# Patient Record
Sex: Male | Born: 1950 | Race: Black or African American | Hispanic: No | Smoking: Former smoker
Health system: Southern US, Community
[De-identification: ages and names within clinical notes are randomized; demographics above are authoritative.]

## PROBLEM LIST (undated history)

## (undated) DIAGNOSIS — M79673 Pain in unspecified foot: Secondary | ICD-10-CM

## (undated) DIAGNOSIS — F1010 Alcohol abuse, uncomplicated: Secondary | ICD-10-CM

## (undated) DIAGNOSIS — I739 Peripheral vascular disease, unspecified: Secondary | ICD-10-CM

## (undated) DIAGNOSIS — M25519 Pain in unspecified shoulder: Secondary | ICD-10-CM

## (undated) DIAGNOSIS — R05 Cough: Secondary | ICD-10-CM

## (undated) DIAGNOSIS — Z227 Latent tuberculosis: Secondary | ICD-10-CM

## (undated) DIAGNOSIS — R053 Chronic cough: Secondary | ICD-10-CM

## (undated) DIAGNOSIS — M129 Arthropathy, unspecified: Secondary | ICD-10-CM

## (undated) DIAGNOSIS — M545 Low back pain, unspecified: Secondary | ICD-10-CM

## (undated) DIAGNOSIS — I10 Essential (primary) hypertension: Secondary | ICD-10-CM

## (undated) DIAGNOSIS — E785 Hyperlipidemia, unspecified: Secondary | ICD-10-CM

## (undated) HISTORY — DX: Low back pain: M54.5

## (undated) HISTORY — DX: Cough: R05

## (undated) HISTORY — DX: Chronic cough: R05.3

## (undated) HISTORY — DX: Latent tuberculosis: Z22.7

## (undated) HISTORY — DX: Hyperlipidemia, unspecified: E78.5

## (undated) HISTORY — DX: Pain in unspecified foot: M79.673

## (undated) HISTORY — PX: LAMINECTOMY: SHX219

## (undated) HISTORY — DX: Peripheral vascular disease, unspecified: I73.9

## (undated) HISTORY — DX: Low back pain, unspecified: M54.50

## (undated) HISTORY — DX: Arthropathy, unspecified: M12.9

## (undated) HISTORY — DX: Alcohol abuse, uncomplicated: F10.10

## (undated) HISTORY — PX: HAMMER TOE SURGERY: SHX385

## (undated) HISTORY — DX: Pain in unspecified shoulder: M25.519

## (undated) HISTORY — PX: APPENDECTOMY: SHX54

## (undated) HISTORY — DX: Essential (primary) hypertension: I10

---

## 1998-10-26 ENCOUNTER — Encounter: Payer: Self-pay | Admitting: Specialist

## 1998-10-26 ENCOUNTER — Encounter (INDEPENDENT_AMBULATORY_CARE_PROVIDER_SITE_OTHER): Payer: Self-pay | Admitting: Internal Medicine

## 1998-10-31 ENCOUNTER — Encounter (INDEPENDENT_AMBULATORY_CARE_PROVIDER_SITE_OTHER): Payer: Self-pay

## 1998-10-31 ENCOUNTER — Encounter: Payer: Self-pay | Admitting: Specialist

## 1998-10-31 ENCOUNTER — Observation Stay (HOSPITAL_COMMUNITY): Admission: RE | Admit: 1998-10-31 | Discharge: 1998-11-01 | Payer: Self-pay | Admitting: Specialist

## 1999-03-28 ENCOUNTER — Encounter: Payer: Self-pay | Admitting: Specialist

## 1999-03-28 ENCOUNTER — Observation Stay (HOSPITAL_COMMUNITY): Admission: RE | Admit: 1999-03-28 | Discharge: 1999-03-29 | Payer: Self-pay | Admitting: Specialist

## 1999-03-28 ENCOUNTER — Encounter (INDEPENDENT_AMBULATORY_CARE_PROVIDER_SITE_OTHER): Payer: Self-pay

## 1999-10-25 ENCOUNTER — Ambulatory Visit (HOSPITAL_COMMUNITY): Admission: RE | Admit: 1999-10-25 | Discharge: 1999-10-25 | Payer: Self-pay | Admitting: Specialist

## 2002-06-08 ENCOUNTER — Encounter: Admission: RE | Admit: 2002-06-08 | Discharge: 2002-06-08 | Payer: Self-pay | Admitting: Internal Medicine

## 2002-06-11 ENCOUNTER — Encounter: Admission: RE | Admit: 2002-06-11 | Discharge: 2002-06-11 | Payer: Self-pay | Admitting: Internal Medicine

## 2002-07-08 ENCOUNTER — Encounter: Admission: RE | Admit: 2002-07-08 | Discharge: 2002-07-08 | Payer: Self-pay | Admitting: Internal Medicine

## 2002-10-20 ENCOUNTER — Encounter: Admission: RE | Admit: 2002-10-20 | Discharge: 2002-10-20 | Payer: Self-pay | Admitting: Internal Medicine

## 2003-01-12 ENCOUNTER — Encounter: Admission: RE | Admit: 2003-01-12 | Discharge: 2003-01-12 | Payer: Self-pay | Admitting: Internal Medicine

## 2003-02-04 ENCOUNTER — Encounter: Admission: RE | Admit: 2003-02-04 | Discharge: 2003-02-04 | Payer: Self-pay | Admitting: Internal Medicine

## 2003-06-17 ENCOUNTER — Encounter: Admission: RE | Admit: 2003-06-17 | Discharge: 2003-06-17 | Payer: Self-pay | Admitting: Internal Medicine

## 2003-10-01 ENCOUNTER — Ambulatory Visit: Payer: Self-pay | Admitting: Internal Medicine

## 2003-10-05 ENCOUNTER — Ambulatory Visit: Payer: Self-pay | Admitting: Internal Medicine

## 2004-01-26 ENCOUNTER — Ambulatory Visit (HOSPITAL_COMMUNITY): Admission: RE | Admit: 2004-01-26 | Discharge: 2004-01-26 | Payer: Self-pay | Admitting: Internal Medicine

## 2004-01-26 ENCOUNTER — Ambulatory Visit: Payer: Self-pay | Admitting: Internal Medicine

## 2004-02-29 ENCOUNTER — Ambulatory Visit: Payer: Self-pay | Admitting: Internal Medicine

## 2004-03-29 ENCOUNTER — Emergency Department (HOSPITAL_COMMUNITY): Admission: EM | Admit: 2004-03-29 | Discharge: 2004-03-30 | Payer: Self-pay | Admitting: Emergency Medicine

## 2004-04-24 ENCOUNTER — Encounter: Admission: RE | Admit: 2004-04-24 | Discharge: 2004-04-24 | Payer: Self-pay | Admitting: Family Medicine

## 2004-05-22 ENCOUNTER — Ambulatory Visit (HOSPITAL_COMMUNITY): Admission: RE | Admit: 2004-05-22 | Discharge: 2004-05-23 | Payer: Self-pay | Admitting: Orthopaedic Surgery

## 2004-07-04 ENCOUNTER — Ambulatory Visit: Payer: Self-pay | Admitting: Internal Medicine

## 2004-09-22 ENCOUNTER — Ambulatory Visit: Payer: Self-pay | Admitting: Internal Medicine

## 2005-06-27 ENCOUNTER — Ambulatory Visit: Payer: Self-pay | Admitting: Internal Medicine

## 2005-07-16 ENCOUNTER — Emergency Department (HOSPITAL_COMMUNITY): Admission: EM | Admit: 2005-07-16 | Discharge: 2005-07-16 | Payer: Self-pay | Admitting: Family Medicine

## 2005-08-03 ENCOUNTER — Ambulatory Visit: Payer: Self-pay | Admitting: Internal Medicine

## 2005-10-22 ENCOUNTER — Emergency Department (HOSPITAL_COMMUNITY): Admission: EM | Admit: 2005-10-22 | Discharge: 2005-10-22 | Payer: Self-pay | Admitting: Emergency Medicine

## 2006-01-11 ENCOUNTER — Encounter (INDEPENDENT_AMBULATORY_CARE_PROVIDER_SITE_OTHER): Payer: Self-pay | Admitting: *Deleted

## 2006-01-11 ENCOUNTER — Ambulatory Visit (HOSPITAL_COMMUNITY): Admission: RE | Admit: 2006-01-11 | Discharge: 2006-01-11 | Payer: Self-pay | Admitting: Internal Medicine

## 2006-01-11 ENCOUNTER — Ambulatory Visit: Payer: Self-pay | Admitting: Internal Medicine

## 2006-01-11 LAB — CONVERTED CEMR LAB
Basophils Absolute: 0 K/uL
Basophils Relative: 0 %
Eosinophils Relative: 0 %
HCT: 37.3 % — ABNORMAL LOW
Hemoglobin: 12.7 g/dL — ABNORMAL LOW
Lymphocytes Relative: 35 %
Lymphs Abs: 3.2 K/uL — ABNORMAL HIGH
MCHC: 34 g/dL
MCV: 89.4 fL
Monocytes Absolute: 0.8 K/uL — ABNORMAL HIGH
Monocytes Relative: 8 %
Neutro Abs: 5.2 K/uL
Neutrophils Relative %: 56 %
Platelets: 228 K/uL
RBC: 4.17 M/uL — ABNORMAL LOW
RDW: 16.9 % — ABNORMAL HIGH
TSH: 1.015 u[IU]/mL
WBC: 9.3 10*3/microliter

## 2006-01-18 ENCOUNTER — Encounter (INDEPENDENT_AMBULATORY_CARE_PROVIDER_SITE_OTHER): Payer: Self-pay | Admitting: *Deleted

## 2006-01-18 ENCOUNTER — Ambulatory Visit: Payer: Self-pay | Admitting: Internal Medicine

## 2006-01-18 LAB — CONVERTED CEMR LAB
Ferritin: 249 ng/mL (ref 22–322)
Retic Count, Absolute: 38.4 (ref 19.0–186.0)
Retic Ct Pct: 0.9 % (ref 0.4–3.1)
TIBC: 291 ug/dL (ref 215–435)

## 2006-06-12 ENCOUNTER — Encounter (INDEPENDENT_AMBULATORY_CARE_PROVIDER_SITE_OTHER): Payer: Self-pay | Admitting: Internal Medicine

## 2006-06-12 ENCOUNTER — Ambulatory Visit: Payer: Self-pay | Admitting: Internal Medicine

## 2006-06-12 ENCOUNTER — Ambulatory Visit (HOSPITAL_COMMUNITY): Admission: RE | Admit: 2006-06-12 | Discharge: 2006-06-12 | Payer: Self-pay | Admitting: Internal Medicine

## 2006-06-12 DIAGNOSIS — E785 Hyperlipidemia, unspecified: Secondary | ICD-10-CM

## 2006-06-12 DIAGNOSIS — R9431 Abnormal electrocardiogram [ECG] [EKG]: Secondary | ICD-10-CM

## 2006-06-12 DIAGNOSIS — I10 Essential (primary) hypertension: Secondary | ICD-10-CM

## 2006-06-12 DIAGNOSIS — M545 Low back pain: Secondary | ICD-10-CM

## 2006-06-14 ENCOUNTER — Ambulatory Visit (HOSPITAL_COMMUNITY): Admission: RE | Admit: 2006-06-14 | Discharge: 2006-06-14 | Payer: Self-pay | Admitting: Hospitalist

## 2006-06-14 ENCOUNTER — Encounter (INDEPENDENT_AMBULATORY_CARE_PROVIDER_SITE_OTHER): Payer: Self-pay | Admitting: Hospitalist

## 2006-06-26 ENCOUNTER — Telehealth (INDEPENDENT_AMBULATORY_CARE_PROVIDER_SITE_OTHER): Payer: Self-pay | Admitting: Pharmacy Technician

## 2006-06-29 ENCOUNTER — Emergency Department (HOSPITAL_COMMUNITY): Admission: EM | Admit: 2006-06-29 | Discharge: 2006-06-29 | Payer: Self-pay | Admitting: Family Medicine

## 2006-07-02 ENCOUNTER — Telehealth (INDEPENDENT_AMBULATORY_CARE_PROVIDER_SITE_OTHER): Payer: Self-pay | Admitting: *Deleted

## 2006-07-11 ENCOUNTER — Encounter (INDEPENDENT_AMBULATORY_CARE_PROVIDER_SITE_OTHER): Payer: Self-pay | Admitting: Internal Medicine

## 2006-07-11 ENCOUNTER — Ambulatory Visit: Payer: Self-pay | Admitting: Internal Medicine

## 2006-07-11 LAB — CONVERTED CEMR LAB
Cholesterol: 157 mg/dL (ref 0–200)
HDL: 32 mg/dL — ABNORMAL LOW (ref >39)
LDL Cholesterol: 97 mg/dL (ref 0–99)
Total CHOL/HDL Ratio: 4.9
Triglycerides: 141 mg/dL (ref <150)
VLDL: 28 mg/dL (ref 0–40)

## 2006-07-19 ENCOUNTER — Ambulatory Visit: Payer: Self-pay | Admitting: Internal Medicine

## 2006-07-23 ENCOUNTER — Ambulatory Visit: Payer: Self-pay | Admitting: Gastroenterology

## 2006-08-06 ENCOUNTER — Encounter (INDEPENDENT_AMBULATORY_CARE_PROVIDER_SITE_OTHER): Payer: Self-pay | Admitting: *Deleted

## 2006-08-06 ENCOUNTER — Ambulatory Visit: Payer: Self-pay | Admitting: Gastroenterology

## 2006-08-06 ENCOUNTER — Encounter: Payer: Self-pay | Admitting: Gastroenterology

## 2006-08-12 ENCOUNTER — Ambulatory Visit: Payer: Self-pay | Admitting: Hospitalist

## 2006-08-12 ENCOUNTER — Encounter (INDEPENDENT_AMBULATORY_CARE_PROVIDER_SITE_OTHER): Payer: Self-pay | Admitting: *Deleted

## 2006-08-12 LAB — CONVERTED CEMR LAB
CO2: 22 meq/L (ref 19–32)
Creatinine, Ser: 0.86 mg/dL (ref 0.40–1.50)
Sodium: 139 meq/L (ref 135–145)
Total Bilirubin: 0.3 mg/dL (ref 0.3–1.2)

## 2006-08-20 ENCOUNTER — Telehealth (INDEPENDENT_AMBULATORY_CARE_PROVIDER_SITE_OTHER): Payer: Self-pay | Admitting: Pharmacy Technician

## 2006-09-12 ENCOUNTER — Telehealth: Payer: Self-pay | Admitting: *Deleted

## 2006-10-31 ENCOUNTER — Telehealth (INDEPENDENT_AMBULATORY_CARE_PROVIDER_SITE_OTHER): Payer: Self-pay | Admitting: Hospitalist

## 2006-11-14 ENCOUNTER — Telehealth: Payer: Self-pay | Admitting: *Deleted

## 2006-11-15 ENCOUNTER — Telehealth: Payer: Self-pay | Admitting: *Deleted

## 2006-11-18 ENCOUNTER — Telehealth: Payer: Self-pay | Admitting: *Deleted

## 2006-12-10 ENCOUNTER — Ambulatory Visit (HOSPITAL_COMMUNITY): Admission: RE | Admit: 2006-12-10 | Discharge: 2006-12-10 | Payer: Self-pay | Admitting: *Deleted

## 2006-12-10 ENCOUNTER — Ambulatory Visit: Payer: Self-pay | Admitting: *Deleted

## 2006-12-10 DIAGNOSIS — I739 Peripheral vascular disease, unspecified: Secondary | ICD-10-CM

## 2006-12-12 ENCOUNTER — Telehealth: Payer: Self-pay | Admitting: *Deleted

## 2006-12-13 ENCOUNTER — Emergency Department (HOSPITAL_COMMUNITY): Admission: EM | Admit: 2006-12-13 | Discharge: 2006-12-13 | Payer: Self-pay | Admitting: Emergency Medicine

## 2006-12-17 ENCOUNTER — Ambulatory Visit: Payer: Self-pay | Admitting: Vascular Surgery

## 2006-12-17 ENCOUNTER — Ambulatory Visit (HOSPITAL_COMMUNITY): Admission: RE | Admit: 2006-12-17 | Discharge: 2006-12-17 | Payer: Self-pay | Admitting: *Deleted

## 2007-01-01 ENCOUNTER — Telehealth: Payer: Self-pay | Admitting: Infectious Diseases

## 2007-02-26 ENCOUNTER — Telehealth: Payer: Self-pay | Admitting: Infectious Disease

## 2007-05-02 ENCOUNTER — Telehealth (INDEPENDENT_AMBULATORY_CARE_PROVIDER_SITE_OTHER): Payer: Self-pay | Admitting: *Deleted

## 2007-06-02 ENCOUNTER — Ambulatory Visit (HOSPITAL_COMMUNITY): Admission: RE | Admit: 2007-06-02 | Discharge: 2007-06-02 | Payer: Self-pay | Admitting: Internal Medicine

## 2007-06-02 ENCOUNTER — Ambulatory Visit: Payer: Self-pay | Admitting: Internal Medicine

## 2007-06-02 ENCOUNTER — Encounter: Payer: Self-pay | Admitting: Internal Medicine

## 2007-06-02 DIAGNOSIS — R079 Chest pain, unspecified: Secondary | ICD-10-CM | POA: Insufficient documentation

## 2007-06-02 LAB — CONVERTED CEMR LAB: Relative Index: 0.8 (ref 0.0–2.5)

## 2007-06-06 ENCOUNTER — Ambulatory Visit (HOSPITAL_COMMUNITY): Admission: RE | Admit: 2007-06-06 | Discharge: 2007-06-06 | Payer: Self-pay | Admitting: Internal Medicine

## 2007-06-10 ENCOUNTER — Ambulatory Visit (HOSPITAL_COMMUNITY): Admission: RE | Admit: 2007-06-10 | Discharge: 2007-06-10 | Payer: Self-pay | Admitting: Internal Medicine

## 2007-06-10 ENCOUNTER — Encounter: Payer: Self-pay | Admitting: Internal Medicine

## 2007-06-17 ENCOUNTER — Telehealth (INDEPENDENT_AMBULATORY_CARE_PROVIDER_SITE_OTHER): Payer: Self-pay | Admitting: *Deleted

## 2007-07-15 ENCOUNTER — Ambulatory Visit: Payer: Self-pay | Admitting: Cardiology

## 2007-07-16 ENCOUNTER — Telehealth (INDEPENDENT_AMBULATORY_CARE_PROVIDER_SITE_OTHER): Payer: Self-pay | Admitting: *Deleted

## 2007-07-23 ENCOUNTER — Encounter (INDEPENDENT_AMBULATORY_CARE_PROVIDER_SITE_OTHER): Payer: Self-pay | Admitting: Internal Medicine

## 2007-07-23 ENCOUNTER — Ambulatory Visit: Payer: Self-pay

## 2007-07-23 ENCOUNTER — Encounter: Payer: Self-pay | Admitting: Internal Medicine

## 2007-07-27 ENCOUNTER — Emergency Department (HOSPITAL_COMMUNITY): Admission: EM | Admit: 2007-07-27 | Discharge: 2007-07-27 | Payer: Self-pay | Admitting: Emergency Medicine

## 2007-08-08 ENCOUNTER — Ambulatory Visit: Payer: Self-pay | Admitting: Infectious Diseases

## 2007-08-08 ENCOUNTER — Encounter (INDEPENDENT_AMBULATORY_CARE_PROVIDER_SITE_OTHER): Payer: Self-pay | Admitting: Internal Medicine

## 2007-08-08 LAB — CONVERTED CEMR LAB
ALT: 44 units/L (ref 0–53)
Albumin: 4.7 g/dL (ref 3.5–5.2)
BUN: 13 mg/dL (ref 6–23)
CO2: 23 meq/L (ref 19–32)
Calcium: 10 mg/dL (ref 8.4–10.5)
Chloride: 105 meq/L (ref 96–112)
Cholesterol: 181 mg/dL (ref 0–200)
Glucose, Bld: 101 mg/dL — ABNORMAL HIGH (ref 70–99)
LDL Cholesterol: 114 mg/dL — ABNORMAL HIGH (ref 0–99)
Total Bilirubin: 0.2 mg/dL — ABNORMAL LOW (ref 0.3–1.2)
Total Protein: 7.6 g/dL (ref 6.0–8.3)

## 2007-08-22 ENCOUNTER — Encounter (INDEPENDENT_AMBULATORY_CARE_PROVIDER_SITE_OTHER): Payer: Self-pay | Admitting: Internal Medicine

## 2007-08-22 ENCOUNTER — Ambulatory Visit: Payer: Self-pay | Admitting: Internal Medicine

## 2007-08-27 ENCOUNTER — Ambulatory Visit: Payer: Self-pay | Admitting: Cardiovascular Disease

## 2007-09-10 ENCOUNTER — Telehealth (INDEPENDENT_AMBULATORY_CARE_PROVIDER_SITE_OTHER): Payer: Self-pay | Admitting: Internal Medicine

## 2007-10-01 ENCOUNTER — Ambulatory Visit: Payer: Self-pay | Admitting: Cardiology

## 2007-10-27 ENCOUNTER — Emergency Department (HOSPITAL_COMMUNITY): Admission: EM | Admit: 2007-10-27 | Discharge: 2007-10-27 | Payer: Self-pay | Admitting: Emergency Medicine

## 2007-11-05 ENCOUNTER — Emergency Department (HOSPITAL_COMMUNITY): Admission: EM | Admit: 2007-11-05 | Discharge: 2007-11-05 | Payer: Self-pay | Admitting: Emergency Medicine

## 2007-11-24 ENCOUNTER — Emergency Department (HOSPITAL_COMMUNITY): Admission: EM | Admit: 2007-11-24 | Discharge: 2007-11-24 | Payer: Self-pay | Admitting: Family Medicine

## 2008-01-05 ENCOUNTER — Telehealth (INDEPENDENT_AMBULATORY_CARE_PROVIDER_SITE_OTHER): Payer: Self-pay | Admitting: Internal Medicine

## 2008-01-06 ENCOUNTER — Ambulatory Visit: Payer: Self-pay | Admitting: Cardiovascular Disease

## 2008-01-15 ENCOUNTER — Emergency Department (HOSPITAL_COMMUNITY): Admission: EM | Admit: 2008-01-15 | Discharge: 2008-01-15 | Payer: Self-pay | Admitting: Emergency Medicine

## 2008-08-31 ENCOUNTER — Telehealth (INDEPENDENT_AMBULATORY_CARE_PROVIDER_SITE_OTHER): Payer: Self-pay | Admitting: Internal Medicine

## 2009-03-09 ENCOUNTER — Telehealth (INDEPENDENT_AMBULATORY_CARE_PROVIDER_SITE_OTHER): Payer: Self-pay | Admitting: Internal Medicine

## 2009-03-24 ENCOUNTER — Ambulatory Visit: Payer: Self-pay | Admitting: Internal Medicine

## 2009-03-26 LAB — CONVERTED CEMR LAB
ALT: 16 units/L (ref 0–53)
Albumin: 4.7 g/dL (ref 3.5–5.2)
Alkaline Phosphatase: 66 units/L (ref 39–117)
Cholesterol: 173 mg/dL (ref 0–200)
Glucose, Bld: 85 mg/dL (ref 70–99)
HDL: 33 mg/dL — ABNORMAL LOW (ref >39)
Triglycerides: 120 mg/dL (ref <150)

## 2009-04-18 ENCOUNTER — Ambulatory Visit: Payer: Self-pay | Admitting: Internal Medicine

## 2009-04-18 LAB — CONVERTED CEMR LAB

## 2009-05-02 DIAGNOSIS — F1011 Alcohol abuse, in remission: Secondary | ICD-10-CM

## 2009-05-03 ENCOUNTER — Ambulatory Visit: Payer: Self-pay | Admitting: Cardiovascular Disease

## 2009-05-09 ENCOUNTER — Encounter: Payer: Self-pay | Admitting: Cardiovascular Disease

## 2009-05-09 ENCOUNTER — Ambulatory Visit: Payer: Self-pay

## 2009-05-10 ENCOUNTER — Encounter: Payer: Self-pay | Admitting: Cardiovascular Disease

## 2009-05-10 ENCOUNTER — Telehealth (INDEPENDENT_AMBULATORY_CARE_PROVIDER_SITE_OTHER): Payer: Self-pay | Admitting: *Deleted

## 2009-05-16 ENCOUNTER — Telehealth: Payer: Self-pay | Admitting: Cardiovascular Disease

## 2009-05-18 ENCOUNTER — Ambulatory Visit: Payer: Self-pay | Admitting: Internal Medicine

## 2009-06-01 ENCOUNTER — Telehealth (INDEPENDENT_AMBULATORY_CARE_PROVIDER_SITE_OTHER): Payer: Self-pay | Admitting: Internal Medicine

## 2009-06-24 ENCOUNTER — Ambulatory Visit: Payer: Self-pay | Admitting: Infectious Disease

## 2009-06-28 LAB — CONVERTED CEMR LAB
AST: 15 units/L (ref 0–37)
Alkaline Phosphatase: 64 units/L (ref 39–117)
CO2: 19 meq/L (ref 19–32)
Chloride: 105 meq/L (ref 96–112)
Cholesterol: 144 mg/dL (ref 0–200)
Creatinine, Ser: 0.93 mg/dL (ref 0.40–1.50)
Glucose, Bld: 90 mg/dL (ref 70–99)
HDL: 39 mg/dL — ABNORMAL LOW (ref >39)
Total Bilirubin: 0.4 mg/dL (ref 0.3–1.2)
VLDL: 16 mg/dL (ref 0–40)

## 2009-07-05 ENCOUNTER — Ambulatory Visit: Payer: Self-pay | Admitting: Family Medicine

## 2009-07-05 DIAGNOSIS — M7500 Adhesive capsulitis of unspecified shoulder: Secondary | ICD-10-CM

## 2009-07-06 ENCOUNTER — Encounter: Admission: RE | Admit: 2009-07-06 | Discharge: 2009-07-27 | Payer: Self-pay | Admitting: Family Medicine

## 2009-07-08 ENCOUNTER — Telehealth (INDEPENDENT_AMBULATORY_CARE_PROVIDER_SITE_OTHER): Payer: Self-pay | Admitting: Internal Medicine

## 2009-07-15 ENCOUNTER — Telehealth (INDEPENDENT_AMBULATORY_CARE_PROVIDER_SITE_OTHER): Payer: Self-pay | Admitting: Internal Medicine

## 2009-07-19 ENCOUNTER — Ambulatory Visit: Payer: Self-pay | Admitting: Internal Medicine

## 2009-07-19 ENCOUNTER — Telehealth: Payer: Self-pay | Admitting: Cardiovascular Disease

## 2009-08-02 ENCOUNTER — Ambulatory Visit: Payer: Self-pay | Admitting: Family Medicine

## 2009-08-11 ENCOUNTER — Ambulatory Visit: Payer: Self-pay | Admitting: Internal Medicine

## 2009-08-11 LAB — CONVERTED CEMR LAB
Calcium: 9.6 mg/dL (ref 8.4–10.5)
Glucose, Bld: 92 mg/dL (ref 70–99)
HDL goal, serum: 40 mg/dL
LDL Goal: 100 mg/dL
Sodium: 140 meq/L (ref 135–145)

## 2009-09-15 ENCOUNTER — Encounter: Payer: Self-pay | Admitting: Internal Medicine

## 2009-09-15 ENCOUNTER — Ambulatory Visit: Payer: Self-pay | Admitting: Internal Medicine

## 2009-09-30 ENCOUNTER — Emergency Department (HOSPITAL_COMMUNITY): Admission: EM | Admit: 2009-09-30 | Discharge: 2009-09-30 | Payer: Self-pay | Admitting: Emergency Medicine

## 2009-10-27 ENCOUNTER — Ambulatory Visit: Payer: Self-pay | Admitting: Internal Medicine

## 2009-11-02 ENCOUNTER — Telehealth: Payer: Self-pay | Admitting: Cardiovascular Disease

## 2010-01-12 ENCOUNTER — Ambulatory Visit: Payer: Self-pay | Admitting: Internal Medicine

## 2010-02-01 ENCOUNTER — Telehealth (INDEPENDENT_AMBULATORY_CARE_PROVIDER_SITE_OTHER): Payer: Self-pay | Admitting: *Deleted

## 2010-02-26 LAB — CONVERTED CEMR LAB
ALT: 30 units/L (ref 0–53)
AST: 21 units/L (ref 0–37)
Albumin: 4.5 g/dL (ref 3.5–5.2)
Alkaline Phosphatase: 67 units/L (ref 39–117)
Amphetamine Screen, Ur: NEGATIVE
BUN: 14 mg/dL (ref 6–23)
Barbiturate Quant, Ur: NEGATIVE
Basophils Absolute: 0 10*3/uL (ref 0.0–0.1)
Basophils Relative: 0 % (ref 0–1)
Benzodiazepines.: NEGATIVE
Bilirubin Urine: NEGATIVE
CO2: 25 meq/L (ref 19–32)
Calcium: 9.5 mg/dL (ref 8.4–10.5)
Chloride: 103 meq/L (ref 96–112)
Cocaine Metabolites: NEGATIVE
Creatinine, Ser: 0.98 mg/dL (ref 0.40–1.50)
Creatinine, Urine: 276.1 mg/dL
Creatinine,U: 276 mg/dL
Eosinophils Absolute: 0.1 10*3/uL (ref 0.0–0.7)
Eosinophils Relative: 1 % (ref 0–5)
Glucose, Bld: 84 mg/dL (ref 70–99)
HCT: 42.3 % (ref 39.0–52.0)
Hemoglobin, Urine: NEGATIVE
Hemoglobin: 14.2 g/dL (ref 13.0–17.0)
Ketones, ur: NEGATIVE mg/dL
Leukocytes, UA: NEGATIVE
Lymphocytes Relative: 35 % (ref 12–46)
Lymphs Abs: 4.1 10*3/uL — ABNORMAL HIGH (ref 0.7–3.3)
MCHC: 33.6 g/dL (ref 30.0–36.0)
MCV: 89.6 fL (ref 78.0–100.0)
Marijuana Metabolite: POSITIVE — AB
Methadone: NEGATIVE
Microalb Creat Ratio: 3.2 mg/g (ref 0.0–30.0)
Microalb, Ur: 0.89 mg/dL (ref 0.00–1.89)
Monocytes Absolute: 0.8 10*3/uL — ABNORMAL HIGH (ref 0.2–0.7)
Monocytes Relative: 7 % (ref 3–11)
Neutro Abs: 6.6 10*3/uL (ref 1.7–7.7)
Neutrophils Relative %: 57 % (ref 43–77)
Nitrite: NEGATIVE
Opiates: NEGATIVE
Phencyclidine (PCP): NEGATIVE
Platelets: 203 10*3/uL (ref 150–400)
Potassium: 3.9 meq/L (ref 3.5–5.3)
Propoxyphene: NEGATIVE
Protein, ur: NEGATIVE mg/dL
RBC: 4.72 M/uL (ref 4.22–5.81)
RDW: 16.6 % — ABNORMAL HIGH (ref 11.5–14.0)
Sodium: 139 meq/L (ref 135–145)
Specific Gravity, Urine: 1.025 (ref 1.005–1.03)
Total Bilirubin: 0.3 mg/dL (ref 0.3–1.2)
Total Protein: 8 g/dL (ref 6.0–8.3)
Urine Glucose: NEGATIVE mg/dL
Urobilinogen, UA: 0.2 (ref 0.0–1.0)
WBC: 11.6 10*3/uL — ABNORMAL HIGH (ref 4.0–10.5)
pH: 6 (ref 5.0–8.0)

## 2010-03-02 NOTE — Assessment & Plan Note (Signed)
Summary: EST-1 MONTH F/U VISIT/CH   Vital Signs:  Patient profile:   60 year old male Height:      71 inches (180.34 cm) Weight:      172.5 pounds (78.41 kg) BMI:     24.15 Temp:     97.0 degrees F (36.11 degrees C) oral Pulse rate:   83 / minute BP sitting:   121 / 77  (right arm)  Vitals Entered By: Stanton Kidney Ditzler RN (Jun 24, 2009 10:06 AM) Is Patient Diabetic? No Pain Assessment Patient in pain? yes     Location: back and left shoulder Intensity: 6 Type: pain Onset of pain  1 + year Nutritional Status BMI of 19 -24 = normal Nutritional Status Detail appetite good  Have you ever been in a relationship where you felt threatened, hurt or afraid?denies   Does patient need assistance? Functional Status Self care Ambulation Normal Comments FU - discuss pain in back and left shoulder. Refill on meds and discuss Zocor dosage ? 80mg .   Primary Care Provider:  Lollie Sails MD   History of Present Illness: Stephen [name] is a 24 yo man with PMH as outlined in the EMR comes today for a f/u visit.   1. HTN: He is taking his norvasc taking regularly and is doing well.   2. HL: He is taking zocor 40 mg 2 pills once a day.   3. Leg claudication: It is stable with pletal and plavix.   4. Foot pain: This is being followed by his podiatrist.   5. Back and left shoulder pain: Pt endorses pain on the left shoulder for the past week and is constant. No trauma, fever, chills. It was sudden in onset. Moving the left arm makes the shoulder pain worse and tramadol helps some, but the pain recurs. No radiation of the pain. No numbness/tingling/weakness on the left arm. No neck pain.  He also has occasional back pain, but otherwise it is stable and it does not radiate down to his legs. No urine/bowel incontinence.   Depression History:      The patient denies a depressed mood most of the day and a diminished interest in his usual daily activities.         Preventive Screening-Counseling &  Management  Alcohol-Tobacco     Smoking Status: current     Smoking Cessation Counseling: yes     Year Started: occ pot     Year Quit: 5 YEARS   Caffeine-Diet-Exercise     Does Patient Exercise: yes     Type of exercise: WALKING     Times/week: AT TIMES  Current Medications (verified): 1)  Norvasc 10 Mg Tabs (Amlodipine Besylate) .... Take 1 Tablet By Mouth Once A Day For Blood Pressure 2)  Zocor 80 Mg Tabs (Simvastatin) .... Take 1 Pill By Mouth Daily. 3)  Ultram 50 Mg  Tabs (Tramadol Hcl) .... Take 1 Pill By Mouth As Needed For Pain Up To 6 Hourly. 4)  Pletal 100 Mg Tabs (Cilostazol) .... Take 1 Pill By Mouth Daily 5)  Plavix 75 Mg Tabs (Clopidogrel Bisulfate) .... Take One Tablet By Mouth Daily  Allergies: 1)  ! Asa  Review of Systems      See HPI  Physical Exam  Mouth:  pharynx pink and moist.   Lungs:  normal breath sounds, no crackles, and no wheezes.   Heart:  normal rate, regular rhythm, and no murmur.   Msk:  Left Shoulder: No redness, swelling, deformity. ROM  is WNL except decreased abduction which is limited to 90 degrees. Muscle strength, both proximal and distal and fine touch sensation WNL.  Neurologic:  alert & oriented X3.     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 He is taking his pletal and plavix.   Problem # 2:  HYPERLIPIDEMIA (ICD-272.4) Cont. following and check follwings.  His updated medication list for this problem includes:    Zocor 80 Mg Tabs (Simvastatin) .Marland Kitchen... Take 1 pill by mouth daily.  Orders: T-Comprehensive Metabolic Panel [id number]) T-Lipid Profile [id number])  Problem # 3:  HYPERTENSION (ICD-401.9) BP great. Cont same.  His updated medication list for this problem includes:    Norvasc 10 Mg Tabs (Amlodipine besylate) .Marland Kitchen... Take 1 tablet by mouth once a day for blood pressure  Orders: T-Comprehensive Metabolic Panel ([id number]) T-Lipid Profile  ([id number])  Problem # 4:  SHOULDER PAIN, BILATERAL (ICD-719.41) Apparently pt had MRI done in the past and a/t Dr. Augusto Gamble note his plan was to refer him to an orthopedist, but pt denies seeing one. I think he has rotator cuff tear and plan is to refer to sports medicine.  His updated medication list for this problem includes:    Ultram 50 Mg Tabs (Tramadol hcl) .Marland Kitchen... Take 1 pill by mouth as needed for pain up to 6 hourly.  Orders: Sports Medicine (Sports Med)  Problem # 5:  LUMBAGO (ICD-724.2) His pain is stable with ultram.  His updated medication list for this problem includes:    Ultram 50 Mg Tabs (Tramadol hcl) .Marland Kitchen... Take 1 pill by mouth as needed for pain up to 6 hourly.  Complete Medication List: 1)  Norvasc 10 Mg Tabs (Amlodipine besylate) .... Take 1 tablet by mouth once a day for blood pressure 2)  Zocor 80 Mg Tabs (Simvastatin) .... Take 1 pill by mouth daily. 3)  Ultram 50 Mg Tabs (Tramadol hcl) .... Take 1 pill by mouth as needed for pain up to 6 hourly. 4)  Pletal 100 Mg Tabs (Cilostazol) .... Take 1 pill by mouth daily 5)  Plavix 75 Mg Tabs (Clopidogrel bisulfate) .... Take one tablet by mouth daily  Patient Instructions: 1)  Please schedule a follow-up appointment in 1 month. 2)  Limit your Sodium (Salt) to less than 2 grams a day(slightly less than 1/2 a teaspoon) to prevent fluid retention, swelling, or worsening of symptoms. 3)  It is important that you exercise regularly at least 20 minutes 5 times a week. If you develop chest pain, have severe difficulty breathing, or feel very tired , stop exercising immediately and seek medical attention. 4)  You need to lose weight. Consider a lower calorie diet and regular exercise.  5)  Check your feet each night for sore areas, calluses or signs of infection. 6)  Check your Blood Pressure regularly. If it is above: you should make an appointment. Prescriptions: ZOCOR 80 MG TABS (SIMVASTATIN) take 1 pill by mouth daily.   #30 x 0   Entered and Authorized by:   Jason Coop MD   Signed by:   Jason Coop MD on 06/24/2009   Method used:   Electronically to        [facility] (retail)       [address]       [place], [place]  [address]       Ph: [phone]       Fax:  [phone]   RxID:   [id number] NORVASC 10 MG TABS (AMLODIPINE BESYLATE) Take 1 tablet by mouth once a day for blood pressure  #30 x 0   Entered and Authorized by:   Jason Coop MD   Signed by:   Jason Coop MD on 06/24/2009   Method used:   Electronically to        [facility] (retail)       [address]       [place], [place]  [address]       Ph: [phone]       Fax: [phone]   RxID:   [id number] ULTRAM 50 MG  TABS (TRAMADOL HCL) take 1 pill by mouth as needed for pain up to 6 hourly.  #120 x 0   Entered and Authorized by:   Jason Coop MD   Signed by:   Jason Coop MD on 06/24/2009   Method used:   Electronically to        [facility] (retail)       [address]       [place], [place]  [address]       Ph: [phone]       Fax: [phone]   RxID:   [id number]  Process Orders Check Orders Results:     [facility]: Check successful Tests Sent for requisitioning (Jun 24, 2009 11:50 AM):     06/24/2009: [facility] -- T-Comprehensive Metabolic Panel [[id number]] (signed)     06/24/2009: [facility] -- T-Lipid Profile [id number] (signed)    Prevention & Chronic Care Immunizations   Influenza vaccine: Not documented   Influenza vaccine deferral: Refused  (03/24/2009)    Tetanus booster: Not documented   Td booster deferral: Deferred  (04/18/2009)    Pneumococcal vaccine: Not documented   Pneumococcal vaccine deferral: Deferred  (04/18/2009)    H. zoster vaccine: Not documented   H. zoster vaccine deferral: Deferred   (04/18/2009)  Colorectal Screening   Hemoccult: Not documented   Hemoccult action/deferral: Deferred  (04/18/2009)    Colonoscopy: Not documented   Colonoscopy action/deferral: GI referral  (04/18/2009)  Other Screening   PSA: Not documented   PSA action/deferral: Discussion deferred  (04/18/2009)   Smoking status: current  (06/24/2009)   Smoking cessation counseling: yes  (06/24/2009)  Lipids   Total Cholesterol: 173  (03/24/2009)   Lipid panel action/deferral: Lipid Panel ordered   LDL: 116  (03/24/2009)   LDL Direct: Not documented   HDL: 33  (03/24/2009)   Triglycerides: 120  (03/24/2009)    SGOT (AST): 17  (03/24/2009)   SGPT (ALT): 16  (03/24/2009) CMP ordered    Alkaline phosphatase: 66  (03/24/2009)   Total bilirubin: 0.3  (03/24/2009)    Lipid flowsheet reviewed?: Yes   Progress toward LDL goal: Unchanged  Hypertension   Last Blood Pressure: 121 / 77  (06/24/2009)   Serum creatinine: 0.98  (03/24/2009)   Serum potassium 3.9  (03/24/2009) CMP ordered     Hypertension flowsheet reviewed?: Yes   Progress toward BP goal: At goal  Self-Management Support :   Personal Goals (by the next clinic visit) :      Personal blood pressure goal: 140/90  (03/24/2009)     Personal LDL goal: 100  (03/24/2009)    Patient will work on the following items until the next clinic visit to  reach self-care goals:     Medications and monitoring: take my medicines every day, bring all of my medications to every visit  (06/24/2009)     Eating: eat more vegetables, use fresh or frozen vegetables, eat fruit for snacks and desserts, limit or avoid alcohol  (06/24/2009)     Activity: take a 30 minute walk every day, park at the far end of the parking lot  (06/24/2009)    Hypertension self-management support: Written self-care plan, Education handout, Resources for patients handout  (06/24/2009)   Hypertension self-care plan printed.   Hypertension education handout printed    Lipid  self-management support: Written self-care plan, Education handout, Resources for patients handout  (06/24/2009)   Lipid self-care plan printed.   Lipid education handout printed      Resource handout printed.  Process Orders Check Orders Results:     [facility]: Check successful Tests Sent for requisitioning (Jun 24, 2009 11:50 AM):     06/24/2009: [facility] -- T-Comprehensive Metabolic Panel [[id number]] (signed)     06/24/2009: [facility] -- T-Lipid Profile [id number] (signed)     Patient Instructions: 1)  Please schedule a follow-up appointment in 1 month. 2)  Limit your Sodium (Salt) to less than 2 grams a day(slightly less than 1/2 a teaspoon) to prevent fluid retention, swelling, or worsening of symptoms. 3)  It is important that you exercise regularly at least 20 minutes 5 times a week. If you develop chest pain, have severe difficulty breathing, or feel very tired , stop exercising immediately and seek medical attention. 4)  You need to lose weight. Consider a lower calorie diet and regular exercise.  5)  Check your feet each night for sore areas, calluses or signs of infection. 6)  Check your Blood Pressure regularly. If it is above: you should make an appointment.

## 2010-03-02 NOTE — Progress Notes (Signed)
Summary: plavix  Phone Note Outgoing Call   Call placed by: Julieta Gutting, RN, BSN,  May 16, 2009 5:26 PM Call placed to: Patient Summary of Call: I spoke with the pt and made him aware that he was approved for the [facility] assistance program.  The pt's plavix arrived and has been placed at the front desk for pick-up.  I instructed the pt to take one tablet daily.  The pt was also made aware that he needs to call the office when he had only 30 tablets left so this medication can be ordered.  The pt agrees. Order #[id number], Customer  PO  number [id number]. Initial call taken by: Julieta Gutting, RN, BSN,  May 16, 2009 5:26 PM    New/Updated Medications: PLAVIX 75 MG TABS (CLOPIDOGREL BISULFATE) Take one tablet by mouth daily

## 2010-03-02 NOTE — Assessment & Plan Note (Signed)
Summary: RA/NEEDS 1 MONTH F/U VISIT/CH   Vital Signs:  Patient profile:   60 year old male Height:      71 inches (180.34 cm) Weight:      174.2 pounds (79.18 kg) BMI:     24.38 Temp:     97.4 degrees F (36.33 degrees C) oral Pulse rate:   73 / minute BP sitting:   179 / 87  (right arm)  Vitals Entered By: Stanton Kidney Ditzler RN (August 11, 2009 1:51 PM) CC: Back Pain, Hypertension Management, Lipid Management Is Patient Diabetic? No Pain Assessment Patient in pain? yes     Location: h/a Intensity: 5 Type: spasms Onset of pain  since 07/24/09 Nutritional Status BMI of 19 -24 = normal Nutritional Status Detail appetite good  Have you ever been in a relationship where you felt threatened, hurt or afraid?denies   Does patient need assistance? Functional Status Self care Ambulation Normal Comments FU and refills on meds - no BP med x 3 months.   Primary Care Provider:  Danelle Berry, MD  CC:  Back Pain, Hypertension Management, and Lipid Management.  History of Present Illness: Mr. Piercefield is a 60 yo man with PMH as outlined in the EMR comes today for a f/u visit.  He states he has no physical complaints.   The patient says he is out of several of his medications (for the past three months) because of insurance problems.    1. HTN: No medication for 3 months. The patient needs a refill of his norvasc 10mg .    2. HL: He is taking zocor 40 mg 1 pills once a day.   3. Leg claudication: It is stable with pletal and plavix.  The patient had a bad encounter with a staff member at Dr. Earmon Phoenix office and this has been weighing heavily on his mind.  He was very agitated because he felt like he was not listened to by the staff at Dr. Earmon Phoenix office.  He says that he went to the office to get more of his plavix and the encounter occured when he tried to clarify his medications.  The patient developed a headache after the encounter, and he said it has slowly subsided and feels much better after  talking about this today with me.  He is to follow-up with Dr. Excell Seltzer one year after his last appointment.  4. Foot pain: This is being followed by his podiatrist.   5. adhesive capsulitis of left shoulder:  Much improved with therapy; patient uses ultram for pain.   Depression History:      The patient denies a depressed mood most of the day and a diminished interest in his usual daily activities.         Hypertension History:      He complains of headache, but denies chest pain, palpitations, dyspnea with exertion, orthopnea, and peripheral edema.        Positive major cardiovascular risk factors include male age 17 years old or older, hyperlipidemia, and hypertension.  Negative major cardiovascular risk factors include non-tobacco-user status.        Positive history for target organ damage include peripheral vascular disease.    Lipid Management History:      Positive NCEP/ATP III risk factors include male age 30 years old or older, HDL cholesterol less than 40, hypertension, and peripheral vascular disease.  Negative NCEP/ATP III risk factors include non-tobacco-user status.  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) 2)  Shoulder Impingement Syndrome, Left  (ICD-726.2) 3)  Adhesive Capsulitis, Left  (ICD-726.0) 4)  Chest Pain, Intermittent  (ICD-786.50) 5)  Intermittent Claudication, Bilateral  (ICD-443.9) 6)  Hyperlipidemia  (ICD-272.4) 7)  Hypertension  (ICD-401.9) 8)  Abnormal Result, Function Study, Ekg  (ICD-794.31) 9)  Foot Pain, Bilateral  (ICD-729.5) 10)  Shoulder Pain, Bilateral  (ICD-719.41) 11)  Arthropathy Nos,  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 Take 1 Tablet By Mouth Once A Day For Blood Pressure 2)  Zocor 40 Mg Tabs (Simvastatin) .... Take 1 Pill By Mouth Daily. 3)  Ultram 50 Mg  Tabs (Tramadol Hcl) .... Take 1 Pill By Mouth As Needed For Pain Up To 6 Hourly. 4)  Pletal 100 Mg Tabs (Cilostazol) .... Take 1 Pill By Mouth Daily 5)  Plavix 75 Mg Tabs (Clopidogrel Bisulfate) .... Take One Tablet By Mouth Daily  Allergies: 1)  ! Asa  Social History: Smoking Status:  quit  Physical Exam  General:  NAD Ears:  TM intact and without erythema Mouth:  MMM, no uclers Neck:  supple, no thyroidmegaly Lungs:  CTAB, no increased WOB Heart:  RRR, no murmurs Abdomen:  +bs, soft, nt/nd Msk:  5/5 strength, full range of motion, including left shoulder Neurologic:  grossly intact sensation.  A+O x3.  gait normal.    Impression & Recommendations:  Problem # 1:  ADHESIVE CAPSULITIS, LEFT (ICD-726.0) Improved.  Patient is being followed by Dr. Dallas Schimke for this.  He has completed PT and continues his exercises at home.  He currently has full range of motion.  He uses Ultram for pain as needed.  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  The patient is followed by Dr. Excell Seltzer for his intermittent claudication.  He is currently prescribed plavix and pletal for this (he gets these medications from Dr. Excell Seltzer.)  Problem # 3:  HYPERLIPIDEMIA (ICD-272.4) The patient had his lipid panel last checked in May 2011 and it was at goal for him.  Continue statin at current dose.     His updated medication list for this problem includes:    Zocor 40 Mg Tabs (Simvastatin) .Marland Kitchen... Take 1 pill by mouth daily.  His updated medication list for this problem  includes:    Zocor 40 Mg Tabs (Simvastatin) .Marland Kitchen... Take 1 pill by mouth daily.  Problem # 4:  HYPERTENSION (ICD-401.9)  The patient has not taken his medications for 3 months because "the prescription never came".  His BP was 178/87 today.  A refill of his Norvasc with 5 refills was sent to the [facility] pharmacy of his choice today.  The patient's potassium at his last B-Met on 06/24/09 was 3.3.  This is low for Mr. Fedewa, as his previous K+ have been around 4.0.  We will recheck the B-Met today.  His  updated medication list for this problem includes:    Norvasc 10 Mg Tabs (Amlodipine besylate) .Marland Kitchen... Take 1 tablet by mouth once a day for blood pressure  His updated medication list for this problem includes:    Norvasc 10 Mg Tabs (Amlodipine besylate) .Marland Kitchen... Take 1 tablet by mouth once a day for blood pressure  Orders: T-Basic Metabolic Panel ([id number])  Problem # 5:  FOOT PAIN, BILATERAL (ICD-729.5) stable and unchanged.   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  I encouraged him to continue to abstain from drinking alcohol, and he expressed to me that he avoid any situation where he may be tempted.   Complete Medication List: 1)  Norvasc 10 Mg Tabs (Amlodipine besylate) .... Take 1 tablet by mouth once a day for blood pressure 2)  Zocor 40 Mg Tabs (Simvastatin) .... Take 1 pill by mouth daily. 3)  Ultram 50 Mg Tabs (Tramadol hcl) .... Take 1 pill by mouth as needed for pain up to 6 hourly. 4)  Pletal 100 Mg Tabs (Cilostazol) .... Take 1 pill by mouth daily 5)  Plavix 75 Mg Tabs (Clopidogrel bisulfate) .... Take one tablet by mouth daily  Hypertension Assessment/Plan:      The patient's hypertensive risk group is category C: Target organ damage and/or diabetes.  His calculated 10 year risk of coronary heart disease is 14 %.  Today's blood pressure is 179/87.  His blood pressure goal is < 140/90.  Lipid Assessment/Plan:      Based on NCEP/ATP III,  the patient's risk factor category is "history of coronary disease, peripheral vascular disease, cerebrovascular disease, or aortic aneurysm".  The patient's lipid goals are as follows: Total cholesterol goal is 200; LDL cholesterol goal is 100; HDL cholesterol goal is 40; Triglyceride goal is 150.     Patient Instructions: 1)  Before leaving the clinic today, please pass by the lab to have your blood drawn.  A refill of your blood pressure medication Norvasc has been sent to the [facility] pharmacy.  Please go by and pick it up today.   2)  I would like for you to follow-up with me in clinic in one month so that we can recheck your blood pressure and go over your blood work.  Check your Blood Pressure regularly. If it is above 175/100 you should make an appointment. 3)  It is important that you exercise regularly at least 20 minutes 5 times a week. If you develop chest pain, have severe difficulty breathing, or feel very tired , stop exercising immediately and seek medical attention. Prescriptions: NORVASC 10 MG TABS (AMLODIPINE BESYLATE) Take 1 tablet by mouth once a day for blood pressure  #30 x 5   Entered and Authorized by:   Danelle Berry, MD   Signed by:   Danelle Berry, MD on 08/11/2009   Method used:   Electronically to        [facility] [phone]* (retail)       [address]       [place], [place]  [address]       Ph: [phone]       Fax: [phone]   RxID:   [id number]  Process Orders Check Orders Results:     [facility]: Check successful Tests Sent for requisitioning (August 11, 2009 5:13 PM):     08/11/2009: [facility] -- T-Basic Metabolic Panel [id number] (signed)    Prevention & Chronic Care Immunizations  Influenza vaccine: Not documented   Influenza vaccine deferral: Refused  (03/24/2009)    Tetanus booster: Not documented   Td booster deferral: Deferred  (04/18/2009)    Pneumococcal vaccine: Not documented    Pneumococcal vaccine deferral: Deferred  (04/18/2009)    H. zoster vaccine: Not documented   H. zoster vaccine deferral: Deferred  (04/18/2009)  Colorectal Screening   Hemoccult: Not documented   Hemoccult action/deferral: Deferred  (04/18/2009)    Colonoscopy: Not documented   Colonoscopy action/deferral: GI referral  (04/18/2009)  Other Screening   PSA: Not documented   PSA action/deferral: Discussion deferred  (04/18/2009)   Smoking status: quit  (08/11/2009)  Lipids   Total Cholesterol: 144  (06/24/2009)   Lipid panel action/deferral: Lipid Panel ordered   LDL: 89  (06/24/2009)   LDL Direct: Not documented   HDL: 39  (06/24/2009)   Triglycerides: 80  (06/24/2009)    SGOT (AST): 15  (06/24/2009)   SGPT (ALT): 15  (06/24/2009)   Alkaline phosphatase: 64  (06/24/2009)   Total bilirubin: 0.4  (06/24/2009)  Hypertension   Last Blood Pressure: 179 / 87  (08/11/2009)   Serum creatinine: 0.93  (06/24/2009)   Serum potassium 3.3  (06/24/2009)  Self-Management Support :   Personal Goals (by the next clinic visit) :      Personal blood pressure goal: 140/90  (03/24/2009)     Personal LDL goal: 100  (03/24/2009)    Patient will work on the following items until the next clinic visit to reach self-care goals:     Medications and monitoring: take my medicines every day  (08/11/2009)     Eating: eat more vegetables, use fresh or frozen vegetables, eat fruit for snacks and desserts  (08/11/2009)     Activity: take a 30 minute walk every day, park at the far end of the parking lot  (08/11/2009)    Hypertension self-management support: Written self-care plan, Education handout, Resources for patients handout  (08/11/2009)   Hypertension self-care plan printed.   Hypertension education handout printed    Lipid self-management support: Written self-care plan, Education handout, Resources for patients handout  (08/11/2009)   Lipid self-care plan printed.   Lipid education handout  printed      Resource handout printed.

## 2010-03-02 NOTE — Progress Notes (Signed)
  Walk in Patient Form Recieved " Pt left [employer] Info forwarded to [employer]  May 10, 2009 10:51 AM

## 2010-03-02 NOTE — Assessment & Plan Note (Signed)
Summary: FU VISIT/DS   Vital Signs:  Patient profile:   60 year old male Height:      71 inches (180.34 cm) Weight:      176.0 pounds (80 kg) BMI:     24.64 Temp:     97.2 degrees F (36.22 degrees C) oral Pulse rate:   82 / minute BP sitting:   148 / 77  (right arm)  Vitals Entered By: Stanton Kidney Ditzler RN (October 27, 2009 2:51 PM) CC: check-up Is Patient Diabetic? No Pain Assessment Patient in pain? no      Nutritional Status BMI of 19 -24 = normal Nutritional Status Detail appetite good  Have you ever been in a relationship where you felt threatened, hurt or afraid?denies   Does patient need assistance? Functional Status Self care Ambulation Normal Comments FU and refills on meds.   Primary Care Provider:  Danelle Berry, MD  CC:  check-up.  History of Present Illness: Last time patient was here, he was above goal with regards to his blood pressure. Has tried to reduce his salt intake, and he has done this, avoiding most pork products and salty foods.  Repeat 134/76.  Patient states that he has had a better mood, doesn't feel down and out like he has in the past.  No SI/HI.    No chest pain, shortness of breath, abdominal pain, change in bms, blood in stool, dysuria, or headaches.  No fevers, weight loss.   Depression History:      The patient denies a depressed mood most of the day and a diminished interest in his usual daily activities.         Preventive Screening-Counseling & Management  Alcohol-Tobacco     Smoking Status: quit     Smoking Cessation Counseling: yes     Year Started: occ pot     Year Quit: 5 YEARS   Caffeine-Diet-Exercise     Does Patient Exercise: yes     Type of exercise: WALKING     Times/week: AT TIMES  Current Medications (verified): 1)  Norvasc 10 Mg Tabs (Amlodipine Besylate) .... Take 1 Tablet By Mouth Once A Day For Blood Pressure 2)  Zocor 40 Mg Tabs (Simvastatin) .... Take 1 Pill By Mouth Daily. 3)  Pletal 100 Mg Tabs  (Cilostazol) .... Take 1 Pill By Mouth Twice Daily 4)  Plavix 75 Mg Tabs (Clopidogrel Bisulfate) .... Take One Tablet By Mouth Daily  Allergies: 1)  ! Asa  Past History:  Past medical, surgical, family and social histories (including risk factors) reviewed, and no changes noted (except as noted below).  Past Medical History: Reviewed history from 05/02/2009 and no changes required. 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 Left Lumbar laminectomy-1989, 1991 Microdisectomy-? location  Appendectomy R hammer toe-2nd and 5th digit  Family History: Reviewed history from 05/02/2009 and no changes required.  He does not know his parents' history, and there is no   specific history of coronary or peripheral arterial disease.  Social History: Reviewed history from 05/02/2009 and no changes required.  The patient is separated from his wife.  He lives   locally in [place].  He has three grown children.  He quit smoking cigarettes 20 years ago and quit alcohol approximately 20 years ago as well.  He reports marijuana use, about once a week, he uses it to help with his back pain.  Former cocaine user - quit in the 1980s.      Review of Systems       see HPI  Physical Exam  General:  NAD Mouth:   pharynx pink and moist.   Neck:  supple, full ROM, no masses, and no thyromegaly.   Lungs:  normal respiratory effort, normal breath sounds, no crackles, and no wheezes.   Heart:  normal rate, regular rhythm, and no murmur.   Abdomen:  soft, non-tender, and normal bowel sounds.   Extremities:  no edema Neurologic:  alert & oriented X3 and gait normal.   Psych:  Oriented X3, memory intact for recent and remote, normally interactive, good eye contact, not anxious appearing, and not depressed appearing.     Impression & Recommendations:  Problem # 1:  HYPERTENSION (ICD-401.9) Assessment Improved Patient is at goal today with repeat measurement.  Encouraged him to continue to work on his salt intake as he has done with good results - he understands that if his BP does not remain in goal we will need to restart his HCTZ.  He understands this and though he would like to not have to take another medicine he believes that he could afford restarting one at this time.  Will have him follow-up with me in 3 months.  His updated medication list for this problem includes:    Norvasc 10 Mg Tabs (Amlodipine besylate) .Marland Kitchen... Take 1 tablet by mouth once a day for blood pressure  Problem # 2:  Preventive Health Care (ICD-V70.0) Assessment: Unchanged Patient refuses flu shot today.    Problem # 3:  HYPERLIPIDEMIA (ICD-272.4) Assessment: Unchanged FLP needed at next visit or soon thereafter. Continue statin.   His updated medication list for this problem includes:    Zocor 40 Mg Tabs (Simvastatin) .Marland Kitchen... Take 1 pill by mouth daily.  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  Stable.  Stanton Kidney called today to refill patient's Plavix through his cardiologist and explained how the patient can do so in the future.  The patient was very grateful for this today.    Complete Medication List: 1)  Norvasc 10 Mg Tabs  (Amlodipine besylate) .... Take 1 tablet by mouth once a day for blood pressure 2)  Zocor 40 Mg Tabs (Simvastatin) .... Take 1 pill by mouth daily. 3)  Pletal 100 Mg Tabs (Cilostazol) .... Take 1 pill by mouth twice daily 4)  Plavix 75 Mg Tabs (Clopidogrel bisulfate) .... Take one tablet by mouth daily  Patient Instructions: 1)  Take all of your medicines as directed.  The pletal should be taken twice a day on an empty stomach which means take it either 30 minutes before you eat or 2 hours after you eat.   2)  Try to reduced your salt intake (canned foods, bacon, sausage, fast food, and potato chips.) 3)  I would like for you to follow-up with me in 3 months.   Prevention & Chronic Care Immunizations   Influenza vaccine: Not documented   Influenza vaccine deferral:  Refused  (10/27/2009)    Tetanus booster: Not documented   Td booster deferral: Deferred  (04/18/2009)    Pneumococcal vaccine: Not documented   Pneumococcal vaccine deferral: Deferred  (04/18/2009)    H. zoster vaccine: Not documented   H. zoster vaccine deferral: Deferred  (04/18/2009)  Colorectal Screening   Hemoccult: Not documented   Hemoccult action/deferral: Deferred  (04/18/2009)    Colonoscopy: Not documented   Colonoscopy action/deferral: GI referral  (04/18/2009)  Other Screening   PSA: Not documented   PSA action/deferral: Discussion deferred  (04/18/2009)   Smoking status: quit  (10/27/2009)  Lipids   Total Cholesterol: 144  (06/24/2009)   Lipid panel action/deferral: Lipid Panel ordered   LDL: 89  (06/24/2009)   LDL Direct: Not documented   HDL: 39  (06/24/2009)   Triglycerides: 80  (06/24/2009)    SGOT (AST): 15  (06/24/2009)   SGPT (ALT): 15  (06/24/2009)   Alkaline phosphatase: 64  (06/24/2009)   Total bilirubin: 0.4  (06/24/2009)    Lipid flowsheet reviewed?: Yes   Progress toward LDL goal: Improved  Hypertension   Last Blood Pressure: 148 / 77  (10/27/2009)   Serum creatinine:  1.02  (08/11/2009)   Serum potassium 4.0  (08/11/2009)    Hypertension flowsheet reviewed?: Yes   Progress toward BP goal: Improved    Stage of readiness to change (hypertension management): Maintenance  Self-Management Support :   Personal Goals (by the next clinic visit) :      Personal blood pressure goal: 140/90  (03/24/2009)     Personal LDL goal: 100  (03/24/2009)    Patient will work on the following items until the next clinic visit to reach self-care goals:     Medications and monitoring: take my medicines every day, bring all of my medications to every visit  (10/27/2009)     Eating: eat more vegetables, use fresh or frozen vegetables, eat foods that are low in salt, eat fruit for snacks and desserts, limit or avoid alcohol  (10/27/2009)     Activity: take a 30 minute walk every day, take the stairs instead of the elevator, park at the far end of the parking lot  (10/27/2009)    Hypertension self-management support: Written self-care plan, Education handout, Resources for patients handout  (10/27/2009)   Hypertension self-care plan printed.   Hypertension education handout printed    Lipid self-management support: Written self-care plan, Education handout, Resources for patients handout  (10/27/2009)   Lipid self-care plan printed.   Lipid education handout printed      Resource handout printed.

## 2010-03-02 NOTE — Assessment & Plan Note (Signed)
Summary: ROUTINE CK/EST/VS   Vital Signs:  Patient profile:   60 year old male Height:      71 inches (180.34 cm) Weight:      178.6 pounds (80.86 kg) BMI:     24.90 Temp:     96.7 degrees F (35.94 degrees C) oral Pulse rate:   65 / minute BP sitting:   156 / 84  (right arm) Cuff size:   RIGHT  Vitals Entered By: Theotis Barrio NT II (March 24, 2009 8:57 AM) CC: LEFT SHOULDER PAIN  /  ROUTINE OFFICE VISIT  /  BILATERAL FOOT PAIN,  Is Patient Diabetic? No Pain Assessment Patient in pain? yes     Location: L/SHOULDER Intensity:      7 Type: aching Onset of pain  FOR ABOUT 1 YEAR Nutritional Status BMI of 19 -24 = normal  Have you ever been in a relationship where you felt threatened, hurt or afraid?No   Does patient need assistance? Functional Status Self care Ambulation Normal Comments LEFT SHOULDER PAIN  /  ROUTINE OFFICE VISIT   /  BILATERAL FOOT PAIN   Primary Care Provider:  Lollie Sails MD  CC:  LEFT SHOULDER PAIN  /  ROUTINE OFFICE VISIT  /  BILATERAL FOOT PAIN and .  History of Present Illness: Stephen [name] is a 60 yo man with PMH as outlined in the EMR comes today for a f/u visit after a long time.   1. HTN: Pt states he is taking his BP medications, but without any problems. He states he took extra salty food recently.   2. HL: He takes his zocor.   3. Intermittent Claudication: He has not been taking his pletal as suggested by Dr. Excell Seltzer. He has not taken it for a year, and he states Dr. Excell Seltzer asked him not to take his medicine. But, Dr. Earmon Phoenix note states he need to take pletal. He still has leg pain and it is still similar.   4. Tobacco abuse: He has not been smoking for many years.   5. CP: He denies any CP.   Depression History:      The patient denies a depressed mood most of the day and a diminished interest in his usual daily activities.         Current Medications (verified): 1)  Norvasc 10 Mg Tabs (Amlodipine Besylate) .... Take 1 Tablet  By Mouth Once A Day For Blood Pressure 2)  Zocor 40 Mg  Tabs (Simvastatin) .... Take 1 Pill By Mouth Daily. 3)  Ultram 50 Mg  Tabs (Tramadol Hcl) .... Take 1 Pill By Mouth As Needed For Pain Up To 6 Hourly. 4)  Pletal 100 Mg Tabs (Cilostazol) .... Take 1 Pill By Mouth Daily  Allergies: 1)  ! Asa  Review of Systems      See HPI  Physical Exam  General:  alert.   Mouth:  pharynx pink and moist.   Lungs:  normal breath sounds, no crackles, and no wheezes.   Heart:  normal rate, regular rhythm, no murmur, and no gallop.   Extremities:  trace left pedal edema and trace right pedal edema.     Impression & Recommendations:  Problem # 1:  CHEST PAIN, INTERMITTENT (ICD-786.50) Denies any CP.   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 Excell Seltzer few months ago. I asked him to call Dr. Earmon Phoenix office for a f/u visit. He states  his claudication symptoms are stable.   Problem # 3:  HYPERTENSION (ICD-401.9) Althogh his initial BP was156/84, when I rechecked it was 140/70. Plan is to continue his norvasc for now. Encouraged the pt to come more frequently for f/u visit.  His updated medication list for this problem includes:    Norvasc 10 Mg Tabs (Amlodipine besylate) .Marland Kitchen... Take 1 tablet by mouth once a day for blood pressure  Orders: T-Comprehensive Metabolic Panel [id number]) T-Lipid Profile ([id number]) T-TSH ([id number]) T-T4, Free ([id number])  BP today: 156/84 Prior BP: 124/74 (08/22/2007)  Labs Reviewed: K+: 4.0 (08/08/2007) Creat: : 0.78 (08/08/2007)   Chol: 181 (08/08/2007)   HDL: 44 (08/08/2007)   LDL: 114 (08/08/2007)   TG: 117 (08/08/2007)  Problem # 4:  HYPERLIPIDEMIA (ICD-272.4) Cont. zocor and check followings.  His updated medication list for this problem includes:    Zocor 40 Mg Tabs (Simvastatin) .Marland Kitchen... Take 1 pill by mouth daily.  Orders: T-Comprehensive Metabolic Panel  ([id number]) T-Lipid Profile [id number]) T-TSH [id number]) T-T4, Free [phone])  Labs Reviewed: SGOT: 39 (08/08/2007)   SGPT: 44 (08/08/2007)   HDL:44 (08/08/2007), 32 (07/11/2006)  LDL:114 (08/08/2007), 97 (51/88/4166)  Chol:181 (08/08/2007), 157 (07/11/2006)  Trig:117 (08/08/2007), 141 (07/11/2006)  Complete Medication List: 1)  Norvasc 10 Mg Tabs (Amlodipine besylate) .... Take 1 tablet by mouth once a day for blood pressure 2)  Zocor 40 Mg Tabs (Simvastatin) .... Take 1 pill by mouth daily. 3)  Ultram 50 Mg Tabs (Tramadol hcl) .... Take 1 pill by mouth as needed for pain up to 6 hourly. 4)  Pletal 100 Mg Tabs (Cilostazol) .... Take 1 pill by mouth daily  Patient Instructions: 1)  Please schedule a follow-up appointment in 2 weeks. 2)  Limit your Sodium (Salt) to less than 2 grams a day(slightly less than 1/2 a teaspoon) to prevent fluid retention, swelling, or worsening of symptoms. 3)  It is important that you exercise regularly at least 20 minutes 5 times a week. If you develop chest pain, have severe difficulty breathing, or feel very tired , stop exercising immediately and seek medical attention. 4)  Check your Blood Pressure regularly. If it is above: you should make an appointment. Prescriptions: ULTRAM 50 MG  TABS (TRAMADOL HCL) take 1 pill by mouth as needed for pain up to 6 hourly.  #120 x 0   Entered and Authorized by:   Jason Coop MD   Signed by:   Jason Coop MD on 03/24/2009   Method used:   Print then Give to Patient   RxID:   [id number] ZOCOR 40 MG  TABS (SIMVASTATIN) take 1 pill by mouth daily.  #30 x 4   Entered and Authorized by:   Jason Coop MD   Signed by:   Jason Coop MD on 03/24/2009   Method used:   Print then Give to Patient   RxID:   [id number] NORVASC 10 MG TABS (AMLODIPINE BESYLATE) Take 1 tablet by mouth once a day for blood pressure  #30 x 4   Entered and Authorized by:   Jason Coop MD    Signed by:   Jason Coop MD on 03/24/2009   Method used:   Print then Give to Patient   RxID:   [id number]  Process Orders Check Orders Results:     [facility]: Check successful Tests Sent for requisitioning (March 26, 2009 10:31 PM):     03/24/2009: [facility] -- T-Comprehensive Metabolic Panel [[id number]] (signed)     03/24/2009: [facility] -- T-Lipid Profile [phone] (signed)     03/24/2009: [facility] -- T-TSH [phone] (signed)     03/24/2009: [facility] -- T-T4, [place] [[id number]] (signed)    Prevention & Chronic Care Immunizations   Influenza vaccine: Not documented   Influenza vaccine deferral: Refused  (03/24/2009)    Tetanus booster: Not documented    Pneumococcal vaccine: Not documented    H. zoster vaccine: Not documented  Colorectal Screening   Hemoccult: Not documented    Colonoscopy: Not documented  Other Screening   PSA: Not documented   Smoking status: current  (08/12/2006)   Smoking cessation counseling: yes  (08/22/2007)  Lipids   Total Cholesterol: 181  (08/08/2007)   Lipid panel action/deferral: Lipid Panel ordered   LDL: 114  (08/08/2007)   LDL Direct: Not documented   HDL: 44  (08/08/2007)   Triglycerides: 117  (08/08/2007)    SGOT (AST): 39  (08/08/2007)   SGPT (ALT): 44  (08/08/2007) CMP ordered    Alkaline phosphatase: 76  (08/08/2007)   Total bilirubin: 0.2  (08/08/2007)    Lipid flowsheet reviewed?: Yes   Progress toward LDL goal: Unchanged  Hypertension   Last Blood Pressure: 156 / 84  (03/24/2009)   Serum creatinine: 0.78  (08/08/2007)   Serum potassium 4.0  (08/08/2007) CMP ordered     Hypertension flowsheet reviewed?: Yes   Progress toward BP goal: Deteriorated  Self-Management Support :   Personal Goals (by the next clinic visit) :      Personal blood pressure goal: 140/90  (03/24/2009)     Personal LDL  goal: 100  (03/24/2009)    Patient will work on the following items until the next clinic visit to reach self-care goals:     Medications and monitoring: take my medicines every day, bring all of my medications to every visit  (03/24/2009)     Eating: drink diet soda or water instead of juice or soda, use fresh or frozen vegetables, eat fruit for snacks and desserts, limit or avoid alcohol  (03/24/2009)     Activity: take a 30 minute walk every day  (03/24/2009)    Hypertension self-management support: Resources for patients handout, Written self-care plan  (03/24/2009)   Hypertension self-care plan printed.    Lipid self-management support: Resources for patients handout, Written self-care plan  (03/24/2009)   Lipid self-care plan printed.      Resource handout printed.

## 2010-03-02 NOTE — Assessment & Plan Note (Signed)
Summary: EST-3 MONTH CHECKUP/CH   Vital Signs:  Patient profile:   60 year old male Height:      71 inches (180.34 cm) Weight:      180.4 pounds (82 kg) BMI:     25.25 Temp:     97.7 degrees F (36.50 degrees C) oral Pulse rate:   92 / minute BP sitting:   123 / 76  (left arm)  Vitals Entered By: Stanton Kidney Ditzler RN (January 12, 2010 1:32 PM) CC: none Is Patient Diabetic? No Pain Assessment Patient in pain? no      Nutritional Status BMI of 25 - 29 = overweight Nutritional Status Detail appettite good  Have you ever been in a relationship where you felt threatened, hurt or afraid?denies   Does patient need assistance? Functional Status Self care Ambulation Normal Comments 3 month ck-up.   Visit Type:  check-up Primary Care Dhamar Gregory:  Danelle Berry, MD  CC:  none.  History of Present Illness: No complaints, has been working on his diet.  No new issues - leg claudication symptoms are unchanged.  States his mood is stable and he thinks he is doing ok at this time.   Depression History:      The patient denies a depressed mood most of the day and a diminished interest in his usual daily activities.         Preventive Screening-Counseling & Management  Alcohol-Tobacco     Smoking Status: quit     Smoking Cessation Counseling: yes     Year Started: occ pot     Year Quit: 5 YEARS   Caffeine-Diet-Exercise     Does Patient Exercise: yes     Type of exercise: WALKING     Times/week: AT TIMES  Current Medications (verified): 1)  Norvasc 10 Mg Tabs (Amlodipine Besylate) .... Take 1 Tablet By Mouth Once A Day For Blood Pressure 2)  Zocor 40 Mg Tabs (Simvastatin) .... Take 1 Pill By Mouth Daily. 3)  Pletal 100 Mg Tabs (Cilostazol) .... Take 1 Pill By Mouth Twice Daily 4)  Plavix 75 Mg Tabs (Clopidogrel Bisulfate) .... Take One Tablet By Mouth Daily  Allergies: 1)  ! Asa  Past History:  Past medical, surgical, family and social histories (including risk factors)  reviewed for relevance to current acute and chronic problems.  Past Medical History: Reviewed history from 05/02/2009 and no changes required. 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 Left Lumbar laminectomy-1989, 1991 Microdisectomy-? location  Appendectomy R hammer toe-2nd and 5th digit  Family History: Reviewed history from 10/27/2009 and no changes required.  He does not know his parents' history, and there is no   specific history of coronary or peripheral arterial disease.      Social History: Reviewed history from 10/27/2009 and no changes required.  The patient is separated from his wife.  He lives   locally in [place].  He has three grown children.  He quit smoking cigarettes 20 years ago and quit alcohol approximately 20 years ago as  well.  He reports marijuana use, about once a week, he uses it to help with his back pain.  Former cocaine user - quit in the 1980s.      Review of Systems  The patient denies anorexia, fever, weight loss, weight gain, chest pain, dyspnea on exertion, headaches, abdominal pain, melena, hematochezia, incontinence, muscle weakness, and depression.    Physical Exam  General:  NAD Mouth:  pharynx pink and moist.   Neck:  supple, full ROM, no masses, and no  thyromegaly.   Lungs:  normal respiratory effort, normal breath sounds, no crackles, and no wheezes.   Heart:  normal rate, regular rhythm, and no murmur.   Abdomen:  soft, non-tender, and normal bowel sounds.   Extremities:  no edema Neurologic:  alert & oriented X3 and gait normal.   Psych:  Oriented X3, memory intact for recent and remote, normally interactive, good eye contact, not anxious appearing, and not depressed appearing.     Impression & Recommendations:  Problem # 1:  HYPERTENSION (ICD-401.9) Assessment Improved  Excellent - patient has clearly worked on his diet.  Will have him back in 6 months and at that time will check BMet to monitor his renal function.  His updated medication list for this problem includes:    Norvasc 10 Mg Tabs (Amlodipine besylate) .Marland Kitchen... Take 1 tablet by mouth once a day for blood pressure  Future Orders: T-Basic Metabolic Panel [id number]) ... 06/30/2010 T-CBC No Diff ([id number]) ... 06/30/2010  Problem # 2:  HYPERLIPIDEMIA (ICD-272.4) Assessment: Unchanged  Last FLP at goal in May 2011 - will have patient return in 6 months for repeat FLP. Tolerating statin well.  His updated medication list for this problem includes:    Zocor 40 Mg Tabs (Simvastatin) .Marland Kitchen... Take 1 pill by mouth daily.  Future Orders: T-Lipid Profile [id number]) ... 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  This medicine is prescribed to him by his cardiologist Corinda Gubler)  Problem # 4:  Preventive Health Care (ICD-V70.0) Have requested results from last colonoscopy as patient believes it was done 4 years ago and said "I may be due soon because I think I was supposed to get another one in 5 years."  At next visit in 6 months will either refer for colonoscopy or will give patient hemoccult cards for testing.  Will  also check CBC.  Complete Medication List: 1)  Norvasc 10 Mg Tabs (Amlodipine besylate) .... Take 1 tablet by mouth once a day for blood pressure 2)  Zocor 40 Mg Tabs (Simvastatin) .... Take 1 pill by mouth daily. 3)  Pletal 100 Mg Tabs (Cilostazol) .... Take 1 pill by mouth twice daily 4)  Plavix 75 Mg Tabs (Clopidogrel bisulfate) .... Take one tablet by mouth daily  Patient Instructions: 1)  Please follow-up with me in 6 months. You will need a morning appointment because I want you to be fasting for blood work. 2)  Take your medicines as directed. 3)  Keep up the good work with your diet - your blood pressure was excellent today! 4)  Please call to schedule an appointment if you need to be seen sooner.   Orders Added: 1)  New Patient Level III [99203] 2)  T-Lipid Profile [[id number]] 3)  T-Basic Metabolic Panel [[id number]] 4)  T-CBC No Diff [[id number]]   Process Orders Check Orders Results:  [facility]: Check successful Order queued for requisitioning for [facility]: January 12, 2010 2:04 PM  Tests Sent for requisitioning (January 12, 2010 2:04 PM):     06/30/2010: [facility] -- T-Lipid Profile [phone] (signed)     06/30/2010: [facility] -- T-Basic Metabolic Panel [id number] (signed)     06/30/2010: [facility] -- T-CBC No Diff [[id number]] (signed)     Prevention & Chronic Care Immunizations   Influenza vaccine: Not documented   Influenza vaccine deferral: Refused  (10/27/2009)    Tetanus booster: Not documented   Td booster deferral: Deferred  (04/18/2009)    Pneumococcal vaccine: Not documented   Pneumococcal vaccine deferral: Deferred  (04/18/2009)    H. zoster vaccine: Not documented   H. zoster vaccine deferral: Deferred  (04/18/2009)  Colorectal Screening   Hemoccult: Not documented   Hemoccult action/deferral: Deferred  (04/18/2009)    Colonoscopy: Not documented    Colonoscopy action/deferral: GI referral  (04/18/2009)  Other Screening   PSA: Not documented   PSA action/deferral: Discussion deferred  (04/18/2009)  Reports requested:   Last colonoscopy report requested.  Smoking status: quit  (01/12/2010)  Lipids   Total Cholesterol: 144  (06/24/2009)   Lipid panel action/deferral: Lipid Panel ordered   LDL: 89  (06/24/2009)   LDL Direct: Not documented   HDL: 39  (06/24/2009)   Triglycerides: 80  (06/24/2009)    SGOT (AST): 15  (06/24/2009)   SGPT (ALT): 15  (06/24/2009)   Alkaline phosphatase: 64  (06/24/2009)   Total bilirubin: 0.4  (06/24/2009)    Lipid flowsheet reviewed?: Yes   Progress toward LDL goal: At goal  Hypertension   Last Blood Pressure: 123 / 76  (01/12/2010)   Serum creatinine: 1.02  (08/11/2009)   Serum potassium 4.0  (08/11/2009)    Hypertension flowsheet reviewed?: Yes   Progress toward BP goal: Improved  Self-Management Support :   Personal Goals (by the next clinic visit) :      Personal blood pressure goal: 140/90  (03/24/2009)     Personal LDL goal: 100  (03/24/2009)    Patient will work on the following items until the next clinic visit to reach self-care goals:     Medications and monitoring: take my medicines every day, bring all of my medications to every visit  (01/12/2010)     Eating: eat more vegetables, use fresh or frozen vegetables, eat foods that are low in salt, eat fruit for snacks and desserts, limit or avoid alcohol  (01/12/2010)     Activity: take a 30 minute walk every day  (01/12/2010)    Hypertension self-management support: Written self-care plan, Education handout, Resources for patients handout  (01/12/2010)   Hypertension self-care plan printed.   Hypertension education handout printed    Lipid self-management support: Written self-care plan, Education handout, Resources for patients handout  (01/12/2010)   Lipid self-care plan printed.   Lipid education handout printed       Resource handout printed.   Nursing Instructions: Request report of last colonoscopy   Process Orders Check Orders Results:     [facility]: Check successful Order queued for requisitioning for [facility]: January 12, 2010 2:04 PM  Tests Sent for requisitioning (January 12, 2010 2:04 PM):     06/30/2010: [facility] -- T-Lipid Profile [phone] (signed)     06/30/2010: [facility] -- T-Basic Metabolic Panel [phone] (signed)     06/30/2010: [facility] -- T-CBC No Diff [[id number]] (signed)

## 2010-03-02 NOTE — Assessment & Plan Note (Signed)
Summary: EST-1 MONTH F/U VISIT/CH   Vital Signs:  Patient profile:   60 year old male Height:      71 inches (180.34 cm) Weight:      178.0 pounds (80.91 kg) BMI:     24.92 Temp:     97.0 degrees F (36.11 degrees C) oral Pulse rate:   75 / minute BP sitting:   156 / 76  (right arm)  Vitals Entered By: Stanton Kidney Ditzler RN (September 15, 2009 8:40 AM) CC: Depression Is Patient Diabetic? No Pain Assessment Patient in pain? no      Nutritional Status BMI of 19 -24 = normal Nutritional Status Detail appetite good  Have you ever been in a relationship where you felt threatened, hurt or afraid?denies   Does patient need assistance? Functional Status Self care Ambulation Normal Comments FU - doing well.   Primary Care Dalia Jollie:  Danelle Berry, MD  CC:  Depression.  History of Present Illness: Pt presents today with 1 month follow-up  Leg claudication - Taking pletal once a day, not twice daily as directed as he did not realize this . He will follow-up with  cardiology in April 2012.  His leg pain is predominantly with walking and is unchanged/stable.    HTN - Patient has been taking his norvasc daily.  No headaches, CP, dizziness.    HLP - tolerating simvastain  shoulder pain - continues to be much improved.  Almost full ROM and no complaints of weakness.  He is followed by Dr. Dallas Schimke for this.  Mood - the last time the patient was seen here in the clinic, he was upset about an encounter he had at another doctor's office.  He says he has "cooled off" and is doing better  Depression History:      The patient denies a depressed mood most of the day and a diminished interest in his usual daily activities.         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 Take 1 Tablet By Mouth Once A Day For Blood Pressure 2)  Zocor 40 Mg Tabs (Simvastatin) .... Take 1 Pill By Mouth Daily. 3)  Pletal 100 Mg Tabs (Cilostazol) .... Take 1 Pill By Mouth Twice Daily 4)  Plavix 75 Mg Tabs (Clopidogrel Bisulfate) .... Take One Tablet By Mouth Daily  Allergies: 1)  ! 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 location  Appendectomy R hammer toe-2nd and 5th digit  Family History: Last updated: 05/02/2009   He does not know his parents' history, and there is no   specific history of coronary or peripheral arterial disease.      Social History: Last updated: 05/02/2009  The patient is separated from his wife.  He lives   locally in [place].  He has three grown children.  He quit smoking  cigarettes 20 years ago and quit alcohol approximately 20 years ago as  well.  He reports marijuana use.      Risk Factors: Exercise: yes (09/15/2009)  Risk Factors: Smoking Status: quit (09/15/2009)  Review of Systems       see HPI  Physical Exam  General:  alert, well-developed, and well-nourished.   Mouth:  pharynx pink and moist.   Neck:  supple, full ROM, and no masses.   Lungs:  normal respiratory effort, normal breath sounds, no crackles, and no wheezes.   Heart:  RRR, no murmurs Abdomen:  soft, non-tender, and normal bowel sounds.   Msk:  5/5 strength, full range of motion, including left shoulder Pulses:  absent DP/PT pulses Extremities:  no edema bilaterally Neurologic:  grossly intact sensation.  A+O x3.  gait normal. Psych:  Oriented X3, memory intact for recent and remote, normally interactive, good eye contact, not anxious appearing, and not depressed appearing.     Impression & Recommendations:  Problem # 1:  HYPERTENSION (ICD-401.9) Assessment Deteriorated Patient is above goal today, including on repeat BP measurement (168/62).  No headaches/CP/dizziness or other symptoms.  Pt taking his norvasc daily. Endorses a high salt diet.  Was previously on HCTZ 25 daily but this was stopped by cardiology.  We would like to restart HCTZ 12.5 daily today however the patient says he cannot afford even a generic prescription until he gets his monthly check on Sept 3rd.  Keeping this in mind, we will have him return to clinic after 9/3 for a recheck of BP and  consideration of starting HCTZ at a low dose.  In the meantime he was counseled about diet modification with the hope that this may bring his BP closer to goal.  His updated medication list for this problem includes:    Norvasc 10 Mg Tabs (Amlodipine besylate) .Marland Kitchen... 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  On plavix and pletal, prescribed by Dr. Excell Seltzer (cards).  Pt has been taking pletal once daily, which was his starting dose, but he should be on twice daily.  He was informed of this and stressed that pletal should be taken on an empty stomach (30 minutes before meals or 2 hours after).    Problem # 3:  HYPERLIPIDEMIA (ICD-272.4) Assessment: Unchanged Continue statin. His updated medication list for this problem includes:    Zocor 40 Mg Tabs (Simvastatin) .Marland Kitchen... Take 1 pill by mouth daily.  Problem # 4:  FOOT  PAIN, BILATERAL (ICD-729.5) Assessment: Unchanged Stable.    Problem # 5:  ADHESIVE CAPSULITIS, LEFT (ICD-726.0) Assessment: Improved Stable.  Complete Medication List: 1)  Norvasc 10 Mg Tabs (Amlodipine besylate) .... Take 1 tablet by mouth once a day for blood pressure 2)  Zocor 40 Mg Tabs (Simvastatin) .... Take 1 pill by mouth daily. 3)  Pletal 100 Mg Tabs (Cilostazol) .... Take 1 pill by mouth twice daily 4)  Plavix 75 Mg Tabs (Clopidogrel bisulfate) .... Take one tablet by mouth daily  Patient Instructions: 1)  Please return to the clinic after the 3rd of September.   2)  Take all of your medicines as directed.  The pletal should be taken twice a day on an empty stomach which means take it either 30 minutes before you eat or 2 hours after you eat.   3)  Try to reduced your salt intake (canned foods, bacon, sausage, fast food, and potato chips.)   Prevention & Chronic Care Immunizations   Influenza vaccine: Not documented   Influenza vaccine deferral: Refused   (03/24/2009)    Tetanus booster: Not documented   Td booster deferral: Deferred  (04/18/2009)    Pneumococcal vaccine: Not documented   Pneumococcal vaccine deferral: Deferred  (04/18/2009)    H. zoster vaccine: Not documented   H. zoster vaccine deferral: Deferred  (04/18/2009)  Colorectal Screening   Hemoccult: Not documented   Hemoccult action/deferral: Deferred  (04/18/2009)    Colonoscopy: Not documented   Colonoscopy action/deferral: GI referral  (04/18/2009)  Other Screening   PSA: Not documented   PSA action/deferral: Discussion deferred  (04/18/2009)  Reports requested:   Last colonoscopy report requested.  Smoking status: quit  (09/15/2009)  Lipids   Total Cholesterol: 144  (06/24/2009)   Lipid panel action/deferral: Lipid Panel ordered   LDL: 89  (06/24/2009)   LDL Direct: Not documented   HDL: 39  (06/24/2009)   Triglycerides: 80  (06/24/2009)    SGOT (AST): 15  (06/24/2009)   SGPT (ALT): 15  (06/24/2009)   Alkaline phosphatase: 64  (06/24/2009)   Total bilirubin: 0.4  (06/24/2009)  Hypertension   Last Blood Pressure: 156 / 76  (09/15/2009)   Serum creatinine: 1.02  (08/11/2009)   Serum potassium 4.0  (08/11/2009)  Self-Management Support :   Personal Goals (by the next clinic visit) :      Personal blood pressure goal: 140/90  (03/24/2009)     Personal LDL goal: 100  (03/24/2009)    Patient will work on the following items until the next clinic visit to reach self-care goals:     Medications and monitoring: take my medicines every day, bring all of my medications to every visit  (09/15/2009)     Eating: eat more vegetables, use fresh or frozen vegetables, eat fruit for snacks and desserts, limit or avoid alcohol  (09/15/2009)     Activity: take a 30 minute walk every day, park at the far end of the parking lot  (09/15/2009)    Hypertension self-management support: Written self-care plan, Education handout, Resources for patients handout   (09/15/2009)   Hypertension self-care plan printed.   Hypertension education handout printed    Lipid self-management support: Written self-care plan, Education handout, Resources for patients handout  (09/15/2009)   Lipid self-care plan printed.   Lipid education handout printed      Resource handout printed.   Nursing Instructions: Request report of last colonoscopy   Appended Document: EST-1 MONTH F/U VISIT/CH I discussed Mr Galentine  with Dr Claudette Laws and I agree with her note above.  Vascular had indicated that they wanted the Pletal two times a day and all notes supported this - I agree with Dr Claudette Laws instructioning pt to resume two times a day dosing. Elevated BP - diet modification and F/U to recheck BP.

## 2010-03-02 NOTE — Letter (Signed)
Summary: [employer] Patient [employer]/process [employer] Patient Assistance Application   Imported By: Roderic Ovens 05/26/2009 16:37:21  _____________________________________________________________________  External Attachment:    Type:   Image     Comment:   External Document

## 2010-03-02 NOTE — Assessment & Plan Note (Signed)
Summary: F/U/EST/VS   Vital Signs:  Patient profile:   60 year old male Height:      71 inches (180.34 cm) Weight:      175.03 pounds (79.56 kg) BMI:     24.50 Temp:     97.9 degrees F (36.61 degrees C) oral Pulse rate:   104 / minute BP sitting:   142 / 75  (right arm) Cuff size:   regular  Vitals Entered By: Angelina Ok RN (May 18, 2009 1:48 PM) Is Patient Diabetic? No Pain Assessment Patient in pain? yes     Location: left shoulder Intensity: 8 Type: aching, throbbing Onset of pain  Constant Nutritional Status BMI of 19 -24 = normal  Have you ever been in a relationship where you felt threatened, hurt or afraid?No   Does patient need assistance? Functional Status Self care Ambulation Normal Comments Pain pils do not always work for shoulder pain.  Pt did not get his medicine change to 80 mg.  [facility] pharmacy would not give him the 80mg .  Has been started on Plavix.  Has not taken 1st pill yet.  Needs refill on pain medicine.  Check up.   Primary Care Provider:  Lollie Sails MD   History of Present Illness: Stephen [name] is a 1 yo man with PMH as outlined in the EMR comes today for a f/u visit.   1. HTN: He is taking his norvasc and his HCTZ was stopped at Dr. Earmon Phoenix office as his BP there was normal.   2. HL: When he got to fill his zocor he was given 40 mg and not 80 mg and he did not remind them after he saw the 40 mg zocor.   3. Intermittent Claudication: He was started on plavix and he had a repeat ABIs' of his legs, which were stable. He was also started on cilastazol.   4. Foot pain: He went to podiatrist and had his toes trimmmed and was asked to f/u in 6 month.    Depression History:      The patient denies a depressed mood most of the day and a diminished interest in his usual daily activities.         Preventive Screening-Counseling & Management  Alcohol-Tobacco     Smoking Status: current     Smoking Cessation Counseling: yes     Year  Started: occ pot     Year Quit: 5 YEARS   Comments: No smoking.  Current Medications (verified): 1)  Norvasc 10 Mg Tabs (Amlodipine Besylate) .... Take 1 Tablet By Mouth Once A Day For Blood Pressure 2)  Zocor 80 Mg Tabs (Simvastatin) .... Take 1 Pill By Mouth Daily. 3)  Ultram 50 Mg  Tabs (Tramadol Hcl) .... Take 1 Pill By Mouth As Needed For Pain Up To 6 Hourly. 4)  Pletal 100 Mg Tabs (Cilostazol) .... Take 1 Pill By Mouth Daily 5)  Plavix 75 Mg Tabs (Clopidogrel Bisulfate) .... Take One Tablet By Mouth Daily  Allergies: 1)  ! Asa  Review of Systems      See HPI  Physical Exam  General:  alert.   Mouth:  pharynx pink and moist.   Lungs:  normal breath sounds, no crackles, and no wheezes.   Heart:  normal rate, regular rhythm, no murmur, and no gallop.   Extremities:  trace left pedal edema and trace right pedal edema.   Neurologic:  alert & oriented X3.     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 Excell Seltzer and was started back on cilastazol and also plavix, as he is intolerant to asa. He gets plavix for free from a program from Dr. Earmon Phoenix office.   Problem # 2:  HYPERLIPIDEMIA (ICD-272.4) He was given zocor 40 mg and I asked him to take 2 pills and start using new prescription next time. FLP in a month.  His updated medication list for this problem includes:    Zocor 80 Mg Tabs (Simvastatin) .Marland Kitchen... Take 1 pill by mouth daily.  Problem # 3:  HYPERTENSION (ICD-401.9) BP almost at goal and noted that his BP was better at Dr. Earmon Phoenix office and so his HCTZ was stopped. Will f/u BP closely.  His updated medication list for this problem includes:    Norvasc 10 Mg Tabs (Amlodipine besylate) .Marland Kitchen... Take 1 tablet by mouth once a day for blood pressure  BP today: 142/75 Prior BP: 121/75 (05/03/2009)  Labs Reviewed: K+: 3.9 (03/24/2009) Creat: : 0.98 (03/24/2009)   Chol: 173 (03/24/2009)   HDL: 33  (03/24/2009)   LDL: 116 (03/24/2009)   TG: 120 (03/24/2009)  Problem # 4:  FOOT PAIN, BILATERAL (ICD-729.5) F/b podiatry.   Complete Medication List: 1)  Norvasc 10 Mg Tabs (Amlodipine besylate) .... Take 1 tablet by mouth once a day for blood pressure 2)  Zocor 80 Mg Tabs (Simvastatin) .... Take 1 pill by mouth daily. 3)  Ultram 50 Mg Tabs (Tramadol hcl) .... Take 1 pill by mouth as needed for pain up to 6 hourly. 4)  Pletal 100 Mg Tabs (Cilostazol) .... Take 1 pill by mouth daily 5)  Plavix 75 Mg Tabs (Clopidogrel bisulfate) .... Take one tablet by mouth daily  Patient Instructions: 1)  Please schedule a follow-up appointment in 1 month. 2)  Limit your Sodium (Salt) to less than 2 grams a day(slightly less than 1/2 a teaspoon) to prevent fluid retention, swelling, or worsening of symptoms. 3)  Tobacco is very bad for your health and your loved ones! You Should stop smoking!. 4)  Stop Smoking Tips: Choose a Quit date. Cut down before the Quit date. decide what you will do as a substitute when you feel the urge to smoke(gum,toothpick,exercise). 5)  It is important that you exercise regularly at least 20 minutes 5 times a week. If you develop chest pain, have severe difficulty breathing, or feel very tired , stop exercising immediately and seek medical attention. 6)  You need to lose weight. Consider a lower calorie diet and regular exercise.   Prevention & Chronic Care Immunizations   Influenza vaccine: Not documented   Influenza vaccine deferral: Refused  (03/24/2009)    Tetanus booster: Not documented   Td booster deferral: Deferred  (04/18/2009)    Pneumococcal vaccine: Not documented   Pneumococcal vaccine deferral: Deferred  (04/18/2009)    H. zoster vaccine: Not documented   H. zoster vaccine deferral: Deferred  (04/18/2009)  Colorectal Screening   Hemoccult: Not documented   Hemoccult action/deferral: Deferred  (04/18/2009)    Colonoscopy: Not documented   Colonoscopy  action/deferral: GI referral  (04/18/2009)  Other Screening   PSA: Not documented   PSA action/deferral: Discussion deferred  (04/18/2009)   Smoking status: current  (05/18/2009)   Smoking cessation counseling: yes  (05/18/2009)  Lipids   Total Cholesterol: 173  (03/24/2009)   Lipid panel action/deferral: Lipid Panel ordered   LDL: 116  (03/24/2009)   LDL Direct: Not documented   HDL:  33  (03/24/2009)   Triglycerides: 120  (03/24/2009)    SGOT (AST): 17  (03/24/2009)   SGPT (ALT): 16  (03/24/2009)   Alkaline phosphatase: 66  (03/24/2009)   Total bilirubin: 0.3  (03/24/2009)    Lipid flowsheet reviewed?: Yes   Progress toward LDL goal: Unchanged  Hypertension   Last Blood Pressure: 142 / 75  (05/18/2009)   Serum creatinine: 0.98  (03/24/2009)   Serum potassium 3.9  (03/24/2009)    Hypertension flowsheet reviewed?: Yes   Progress toward BP goal: Unchanged  Self-Management Support :   Personal Goals (by the next clinic visit) :      Personal blood pressure goal: 140/90  (03/24/2009)     Personal LDL goal: 100  (03/24/2009)    Patient will work on the following items until the next clinic visit to reach self-care goals:     Medications and monitoring: take my medicines every day, bring all of my medications to every visit  (05/18/2009)     Eating: drink diet soda or water instead of juice or soda, eat more vegetables, use fresh or frozen vegetables, eat foods that are low in salt, eat baked foods instead of fried foods, eat fruit for snacks and desserts, limit or avoid alcohol  (05/18/2009)     Activity: take a 30 minute walk every day  (05/18/2009)    Hypertension self-management support: Written self-care plan, Education handout, Pre-printed educational material, Resources for patients handout  (05/18/2009)   Hypertension self-care plan printed.   Hypertension education handout printed    Lipid self-management support: Written self-care plan, Education handout,  Pre-printed educational material, Resources for patients handout  (05/18/2009)   Lipid self-care plan printed.   Lipid education handout printed      Resource handout printed.     Vital Signs:  Patient profile:   60 year old male Height:      71 inches (180.34 cm) Weight:      175.03 pounds (79.56 kg) BMI:     24.50 Temp:     97.9 degrees F (36.61 degrees C) oral Pulse rate:   104 / minute BP sitting:   142 / 75  (right arm) Cuff size:   regular  Vitals Entered By: Angelina Ok RN (May 18, 2009 1:48 PM)

## 2010-03-02 NOTE — Progress Notes (Signed)
Summary: REFILL/ HLA  Phone Note Refill Request Message from:  Fax from Pharmacy on March 09, 2009 10:24 AM  Refills Requested: Medication #1:  NORVASC 10 MG TABS Take 1 tablet by mouth once a day for blood pressure   Dosage confirmed as above?Dosage Confirmed   Last Refilled: 1/13  Medication #2:  ZOCOR 40 MG  TABS take 1 pill by mouth daily.   Last Refilled: 1/13 Initial call taken by: Marin Roberts RN,  March 09, 2009 10:25 AM  Follow-up for Phone Call        Pt needs lab work, I will order CMET.  Follow-up by: Jason Coop MD,  March 11, 2009 12:55 PM    Prescriptions: ZOCOR 40 MG  TABS (SIMVASTATIN) take 1 pill by mouth daily.  #30 x 4   Entered and Authorized by:   Jason Coop MD   Signed by:   Jason Coop MD on 03/11/2009   Method used:   Electronically to        [id number]* (retail)       [address]       [place], [place]  [address]       Ph: [phone]       Fax: [phone]   RxID:   [id number] NORVASC 10 MG TABS (AMLODIPINE BESYLATE) Take 1 tablet by mouth once a day for blood pressure  #30 x 4   Entered and Authorized by:   Jason Coop MD   Signed by:   Jason Coop MD on 03/11/2009   Method used:   Electronically to        [facility] [phone]* (retail)       [address]       [place], [place]  [address]       Ph: [phone]       Fax: [phone]   RxID:   [id number]   Process Orders Check Orders Results:     [facility]: Check successful Order queued for requisitioning for [facility]: March 11, 2009 12:56 PM  Tests Sent for requisitioning (March 11, 2009 12:56 PM):     03/09/2009: [facility] -- T-Comprehensive Metabolic Panel [id number] (signed)

## 2010-03-02 NOTE — Assessment & Plan Note (Signed)
Summary: F/U,MC   Vital Signs:  Patient profile:   60 year old male BP sitting:   177 / 91  Vitals Entered By: Lillia Pauls CMA (August 02, 2009 9:20 AM)  History of Present Illness: f/u L adhesive capsulitis, doing dramatically better, compliant with HEP, ROM is now essentially full, good str.  s/p subac inj  REVIEW OF SYSTEMS  GEN: No systemic complaints, no fevers, chills, sweats, or other acute illnesses MSK: Detailed in the HPI GI: tolerating PO intake without difficulty Neuro: No numbness, parasthesias, or tingling associated. Otherwise the pertinent positives of the ROS are noted above.    Shoulder: L Inspection: No muscle wasting or winging Ecchymosis/edema: neg  AC joint, scapula, clavicle: NT Cervical spine: NT, full ROM Spurling's: neg Abduction: full, 5/5 Flexion: full, 5/5 IR, full, lift-off: 5/5 ER at neutral: full, 5/5 AC crossover and compression: neg Neer: neg Hawkins: neg Drop Test: neg Empty Can: neg Supraspinatus insertion: NT Bicipital groove: NT Speed's: neg Yergason's: neg Sulcus sign: neg Scapular dyskinesis: none C5-T1 intact Sensation intact Grip 5/5   Allergies: 1)  ! 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 Take 1 tablet by mouth once a day for blood pressure 2)  Zocor 40 Mg Tabs (Simvastatin) .... Take 1 pill by mouth daily. 3)  Ultram 50 Mg Tabs (Tramadol hcl) .... Take 1 pill by mouth as needed for pain up to 6 hourly. 4)  Pletal 100 Mg Tabs (Cilostazol) .... Take 1 pill by mouth daily 5)  Plavix 75 Mg Tabs (Clopidogrel bisulfate) .... Take one tablet by mouth daily

## 2010-03-02 NOTE — Assessment & Plan Note (Signed)
Summary: EST-1 MONTH F/U VISIT/CH   Vital Signs:  Patient profile:   60 year old male Height:      71 inches (180.34 cm) Weight:      173.8 pounds (79 kg) BMI:     24.33 Temp:     97.0 degrees F (36.11 degrees C) oral Pulse rate:   79 / minute BP sitting:   155 / 95  (right arm)  Vitals Entered By: Stanton Kidney Ditzler RN (July 19, 2009 9:11 AM) Is Patient Diabetic? No Pain Assessment Patient in pain? no      Nutritional Status BMI of 19 -24 = normal Nutritional Status Detail appetite good  Have you ever been in a relationship where you felt threatened, hurt or afraid?denies   Does patient need assistance? Functional Status Self care Ambulation Normal Comments FU - doing well. PT for shoulder.   Primary Care Provider:  Lollie Sails MD   History of Present Illness: Mr. Minehart comes for a f/u visit.   1. HTN: For some reason he is not able to get a refill of norvasc from his pharmacy from 07/01/09.   2. HL: He is taking his zocor 80 mg.   3. Lumbago: Back is stable.   4. Shoulder pain: He had an injection on his left shoulder and now he is following with PT. He will also f/u with Dr. Dallas Schimke early next month.   5. Leg claudication: It is stable.   Depression History:      The patient denies a depressed mood most of the day and a diminished interest in his usual daily activities.         Preventive Screening-Counseling & Management  Alcohol-Tobacco     Smoking Status: current     Smoking Cessation Counseling: yes     Year Started: occ pot     Year Quit: 5 YEARS   Caffeine-Diet-Exercise     Does Patient Exercise: yes     Type of exercise: WALKING     Times/week: AT TIMES  Current Medications (verified): 1)  Norvasc 10 Mg Tabs (Amlodipine Besylate) .... Take 1 Tablet By Mouth Once A Day For Blood Pressure 2)  Zocor 40 Mg Tabs (Simvastatin) .... Take 1 Pill By Mouth Daily. 3)  Ultram 50 Mg  Tabs (Tramadol Hcl) .... Take 1 Pill By Mouth As Needed For Pain Up To 6  Hourly. 4)  Pletal 100 Mg Tabs (Cilostazol) .... Take 1 Pill By Mouth Daily 5)  Plavix 75 Mg Tabs (Clopidogrel Bisulfate) .... Take One Tablet By Mouth Daily  Allergies: 1)  ! Asa  Review of Systems      See HPI  Physical Exam  Lungs:  normal breath sounds, no crackles, and no wheezes.   Heart:  normal rate, regular rhythm, and no murmur.   Extremities:  trace left pedal edema and trace right pedal edema.   Neurologic:  alert & oriented X3.     Impression & Recommendations:  Problem # 1:  ADHESIVE CAPSULITIS, LEFT (ICD-726.0) Being f/b Dr. Dallas Schimke. Pt currently getting PT.   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 Cont pletal and plavix per Dr. Excell Seltzer.   Problem # 3:  HYPERLIPIDEMIA (ICD-272.4) Discussed about the recent FDA warning about zocor 80 mg and decided to change the dose to 40 mg. Will get FLP on next visit.  His updated medication list for this problem includes:    Zocor 40 Mg Tabs (Simvastatin) .Marland Kitchen... Take 1 pill by mouth daily.  Labs Reviewed: SGOT: 15 (06/24/2009)   SGPT: 15 (06/24/2009)   HDL:39 (06/24/2009), 33 (03/24/2009)  LDL:89 (06/24/2009), 116 (16/10/9602)  Chol:144 (06/24/2009), 173 (03/24/2009)  Trig:80 (06/24/2009), 120 (03/24/2009)  Problem # 4:  HYPERTENSION (ICD-401.9) BP elevated, but the pt is not taking his norvasc since 07/01/09. Plan is to start back again and f/u in a month.  His updated medication list for this problem includes:    Norvasc 10 Mg Tabs (Amlodipine besylate) .Marland Kitchen... Take 1 tablet by mouth once a day for blood pressure  BP today: 155/95 Prior BP: 133/80 (07/05/2009)  Labs Reviewed: K+: 3.3 (06/24/2009) Creat: : 0.93 (06/24/2009)   Chol: 144 (06/24/2009)   HDL: 39 (06/24/2009)   LDL: 89 (06/24/2009)   TG: 80 (06/24/2009)  Problem # 5:  LUMBAGO (ICD-724.2) Stable with ultram.  His updated medication list for this problem includes:    Ultram 50 Mg Tabs (Tramadol hcl) .Marland Kitchen... Take 1 pill by mouth as  needed for pain up to 6 hourly.  Complete Medication List: 1)  Norvasc 10 Mg Tabs (Amlodipine besylate) .... Take 1 tablet by mouth once a day for blood pressure 2)  Zocor 40 Mg Tabs (Simvastatin) .... Take 1 pill by mouth daily. 3)  Ultram 50 Mg Tabs (Tramadol hcl) .... Take 1 pill by mouth as needed for pain up to 6 hourly. 4)  Pletal 100 Mg Tabs (Cilostazol) .... Take 1 pill by mouth daily 5)  Plavix 75 Mg Tabs (Clopidogrel bisulfate) .... Take one tablet by mouth daily  Patient Instructions: 1)  Please schedule a follow-up appointment in 1 month. 2)  Limit your Sodium (Salt) to less than 2 grams a day(slightly less than 1/2 a teaspoon) to prevent fluid retention, swelling, or worsening of symptoms. 3)  It is important that you exercise regularly at least 20 minutes 5 times a week. If you develop chest pain, have severe difficulty breathing, or feel very tired , stop exercising immediately and seek medical attention. 4)  Check your Blood Pressure regularly. If it is above: you should make an appointment. Prescriptions: ULTRAM 50 MG  TABS (TRAMADOL HCL) take 1 pill by mouth as needed for pain up to 6 hourly.  #120 x 0   Entered and Authorized by:   Jason Coop MD   Signed by:   Jason Coop MD on 07/19/2009   Method used:   Electronically to        [facility] (retail)       [address]       [place], [place]  [address]       Ph: [phone]       Fax: [phone]   RxID:   [id number] NORVASC 10 MG TABS (AMLODIPINE BESYLATE) Take 1 tablet by mouth once a day for blood pressure  #30 x 1   Entered and Authorized by:   Jason Coop MD   Signed by:   Jason Coop MD on 07/19/2009   Method used:   Electronically to        [facility] (retail)       [address]       [place], [place]  [address]       Ph: [phone]       Fax: [phone]   RxID:   [id number] ZOCOR 40 MG TABS  (SIMVASTATIN) take 1 pill by mouth daily.  #30 x  1   Entered and Authorized by:   Jason Coop MD   Signed by:   Jason Coop MD on 07/19/2009   Method used:   Electronically to        [facility] (retail)       [address]       [place], [place]  [address]       Ph: [phone]       Fax: [phone]   RxID:   [id number]    Prevention & Chronic Care Immunizations   Influenza vaccine: Not documented   Influenza vaccine deferral: Refused  (03/24/2009)    Tetanus booster: Not documented   Td booster deferral: Deferred  (04/18/2009)    Pneumococcal vaccine: Not documented   Pneumococcal vaccine deferral: Deferred  (04/18/2009)    H. zoster vaccine: Not documented   H. zoster vaccine deferral: Deferred  (04/18/2009)  Colorectal Screening   Hemoccult: Not documented   Hemoccult action/deferral: Deferred  (04/18/2009)    Colonoscopy: Not documented   Colonoscopy action/deferral: GI referral  (04/18/2009)  Other Screening   PSA: Not documented   PSA action/deferral: Discussion deferred  (04/18/2009)   Smoking status: current  (07/19/2009)   Smoking cessation counseling: yes  (07/19/2009)  Lipids   Total Cholesterol: 144  (06/24/2009)   Lipid panel action/deferral: Lipid Panel ordered   LDL: 89  (06/24/2009)   LDL Direct: Not documented   HDL: 39  (06/24/2009)   Triglycerides: 80  (06/24/2009)    SGOT (AST): 15  (06/24/2009)   SGPT (ALT): 15  (06/24/2009)   Alkaline phosphatase: 64  (06/24/2009)   Total bilirubin: 0.4  (06/24/2009)  Hypertension   Last Blood Pressure: 155 / 95  (07/19/2009)   Serum creatinine: 0.93  (06/24/2009)   Serum potassium 3.3  (06/24/2009)  Self-Management Support :   Personal Goals (by the next clinic visit) :      Personal blood pressure goal: 140/90  (03/24/2009)     Personal LDL goal: 100  (03/24/2009)    Patient will work on the following items until the next clinic visit to  reach self-care goals:     Medications and monitoring: take my medicines every day, bring all of my medications to every visit  (07/19/2009)     Eating: eat more vegetables, use fresh or frozen vegetables, eat fruit for snacks and desserts, limit or avoid alcohol  (07/19/2009)     Activity: take a 30 minute walk every day, park at the far end of the parking lot  (07/19/2009)    Hypertension self-management support: Written self-care plan, Education handout, Resources for patients handout  (07/19/2009)   Hypertension self-care plan printed.   Hypertension education handout printed    Lipid self-management support: Written self-care plan, Education handout, Resources for patients handout  (07/19/2009)   Lipid self-care plan printed.   Lipid education handout printed      Resource handout printed.

## 2010-03-02 NOTE — Progress Notes (Signed)
   I called pt to discuss about the potential side effects of zocor 80 mg. He is coming to see me in a few days and will defer the discussion then.

## 2010-03-02 NOTE — Progress Notes (Signed)
Summary: med refill/gp  Phone Note Refill Request Message from:  Fax from Pharmacy on Jun 01, 2009 2:37 PM  Refills Requested: Medication #1:  ULTRAM 50 MG  TABS take 1 pill by mouth as needed for pain up to 6 hourly.   Last Refilled: 03/24/2009  Method Requested: Electronic Initial call taken by: Chinita Pester RN,  Jun 01, 2009 2:37 PM  Follow-up for Phone Call       Follow-up by: Jason Coop MD,  Jun 02, 2009 10:03 AM    Prescriptions: ULTRAM 50 MG  TABS (TRAMADOL HCL) take 1 pill by mouth as needed for pain up to 6 hourly.  #120 x 0   Entered and Authorized by:   Jason Coop MD   Signed by:   Jason Coop MD on 06/02/2009   Method used:   Electronically to        [facility] [phone]* (retail)       [address]       [place], [place]  [address]       Ph: [phone]       Fax: [phone]   RxID:   [id number]

## 2010-03-02 NOTE — Assessment & Plan Note (Signed)
Summary: F/U APPT/EST/VS   Vital Signs:  Patient profile:   60 year old male Height:      71 inches (180.34 cm) Weight:      179.04 pounds (81.38 kg) BMI:     25.06 Temp:     98.2 degrees F (36.78 degrees C) oral Pulse rate:   94 / minute BP sitting:   151 / 82  (right arm)  Vitals Entered By: Angelina Ok RN (April 18, 2009 2:06 PM) CC: Check up.  Needs refills on meds Is Patient Diabetic? No Pain Assessment Patient in pain? no      Nutritional Status BMI of 25 - 29 = overweight  Have you ever been in a relationship where you felt threatened, hurt or afraid?No   Does patient need assistance? Functional Status Self care Ambulation Normal   Primary Care Provider:  Lollie Sails MD  CC:  Check up.  Needs refills on meds.  History of Present Illness: [name] is a 60 yo man with pmh  as outlined in the EMR comes today for a f/u.  1. HTN; He is taking norvasc without any problem.   2. HL: HE is taking zocor without any problem.   3. Leg claudication: It has not changed so much. He has an appointment to see Dr. Excell Seltzer next month. He has not taking pletal, which has not changes since last visit.   4. B/L foot pain: Pt has foot pain bilaterrally for about a year. The pain gets worse when he puts weight on his feet. He has seen a podiatrist before for these problems.   Depression History:      The patient denies a depressed mood most of the day and a diminished interest in his usual daily activities.        Comments:  Has depression once in a while.   Preventive Screening-Counseling & Management  Alcohol-Tobacco     Smoking Status: current     Smoking Cessation Counseling: yes     Year Started: occ pot     Year Quit: 5 YEARS   Current Medications (verified): 1)  Norvasc 10 Mg Tabs (Amlodipine Besylate) .... Take 1 Tablet By Mouth Once A Day For Blood Pressure 2)  Zocor 80 Mg Tabs (Simvastatin) .... Take 1 Pill By Mouth Daily. 3)  Ultram 50 Mg  Tabs (Tramadol Hcl)  .... Take 1 Pill By Mouth As Needed For Pain Up To 6 Hourly. 4)  Pletal 100 Mg Tabs (Cilostazol) .... Take 1 Pill By Mouth Daily 5)  Hydrochlorothiazide 25 Mg Tabs (Hydrochlorothiazide) .... Take 1 Pill By Mouth Daily.  Allergies: 1)  ! Asa  Review of Systems      See HPI  Physical Exam  Mouth:  pharynx pink and moist.   Lungs:  normal breath sounds, no crackles, and no wheezes.   Heart:  normal rate, regular rhythm, no murmur, no gallop, and no rub.   Abdomen:  soft, non-tender, normal bowel sounds, no distention, no masses, and no guarding.   Msk:  Right foot: Has callus which is tender to touch.   Left foot: Callus, although smaller than the right one and tender.  Extremities:  trace left pedal edema and trace right pedal edema.   Neurologic:  alert & oriented X3, cranial nerves II-XII intact, strength normal in all extremities, and sensation intact to light touch.   Cervical Nodes:  no anterior cervical adenopathy and no posterior cervical adenopathy.     Impression & Recommendations:  Problem # 1:  FOOT PAIN, BILATERAL (ICD-729.5) He has calluses on both feet. Plan is to refer to podiatry.   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 He has an appt with Dr. Excell Seltzer at the end of this month. He is not taking his silastazole.   Problem # 3:  HYPERTENSION (ICD-401.9) BP elevated today and on last visit as well. Plan is to start HCTZ. Informed the need for frequent visits and regular labwork.   His updated medication list for this problem includes:    Norvasc 10 Mg Tabs (Amlodipine besylate) .Marland Kitchen... Take 1 tablet by mouth once a day for blood pressure    Hydrochlorothiazide 25 Mg Tabs (Hydrochlorothiazide) .Marland Kitchen... Take 1 pill by mouth daily.  BP today: 151/82 Prior BP: 156/84 (03/24/2009)  Labs Reviewed: K+: 3.9 (03/24/2009) Creat: : 0.98 (03/24/2009)   Chol: 173 (03/24/2009)   HDL: 33 (03/24/2009)    LDL: 116 (03/24/2009)   TG: 120 (03/24/2009)  Problem # 4:  HYPERLIPIDEMIA (ICD-272.4) Pt has PAD and ideally his LDL goal is <70. He is already on Zocor 40 mg and after explaining the pros and cons of Zocor 80 mg, he agrees to try higher dose. Will check FLP and CMET in 4-6 wks.   His updated medication list for this problem includes:    Zocor 80 Mg Tabs (Simvastatin) .Marland Kitchen... Take 1 pill by mouth daily.  Labs Reviewed: SGOT: 17 (03/24/2009)   SGPT: 16 (03/24/2009)   HDL:33 (03/24/2009), 44 (08/08/2007)  LDL:116 (03/24/2009), 114 (08/08/2007)  Chol:173 (03/24/2009), 181 (08/08/2007)  Trig:120 (03/24/2009), 117 (08/08/2007)  Problem # 5:  Preventive Health Care (ICD-V70.0) Pt states he already had Colonoscopy done many yrs ago, will get records.   Complete Medication List: 1)  Norvasc 10 Mg Tabs (Amlodipine besylate) .... Take 1 tablet by mouth once a day for blood pressure 2)  Zocor 80 Mg Tabs (Simvastatin) .... Take 1 pill by mouth daily. 3)  Ultram 50 Mg Tabs (Tramadol hcl) .... Take 1 pill by mouth as needed for pain up to 6 hourly. 4)  Pletal 100 Mg Tabs (Cilostazol) .... Take 1 pill by mouth daily 5)  Hydrochlorothiazide 25 Mg Tabs (Hydrochlorothiazide) .... Take 1 pill by mouth daily.  Patient Instructions: 1)  Please schedule a follow-up appointment in 1 month. 2)  Limit your Sodium (Salt) to less than 2 grams a day(slightly less than 1/2 a teaspoon) to prevent fluid retention, swelling, or worsening of symptoms. 3)  It is important that you exercise regularly at least 20 minutes 5 times a week. If you develop chest pain, have severe difficulty breathing, or feel very tired , stop exercising immediately and seek medical attention. 4)  Check your Blood Pressure regularly. If it is above: you should make an appointment. Prescriptions: ZOCOR 80 MG TABS (SIMVASTATIN) take 1 pill by mouth daily.  #30 x 2   Entered and Authorized by:   Jason Coop MD   Signed by:   Jason Coop MD on 04/18/2009   Method used:   Electronically to        [facility] (retail)       [address]       [place], [place]  [address]       Ph: [phone]       Fax: [phone]   RxID:   [id number] HYDROCHLOROTHIAZIDE 25 MG TABS (HYDROCHLOROTHIAZIDE) take 1  pill by mouth daily.  #30 x 2   Entered and Authorized by:   Jason Coop MD   Signed by:   Jason Coop MD on 04/18/2009   Method used:   Electronically to        [facility] (retail)       [address]       [place], [place]  [address]       Ph: [phone]       Fax: [phone]   RxID:   [id number]   Prevention & Chronic Care Immunizations   Influenza vaccine: Not documented   Influenza vaccine deferral: Refused  (03/24/2009)    Tetanus booster: Not documented   Td booster deferral: Deferred  (04/18/2009)    Pneumococcal vaccine: Not documented   Pneumococcal vaccine deferral: Deferred  (04/18/2009)    H. zoster vaccine: Not documented   H. zoster vaccine deferral: Deferred  (04/18/2009)  Colorectal Screening   Hemoccult: Not documented   Hemoccult action/deferral: Deferred  (04/18/2009)    Colonoscopy: Not documented   Colonoscopy action/deferral: GI referral  (04/18/2009)  Other Screening   PSA: Not documented   PSA action/deferral: Discussion deferred  (04/18/2009)  Reports requested:   Last colonoscopy report requested.  Smoking status: current  (04/18/2009)   Smoking cessation counseling: yes  (04/18/2009)  Lipids   Total Cholesterol: 173  (03/24/2009)   Lipid panel action/deferral: Lipid Panel ordered   LDL: 116  (03/24/2009)   LDL Direct: Not documented   HDL: 33  (03/24/2009)   Triglycerides: 120  (03/24/2009)    SGOT (AST): 17  (03/24/2009)   SGPT (ALT): 16  (03/24/2009)   Alkaline phosphatase: 66  (03/24/2009)   Total bilirubin: 0.3  (03/24/2009)    Lipid flowsheet reviewed?: Yes    Progress toward LDL goal: Unchanged  Hypertension   Last Blood Pressure: 151 / 82  (04/18/2009)   Serum creatinine: 0.98  (03/24/2009)   Serum potassium 3.9  (03/24/2009)    Hypertension flowsheet reviewed?: Yes   Progress toward BP goal: Unchanged  Self-Management Support :   Personal Goals (by the next clinic visit) :      Personal blood pressure goal: 140/90  (03/24/2009)     Personal LDL goal: 100  (03/24/2009)    Patient will work on the following items until the next clinic visit to reach self-care goals:     Medications and monitoring: take my medicines every day, bring all of my medications to every visit  (04/18/2009)     Eating: drink diet soda or water instead of juice or soda, eat more vegetables, eat foods that are low in salt, eat baked foods instead of fried foods, eat fruit for snacks and desserts, limit or avoid alcohol  (04/18/2009)     Activity: take a 30 minute walk every day  (04/18/2009)    Hypertension self-management support: Written self-care plan, Education handout  (04/18/2009)   Hypertension self-care plan printed.   Hypertension education handout printed    Lipid self-management support: Written self-care plan, Education handout  (04/18/2009)   Lipid self-care plan printed.   Lipid education handout printed   Nursing Instructions: GI referral for screening colonoscopy (see order) Request report of last colonoscopy      Vital Signs:  Patient profile:   60 year old male Height:      71 inches (180.34 cm) Weight:      179.04 pounds (81.38 kg) BMI:  25.06 Temp:     98.2 degrees F (36.78 degrees C) oral Pulse rate:   94 / minute BP sitting:   151 / 82  (right arm)  Vitals Entered By: Angelina Ok RN (April 18, 2009 2:06 PM)

## 2010-03-02 NOTE — Assessment & Plan Note (Signed)
Summary: F1Y   Visit Type:  1 year follow up Primary Provider:  Lollie Sails MD  CC:  Bilateral leg pain.  History of Present Illness: 60 year-old African-American male presents for follow-up of PAD. He has long total SFA occlusions and reduced ABI's. He reports bilateral leg pain with ambulation, right greater than left. Pain is in the groin area and also in the calves, resolves after a few minutes of rest. He denies leg pain with routine activities. No chest pain or dyspnea. No other complaints.  No ulceration or rest pain.  Current Medications (verified): 1)  Norvasc 10 Mg Tabs (Amlodipine Besylate) .... Take 1 Tablet By Mouth Once A Day For Blood Pressure 2)  Zocor 80 Mg Tabs (Simvastatin) .... Take 1 Pill By Mouth Daily. 3)  Ultram 50 Mg  Tabs (Tramadol Hcl) .... Take 1 Pill By Mouth As Needed For Pain Up To 6 Hourly. 4)  Pletal 100 Mg Tabs (Cilostazol) .... Take 1 Pill By Mouth Daily  Allergies: 1)  ! Jonne Ply  Past History:  Past medical history reviewed for relevance to current acute and chronic problems.  Past Medical History: Reviewed history from 05/02/2009 and no changes required. 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) ABNRM FINDINGS, TB SKIN TEST W/O ACTIVE TB (ICD-795.5) LUMBAGO (ICD-724.2) COUGH, CHRONIC (ICD-786.2) ALCOHOL ABUSE, 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)  Review of Systems       Negative except as per HPI   Vital Signs:  Patient profile:   60 year old male Height:      71 inches Weight:       178.25 pounds BMI:     24.95 Pulse rate:   78 / minute Pulse rhythm:   regular Resp:     18 per minute BP sitting:   121 / 75  (left arm) Cuff size:   large  Vitals Entered By: Vikki Ports (May 03, 2009 3:16 PM)  Serial Vital Signs/Assessments:  Time      Position  BP       Pulse  Resp  Temp     By           R Arm     125/78                         Vikki Ports   Physical Exam  General:  Pt is alert and oriented, thin, African-American male, in no acute distress. HEENT: normal Neck: normal carotid upstrokes without bruits, JVP normal Lungs: CTA CV: RRR without murmur or gallop Abd: soft, NT, positive BS, no bruit, no organomegaly Ext: no clubbing, cyanosis, or edema. 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 His anatomy is not favorable for endovacular treatment. Recommend continue medical therapy and focus on a walking program. He should be on antiplatelet Rx and he is intolerant to ASA. Will give him information for the [facility] assistance program so he can obtain plavix.  Will repeat ABI's.  Problem # 2:  HYPERTENSION (ICD-401.9)  The following medications were removed from the  medication list:    Hydrochlorothiazide 25 Mg Tabs (Hydrochlorothiazide) .Marland Kitchen... Take 1 pill by mouth daily. His updated medication list for this problem includes:    Norvasc 10 Mg Tabs (Amlodipine besylate) .Marland Kitchen... Take 1 tablet by mouth once a day for blood pressure  BP today: 121/75 Prior BP: 151/82 (04/18/2009)  Labs Reviewed: K+: 3.9 (03/24/2009) Creat: : 0.98 (03/24/2009)   Chol: 173 (03/24/2009)   HDL: 33 (03/24/2009)   LDL: 116 (03/24/2009)   TG: 120 (03/24/2009)  Other Orders: Arterial Duplex Lower Extremity (Arterial Duplex Low)  Patient Instructions: 1)  Your physician wants you to follow-up in:   1 YEAR. You will  receive a reminder letter in the mail two months in advance. If you don't receive a letter, please call our office to schedule the follow-up appointment. 2)  Your physician has requested that you have an ankle brachial index (ABI). During this test an ultrasound and blood pressure cuff are used to evaluate the arteries that supply the arms and legs with blood. Allow thirty minutes for this exam. There are no restrictions or special instructions. 3)  Plavix assistance form given to the patient for completion.

## 2010-03-02 NOTE — Assessment & Plan Note (Signed)
Summary: NP,L SHOULDER PAIN,MC   Vital Signs:  Patient profile:   60 year old male BP sitting:   133 / 80  Vitals Entered By: Lillia Pauls CMA (July 05, 2009 9:36 AM)  Primary Provider:  Lollie Sails MD   History of Present Illness: Pt has been having pain in the Left shoulder for about 3 years per patient. He says it is worse at night and when he is trying to lift things. It is better when he uses Tramadol (needs to use it about every 4 hours) and feels better after being in a sling. Pt is right handed. He can not drive with his left hand. He does have back pain and has been having Rt shoulder pain as well, but his main concern today is his left shoulder.    Attending note:  Agree with resident eval.  the patient denies any specific injury that he can recall, this has been ongoing for approximately 3 years. Does have a dull ache, it does wake him up at night. He is never had any traumatic  dislocation. Never had any known operative intervention in the affected shoulder. Never had any traumatic fracture in the affected side.  He is limited in the overhead plane and also is limited in flexion as well as internal and external rotation.  MRI, left shoulder: Independently reviewed. There is some T2 signal uptake in the patient's supraspinatus  and around the  insertion point. There is also some subacromial bursitis. Additionally, there is some  T2 signal and bony hypertrophy at the acromioclavicular  joint. I am not clear about the radiological interpretation about a potential SLAP lesion - I am not clear that correlates clinically.  Hannah Beat MD  July 05, 2009 5:56 PM   Allergies: 1)  ! Asa  Past History:  Past medical, surgical, family and social histories (including risk factors) reviewed, and no changes noted (except as noted below).  Past Medical History: Reviewed history from 05/02/2009 and no changes required. 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 Left Lumbar laminectomy-1989, 1991 Microdisectomy-? location  Appendectomy R hammer toe-2nd and 5th digit  Family History: Reviewed history from 05/02/2009 and no changes required.   He does not know his parents' history, and there is no   specific history of coronary or peripheral arterial disease.      Social History: Reviewed history from 05/02/2009 and no changes required.  The patient is separated from his wife.  He lives   locally in [place].  He has three grown children.  He quit smoking  cigarettes 20 years ago and quit alcohol approximately 20 years ago as  well.  He reports marijuana use.      Review of Systems       no fever, chills, sweats. Tolerating p.o. He does not have any radiculopathy in the affected shoulder Otherwise, the pertinent positives and negatives are listed above and in the HPI, otherwise a full review of systems has been reviewed and is negative unless  noted positive.   Physical Exam  General:  Well-developed,well-nourished,in no acute distress; alert,appropriate and cooperative throughout examination Head:  Normocephalic and atraumatic without obvious abnormalities. No apparent alopecia or balding. Ears:  no external deformities.   Nose:  no external deformity.   Lungs:  normal  respiratory effort.     Shoulder/Elbow Exam  General:    Well-developed, well-nourished, normal body habitus; no deformities, normal grooming.    Skin:    Intact, no scars, lesions, rashes, cafe au lait spots or bruising.    Inspection:    Inspection is normal.    Palpation:    tenderness L-suprascapular:   Shoulder Exam:    Left:    Inspection/Palpation:  strength is equal bilaterally, pt has 5 degree loss of rotation on the left shoulder, pain with Neers test, pain with Hawkin's test, passive abduction is about 120 degrees. Pt complains of pain with every test done on left shoulder. Pt has tenderness around glenohurmeral joint but not it one specific spot.     speeds and Yergason's tests are negative. No pain at the bicipital groove. Minimal pain at the supraspinatus insertion. Patient does have 4+/5 internal and external range of motion. Positive a.c. crossover compression test   Impression & Recommendations:  Problem # 1:  ADHESIVE CAPSULITIS, LEFT (ICD-726.0)  Secondary adhesive capsulitis. Plan to do injection today, will refer to PT for exercises to increase range of motion in left shoulder.  Formal PT. ROM, RTC, scap stabilization. f/u 4-6 wks  SubAC Injection Verbal consent was obtained from the patient. Risks, benefits, and alternatives were explained. Patient prepped with Betadine and Ethyl Chloride used for anesthesia. The subacromial space was injected using the posterior approach. The patient tolerated the procedure well and had decreased pain post injection. No complications. Injection: 4 cc of Lidocaine 1% and 1cc of Kenalog 40 mg. 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 Take 1 tablet by mouth once a day for blood pressure 2)  Zocor 80 Mg Tabs (Simvastatin) .... Take 1 pill by mouth daily. 3)  Ultram 50 Mg Tabs (Tramadol hcl) .... Take 1 pill by mouth as needed for pain up to 6 hourly. 4)  Pletal 100 Mg Tabs (Cilostazol) .... Take 1 pill by mouth daily 5)  Plavix 75 Mg Tabs (Clopidogrel bisulfate) .... Take one tablet by mouth daily

## 2010-03-02 NOTE — Miscellaneous (Signed)
Summary: CONSENT FOR AUDIO-VISUAL   CONSENT FOR AUDIO-VISUAL   Imported By: Margie Billet 09/28/2009 15:24:40  _____________________________________________________________________  External Attachment:    Type:   Image     Comment:   External Document

## 2010-03-02 NOTE — Progress Notes (Signed)
Summary: needs to reorder plavix  Phone Note Call from Patient Message from:  Patient on February 01, 2010 2:15 PM  Refills Requested: Medication #1:  NORVASC 10 MG TABS Take 1 tablet by mouth once a day for blood pressure Caller: Patient [phone] Reason for Call: Talk to Nurse Summary of Call: pt needs to reorder plavix Initial call taken by: Glynda Jaeger,  February 01, 2010 2:16 PM  Follow-up for Phone Call        Plavix ordered to [facility] will arrive in about one week. Patient notified. Vikki Ports  February 01, 2010 3:43 PM      Appended Document: needs to reorder plavix Plavix arrived in the office and placed at the front desk for pick-up.  Order # [id number], Customer PO [phone].  Pt aware medication placed at the front desk.  I clarified if pt needs Rx for norvasc and he said no.

## 2010-03-02 NOTE — Progress Notes (Signed)
Summary: Pt need Plavix ordered  Phone Note Call from Patient Call back at Home Phone [phone]   Caller: Patient Summary of Call: Pt need his medication Plavix Initial call taken by: Judie Grieve,  July 19, 2009 3:17 PM  Follow-up for Phone Call        Plavix ordered from [facility] and will arrive in 7-10 business days. Julieta Gutting, RN, BSN  July 19, 2009 3:29 PM     Appended Document: Pt need Plavix ordered Pt aware that plavix has arrived and was placed at the front desk.  Order #[id number], Customer PO [phone], Customer [phone].

## 2010-03-02 NOTE — Progress Notes (Signed)
Summary: Refill/gh  Phone Note Refill Request Message from:  Fax from Pharmacy on July 08, 2009 2:07 PM  Refills Requested: Medication #1:  ULTRAM 50 MG  TABS take 1 pill by mouth as needed for pain up to 6 hourly.   Last Refilled: 06/02/2009  Method Requested: Electronic Initial call taken by: Angelina Ok RN,  July 08, 2009 2:07 PM  Follow-up for Phone Call       Follow-up by: Jason Coop MD,  July 08, 2009 5:58 PM    Prescriptions: ULTRAM 50 MG  TABS (TRAMADOL HCL) take 1 pill by mouth as needed for pain up to 6 hourly.  #120 x 0   Entered and Authorized by:   Jason Coop MD   Signed by:   Jason Coop MD on 07/08/2009   Method used:   Electronically to        [facility] (retail)       [address]       [place], [place]  [address]       Ph: [phone]       Fax: [phone]   RxID:   [id number]

## 2010-03-02 NOTE — Progress Notes (Signed)
Summary: Plavix  Phone Note Outgoing Call   Call placed by: Julieta Gutting, RN, BSN,  November 02, 2009 11:47 AM Call placed to: Patient Summary of Call: I spoke with the pt and made him aware that Plavix has arrived. Plavix placed at the front desk for pick-up.  Customer PO #[phone], Order #[id number].  Initial call taken by: Julieta Gutting, RN, BSN,  November 02, 2009 11:47 AM

## 2010-03-02 NOTE — Progress Notes (Signed)
Summary: refill meds  Phone Note Refill Request Message from:  Patient on February 01, 2010 2:08 PM  Refills Requested: Medication #1:  ZOCOR 40 MG TABS take 1 pill by mouth daily.  Medication #2:  NORVASC 10 MG TABS Take 1 tablet by mouth once a day for blood pressure  Medication #3:  PLETAL 100 MG TABS take 1 pill by mouth twice daily  Medication #4:  PLAVIX 75 MG TABS Take one tablet by mouth daily. [facility] on [address]    Method Requested: Fax to Local Pharmacy Initial call taken by: Lorne Skeens,  February 01, 2010 2:09 PM  Follow-up for Phone Call        Rx faxed to pharmacy. Vikki Ports  February 01, 2010 3:33 PM     Prescriptions: NORVASC 10 MG TABS (AMLODIPINE BESYLATE) Take 1 tablet by mouth once a day for blood pressure  #30 x 6   Entered by:   Vikki Ports   Authorized by:   Norva Karvonen, MD   Signed by:   Vikki Ports on 02/01/2010   Method used:   Faxed to ...       [facility]       [address]       [place], [place]  [address]       Ph: [phone]       Fax: [phone]   RxID:   [id number] PLETAL 100 MG TABS (CILOSTAZOL) take 1 pill by mouth twice daily  #60 x 6   Entered by:   Vikki Ports   Authorized by:   Norva Karvonen, MD   Signed by:   Vikki Ports on 02/01/2010   Method used:   Faxed to ...       [facility]       [address]       [place], [place]  [address]       Ph: [phone]       Fax: [phone]   RxID:   [id number] ZOCOR 40 MG TABS (SIMVASTATIN) take 1 pill by mouth daily.  #30 x 6   Entered by:   Vikki Ports   Authorized by:   Norva Karvonen, MD   Signed by:   Vikki Ports on 02/01/2010   Method used:   Faxed to ...       [facility]       [address]       [place], [place]  [address]       Ph: [phone]       Fax: [phone]   RxID:   [id number]

## 2010-03-03 NOTE — Letter (Signed)
Summary: [facility] PT referral form  [facility] PT referral form   Imported By: Marily Memos 07/05/2009 13:41:30  _____________________________________________________________________  External Attachment:    Type:   Image     Comment:   External Document

## 2010-04-13 LAB — URINE MICROSCOPIC-ADD ON

## 2010-04-13 LAB — URINALYSIS, ROUTINE W REFLEX MICROSCOPIC
Glucose, UA: NEGATIVE mg/dL
Nitrite: NEGATIVE
Protein, ur: NEGATIVE mg/dL

## 2010-05-02 ENCOUNTER — Telehealth: Payer: Self-pay | Admitting: Cardiovascular Disease

## 2010-05-02 NOTE — Telephone Encounter (Signed)
Pt wife states pt needs all of his heart meds to be refill. Pt wife does not know names of meds, please call it in to [facility] # [phone]

## 2010-05-03 ENCOUNTER — Other Ambulatory Visit: Payer: Self-pay | Admitting: *Deleted

## 2010-05-03 MED ORDER — AMLODIPINE BESYLATE 10 MG PO TABS: 10.0000 mg | ORAL_TABLET | Freq: Every day | ORAL | Status: DC

## 2010-05-03 MED ORDER — CILOSTAZOL 100 MG PO TABS: 100.0000 mg | ORAL_TABLET | Freq: Two times a day (BID) | ORAL | Status: DC

## 2010-05-03 MED ORDER — SIMVASTATIN 40 MG PO TABS: 40.0000 mg | ORAL_TABLET | Freq: Two times a day (BID) | ORAL | Status: DC

## 2010-05-03 MED ORDER — CLOPIDOGREL BISULFATE 75 MG PO TABS: 75.0000 mg | ORAL_TABLET | Freq: Every day | ORAL | Status: DC

## 2010-05-16 ENCOUNTER — Other Ambulatory Visit: Payer: Self-pay | Admitting: Internal Medicine

## 2010-05-16 DIAGNOSIS — I10 Essential (primary) hypertension: Secondary | ICD-10-CM

## 2010-05-26 ENCOUNTER — Encounter: Payer: Self-pay | Admitting: Cardiovascular Disease

## 2010-05-27 ENCOUNTER — Encounter: Payer: Self-pay | Admitting: Cardiovascular Disease

## 2010-05-29 ENCOUNTER — Encounter: Payer: Self-pay | Admitting: Cardiovascular Disease

## 2010-05-29 ENCOUNTER — Ambulatory Visit (INDEPENDENT_AMBULATORY_CARE_PROVIDER_SITE_OTHER): Payer: Self-pay | Admitting: Cardiovascular Disease

## 2010-05-29 DIAGNOSIS — I739 Peripheral vascular disease, unspecified: Secondary | ICD-10-CM

## 2010-05-29 DIAGNOSIS — E785 Hyperlipidemia, unspecified: Secondary | ICD-10-CM

## 2010-05-29 DIAGNOSIS — E7800 Pure hypercholesterolemia, unspecified: Secondary | ICD-10-CM

## 2010-05-29 DIAGNOSIS — I70219 Atherosclerosis of native arteries of extremities with intermittent claudication, unspecified extremity: Secondary | ICD-10-CM

## 2010-05-29 MED ORDER — SIMVASTATIN 20 MG PO TABS: 20.0000 mg | ORAL_TABLET | Freq: Every day | ORAL | Status: DC

## 2010-05-29 NOTE — Assessment & Plan Note (Signed)
He is also statin therapy. I don't see a contraindication and he does not report any adverse reaction. Since he is on amlodipine for blood pressure and has been treated with simvastatin in the past, I asked him to resume simvastatin 20 mg daily as this is the maximum dose when taking amlodipine. He will followup at the medicine clinic.

## 2010-05-29 NOTE — Assessment & Plan Note (Signed)
The patient is stable with no change in his claudication symptoms. He does have lifestyle limiting symptoms, but his anatomy is unfavorable for PTA and stenting and his symptoms and noninvasive testing does not support the need for peripheral bypass surgery. I would recommend continuing with medical therapy and risk factor modification. He will continue on Plavix because he is intolerant to aspirin. He'll also continue on Pletal. The patient was encouraged to continue with his walking program. I would like to see him back in followup in one year.

## 2010-05-29 NOTE — Patient Instructions (Signed)
Your physician wants you to follow-up in: 1 YEAR.  You will receive a reminder letter in the mail two months in advance. If you don't receive a letter, please call our office to schedule the follow-up appointment.  Your physician has recommended you make the following change in your medication: START Simvastatin 20mg  take one by mouth daily

## 2010-05-29 NOTE — Progress Notes (Signed)
HPI:  This is a 60 year old gentleman presented for follow up of peripheral arterial disease. The patient has long total superficial femoral artery occlusions bilaterally with reduced ankle-brachial indices. His last ABIs were in April of 2011 and they were 68% on the right and 65% on the left.  The patient is stable from a vascular standpoint. He reports bilateral calf pain after walking for 5 minutes. He stops and rests and is able to continue can walk for a total of 20 minutes. He denies rest pain. He denies hip, buttock, or thigh pain. He reports no change in his symptoms since last year. He denies chest pain or dyspnea. He has no other complaints at this time.  Outpatient Encounter Prescriptions as of 05/29/2010  Medication Sig Dispense Refill  . amLODipine (NORVASC) 10 MG tablet Take 1 tablet (10 mg total) by mouth daily.  30 tablet  2  . cilostazol (PLETAL) 100 MG tablet Take 1 tablet (100 mg total) by mouth 2 (two) times daily.  60 tablet  11  . clopidogrel (PLAVIX) 75 MG tablet Take 1 tablet (75 mg total) by mouth daily.  30 tablet  6  . simvastatin (ZOCOR) 20 MG tablet Take 1 tablet (20 mg total) by mouth at bedtime.  30 tablet  11  . DISCONTD: simvastatin (ZOCOR) 40 MG tablet Take 1 tablet (40 mg total) by mouth 2 (two) times daily.  60 tablet  6    Allergies  Allergen Reactions  . Aspirin     REACTION: GI UPSET    Past Medical History  Diagnosis Date  . Lumbago   . Latent tuberculosis   . Chest pain   . Intermittent claudication     bilateral  . Hyperlipidemia   . Hypertension   . Foot pain     bilateral  . Shoulder pain   . Arthropathy     shoulder  . Chronic cough   . Alcohol abuse     ROS: Negative except as per HPI  BP 134/70  Pulse 88  Resp 18  Ht 5\' 11"  (1.803 m)  Wt 176 lb (79.833 kg)  BMI 24.55 kg/m2  PHYSICAL EXAM: Pt is alert and oriented, NAD HEENT: normal Neck: JVP - normal, carotids 2+= without bruits Lungs: CTA bilaterally CV: RRR without  murmur or gallop Abd: soft, NT, Positive BS, no hepatomegaly Ext: no C/C/E, posterior tibial pulse one plus on the right and trace on the left. Feet are warm bilaterally. Skin: warm/dry no rash  ASSESSMENT AND PLAN:

## 2010-06-05 ENCOUNTER — Telehealth: Payer: Self-pay

## 2010-06-05 NOTE — Telephone Encounter (Signed)
Pt approved for Plavix assistance program.  Medication placed at the front desk for pick-up and pt aware.  Order #[id number], Customer PO #[phone].

## 2010-06-13 ENCOUNTER — Other Ambulatory Visit: Payer: Self-pay | Admitting: Cardiovascular Disease

## 2010-06-13 NOTE — Assessment & Plan Note (Signed)
[facility]                            CARDIOLOGY OFFICE NOTE   NAME:[name], [name]                           MRN:          [id number]  DATE:07/15/2007                            DOB:          [birth year]    Stephen [name] is referred for the evaluation of the shoulder and arm pain and  some burning in his chest.  He has been bothered by a chronic pain in  his shoulder and his arms.  However, he has had some chest burning and  at one time had some nausea, vomiting, and diaphoresis.  The patient  smokes.  He does not have coronary disease documented in the past.  He  is not currently complaining of exertional angina.  He does complain of  claudication.  The patient in the past had ABIs done and these were  abnormal.  He needs full arterial Dopplers.   There is no history of diabetes.  There is a history of hypertension.  As mentioned, the patient does smoke.  There is a family history of  coronary disease.   PAST MEDICAL HISTORY:   ALLERGIES:  ASPIRIN.  This will need to be assessed further in the  future.   MEDICATIONS:  1. Simvastatin 20.  2. Norvasc 10.  3. Tramadol 50 q.6.  4. Naproxen.   OTHER MEDICAL PROBLEMS:  See the list below.   SOCIAL HISTORY:  As mentioned, the patient does smoke.   FAMILY HISTORY:  Positive for coronary disease.   REVIEW OF SYSTEMS:  He has some fatigue.  He has some reflux and  arthritis.  Otherwise, review of systems is negative.   PHYSICAL EXAMINATION:  VITAL SIGNS:  Blood pressure is 132/76 with a  pulse of 64.  Weight is 179 pounds.  GENERAL:  The patient is oriented to person, time, and place.  Affect is  normal.  He is here with a family member.  HEENT:  No xanthelasma.  He has normal extraocular motion.  NECK:  There are no carotid bruits.  There is no jugular venous  distention.  LUNGS:  Clear.  Respiratory effort is not labored.  CARDIAC:  S1and S2.  There are no clicks or significant murmurs.  ABDOMEN:   Soft.  EXTREMITIES:  He has decreased distal pulses.  There is no peripheral  edema.  MUSCULOSKELETAL:  There are no major musculoskeletal deformities.  SKIN:  He does have a scar on the left side of his nose that is old.   LABORATORY DATA:  The patient has an EKG today that is unchanged showing  moderate LVH.  He had a 2-D echo in June 2009.  This shows ejection  fraction of 60-70% with no focal wall motion abnormalities and his  valvular function is normal.   PROBLEMS:  1. Normal left ventricular function by echo.  2. Hypertension, treated.  3. Pain in his legs with walking, which is almost certainly      claudication.  He needs a full arterial Doppler.  4. History of lumbar laminectomy, right and left arm pain.  I suspect  that this is related to disk disease.  5. Some chest discomfort and arm pain.  We need to rule out cardiac      ischemia, and an adenosine Myoview will be done.  6. Ongoing cigarette use.  I talked with him and encouraged him to try      to begin to stop.  At this point, he did not seem particularly      interested in this.   The patient has [facility] from [facility].  The reports show that he had studies  of his shoulder and his spine showing significant abnormalities.  This  could be the basis of his problems as far as his shoulders are  concerned, and he could have further workup of this also.  I will see  him for followup.     Stephen Abed, MD, [facility]  Electronically Signed    JDK/MedQ  DD: 07/15/2007  DT: 07/16/2007  Job #: [id number]   cc:   Stephen Stable, MD

## 2010-06-13 NOTE — Progress Notes (Signed)
[facility]                        PERIPHERAL VASCULAR OFFICE NOTE   NAME:Stephen [name]                           MRN:          [id number]  DATE:08/27/2007                            DOB:          1950-07-16    PRIMARY CARDIOLOGIST:  Luis Abed, MD, [facility]   REASON FOR CONSULTATION:  Intermittent claudication.   HISTORY OF PRESENT ILLNESS:  Stephen [name] was seen as an outpatient at the  [facility] Peripheral Vascular Office on August 27, 2007, for evaluation of  lower extremity peripheral arterial disease and claudication.  He is a  60 year old gentleman who describes several years of bilateral calf pain  with walking.  He describes his pain as a burning that predictably  occurs with walking or exercise.  His symptoms resolve after a few  minutes of rest.  He is a difficult historian, but his symptoms appear  to be significantly lifestyle limiting.  He is able to walk at a slow  pace.  However, even activities such as grocery shopping require him to  rest because of leg pain.  He has no rest symptoms.  He reports  stability of his symptoms over a several-year period.  He denies  ischemic ulcerations, previous stroke, amaurosis, or TIA symptoms.   He underwent a lower extremity duplex scan on July 23, 2007.  This  showed ABIs of 0.55 bilaterally.  There was total occlusion of the right  superficial femoral artery from the ostium all the way to the popliteal  artery.  The left SFA was also occluded 12 cm from its origin into the  popliteal artery.  Both legs are supplied by collateral blood vessels.   PAST MEDICAL HISTORY:  1. Peripheral arterial discrete disease as described above.  2. Essential hypertension.  3. History of lumbar laminectomy and chronic low back pain.  4. History of alcoholism with alcohol cessation times many years.  5. Dyslipidemia.   MEDICATIONS:  1. Simvastatin 20 mg daily.  2. Norvasc 10 mg daily.  3. Tramadol 50 mg every 6 h.  4. Naproxen 375 mg twice daily.   ALLERGIES:  Aspirin.   SOCIAL HISTORY:  The patient is separated from his wife.  He lives  locally in [place].  He has three grown children.  He quit smoking  cigarettes 20 years ago and quit alcohol approximately 20 years ago as  well.  He reports marijuana use.   FAMILY HISTORY:  He does not know his parents' history, and there is no  specific history of coronary or peripheral arterial disease.   REVIEW OF SYSTEMS:  Pertinent positives included generalized fatigue,  gastroesophageal reflux, and osteoarthritis.  All other systems were  reviewed and are negative except as detailed.   PHYSICAL EXAMINATION:  GENERAL:  The patient is alert and oriented.  He  has a peculiar affect.  He is in no distress.  VITAL SIGNS:  Weight 174 pounds, blood pressure 128/78 on the right  116/78 on the left, heart rate 80, respiratory rate 16.  HEENT:  Normal.  NECK:  Normal carotid upstrokes without bruits.  Jugulovenous pressure  is normal.  LUNGS:  Clear to auscultation bilaterally.  HEART:  The apex is discrete nondisplaced.  No right ventricular heave  or lift.  Regular rate and rhythm without murmurs or gallops.  ABDOMEN:  Soft, nontender.  No organomegaly.  No abdominal bruits.  BACK:  No CVA tenderness.  EXTREMITIES:  Femoral pulses 2+.  There are no bruits.  Popliteal,  dorsalis pedis, and posterior tibial pulses are not palpable.  There is  no clubbing, cyanosis, or edema in the extremities.  SKIN:  Warm and dry.  There are no rashes.  There are no areas of skin  breakdown or ulceration.  NEUROLOGIC:  Cranial nerves II through XII are intact.  Strength is  intact and equal.   ASSESSMENT:  A 60 year old gentleman with lower extremity peripheral  arterial disease and classic symptoms of intermittent claudication.  Mr.  [name] has long total occlusions of his superficial femoral arteries.  This presents an anatomic challenge for percutaneous intervention  as  long term results are generally not durable.  In the setting of stable  symptoms, I would recommend a therapeutic trial of Pletal along with a  walking program.  The patient was started on Pletal 100 mg daily for 2  weeks then 100 mg twice daily.  I would like to see him back in 4 months  for follow up to see if he has had a good clinical response and to try  to get a better assessment of his limitation.  If he develops severe  limitation, we will need to consider angiography, but at this time, I  would favor medical therapy for reasons outlined above.  He clearly has  no signs of resting ischemia or threatened limb.  I had a long  discussion with him today regarding the diagnosis of PAD, and I would  anticipate stable symptoms in this gentleman.     Veverly Fells. Excell Seltzer, MD  Electronically Signed    MDC/MedQ  DD: 08/27/2007  DT: 08/28/2007  Job #: [id number]   cc:   Luis Abed, MD, [facility]

## 2010-06-13 NOTE — Assessment & Plan Note (Signed)
[facility]                            CARDIOLOGY OFFICE NOTE   NAME:Sipos, Jerolyn Shin                           MRN:          [id number]  DATE:10/01/2007                            DOB:          September 13, 1950    Mr. Faucett is doing well.  I had seen him in June 2009.  Dr. Excell Seltzer has  seen him for peripheral vascular evaluation in July 2009.   The patient has significant claudication.  Dr. Excell Seltzer has started him on  Pletal and will be following him up.  When I saw him, we were concerned  about the chest discomfort.  A Myoview scan was done on July 23, 2007,  and it revealed no significant abnormalities.  There was no ischemia and  the ejection fraction was normal.  He has not been having any  significant return of the chest pain.   PAST MEDICAL HISTORY:   ALLERGIES:  There is question of allergy to ASPIRIN.   MEDICATIONS:  Simvastatin, Norvasc, tramadol, naproxen, and Pletal.   OTHER MEDICAL PROBLEMS:  See the list below.   REVIEW OF SYSTEMS:  He is stable today.  His review of systems is  negative.   PHYSICAL EXAMINATION:  VITAL SIGNS:  Weight is 174 pounds.  Blood  pressure is 124/66 with a pulse of 95.  GENERAL:  The patient is oriented to person, time, and place.  Affect is  normal.  HEENT:  Reveals no xanthelasma.  He has normal extraocular motion.  NECK:  There are no carotid bruits.  There is no jugular venous  distention.  LUNGS:  Clear.  Respiratory effort is not labored.  CARDIAC:  Reveals S1 and S2.  There are no clicks or significant  murmurs.  ABDOMEN:  Soft.  EXTREMITIES:  No significant peripheral edema.   PROBLEMS:  1. History of normal left ventricular function.  2. Hypertension treated.  3. Leg pain with peripheral vascular disease as outlined by Dr. Excell Seltzer      and he is on Pletal at this time.  4. History of lumbar laminectomy.  5. History of right and left arm pain that may be related to disk      disease.  6. Normal Myoview  scan with no obvious ischemia.  7. Ongoing cigarette use.  8. History of alcohol and alcoholism in the past with alcohol      cessation many years ago.   Cardiac status is stable.  He does not need further cardiac workup.  He  needs to follow up with Dr. Excell Seltzer concerning his peripheral vascular  disease.     Luis Abed, MD, [facility]  Electronically Signed    JDK/MedQ  DD: 10/01/2007  DT: 10/02/2007  Job #: [id number]   cc:   Mariea Stable, MD

## 2010-06-13 NOTE — Progress Notes (Signed)
[facility]                        PERIPHERAL VASCULAR OFFICE NOTE   NAME:GILLJerolyn [name]                           MRN:          [id number]  DATE:01/06/2008                            DOB:          1950/10/12    REASON FOR VISIT:  Lower extremity peripheral arterial disease.   HISTORY OF PRESENT ILLNESS:  [name] is a 60 year old gentleman with  intermittent claudication.  He describes bilateral calf pain with  walking less than 100 yards.  He describes the pain is a burning in  the calf area that occurs with any degree of exercise.  His symptoms  resolved after a few minutes of rest.  Symptoms have been stable for  some time.  He is also complaining of aches and pains all over.  He has  no rest pain and has had no problems with ischemic ulcerations.  He was  initially seen here in July and started on Pletal.  He has not had any  improvement in his symptoms since that time.   MEDICATIONS:  1. Simvastatin 20 mg daily.  2. Norvasc 10 mg daily.  3. Tramadol 50 mg every 6 hours.  4. Naproxen 375 mg b.i.d.  5. Pletal 100 mg b.i.d.   ALLERGIES:  ASPIRIN.   PHYSICAL EXAMINATION:  GENERAL:  The patient is alert and oriented.  He  is in no acute distress.  VITAL SIGNS:  Weight is 180 pounds, blood pressure 138/80, heart rate  88, respiratory rate 12.  HEENT:  Normal.  NECK:  Normal carotid upstrokes, no bruits.  JVP normal.  LUNGS:  Clear bilaterally.  HEART:  Regular rate and rhythm.  No murmurs or gallops.  ABDOMEN:  Soft, nontender, no organomegaly.  EXTREMITIES:  Femoral pulses 2+.  Pedal pulses not palpable.  There is  no clubbing, cyanosis, or edema.  SKIN:  Warm and dry.  There are no rashes.   ASSESSMENT:  Lower extremity peripheral arterial disease with lifestyle-  limiting intermittent claudication.  ABIs from July 23, 2007, was 0.55  bilaterally.  There are long total occlusions of both superficial  femoral arteries.  The patient's anatomy  is not favorable for  endovascular treatment.  His symptoms have been stable for some time  period.  I do not think his symptoms  warrant bilateral surgical bypass.  I discussed this with the patient  and he is not interested in an  evaluation for Vascular Surgery.  I will continue his current medical  program without changes.  I will plan on seeing him back in 1 year for  followup.     Stephen [name]. Stephen Seltzer, MD  Electronically Signed    MDC/MedQ  DD: 01/06/2008  DT: 01/07/2008  Job #: [id number]

## 2010-06-16 ENCOUNTER — Encounter: Payer: Self-pay | Admitting: Internal Medicine

## 2010-06-16 NOTE — Op Note (Signed)
NAMEDIAZ, CRAGO                  ACCOUNT NO.:  [id number]   MEDICAL RECORD NO.:  [id number]          PATIENT TYPE:  OIB   LOCATION:  5011                         FACILITY:  [facility]   PHYSICIAN:  Mark C. Ophelia Charter, M.D.    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   OPERATION PERFORMED:  Re-exploration, microdiskectomy, right L5-S1, removal  of foraminal herniated nucleus pulposus.   SURGEON:  Mark C. Ophelia Charter, M.D.   ASSISTANT:  Johny Shears, RN, FA   ANESTHESIA:  General orotracheal anesthesia.   ESTIMATED BLOOD LOSS:  100 cc   INDICATIONS FOR PROCEDURE:  This 60 year old male has had at least three  lumbar laminectomies in the past for disk herniations including exploration  on the right at 4-5 and he has also been explored at 5-1.  The patient has  had right leg weakness, numbness and positive tension signs at 45 degrees  with large foraminal disk.  He has an incidental left L4-5 herniated nucleus  pulposus which is asymptomatic.   DESCRIPTION OF PROCEDURE:  After induction of general anesthesia,  preoperative Ancef prophylaxis, placement on the Andrews frame, standard  DuraPrep, usual laminectomy sheets, drapes, Betadine Vi-drapes, 18 gauge  needle, cross table lateral x-ray confirming that the needle was just  inferior to the 5-1 disk space.  An incision was made a few millimeters to  the right of midline incorporating an old scar.  Scar tissue was present and  had to be peeled off of the sacral lamina as well as the lamina of L5 on the  right.  Laminotomy was performed and an hour of dissection followed this  removing chunks of ligament in the gutter doing a laminotomy on the right at  S1 until the nerve root was  identified socked in a large amount of scar  tissue.  There was a very large fragmented disk which appeared old which was  out in the foramina which was actually compressing the L5 nerve root from  above.  There was ridging of bone at the end plates that came up 1 cm into  the canal on both sides and these had to be removed with a Kerrison rongeur  and straight pituitaries as well as curettes picked piece by piece.  The  nerve root actually ran in a tunnel of bone coming up off the end plate.  This had to be removed and with this was the large mass of disk herniation  with a combination of scar tissue intermixed with fresh pieces of disk and  some pieces of end palate. Only the micropituitary could originally enter  the original disk space.  Once spurs were removed, regular pituitary was  introduced and large chunks of disk were removed.  Once the foramina was  completely decompressed, the nerve root below was found finally after the  disk fragments  were removed and was free.  Nerve roots were free.  Hockey  stick could be passed anteriorly with the ridging of bone present but no  remaining areas of compression.  Irrigation with saline solution, closure with 0 Vicryl.  Dura was intact.  2-  0 Vicryl in the subcutaneous tissue after 0 in the fascia and skin staple  closure, Marcaine infiltration and postop dressing.  Needle and instrument  counts were correct.      MCY/MEDQ  D:  05/22/2004  T:  05/23/2004  Job:  [id number]

## 2010-07-31 ENCOUNTER — Ambulatory Visit (INDEPENDENT_AMBULATORY_CARE_PROVIDER_SITE_OTHER): Payer: Medicare Other | Admitting: Internal Medicine

## 2010-07-31 ENCOUNTER — Encounter: Payer: Self-pay | Admitting: Internal Medicine

## 2010-07-31 ENCOUNTER — Other Ambulatory Visit: Payer: Self-pay | Admitting: Cardiovascular Disease

## 2010-07-31 VITALS — BP 157/79 | HR 67 | Temp 97.4°F | Ht 71.0 in | Wt 179.2 lb

## 2010-07-31 DIAGNOSIS — I739 Peripheral vascular disease, unspecified: Secondary | ICD-10-CM

## 2010-07-31 DIAGNOSIS — M545 Low back pain, unspecified: Secondary | ICD-10-CM

## 2010-07-31 DIAGNOSIS — R251 Tremor, unspecified: Secondary | ICD-10-CM

## 2010-07-31 DIAGNOSIS — F1011 Alcohol abuse, in remission: Secondary | ICD-10-CM

## 2010-07-31 DIAGNOSIS — I10 Essential (primary) hypertension: Secondary | ICD-10-CM

## 2010-07-31 DIAGNOSIS — R7611 Nonspecific reaction to tuberculin skin test without active tuberculosis: Secondary | ICD-10-CM

## 2010-07-31 DIAGNOSIS — E785 Hyperlipidemia, unspecified: Secondary | ICD-10-CM

## 2010-07-31 DIAGNOSIS — R079 Chest pain, unspecified: Secondary | ICD-10-CM

## 2010-07-31 DIAGNOSIS — R259 Unspecified abnormal involuntary movements: Secondary | ICD-10-CM

## 2010-07-31 LAB — LIPID PANEL
Cholesterol: 160 mg/dL (ref 0–200)
VLDL: 24 mg/dL (ref 0–40)

## 2010-07-31 LAB — CK: Total CK: 166 U/L (ref 7–232)

## 2010-07-31 MED ORDER — HYDROCHLOROTHIAZIDE 25 MG PO TABS: 25.0000 mg | ORAL_TABLET | Freq: Every day | ORAL | Status: DC

## 2010-07-31 NOTE — Assessment & Plan Note (Signed)
Pt is on simvastatin 20 mg po daily at bedtime. He stated that he tolerated well. Pt does c/o chronic right flank soreness worsening with movement. Will check CK  Pt last lipid panel was done a year ago. Will recheck it today.

## 2010-07-31 NOTE — Progress Notes (Signed)
Subjective:    Patient ID: Stephen [name], male    DOB: 10-10-1950, 60 y.o.   MRN: [id number]  HPI HPI: 60 year male with Past medical history of HTN, hyperlipidemia, bilateral lower extremities claudication and remote history of latent TB presents to the clinic for BP check up.  Pt initial BP 157/79, manual recheck 150/80 bilaterally. Pt stated that he has been complaint with his Norvasc. Denied any problem with dizziness, vision changes, or headache.   Pt chronic claudication has been managed by cardiology service Dr. [name]. Pt was put on plavix and Pletal for medical management. Pt is not candidate for PTA,stenting or bypass surgery per Dr. Excell Seltzer. Pt stated that he had not taken his pletal. Pt is able to walk 5-10 minutes at a time before he has to stop due to claudication pain. Resting make it better. Activity make it worse. No ulcer note at his legs.  Pt also c/o right hand and leg tremor at times for a while. Pt is noted to have mild resting tremors and his right hand tremor gets obvious when he reaches objects. Nothing make it worse or better.    Pt also stated that he has had chronic intermittent right flank soreness at times 5/10, sharp pain with no radiation. worsening with body movement. No chest pain or chest pressure, no palpitation. Pt does not have pain right now and he is ok with no treatment for now.   Past Medical History  Diagnosis Date  . Lumbago   . Latent tuberculosis   . Chest pain   . Intermittent claudication     bilateral  . Hyperlipidemia   . Hypertension   . Foot pain     bilateral  . Shoulder pain   . Arthropathy     shoulder  . Chronic cough   . Alcohol abuse    Family History  Problem Relation Age of Onset  . Other      He does not know his parents history  . Other      there is no specific hx of coronary arterial disease   History   Social History  . Marital Status: Legally Separated    Spouse Name: N/A    Number of Children: N/A  .  Years of Education: N/A   Occupational History  . Not on file.   Social History Main Topics  . Smoking status: Former Smoker    Types: Cigarettes    Quit date: 01/29/1990  . Smokeless tobacco: Not on file  . Alcohol Use: No     quit alcohol approximately 20 years ago  . Drug Use: 1 per week    Special: Marijuana     marijuana use about once a week. Former cocaine use. quit in the 1980's  . Sexually Active: Not on file   Other Topics Concern  . Not on file   Social History Narrative  . No narrative on file   Current Outpatient Prescriptions on File Prior to Visit  Medication Sig Dispense Refill  . amLODipine (NORVASC) 10 MG tablet Take 1 tablet (10 mg total) by mouth daily.  30 tablet  2  . clopidogrel (PLAVIX) 75 MG tablet Take 1 tablet (75 mg total) by mouth daily.  30 tablet  6  . simvastatin (ZOCOR) 20 MG tablet Take 1 tablet (20 mg total) by mouth at bedtime.  30 tablet  11  . cilostazol (PLETAL) 100 MG tablet Take 1 tablet (100 mg total) by mouth 2 (two)  times daily.  60 tablet  11   Allergies  Allergen Reactions  . Aspirin     REACTION: GI UPSET   Review of Systems Review of Systems  No headache, fever, or sore throat. No shortness of breath or dyspnea on exertion. No chest pain, chest pressure or palpitation No nausea, vomiting, or abdominal pain. No melena, diarrhea or incontinence. No muscle weakness.                   Denies depression. No appetite or weight changes.    Objective:   Physical Exam General: alert, well-developed, and cooperative to examination.  Mouth: pharynx pink and moist, no erythema, and no exudates.  Lungs: normal respiratory effort, no accessory muscle use, normal breath sounds, no crackles, and no wheezes. Heart: normal rate, regular rhythm, no murmur, no gallop, and no rub.  Abdomen: soft, non-tender, normal bowel sounds, no distention, no guarding, no rebound tenderness, no hepatomegaly, and no splenomegaly.  Msk: no joint swelling, no  joint warmth, and no redness over joints.  Pulses: 1+ DP/PT pulses bilaterally Extremities: No cyanosis, clubbing, edema Neurologic: alert & oriented X3, cranial nerves II-XII intact, strength normal in all extremities, sensation intact to light touch, and gait normal.  Right hand and leg mild resting tremor noted, right hand tremor obvious when reaching objects. All reflexes within normal limits. Skin: turgor normal and no rashes.     Assessment & Plan:

## 2010-07-31 NOTE — Assessment & Plan Note (Signed)
No coughing,shortness of breath, or night sweating noted. Negative lung assessment

## 2010-07-31 NOTE — Assessment & Plan Note (Addendum)
Pt stated that he still had intermittent claudication 5-6 times daily.  Bilateral lower extremities pedal pulses 1+. No open wound. Pt was prescribed Cilostazol(pletal) 100 mg po BID by Dr. [name] since April. Pt has been noncompliant with this medication and stated that he did not take it. [facility] Excell Seltzer office for more refills, pt is instructed the importance of taking the pletal. Pt states that he will take it.

## 2010-07-31 NOTE — Assessment & Plan Note (Signed)
Pt BP manual 150/80 bilaterally. Stated that he has been compliant with Norvasc. Will add HCTZ 25 mg po daily now and see pt in 2 weeks for BP management. Instructed pt to continue the low salt diet.

## 2010-07-31 NOTE — Assessment & Plan Note (Deleted)
No chest pain reported by pt

## 2010-07-31 NOTE — Assessment & Plan Note (Signed)
Pt stated that he has had intermittent right arm and leg tremor for a while. Pt is poor historian and unable to tell how long he had it. Neuro assessment done and no abnormality noted. I do not think that this is an acute problem. Will check TSH and follow up with pt in 2 weeks. Will check Cmet before considering the head CT with contrast. Instructed pt to bring family members with him next visit for further history gathering.

## 2010-07-31 NOTE — Assessment & Plan Note (Addendum)
Remission since 1988 per pt

## 2010-07-31 NOTE — Patient Instructions (Signed)
1. Please fill out the cilostazol (Pletal) 100mg  oral twice a day. [facility] Dr[facility] Excell Seltzer office to get the refills 2. Continue the low salt diet 3. Please report any change on right extremities tremors to our clinic 4. Follow up with clinic in 2 weeks, please bring family member with you.  5. New medication: HCTZ 25 mg oral daily in the am 6. Please change your Norvasc 10 mg oral daily to am as well.

## 2010-07-31 NOTE — Progress Notes (Deleted)
  Subjective:    Patient ID: Stephen [name], male    DOB: May 11, 1950, 60 y.o.   MRN: [id number] HPI: 60 year male with Past medical history of HTN, hyperlipidemia, bilateral lower extremities claudication and remote history of latent TB presents to the clinic for BP check up.  Pt BP ranges from   Hypertension  Flank Pain      Review of Systems  Genitourinary: Positive for flank pain.       Objective:   Physical Exam        Assessment & Plan:

## 2010-07-31 NOTE — Assessment & Plan Note (Deleted)
Not active problem.  

## 2010-08-01 LAB — COMPLETE METABOLIC PANEL WITH GFR
AST: 19 U/L (ref 0–37)
Albumin: 4.6 g/dL (ref 3.5–5.2)
Alkaline Phosphatase: 63 U/L (ref 39–117)
BUN: 13 mg/dL (ref 6–23)
Calcium: 9.4 mg/dL (ref 8.4–10.5)
Chloride: 106 mEq/L (ref 96–112)
Potassium: 3.9 mEq/L (ref 3.5–5.3)
Sodium: 141 mEq/L (ref 135–145)
Total Protein: 7.3 g/dL (ref 6.0–8.3)

## 2010-08-17 ENCOUNTER — Ambulatory Visit (INDEPENDENT_AMBULATORY_CARE_PROVIDER_SITE_OTHER): Payer: Medicare Other | Admitting: Internal Medicine

## 2010-08-17 ENCOUNTER — Encounter: Payer: Self-pay | Admitting: Internal Medicine

## 2010-08-17 DIAGNOSIS — I739 Peripheral vascular disease, unspecified: Secondary | ICD-10-CM

## 2010-08-17 DIAGNOSIS — R630 Anorexia: Secondary | ICD-10-CM

## 2010-08-17 DIAGNOSIS — I10 Essential (primary) hypertension: Secondary | ICD-10-CM

## 2010-08-17 DIAGNOSIS — R259 Unspecified abnormal involuntary movements: Secondary | ICD-10-CM

## 2010-08-17 DIAGNOSIS — Z0000 Encounter for general adult medical examination without abnormal findings: Secondary | ICD-10-CM

## 2010-08-17 DIAGNOSIS — R251 Tremor, unspecified: Secondary | ICD-10-CM

## 2010-08-17 NOTE — Progress Notes (Signed)
I saw patient and discussed his care with resident Dr. Li.  I agree with the clinical findings and plans as outlined in her note.  

## 2010-08-17 NOTE — Assessment & Plan Note (Addendum)
Mild stomach ache and anorexia x 2 days. No other c/o. Assessment benign. I think that he might have had some virus infection presenting with stomach symptoms. - instructed pt to drink plenty of fluid, eat light meals and soups to keep his hydration. -will call him tomorrow and check on him. -instructed him to come back to the clinic in one week if his s/s do not get better. -instructed pt come back or go to ER if he has any worsening of symptoms.

## 2010-08-17 NOTE — Patient Instructions (Addendum)
1. Continue your medications as scheduled. 2. Will get your colonoscopy report from Dr. Rob Bunting office. 3. Please keep hydration by drink plenty of fluid of your choice. Please eat light meals including soups for next few days.    Please come back to the clinic if your symptoms do not get better in one week. 4. Please come in at any time or go to ER if your symptoms are worsening or you start to vomit in the next few days. 5. Otherwise, follow up with me in one month.

## 2010-08-17 NOTE — Assessment & Plan Note (Signed)
Pt states that he has not had any tremors for last two weeks. Assessment benign. Will continue the current regimen.

## 2010-08-17 NOTE — Assessment & Plan Note (Signed)
Pt has been taking HCTZ since 07/31/10, and his BP is much better during this visit. - will continue the current regimen.

## 2010-08-17 NOTE — Assessment & Plan Note (Signed)
Colonoscopy done in 2008 with pathology report on [facility].  - will request the GI colonoscopy report from Dr. Christella Hartigan office.

## 2010-08-17 NOTE — Progress Notes (Signed)
Subjective:    Patient ID: Stephen [name], male    DOB: 04/24/1950, 60 y.o.   MRN: [id number]  HPI  This is a 60 yo pleasant gentleman with PMH of HTN, hyperlipidemia and bilateral LE claudication presents to the clinic for follow up visit.  Pt states that he has been compliant with his medications including HCTZ and Pletal. He feels much better and is able to ambulate more without any leg pain.   Pt reports stomach ache and anorexia x 2 days, no heartburn, vomiting or diarrhea.  No fever. Pt states that he has been able to drink enough fluid. However, he does not feel like eating any solid food. No sick contact. Pt reports that he does not have air conditioning at his home and he would only use fan during the hot weather.   Past Medical History  Diagnosis Date  . Lumbago   . Latent tuberculosis   . Chest pain   . Intermittent claudication     bilateral  . Hyperlipidemia   . Hypertension   . Foot pain     bilateral  . Shoulder pain   . Arthropathy     shoulder  . Chronic cough   . Alcohol abuse    Past Surgical History  Procedure Date  . Laminectomy [id number]    left lumbar   . Appendectomy   . Hammer toe surgery     right 2nd. and 5th   Family History  Problem Relation Age of Onset  . Other      He does not know his parents history  . Other      there is no specific hx of coronary arterial disease   Current Outpatient Prescriptions on File Prior to Visit  Medication Sig Dispense Refill  . amLODipine (NORVASC) 10 MG tablet Take 1 tablet (10 mg total) by mouth daily.  30 tablet  2  . cilostazol (PLETAL) 100 MG tablet TAKE 1 TABLET BY MOUTH TWICE A DAY  60 tablet  1  . clopidogrel (PLAVIX) 75 MG tablet Take 1 tablet (75 mg total) by mouth daily.  30 tablet  6  . hydrochlorothiazide 25 MG tablet Take 1 tablet (25 mg total) by mouth daily.  30 tablet  4  . simvastatin (ZOCOR) 20 MG tablet Take 1 tablet (20 mg total) by mouth at bedtime.  30 tablet  11   Allergies    Allergen Reactions  . Aspirin     REACTION: GI UPSET     Review of Systems  No headache, fever, or sore throat. No shortness of breath or dyspnea on exertion. No chest pain, chest pressure or palpitation. No melena, diarrhea or incontinence. No muscle weakness.                    Denies depression. No appetite or weight changes.      Objective:   Physical Exam General: alert, well-developed, and cooperative to examination.  Mouth: pharynx pink and moist. Lungs: normal respiratory effort, no accessory muscle use, normal breath sounds, no crackles, and no wheezes. Heart: normal rate, regular rhythm, no murmur, no gallop, and no rub.  Abdomen: soft, non-tender, normal bowel sounds, no distention, no guarding, no rebound tenderness, no hepatomegaly, and no splenomegaly.  Msk: no joint swelling, no joint warmth, and no redness over joints.  Pulses: 1+ DP/PT pulses bilaterally Extremities: No cyanosis, clubbing, edema Neurologic: alert & oriented X3, cranial nerves II-XII intact, strength normal in all extremities, sensation intact to  light touch, and gait normal.  Skin: turgor normal and no rashes.  Psych: Oriented X3, memory intact for recent and remote, normally interactive, good eye contact, not anxious appearing, and not depressed appearing.         Assessment & Plan:

## 2010-08-17 NOTE — Assessment & Plan Note (Signed)
Pt stated that he filled his medications on 08/01/10 and started to take it as prescribed. Pt reports that he has less incidence of claudications, and he is able to ambulate more. - will continue the current regimen.

## 2010-08-22 ENCOUNTER — Telehealth: Payer: Self-pay | Admitting: Cardiovascular Disease

## 2010-08-22 NOTE — Telephone Encounter (Signed)
Per pt call, pt needs refill for [facility].

## 2010-08-22 NOTE — Telephone Encounter (Signed)
Spoke with wife and let her know Plavix had arrived in office ( pt in assistance program ).  Wife aware it will be at front desk to be picked up.  Order # [id number].

## 2010-09-05 ENCOUNTER — Other Ambulatory Visit: Payer: Self-pay | Admitting: Cardiovascular Disease

## 2010-09-29 ENCOUNTER — Emergency Department (HOSPITAL_COMMUNITY)
Admission: EM | Admit: 2010-09-29 | Discharge: 2010-09-30 | Disposition: A | Discharge status: Home / Self Care | Payer: Medicare Other | Attending: Emergency Medicine | Admitting: Emergency Medicine

## 2010-09-29 DIAGNOSIS — I10 Essential (primary) hypertension: Secondary | ICD-10-CM | POA: Insufficient documentation

## 2010-09-29 DIAGNOSIS — R110 Nausea: Secondary | ICD-10-CM | POA: Insufficient documentation

## 2010-09-29 DIAGNOSIS — E876 Hypokalemia: Secondary | ICD-10-CM | POA: Insufficient documentation

## 2010-09-29 DIAGNOSIS — E7800 Pure hypercholesterolemia, unspecified: Secondary | ICD-10-CM | POA: Insufficient documentation

## 2010-09-29 DIAGNOSIS — R1032 Left lower quadrant pain: Secondary | ICD-10-CM | POA: Insufficient documentation

## 2010-09-29 DIAGNOSIS — N390 Urinary tract infection, site not specified: Secondary | ICD-10-CM | POA: Insufficient documentation

## 2010-09-29 DIAGNOSIS — R109 Unspecified abdominal pain: Secondary | ICD-10-CM | POA: Insufficient documentation

## 2010-09-30 ENCOUNTER — Emergency Department (HOSPITAL_COMMUNITY): Payer: Medicare Other

## 2010-09-30 LAB — DIFFERENTIAL
Basophils Absolute: 0 10*3/uL (ref 0.0–0.1)
Basophils Relative: 0 % (ref 0–1)
Eosinophils Absolute: 0.1 10*3/uL (ref 0.0–0.7)
Neutrophils Relative %: 60 % (ref 43–77)

## 2010-09-30 LAB — CBC
Platelets: 238 10*3/uL (ref 150–400)
RBC: 4.3 MIL/uL (ref 4.22–5.81)
WBC: 10.4 10*3/uL (ref 4.0–10.5)

## 2010-09-30 LAB — COMPREHENSIVE METABOLIC PANEL
ALT: 12 U/L (ref 0–53)
AST: 16 U/L (ref 0–37)
CO2: 34 mEq/L — ABNORMAL HIGH (ref 19–32)
Calcium: 9.2 mg/dL (ref 8.4–10.5)
Chloride: 92 mEq/L — ABNORMAL LOW (ref 96–112)
GFR calc non Af Amer: >60 mL/min (ref >60)
Sodium: 136 mEq/L (ref 135–145)
Total Bilirubin: 0.2 mg/dL — ABNORMAL LOW (ref 0.3–1.2)

## 2010-09-30 LAB — URINE MICROSCOPIC-ADD ON

## 2010-09-30 LAB — URINALYSIS, ROUTINE W REFLEX MICROSCOPIC
Glucose, UA: NEGATIVE mg/dL
Ketones, ur: NEGATIVE mg/dL
pH: 7.5 (ref 5.0–8.0)

## 2010-09-30 MED ORDER — IOHEXOL 300 MG/ML  SOLN
80.0000 mL | Freq: Once | INTRAMUSCULAR | Status: AC | PRN
  Administered 2010-09-30: 80 mL via INTRAVENOUS

## 2010-10-01 LAB — URINE CULTURE: Culture  Setup Time: 201209011126

## 2010-10-03 ENCOUNTER — Other Ambulatory Visit: Payer: Self-pay | Admitting: Internal Medicine

## 2010-10-26 LAB — POCT I-STAT, CHEM 8
Calcium, Ion: 1.19
Chloride: 106
HCT: 43
Hemoglobin: 14.6

## 2010-10-26 LAB — URINALYSIS, ROUTINE W REFLEX MICROSCOPIC
Ketones, ur: NEGATIVE
Nitrite: NEGATIVE
Protein, ur: NEGATIVE
Urobilinogen, UA: 0.2
pH: 6.5

## 2010-10-26 LAB — RPR: RPR Ser Ql: NONREACTIVE

## 2010-10-30 LAB — POCT URINALYSIS DIP (DEVICE)
Glucose, UA: NEGATIVE
Ketones, ur: NEGATIVE
Operator id: 235561
Specific Gravity, Urine: 1.02
Urobilinogen, UA: 0.2

## 2010-10-30 LAB — URINALYSIS, ROUTINE W REFLEX MICROSCOPIC
Bilirubin Urine: NEGATIVE
Glucose, UA: NEGATIVE
Hgb urine dipstick: NEGATIVE
Ketones, ur: NEGATIVE
Protein, ur: NEGATIVE

## 2010-10-30 LAB — GC/CHLAMYDIA PROBE AMP, GENITAL
Chlamydia, DNA Probe: NEGATIVE
GC Probe Amp, Genital: NEGATIVE

## 2010-10-30 LAB — RPR: RPR Ser Ql: NONREACTIVE

## 2010-10-30 LAB — URINE CULTURE: Colony Count: 2000

## 2010-11-03 LAB — URINALYSIS, ROUTINE W REFLEX MICROSCOPIC
Glucose, UA: NEGATIVE mg/dL
Hgb urine dipstick: NEGATIVE
Ketones, ur: NEGATIVE mg/dL
Protein, ur: NEGATIVE mg/dL
Urobilinogen, UA: 0.2 mg/dL (ref 0.0–1.0)

## 2010-11-03 LAB — GC/CHLAMYDIA PROBE AMP, GENITAL
Chlamydia, DNA Probe: NEGATIVE
GC Probe Amp, Genital: NEGATIVE

## 2010-11-07 LAB — GC/CHLAMYDIA PROBE AMP, GENITAL
Chlamydia, DNA Probe: NEGATIVE
GC Probe Amp, Genital: NEGATIVE

## 2010-11-07 LAB — URINALYSIS, ROUTINE W REFLEX MICROSCOPIC
Bilirubin Urine: NEGATIVE
Glucose, UA: NEGATIVE
Ketones, ur: NEGATIVE
Protein, ur: NEGATIVE
pH: 5.5

## 2010-11-07 LAB — URINE MICROSCOPIC-ADD ON

## 2010-11-07 LAB — RPR: RPR Ser Ql: NONREACTIVE

## 2010-12-04 ENCOUNTER — Other Ambulatory Visit: Payer: Self-pay | Admitting: Cardiovascular Disease

## 2010-12-04 MED ORDER — CLOPIDOGREL BISULFATE 75 MG PO TABS: 75.0000 mg | ORAL_TABLET | Freq: Every day | ORAL | Status: DC

## 2010-12-11 ENCOUNTER — Other Ambulatory Visit: Payer: Self-pay | Admitting: Cardiovascular Disease

## 2011-02-02 ENCOUNTER — Other Ambulatory Visit: Payer: Self-pay | Admitting: *Deleted

## 2011-02-02 MED ORDER — AMLODIPINE BESYLATE 10 MG PO TABS: 10.0000 mg | ORAL_TABLET | Freq: Every day | ORAL | Status: DC

## 2011-05-09 ENCOUNTER — Encounter: Payer: Self-pay | Admitting: Internal Medicine

## 2011-05-09 ENCOUNTER — Ambulatory Visit (INDEPENDENT_AMBULATORY_CARE_PROVIDER_SITE_OTHER): Payer: Medicare Other | Admitting: Internal Medicine

## 2011-05-09 VITALS — BP 148/82 | HR 78 | Temp 97.8°F | Ht 71.0 in | Wt 184.2 lb

## 2011-05-09 DIAGNOSIS — M549 Dorsalgia, unspecified: Secondary | ICD-10-CM

## 2011-05-09 DIAGNOSIS — E7800 Pure hypercholesterolemia, unspecified: Secondary | ICD-10-CM

## 2011-05-09 DIAGNOSIS — G8929 Other chronic pain: Secondary | ICD-10-CM | POA: Insufficient documentation

## 2011-05-09 DIAGNOSIS — I739 Peripheral vascular disease, unspecified: Secondary | ICD-10-CM

## 2011-05-09 DIAGNOSIS — E785 Hyperlipidemia, unspecified: Secondary | ICD-10-CM

## 2011-05-09 DIAGNOSIS — I10 Essential (primary) hypertension: Secondary | ICD-10-CM

## 2011-05-09 MED ORDER — HYDROCODONE-ACETAMINOPHEN 5-325 MG PO TABS: 1.0000 | ORAL_TABLET | Freq: Four times a day (QID) | ORAL | Status: AC | PRN

## 2011-05-09 MED ORDER — SIMVASTATIN 20 MG PO TABS: 20.0000 mg | ORAL_TABLET | Freq: Every day | ORAL | Status: DC

## 2011-05-09 MED ORDER — HYDROCHLOROTHIAZIDE 25 MG PO TABS: 25.0000 mg | ORAL_TABLET | Freq: Every day | ORAL | Status: DC

## 2011-05-09 MED ORDER — AMLODIPINE BESYLATE 10 MG PO TABS: 10.0000 mg | ORAL_TABLET | Freq: Every day | ORAL | Status: DC

## 2011-05-09 NOTE — Progress Notes (Signed)
Patient ID: Stephen [name], male   DOB: [birth year], 61 y.o.   MRN: [id number]  Subjective:   Patient ID: Stephen [name] male   DOB: 1950-02-08 61 y.o.   MRN: [id number]  HPI: [name] is a 61 y.o. man with PMH of Claudication followed by Dr. Excell Seltzer, HTN, HLD, and chronic back pain who presents today for follow up visit. He states that he ran out of his pain medication for his chronic lower back pain and would like to have refills.   Patient reports that he has been compliant with his medications and followup with Dr. Excell Seltzer for his claudication.  No other acute issues reported.  No headache, fever, or sore throat. No shortness of breath or dyspnea on exertion. No chest pain, chest pressure or palpitation No nausea, vomiting, or abdominal pain. No melena, diarrhea or incontinence. No muscle weakness.                   Denies depression. No appetite or weight changes.    Past Medical History  Diagnosis Date  . Lumbago   . Latent tuberculosis   . Chest pain   . Intermittent claudication     bilateral  . Hyperlipidemia   . Hypertension   . Foot pain     bilateral  . Shoulder pain   . Arthropathy     shoulder  . Chronic cough   . Alcohol abuse    Current Outpatient Prescriptions  Medication Sig Dispense Refill  . amLODipine (NORVASC) 10 MG tablet Take 1 tablet (10 mg total) by mouth daily.  30 tablet  11  . cilostazol (PLETAL) 100 MG tablet TAKE 1 TABLET BY MOUTH TWICE A DAY  60 tablet  1  . clopidogrel (PLAVIX) 75 MG tablet Take 1 tablet (75 mg total) by mouth daily.  30 tablet  6  . hydrochlorothiazide (HYDRODIURIL) 25 MG tablet Take 1 tablet (25 mg total) by mouth daily.  30 tablet  4  . HYDROcodone-acetaminophen (NORCO) 5-325 MG per tablet Take 1 tablet by mouth every 6 (six) hours as needed for pain.  60 tablet  0  . simvastatin (ZOCOR) 20 MG tablet Take 1 tablet (20 mg total) by mouth at bedtime.  30 tablet  11  . DISCONTD: amLODipine (NORVASC) 10 MG tablet Take 1 tablet (10 mg  total) by mouth daily.  30 tablet  11  . DISCONTD: hydrochlorothiazide 25 MG tablet Take 1 tablet (25 mg total) by mouth daily.  30 tablet  4  . DISCONTD: simvastatin (ZOCOR) 20 MG tablet Take 1 tablet (20 mg total) by mouth at bedtime.  30 tablet  11   Family History  Problem Relation Age of Onset  . Other      He does not know his parents history  . Other      there is no specific hx of coronary arterial disease   History   Social History  . Marital Status: Legally Separated    Spouse Name: N/A    Number of Children: N/A  . Years of Education: N/A   Social History Main Topics  . Smoking status: Former Smoker    Types: Cigarettes    Quit date: 01/29/1990  . Smokeless tobacco: None  . Alcohol Use: No     quit alcohol approximately 20 years ago  . Drug Use: 1 per week    Special: Marijuana     marijuana use about once a week. Former cocaine use. quit in the 1980's  .  Sexually Active: None   Other Topics Concern  . None   Social History Narrative  . None   Review of Systems: See HPI Objective:  Physical Exam: Filed Vitals:   05/09/11 1117  BP: 148/82  Pulse: 78  Temp: 97.8 F (36.6 C)  TempSrc: Oral  Height: 5\' 11"  (1.803 m)  Weight: 184 lb 3.2 oz (83.553 kg)    Physical Exam General: alert, well-developed, and cooperative to examination.  Mouth: pharynx pink and moist. Lungs: normal respiratory effort, no accessory muscle use, normal breath sounds, no crackles, and no wheezes. Heart: normal rate, regular rhythm, no murmur, no gallop, and no rub.  Abdomen: soft, non-tender, normal bowel sounds, no distention, no guarding, no rebound tenderness, no hepatomegaly, and no splenomegaly.  Msk: no joint swelling, no joint warmth, and no redness over joints.  Pulses: 1+ DP/PT pulses bilaterally Extremities: No cyanosis, clubbing, edema Neurologic: alert & oriented X3, cranial nerves II-XII intact, strength normal in all extremities, sensation intact to light touch,  and gait normal.  Skin: turgor normal and no rashes.  Psych: Oriented X3, memory intact for recent and remote, normally interactive, good eye contact, not anxious appearing, and not depressed appearing.  Assessment & Plan:

## 2011-05-09 NOTE — Assessment & Plan Note (Signed)
Stable, he is on Norvasc and HCTZ  - continue the current regimen

## 2011-05-09 NOTE — Patient Instructions (Signed)
1. will see you in month 2. Please take your medications as prescribed 3. Will give your one month hydrocodone

## 2011-05-09 NOTE — Assessment & Plan Note (Addendum)
Stable, on Plavix and Platel. followed by Dr. Excell Seltzer.  - continue the current regimen

## 2011-05-09 NOTE — Assessment & Plan Note (Signed)
Stable, No paraspinal tenderness or sciatic nerve pain.   - Vicodin PRN

## 2011-05-09 NOTE — Assessment & Plan Note (Signed)
On zocor  - will check his lipid panel.

## 2011-05-10 LAB — BASIC METABOLIC PANEL WITH GFR
BUN: 17 mg/dL (ref 6–23)
Chloride: 104 mEq/L (ref 96–112)
GFR, Est Non African American: 80 mL/min
Glucose, Bld: 91 mg/dL (ref 70–99)
Potassium: 3.8 mEq/L (ref 3.5–5.3)
Sodium: 140 mEq/L (ref 135–145)

## 2011-05-10 LAB — LIPID PANEL
Cholesterol: 163 mg/dL (ref 0–200)
HDL: 35 mg/dL — ABNORMAL LOW (ref >39)
LDL Cholesterol: 107 mg/dL — ABNORMAL HIGH (ref 0–99)
Triglycerides: 105 mg/dL (ref <150)

## 2011-05-10 LAB — CBC
HCT: 38.8 % — ABNORMAL LOW (ref 39.0–52.0)
Hemoglobin: 12.7 g/dL — ABNORMAL LOW (ref 13.0–17.0)
RDW: 16.8 % — ABNORMAL HIGH (ref 11.5–15.5)
WBC: 8.6 10*3/uL (ref 4.0–10.5)

## 2011-05-29 ENCOUNTER — Ambulatory Visit (INDEPENDENT_AMBULATORY_CARE_PROVIDER_SITE_OTHER): Payer: Medicare Other | Admitting: Cardiovascular Disease

## 2011-05-29 ENCOUNTER — Encounter: Payer: Self-pay | Admitting: Cardiovascular Disease

## 2011-05-29 VITALS — BP 160/90 | HR 73 | Ht 71.0 in | Wt 178.0 lb

## 2011-05-29 DIAGNOSIS — I739 Peripheral vascular disease, unspecified: Secondary | ICD-10-CM

## 2011-05-29 DIAGNOSIS — I10 Essential (primary) hypertension: Secondary | ICD-10-CM

## 2011-05-29 MED ORDER — LISINOPRIL 10 MG PO TABS: 10.0000 mg | ORAL_TABLET | Freq: Every day | ORAL | Status: DC

## 2011-05-29 NOTE — Patient Instructions (Signed)
Your physician wants you to follow-up in: 1 year with Dr. Excell Seltzer.  You will receive a reminder letter in the mail two months in advance. If you don't receive a letter, please call our office to schedule the follow-up appointment.  Start Lisinopril 10mg  daily for blood pressure.  Return for lab work in 2 weeks.  BMET  Your physician has requested that you have an ankle brachial index (ABI) just prior to your next year appointment. During this test an ultrasound and blood pressure cuff are used to evaluate the arteries that supply the arms and legs with blood. Allow thirty minutes for this exam. There are no restrictions or special instructions.

## 2011-05-29 NOTE — Progress Notes (Signed)
   HPI:  61 year-old gentleman presenting for follow-up of PAD. He has bilateral SFA occlusions and has been managed medically. He is also followed for hyperlipidemia and hypertension.   Overall the patient is doing fairly well. He denies recent chest pain or pressure. He has stable symptoms of bilateral calf pain with ambulation. He denies dyspnea, edema, or PND. No palpitations. He is able to walk for about 5 minutes before stopping.   Outpatient Encounter Prescriptions as of 05/29/2011  Medication Sig Dispense Refill  . amLODipine (NORVASC) 10 MG tablet Take 1 tablet (10 mg total) by mouth daily.  30 tablet  11  . cilostazol (PLETAL) 100 MG tablet TAKE 1 TABLET BY MOUTH TWICE A DAY  60 tablet  1  . clopidogrel (PLAVIX) 75 MG tablet Take 1 tablet (75 mg total) by mouth daily.  30 tablet  6  . hydrochlorothiazide (HYDRODIURIL) 25 MG tablet Take 1 tablet (25 mg total) by mouth daily.  30 tablet  4  . simvastatin (ZOCOR) 20 MG tablet Take 1 tablet (20 mg total) by mouth at bedtime.  30 tablet  11    Allergies  Allergen Reactions  . Aspirin     REACTION: GI UPSET    Past Medical History  Diagnosis Date  . Lumbago   . Latent tuberculosis   . Chest pain   . Intermittent claudication     bilateral  . Hyperlipidemia   . Hypertension   . Foot pain     bilateral  . Shoulder pain   . Arthropathy     shoulder  . Chronic cough   . Alcohol abuse     ROS: Negative except as per HPI  BP 160/90  Pulse 73  Ht 5\' 11"  (1.803 m)  Wt 80.74 kg (178 lb)  BMI 24.83 kg/m2  PHYSICAL EXAM: Pt is alert and oriented, NAD HEENT: normal Neck: JVP - normal, carotids 2+= without bruits Lungs: CTA bilaterally CV: RRR without murmur or gallop Abd: soft, NT, Positive BS, no hepatomegaly Ext: no C/C/E, pedal pulses nonpalpable Skin: warm/dry no rash  EKG:  NSR 73 bpm, moderate voltage criteria for LVH  ASSESSMENT AND PLAN: 1. Peripheral arterial disease with intermittent claudication. The  patient has long total SFA occlusions. He has stable intermittent claudication I have recommended continued medical therapy. He is on combination of Plavix and Pletal. He no longer smokes. He should have followup ABIs when he returns in one year.  2. Hypertension. The patient's blood pressure control is suboptimal. He currently is on amlodipine and hydrochlorothiazide. I've recommended that he add lisinopril 10 mg daily. He will return in 2 weeks for a metabolic panel.  3. Hyperlipidemia. Recent lipid showed cholesterol 163, triglycerides 105, HDL 35, and LDL 107. He is on statin therapy with low-dose Zocor.  Stephen [name] 05/30/2011

## 2011-06-04 ENCOUNTER — Other Ambulatory Visit: Payer: Self-pay | Admitting: Cardiovascular Disease

## 2011-06-11 ENCOUNTER — Other Ambulatory Visit (INDEPENDENT_AMBULATORY_CARE_PROVIDER_SITE_OTHER): Payer: Medicare Other

## 2011-06-11 ENCOUNTER — Other Ambulatory Visit: Payer: Self-pay | Admitting: *Deleted

## 2011-06-11 DIAGNOSIS — E876 Hypokalemia: Secondary | ICD-10-CM

## 2011-06-11 DIAGNOSIS — I10 Essential (primary) hypertension: Secondary | ICD-10-CM

## 2011-06-11 LAB — BASIC METABOLIC PANEL
CO2: 25 mEq/L (ref 19–32)
Calcium: 9.1 mg/dL (ref 8.4–10.5)
Creatinine, Ser: 1.1 mg/dL (ref 0.4–1.5)
GFR: 84.32 mL/min (ref >60.00)
Sodium: 135 mEq/L (ref 135–145)

## 2011-06-12 ENCOUNTER — Other Ambulatory Visit (INDEPENDENT_AMBULATORY_CARE_PROVIDER_SITE_OTHER): Payer: Medicare Other

## 2011-06-12 DIAGNOSIS — E876 Hypokalemia: Secondary | ICD-10-CM

## 2011-06-12 LAB — BASIC METABOLIC PANEL
BUN: 21 mg/dL (ref 6–23)
Chloride: 101 mEq/L (ref 96–112)
GFR: 85.19 mL/min (ref >60.00)
Potassium: 3.6 mEq/L (ref 3.5–5.1)
Sodium: 136 mEq/L (ref 135–145)

## 2011-06-14 ENCOUNTER — Other Ambulatory Visit: Payer: Self-pay | Admitting: *Deleted

## 2011-06-14 DIAGNOSIS — E876 Hypokalemia: Secondary | ICD-10-CM

## 2011-06-14 MED ORDER — POTASSIUM CHLORIDE ER 10 MEQ PO TBCR: 10.0000 meq | EXTENDED_RELEASE_TABLET | Freq: Every day | ORAL | Status: DC

## 2011-06-21 ENCOUNTER — Encounter: Payer: Self-pay | Admitting: Internal Medicine

## 2011-06-21 ENCOUNTER — Ambulatory Visit (INDEPENDENT_AMBULATORY_CARE_PROVIDER_SITE_OTHER): Payer: Medicare Other | Admitting: Internal Medicine

## 2011-06-21 VITALS — BP 138/78 | HR 85 | Temp 97.7°F | Ht 71.0 in | Wt 179.8 lb

## 2011-06-21 DIAGNOSIS — I10 Essential (primary) hypertension: Secondary | ICD-10-CM

## 2011-06-21 DIAGNOSIS — D509 Iron deficiency anemia, unspecified: Secondary | ICD-10-CM | POA: Insufficient documentation

## 2011-06-21 DIAGNOSIS — I739 Peripheral vascular disease, unspecified: Secondary | ICD-10-CM

## 2011-06-21 DIAGNOSIS — D649 Anemia, unspecified: Secondary | ICD-10-CM

## 2011-06-21 NOTE — Assessment & Plan Note (Addendum)
Patient had mild anemia in 2007 with a negative anemia panel study. He was not anemic in 2008 and 2009. These is no records of his [facility] between 2009 and 2012.  He is noted to have mild anemia again. Of note, his colonoscopy was unremarkable except for 2 hyperplastic polyps in 2008.  - will recheck his anemia panel. - patient declined FOBT. - will mail him a kit for him to do it at home and send it back. - will likely consider colonoscopy if anemia panel shows iron deficiency

## 2011-06-21 NOTE — Progress Notes (Signed)
Patient ID: Stephen [name], male   DOB: [birth year], 61 y.o.   MRN: [id number] Patient ID: Termaine [name], male   DOB: [birth year], 61 y.o.   MRN: [id number]  Subjective:   Patient ID: Stephen [name] male   DOB: October 18, 1950 61 y.o.   MRN: [id number]  HPI: [name] is a 69 y.o. man with PMH of Claudication followed by Dr. Excell Seltzer, HTN, HLD, and chronic back pain who presents today for follow up visit.  Patient states that he is doing ok now. Patient reports that he has been compliant with his medications and followup with Dr. Excell Seltzer for his claudication. He last saw his cardiologist Dr. Excell Seltzer on 05/29/11 who added Lisinopril to his regimen for his BP control. He had BMP checked 2 weeks after the initiation of lisinopril and was found to have mild hypokalemia. He was given potassium supplement by Dr. Excell Seltzer. No other acute issues reported.  No headache, fever, or sore throat. No shortness of breath or dyspnea on exertion. No chest pain, chest pressure or palpitation No nausea, vomiting, or abdominal pain. No melena, diarrhea or incontinence. No muscle weakness.                   Denies depression. No appetite or weight changes.   Health maintenance 1. Colonoscopy --Pathology report in 2008>>>Hyperplasic polyps. No adenoma found.  2. Declined Tetanus and Zostavax vaccines.    Past Medical History  Diagnosis Date  . Lumbago   . Latent tuberculosis   . Chest pain   . Intermittent claudication     bilateral  . Hyperlipidemia   . Hypertension   . Foot pain     bilateral  . Shoulder pain   . Arthropathy     shoulder  . Chronic cough   . Alcohol abuse    Current Outpatient Prescriptions  Medication Sig Dispense Refill  . amLODipine (NORVASC) 10 MG tablet Take 1 tablet (10 mg total) by mouth daily.  30 tablet  11  . cilostazol (PLETAL) 100 MG tablet TAKE 1 TABLET BY MOUTH TWICE A DAY  60 tablet  1  . clopidogrel (PLAVIX) 75 MG tablet Take 1 tablet (75 mg total) by mouth daily.  30 tablet  6  .  hydrochlorothiazide (HYDRODIURIL) 25 MG tablet Take 1 tablet (25 mg total) by mouth daily.  30 tablet  4  . lisinopril (PRINIVIL,ZESTRIL) 10 MG tablet Take 1 tablet (10 mg total) by mouth daily.  30 tablet  11  . potassium chloride (K-DUR) 10 MEQ tablet Take 1 tablet (10 mEq total) by mouth daily.  30 tablet  6  . simvastatin (ZOCOR) 20 MG tablet Take 1 tablet (20 mg total) by mouth at bedtime.  30 tablet  11  . DISCONTD: simvastatin (ZOCOR) 20 MG tablet TAKE 1 TABLET AT BEDTIME  30 tablet  11   Family History  Problem Relation Age of Onset  . Other      He does not know his parents history  . Other      there is no specific hx of coronary arterial disease   History   Social History  . Marital Status: Legally Separated    Spouse Name: N/A    Number of Children: N/A  . Years of Education: N/A   Social History Main Topics  . Smoking status: Former Smoker    Types: Cigarettes    Quit date: 01/29/1990  . Smokeless tobacco: None  . Alcohol Use: No     quit alcohol  approximately 20 years ago  . Drug Use: 1 per week    Special: Marijuana     marijuana use about once a week. Former cocaine use. quit in the 1980's  . Sexually Active: None   Other Topics Concern  . None   Social History Narrative  . None   Review of Systems: See HPI Objective:  Physical Exam: Filed Vitals:   06/21/11 1325  BP: 138/78  Pulse: 85  Temp: 97.7 F (36.5 C)  TempSrc: Oral  Height: 5\' 11"  (1.803 m)  Weight: 179 lb 12.8 oz (81.557 kg)  SpO2: 98%    Physical Exam General: alert, well-developed, and cooperative to examination.  Mouth: pharynx pink and moist. Lungs: normal respiratory effort, no accessory muscle use, normal breath sounds, no crackles, and no wheezes. Heart: normal rate, regular rhythm, no murmur, no gallop, and no rub.  Abdomen: soft, non-tender, normal bowel sounds, no distention, no guarding, no rebound tenderness, no hepatomegaly, and no splenomegaly.  Msk: no joint  swelling, no joint warmth, and no redness over joints.  Pulses: 1+ DP/PT pulses bilaterally Extremities: No cyanosis, clubbing, edema Neurologic: alert & oriented X3, cranial nerves II-XII intact, strength normal in all extremities, sensation intact to light touch, and gait normal.  Skin: turgor normal and no rashes.  Psych: Oriented X3, memory intact for recent and remote, normally interactive, good eye contact, not anxious appearing, and not depressed appearing.  Assessment & Plan:

## 2011-06-21 NOTE — Patient Instructions (Signed)
1. Follow up with me in 3 months 2. Will check your BMP and anemia panel

## 2011-06-21 NOTE — Assessment & Plan Note (Signed)
See HPI. BP is well controlled.  Lisinopril was added on his antihypertensive regimen 3 weeks ago. Cr stable.  - continue current regimen. - BMP today

## 2011-06-21 NOTE — Assessment & Plan Note (Signed)
Stable  Continue current regimen  

## 2011-06-22 LAB — BASIC METABOLIC PANEL WITH GFR
CO2: 23 mEq/L (ref 19–32)
Calcium: 10.2 mg/dL (ref 8.4–10.5)
Chloride: 99 mEq/L (ref 96–112)
Glucose, Bld: 72 mg/dL (ref 70–99)
Potassium: 4 mEq/L (ref 3.5–5.3)
Sodium: 135 mEq/L (ref 135–145)

## 2011-06-22 LAB — ANEMIA PANEL
ABS Retic: 47.2 10*3/uL (ref 19.0–186.0)
Folate: 8.8 ng/mL
Iron: 40 ug/dL — ABNORMAL LOW (ref 42–165)
RBC.: 4.72 MIL/uL (ref 4.22–5.81)
Retic Ct Pct: 1 % (ref 0.4–2.3)
TIBC: 348 ug/dL (ref 215–435)
UIBC: 308 ug/dL (ref 125–400)

## 2011-07-24 ENCOUNTER — Other Ambulatory Visit (INDEPENDENT_AMBULATORY_CARE_PROVIDER_SITE_OTHER): Payer: Medicare Other

## 2011-07-24 DIAGNOSIS — E876 Hypokalemia: Secondary | ICD-10-CM

## 2011-07-24 LAB — BASIC METABOLIC PANEL
CO2: 26 mEq/L (ref 19–32)
Chloride: 102 mEq/L (ref 96–112)
Creatinine, Ser: 1.3 mg/dL (ref 0.4–1.5)
Sodium: 137 mEq/L (ref 135–145)

## 2011-07-26 ENCOUNTER — Telehealth: Payer: Self-pay | Admitting: Cardiovascular Disease

## 2011-07-26 NOTE — Telephone Encounter (Signed)
Per Dr Excell Seltzer this pt should take two extra tablets of potassium today and then increase to 20 mEq daily.  Recheck potassium in 2 weeks.  I called and spoke with the pt's wife she thinks that the pt has been out of this medication for a while. She is going to look at the pt's bottles and then call back.  If the pt has been out of this medication then he needs to take 20 mEq today and then go back on 10 mEq daily, recheck BMP in 2 weeks.

## 2011-07-26 NOTE — Telephone Encounter (Signed)
Pt was called yesterday re med changes and wanted wife to call today to get the info, he didn't understand

## 2011-07-30 NOTE — Telephone Encounter (Signed)
Left message to call back about potassium.

## 2011-07-31 NOTE — Telephone Encounter (Signed)
Left message to call back  

## 2011-08-01 NOTE — Telephone Encounter (Signed)
Left message to call back  

## 2011-08-04 ENCOUNTER — Other Ambulatory Visit: Payer: Self-pay | Admitting: Cardiovascular Disease

## 2011-08-16 NOTE — Telephone Encounter (Signed)
Letter mailed to the pt to contact our office. The pt will need to come into the office for a repeat BMP prior to making further adjustments in his medications.

## 2011-08-21 ENCOUNTER — Encounter: Payer: Self-pay | Admitting: Cardiovascular Disease

## 2011-08-21 ENCOUNTER — Telehealth: Payer: Self-pay | Admitting: Cardiovascular Disease

## 2011-08-21 DIAGNOSIS — E876 Hypokalemia: Secondary | ICD-10-CM

## 2011-08-21 NOTE — Telephone Encounter (Signed)
Pt's wife calling re letter re lab work, change of meds, and lab work

## 2011-08-21 NOTE — Telephone Encounter (Signed)
I spoke with the pt's wife and made her aware that he needs to come back into our office tomorrow and have a BMP rechecked to assess his potassium.  She is unsure of the pt's medication and does not know if he is taking potassium.  I made her aware that when the pt comes into the office tomorrow that he needs to bring his medications so that we can confirm what he is taking.  She agrees to bring medications.

## 2011-08-22 ENCOUNTER — Other Ambulatory Visit (INDEPENDENT_AMBULATORY_CARE_PROVIDER_SITE_OTHER): Payer: Medicare Other

## 2011-08-22 DIAGNOSIS — E876 Hypokalemia: Secondary | ICD-10-CM

## 2011-08-22 LAB — BASIC METABOLIC PANEL
BUN: 24 mg/dL — ABNORMAL HIGH (ref 6–23)
Chloride: 100 mEq/L (ref 96–112)
Glucose, Bld: 116 mg/dL — ABNORMAL HIGH (ref 70–99)
Potassium: 3.6 mEq/L (ref 3.5–5.1)

## 2011-09-18 ENCOUNTER — Encounter: Payer: Medicare Other | Admitting: Internal Medicine

## 2011-09-21 ENCOUNTER — Telehealth: Payer: Self-pay | Admitting: Internal Medicine

## 2011-09-21 ENCOUNTER — Encounter: Payer: Self-pay | Admitting: Internal Medicine

## 2011-09-21 ENCOUNTER — Other Ambulatory Visit: Payer: Self-pay | Admitting: Internal Medicine

## 2011-09-21 ENCOUNTER — Ambulatory Visit (INDEPENDENT_AMBULATORY_CARE_PROVIDER_SITE_OTHER): Payer: Medicare Other | Admitting: Internal Medicine

## 2011-09-21 VITALS — BP 84/56 | HR 84 | Temp 97.3°F | Resp 20 | Ht 70.0 in | Wt 174.3 lb

## 2011-09-21 DIAGNOSIS — I739 Peripheral vascular disease, unspecified: Secondary | ICD-10-CM

## 2011-09-21 DIAGNOSIS — R1011 Right upper quadrant pain: Secondary | ICD-10-CM | POA: Insufficient documentation

## 2011-09-21 DIAGNOSIS — D509 Iron deficiency anemia, unspecified: Secondary | ICD-10-CM

## 2011-09-21 DIAGNOSIS — I10 Essential (primary) hypertension: Secondary | ICD-10-CM

## 2011-09-21 DIAGNOSIS — D649 Anemia, unspecified: Secondary | ICD-10-CM

## 2011-09-21 LAB — COMPLETE METABOLIC PANEL WITH GFR
BUN: 20 mg/dL (ref 6–23)
CO2: 26 mEq/L (ref 19–32)
Creat: 1.79 mg/dL — ABNORMAL HIGH (ref 0.50–1.35)
GFR, Est African American: 46 mL/min — ABNORMAL LOW
GFR, Est Non African American: 39 mL/min — ABNORMAL LOW
Glucose, Bld: 93 mg/dL (ref 70–99)
Total Bilirubin: 0.3 mg/dL (ref 0.3–1.2)

## 2011-09-21 LAB — LIPASE: Lipase: 42 U/L (ref 11–59)

## 2011-09-21 LAB — CBC
Hemoglobin: 12.5 g/dL — ABNORMAL LOW (ref 13.0–17.0)
MCHC: 34.7 g/dL (ref 30.0–36.0)
RBC: 4.3 MIL/uL (ref 4.22–5.81)

## 2011-09-21 LAB — LACTIC ACID, PLASMA: LACTIC ACID: 1.2 mmol/L (ref 0.5–2.2)

## 2011-09-21 NOTE — Assessment & Plan Note (Signed)
Patient is poor historian.  He reports a 3 day course of intermittent right upper quadrant abdominal pain with no radiation.  Denies fever chills no bowel movement changes.  Mild RUQ tenderness noted on exam negative Murphy's sign.  -Given his history of alcohol abuse in the past, we'll check LFT and abdominal ultrasound to rule out liver or gallbladder problems -Given his history of smoking and PAD, we'll check lactic acid to rule out ischemic colitis - Will also check lipase. -Patient scheduled to have ultrasound early next week and followup with me in one week.

## 2011-09-21 NOTE — Assessment & Plan Note (Addendum)
Mild anemia with last  HH 12.7/87.8.  Patient denies hematuria or dark bloody stool.  Last colonoscopy pathology report in 2008 showing hyperplastic polyps. no adenoma found  - Will need to rule out any bleeding    Rectal digital exam performed at the clinic today with no stool smear noted.  Will send patient 3 FOBT cards with detailed instruction. Patient will need to have FOBT rechecked during next visit.  - We'll check his UA for any microscopic hematuria especially with his history of smoking. - Will repeat CBC today - Will add iron supplement to his regimen during next office visit when his acute problems of low blood pressure and abdominal pain evaluated. -Will obtain full report of his colonoscopy

## 2011-09-21 NOTE — Assessment & Plan Note (Addendum)
Long history of HTN on Norvasc and HCTZ for years with average SBP 130-140's. Lisinopril 10 mg daily was added to his regimen in April, 2013 by Dr. Copper and his BP was at 138/78 during the office visit in May 2013.   He denies dizziness, lightheadedness or vertigo.  But does c/o unspecific intermittent nausea and RUQ pain. He is noted to have low BP at 80's in the clinic today, which is very low to him.  Orthostatic VS negative for BP and positive for HR. He does not check his BP at home.   Orthostatic VS Lying BP 93/61, HR 78 Sitting BP 91/59, HR 86 Standing BP 91/61, HR 97  - will check CMP - will ask him to temporarily hold HCTZ, KCL,  and Norvasc, continue Lisinopril.Marland Kitchen Pending Lab work. Will call his wife and him if we need to adjust his med.    Addendum  Lab 09/21/11 1144  [facility] 133*  K 4.4  CL 94*  CO2 26  GLUCOSE 93  BUN 20  CREATININE 1.79*  CALCIUM 10.1  MG --  PHOS --   - Cr elevated to 1.79 - will need to stop his Lisinopril and HCTZ now. - will call patient to continue his Norvasc - will repeat his BMP in one week.

## 2011-09-21 NOTE — Assessment & Plan Note (Signed)
Stable.  -Continue taking Plavix and Pletal - Followup with Dr. Excell Seltzer

## 2011-09-21 NOTE — Progress Notes (Signed)
Subjective:   Patient ID: Stephen [name] male   DOB: November 30, 1950 61 y.o.   MRN: [id number]  HPI: [name] is a 58 y.o. man with PMH of Claudication followed by Dr. Excell Seltzer, HTN, HLD, and chronic back pain who presents today for follow up visit.    1. Hypertension History of HTN and Compliant with his medications. He is noted to have BP 84/56 upon arrival to the clinic, which drops to SBP 79 when standing up. Denies dizziness, lightheadedness, vertigo, weakness, numbness or tingling. He is on HCTZ 25, Norvasc 10, Lisinopril 10.   2. RUQ abd pain.     Patient reports right upper quadrant intermittent abdominal pain accompanied with nausea for 3 days. He is poor historian and unable to tell details of his pain. He is not sure whether his abdominal pain is dull or sharp.  Denies fever or chills. Denies radiation pain, heart burn, vomiting, diarrhea, constipation or diarrhea. Denies bloody stools. He is here for evaluation.   3.  Anemia     Patient had mild anemia in 2007 with a negative anemia panel study. He was not anemic in 2008 and 2009. These is no records of his [facility] between 2009 and 2012. He is noted to have mild anemia in 2013,   Of note, his colonoscopy was unremarkable except for 2 hyperplastic polyps in 2008   4. Claudication    Patient states that he is doing ok now. Patient reports that he has been compliant with his medications and followup with Dr. Excell Seltzer for his claudication. He last saw his cardiologist Dr. Excell Seltzer on 05/29/11 who added Lisinopril to his regimen for his BP control.   5.Health maintenance  1. Colonoscopy --Pathology report in 2008>>>Hyperplasic polyps. No adenoma found.  2. Declined Tetanus and Zostavax vaccines.    Past Medical History  Diagnosis Date  . Lumbago   . Latent tuberculosis   . Chest pain   . Intermittent claudication     bilateral  . Hyperlipidemia   . Hypertension   . Foot pain     bilateral  . Shoulder pain   . Arthropathy    shoulder  . Chronic cough   . Alcohol abuse    Current Outpatient Prescriptions  Medication Sig Dispense Refill  . amLODipine (NORVASC) 10 MG tablet Take 1 tablet (10 mg total) by mouth daily.  30 tablet  11  . cilostazol (PLETAL) 100 MG tablet TAKE 1 TABLET BY MOUTH TWICE A DAY  60 tablet  1  . clopidogrel (PLAVIX) 75 MG tablet TAKE 1 TABLET (75 MG TOTAL) BY MOUTH DAILY.  30 tablet  6  . hydrochlorothiazide (HYDRODIURIL) 25 MG tablet Take 1 tablet (25 mg total) by mouth daily.  30 tablet  4  . lisinopril (PRINIVIL,ZESTRIL) 10 MG tablet Take 1 tablet (10 mg total) by mouth daily.  30 tablet  11  . potassium chloride (K-DUR) 10 MEQ tablet Take 1 tablet (10 mEq total) by mouth daily.  30 tablet  6  . simvastatin (ZOCOR) 20 MG tablet Take 1 tablet (20 mg total) by mouth at bedtime.  30 tablet  11   Family History  Problem Relation Age of Onset  . Other      He does not know his parents history  . Other      there is no specific hx of coronary arterial disease   History   Social History  . Marital Status: Legally Separated    Spouse Name: N/A  Number of Children: N/A  . Years of Education: N/A   Social History Main Topics  . Smoking status: Former Smoker    Types: Cigarettes    Quit date: 01/29/1990  . Smokeless tobacco: None  . Alcohol Use: No     quit alcohol approximately 20 years ago  . Drug Use: 1 per week    Special: Marijuana     marijuana use about once a week. Former cocaine use. quit in the 1980's  . Sexually Active: None   Other Topics Concern  . None   Social History Narrative  . None   Review of Systems: See HPI Objective:  Physical Exam: Filed Vitals:   09/21/11 1102  BP: 84/56  Pulse: 84  Temp: 97.3 F (36.3 C)  TempSrc: Oral  Resp: 20  Height: 5\' 10"  (1.778 m)  Weight: 174 lb 4.8 oz (79.062 kg)  SpO2: 100%   General: alert, well-developed, and cooperative to examination.  Head: normocephalic and atraumatic.  Eyes: vision grossly intact,  pupils equal, pupils round, pupils reactive to light, no injection and anicteric.  Mouth: pharynx pink and moist, no erythema, and no exudates.  Neck: supple, full ROM, no thyromegaly, no JVD, and no carotid bruits.  Lungs: normal respiratory effort, no accessory muscle use, normal breath sounds, no crackles, and no wheezes. Heart: normal rate, regular rhythm, no murmur, no gallop, and no rub.  Abdomen: soft, RUQ mild tenderness. Negative Murphy sign.  normal bowel sounds, no distention, no guarding, no rebound tenderness, no hepatomegaly, and no splenomegaly.  Msk: no joint swelling, no joint warmth, and no redness over joints.  Pulses: 1+ DP/PT pulses bilaterally Extremities: No cyanosis, clubbing, edema Neurologic: alert & oriented X3, cranial nerves II-XII intact, strength normal in all extremities, sensation intact to light touch, and gait normal.  Skin: turgor normal and no rashes.  Psych: Oriented X3, memory intact for recent and remote, normally interactive, good eye contact, not anxious appearing, and not depressed appearing.   Assessment & Plan:

## 2011-09-21 NOTE — Telephone Encounter (Signed)
Patient is called and BMP results discussed. - patient is instructed to stop Lisinopril, HCTZ and potassium - continue his Norvasc, and call me back on Monday to report his BP.  - follow up with the clinic in one week for repeat BMP.

## 2011-09-21 NOTE — Patient Instructions (Addendum)
1. Follow up in one week 2. Please call back to the clinic on Monday to report your BPs reading over the weekend. 3. Will arrange for abdominal u/s 4. Please stop your Hydrochlorothiazide, potassium and Amlodipine. 5 continue to take your lisinopril. Pending lab work. Will call discuss any changed of your medications once I receive your lab results

## 2011-09-24 ENCOUNTER — Telehealth: Payer: Self-pay | Admitting: *Deleted

## 2011-09-24 ENCOUNTER — Other Ambulatory Visit: Payer: Self-pay | Admitting: Internal Medicine

## 2011-09-24 DIAGNOSIS — I10 Essential (primary) hypertension: Secondary | ICD-10-CM

## 2011-09-24 NOTE — Telephone Encounter (Signed)
Pt informed

## 2011-09-24 NOTE — Telephone Encounter (Signed)
Pt called to report pt's BP readings as requested by you. 8/24  -  125/70   P -  76 8/25  -  133/63   P  -  70 8/26  -  154/75   P  - 91  Pt # [phone]

## 2011-09-24 NOTE — Telephone Encounter (Signed)
Thank you Please ask him to continue to check his BP daily and make sure to follow up with me this Friday ( appt made) He should have BMP checked upon checked in on Friday.

## 2011-09-25 ENCOUNTER — Ambulatory Visit (HOSPITAL_COMMUNITY)
Admission: RE | Admit: 2011-09-25 | Discharge: 2011-09-25 | Disposition: A | Discharge status: Home / Self Care | Payer: Medicare Other | Source: Ambulatory Visit | Attending: Internal Medicine | Admitting: Internal Medicine

## 2011-09-25 DIAGNOSIS — R1011 Right upper quadrant pain: Secondary | ICD-10-CM

## 2011-09-25 DIAGNOSIS — R109 Unspecified abdominal pain: Secondary | ICD-10-CM | POA: Insufficient documentation

## 2011-09-28 ENCOUNTER — Other Ambulatory Visit: Payer: Self-pay | Admitting: Internal Medicine

## 2011-09-28 ENCOUNTER — Encounter: Payer: Self-pay | Admitting: Internal Medicine

## 2011-09-28 ENCOUNTER — Ambulatory Visit (INDEPENDENT_AMBULATORY_CARE_PROVIDER_SITE_OTHER): Payer: Medicare Other | Admitting: Internal Medicine

## 2011-09-28 VITALS — BP 138/75 | HR 63 | Temp 96.9°F | Ht 70.0 in | Wt 177.0 lb

## 2011-09-28 DIAGNOSIS — I10 Essential (primary) hypertension: Secondary | ICD-10-CM

## 2011-09-28 DIAGNOSIS — D509 Iron deficiency anemia, unspecified: Secondary | ICD-10-CM

## 2011-09-28 DIAGNOSIS — N289 Disorder of kidney and ureter, unspecified: Secondary | ICD-10-CM

## 2011-09-28 DIAGNOSIS — R1011 Right upper quadrant pain: Secondary | ICD-10-CM

## 2011-09-28 LAB — URINALYSIS, ROUTINE W REFLEX MICROSCOPIC
Bilirubin Urine: NEGATIVE
Glucose, UA: NEGATIVE mg/dL
Ketones, ur: NEGATIVE mg/dL
Protein, ur: NEGATIVE mg/dL

## 2011-09-28 LAB — BASIC METABOLIC PANEL WITH GFR
CO2: 26 mEq/L (ref 19–32)
Calcium: 10 mg/dL (ref 8.4–10.5)
Glucose, Bld: 93 mg/dL (ref 70–99)
Sodium: 139 mEq/L (ref 135–145)

## 2011-09-28 LAB — URINALYSIS, MICROSCOPIC ONLY

## 2011-09-28 NOTE — Progress Notes (Signed)
Subjective:   Patient ID: Stephen [name] male   DOB: July 18, 1950 61 y.o.   MRN: [id number]  HPI: [name] is a 61 y.o. man with PMH of Claudication followed by Dr. Excell Seltzer, HTN, HLD, and chronic back pain who presents today for follow up visit.   #. Acute kidney insufficiency Cr elevated from 1.00 to 1.79 from April to August 2013 after he was started on lisinopril.  He was also noted to be hypotensive with systolic blood pressure 80s during last office visit.  His creatinine back to 1.10 today after he is off lisinopril for a week.   #. Hypertension History of HTN and Compliant with his medications. He is noted to have BP 84/56 during last clinic visit one week ago. He has been off HCTZ 25, potassium and Lisinopril 10.  His blood pressure is 138/75 today  #. RUQ abd pain. Negative workup including abdominal ultrasound.  Patient states that his pain has resolved.   #.Health maintenance  1. Colonoscopy --Pathology report in 2008>>>Hyperplasic polyps. No adenoma found.  2. Declined Tetanus and Zostavax vaccines.    Past Medical History  Diagnosis Date  . Lumbago   . Latent tuberculosis   . Chest pain   . Intermittent claudication     bilateral  . Hyperlipidemia   . Hypertension   . Foot pain     bilateral  . Shoulder pain   . Arthropathy     shoulder  . Chronic cough   . Alcohol abuse    Current Outpatient Prescriptions  Medication Sig Dispense Refill  . amLODipine (NORVASC) 10 MG tablet Take 1 tablet (10 mg total) by mouth daily.  30 tablet  11  . cilostazol (PLETAL) 100 MG tablet TAKE 1 TABLET BY MOUTH TWICE A DAY  60 tablet  1  . clopidogrel (PLAVIX) 75 MG tablet TAKE 1 TABLET (75 MG TOTAL) BY MOUTH DAILY.  30 tablet  6  . simvastatin (ZOCOR) 20 MG tablet Take 1 tablet (20 mg total) by mouth at bedtime.  30 tablet  11  . hydrochlorothiazide (HYDRODIURIL) 25 MG tablet Take 1 tablet (25 mg total) by mouth daily.  30 tablet  4  . potassium chloride (K-DUR) 10 MEQ tablet  Take 1 tablet (10 mEq total) by mouth daily.  30 tablet  6   Family History  Problem Relation Age of Onset  . Other      He does not know his parents history  . Other      there is no specific hx of coronary arterial disease   History   Social History  . Marital Status: Legally Separated    Spouse Name: N/A    Number of Children: N/A  . Years of Education: N/A   Social History Main Topics  . Smoking status: Former Smoker    Types: Cigarettes    Quit date: 01/29/1990  . Smokeless tobacco: None  . Alcohol Use: No     quit alcohol approximately 20 years ago  . Drug Use: 1 per week    Special: Marijuana     marijuana use about once a week. Former cocaine use. quit in the 1980's  . Sexually Active: None   Other Topics Concern  . None   Social History Narrative  . None   Review of Systems: See HPI Objective:  Physical Exam: Filed Vitals:   09/28/11 1131  BP: 138/75  Pulse: 63  Temp: 96.9 F (36.1 C)  TempSrc: Oral  Height: 5\' 10"  (1.778  m)  Weight: 177 lb (80.287 kg)  SpO2: 100%   General: alert, well-developed, and cooperative to examination.  Head: normocephalic and atraumatic.  Eyes: vision grossly intact, pupils equal, pupils round, pupils reactive to light, no injection and anicteric.  Mouth: pharynx pink and moist, no erythema, and no exudates.  Neck: supple, full ROM, no thyromegaly, no JVD, and no carotid bruits.  Lungs: normal respiratory effort, no accessory muscle use, normal breath sounds, no crackles, and no wheezes. Heart: normal rate, regular rhythm, no murmur, no gallop, and no rub.  Abdomen: soft, nontender, normal bowel sounds, no distention, no guarding, no rebound tenderness, no hepatomegaly, and no splenomegaly.  Msk: no joint swelling, no joint warmth, and no redness over joints.  Pulses: 1+ DP/PT pulses bilaterally Extremities: No cyanosis, clubbing, edema Neurologic: alert & oriented X3, cranial nerves II-XII intact, strength normal in  all extremities, sensation intact to light touch, and gait normal.  Skin: turgor normal and no rashes.  Psych: Oriented X3, memory intact for recent and remote, normally interactive, good eye contact, not anxious appearing, and not depressed appearing.   Assessment & Plan:

## 2011-09-28 NOTE — Assessment & Plan Note (Signed)
Cr elevated from 1.00 to 1.79 from April to August 2013 after he was started on lisinopril.  his 1.10 after he's been off lisinopril for a week.   - Will remove lisinopril from his medication list

## 2011-09-28 NOTE — Assessment & Plan Note (Signed)
Patient was hypotensive at SBP 80s with HCTZ, amlodipine, and lisinopril during last visit.  Blood pressure is better at 130's/70's since he has been off Lisinopril and HCTZ.   - will remove Lisinopril from med list - will ask him to take amlodipine, potassium and HCTZ -Check blood pressures daily

## 2011-09-28 NOTE — Patient Instructions (Addendum)
1. Follow up in 3 months 2. Stop Lisinopril

## 2011-09-28 NOTE — Assessment & Plan Note (Signed)
Patient is asymptomatic.  States that his abdomen pain has resolved spontaneously.  Negative workup including lipase and abdominal ultrasound.  -Observation

## 2011-09-30 ENCOUNTER — Other Ambulatory Visit: Payer: Self-pay | Admitting: Internal Medicine

## 2011-10-03 ENCOUNTER — Other Ambulatory Visit (INDEPENDENT_AMBULATORY_CARE_PROVIDER_SITE_OTHER): Payer: Medicare Other

## 2011-10-03 DIAGNOSIS — D509 Iron deficiency anemia, unspecified: Secondary | ICD-10-CM

## 2011-10-03 DIAGNOSIS — D649 Anemia, unspecified: Secondary | ICD-10-CM

## 2011-10-03 LAB — POC HEMOCCULT BLD/STL (HOME/3-CARD/SCREEN)
Card #3 Fecal Occult Blood, POC: NEGATIVE
Fecal Occult Blood, POC: NEGATIVE

## 2011-10-03 NOTE — Addendum Note (Signed)
Addended by: Bufford Spikes on: 10/03/2011 02:53 PM   Modules accepted: Orders

## 2011-10-10 NOTE — Assessment & Plan Note (Signed)
FOBT x 3 negative reported in Sep, 2013.

## 2012-01-28 ENCOUNTER — Ambulatory Visit (INDEPENDENT_AMBULATORY_CARE_PROVIDER_SITE_OTHER): Payer: Medicare Other | Admitting: Internal Medicine

## 2012-01-28 ENCOUNTER — Encounter: Payer: Self-pay | Admitting: Internal Medicine

## 2012-01-28 VITALS — BP 129/77 | HR 78 | Temp 96.6°F | Ht 69.5 in | Wt 177.3 lb

## 2012-01-28 DIAGNOSIS — G2581 Restless legs syndrome: Secondary | ICD-10-CM

## 2012-01-28 DIAGNOSIS — M545 Low back pain, unspecified: Secondary | ICD-10-CM

## 2012-01-28 DIAGNOSIS — K219 Gastro-esophageal reflux disease without esophagitis: Secondary | ICD-10-CM

## 2012-01-28 DIAGNOSIS — G8929 Other chronic pain: Secondary | ICD-10-CM

## 2012-01-28 DIAGNOSIS — M549 Dorsalgia, unspecified: Secondary | ICD-10-CM

## 2012-01-28 MED ORDER — HYDROCODONE-ACETAMINOPHEN 5-325 MG PO TABS: 1.0000 | ORAL_TABLET | Freq: Four times a day (QID) | ORAL | Status: DC | PRN

## 2012-01-28 MED ORDER — OMEPRAZOLE 20 MG PO CPDR: 20.0000 mg | DELAYED_RELEASE_CAPSULE | Freq: Every day | ORAL | Status: DC

## 2012-01-28 MED ORDER — CYCLOBENZAPRINE HCL 5 MG PO TABS: 5.0000 mg | ORAL_TABLET | Freq: Three times a day (TID) | ORAL | Status: DC | PRN

## 2012-01-28 NOTE — Patient Instructions (Addendum)
1. followup in 2 weeks for evaluation of your leg shaking once your back pain is better.  2. Will give you short term pain medication hydrocodone/acetominophen 5/325 1 tab every 6 hours as needed 3. Flexeril 5 mg po every 8 hours as needed Back Pain, Adult Low back pain is very common. About 1 in 5 people have back pain.The cause of low back pain is rarely dangerous. The pain often gets better over time.About half of people with a sudden onset of back pain feel better in just 2 weeks. About 8 in 10 people feel better by 6 weeks.  CAUSES Some common causes of back pain include:  Strain of the muscles or ligaments supporting the spine.  Wear and tear (degeneration) of the spinal discs.  Arthritis.  Direct injury to the back. DIAGNOSIS Most of the time, the direct cause of low back pain is not known.However, back pain can be treated effectively even when the exact cause of the pain is unknown.Answering your caregiver's questions about your overall health and symptoms is one of the most accurate ways to make sure the cause of your pain is not dangerous. If your caregiver needs more information, he or she may order lab work or imaging tests (X-rays or MRIs).However, even if imaging tests show changes in your back, this usually does not require surgery. HOME CARE INSTRUCTIONS For many people, back pain returns.Since low back pain is rarely dangerous, it is often a condition that people can learn to [facility] their own.   Remain active. It is stressful on the back to sit or stand in one place. Do not sit, drive, or stand in one place for more than 30 minutes at a time. Take short walks on level surfaces as soon as pain allows.Try to increase the length of time you walk each day.  Do not stay in bed.Resting more than 1 or 2 days can delay your recovery.  Do not avoid exercise or work.Your body is made to move.It is not dangerous to be active, even though your back may hurt.Your back will  likely heal faster if you return to being active before your pain is gone.  Pay attention to your body when you bend and lift. Many people have less discomfortwhen lifting if they bend their knees, keep the load close to their bodies,and avoid twisting. Often, the most comfortable positions are those that put less stress on your recovering back.  Find a comfortable position to sleep. Use a firm mattress and lie on your side with your knees slightly bent. If you lie on your back, put a pillow under your knees.  Only take over-the-counter or prescription medicines as directed by your caregiver. Over-the-counter medicines to reduce pain and inflammation are often the most helpful.Your caregiver may prescribe muscle relaxant drugs.These medicines help dull your pain so you can more quickly return to your normal activities and healthy exercise.  Put ice on the injured area.  Put ice in a plastic bag.  Place a towel between your skin and the bag.  Leave the ice on for 15 to 20 minutes, 3 to 4 times a day for the first 2 to 3 days. After that, ice and heat may be alternated to reduce pain and spasms.  Ask your caregiver about trying back exercises and gentle massage. This may be of some benefit.  Avoid feeling anxious or stressed.Stress increases muscle tension and can worsen back pain.It is important to recognize when you are anxious or stressed and  learn ways to manage it.Exercise is a great option. SEEK MEDICAL CARE IF:  You have pain that is not relieved with rest or medicine.  You have pain that does not improve in 1 week.  You have new symptoms.  You are generally not feeling well. SEEK IMMEDIATE MEDICAL CARE IF:   You have pain that radiates from your back into your legs.  You develop new bowel or bladder control problems.  You have unusual weakness or numbness in your arms or legs.  You develop nausea or vomiting.  You develop abdominal pain.  You feel  faint. Document Released: 01/15/2005 Document Revised: 07/17/2011 Document Reviewed: 06/05/2010 [facility] Patient Information 2013 [place], [place].

## 2012-01-28 NOTE — Assessment & Plan Note (Signed)
The clinical investigations is consistent with musculoskeletal pain.  No Red Flag signs.  - shorts term Hydrocodone and flexeril - follow up in 2 weeks if not well.

## 2012-01-28 NOTE — Assessment & Plan Note (Signed)
Reports black shaking and 4 several months without neurological deficit.  - Will defer further evaluation once his acute back pain is better - follow up in 2 weeks.

## 2012-01-28 NOTE — Progress Notes (Signed)
Subjective:   Patient ID: Stephen [name] male   DOB: 20-Jul-1950 61 y.o.   MRN: [id number]  HPI: [name] is a 61 y.o. man with PMH of Claudication followed by Dr. Excell Seltzer, HTN, HLD, and chronic back pain who presents today for follow up visit.   1. Back pain/Lumbago Patient reports acute back pain for one week. Left lower back. Sharp, 8/10, no radiation pain.  Denies sciatic pain.  Denies bowel or urine incontinence.  Denies weakness, numbness tingling.  Denies injury or trauma.  Walking alleviates the pain.  Standing/Lifting aggravates pain.  2. leg shaking Patient reports leg shaking left more than right while resting for several months.  Denies weakness, tingling or numbness.  Denies difficulty walking.  Denies trauma or injury.   #.Health maintenance  1. Colonoscopy --Pathology report in 2008>>>Hyperplasic polyps. No adenoma found.  2. Declined Tetanus and Zostavax vaccines.    Past Medical History  Diagnosis Date  . Lumbago   . Latent tuberculosis   . Chest pain   . Intermittent claudication     bilateral  . Hyperlipidemia   . Hypertension   . Foot pain     bilateral  . Shoulder pain   . Arthropathy     shoulder  . Chronic cough   . Alcohol abuse    Current Outpatient Prescriptions  Medication Sig Dispense Refill  . amLODipine (NORVASC) 10 MG tablet Take 1 tablet (10 mg total) by mouth daily.  30 tablet  11  . cilostazol (PLETAL) 100 MG tablet TAKE 1 TABLET BY MOUTH TWICE A DAY  60 tablet  1  . clopidogrel (PLAVIX) 75 MG tablet TAKE 1 TABLET (75 MG TOTAL) BY MOUTH DAILY.  30 tablet  6  . cyclobenzaprine (FLEXERIL) 5 MG tablet Take 1 tablet (5 mg total) by mouth every 8 (eight) hours as needed for muscle spasms.  30 tablet  1  . hydrochlorothiazide (HYDRODIURIL) 25 MG tablet TAKE 1 TABLET BY MOUTH EVERY DAY  30 tablet  4  . HYDROcodone-acetaminophen (NORCO/VICODIN) 5-325 MG per tablet Take 1 tablet by mouth every 6 (six) hours as needed for pain.  50 tablet  0  .  omeprazole (PRILOSEC) 20 MG capsule Take 1 capsule (20 mg total) by mouth daily.  30 capsule  11  . potassium chloride (K-DUR) 10 MEQ tablet Take 1 tablet (10 mEq total) by mouth daily.  30 tablet  6  . simvastatin (ZOCOR) 20 MG tablet Take 1 tablet (20 mg total) by mouth at bedtime.  30 tablet  11   Family History  Problem Relation Age of Onset  . Other      He does not know his parents history  . Other      there is no specific hx of coronary arterial disease   History   Social History  . Marital Status: Legally Separated    Spouse Name: N/A    Number of Children: N/A  . Years of Education: N/A   Social History Main Topics  . Smoking status: Former Smoker    Types: Cigarettes    Quit date: 01/29/1990  . Smokeless tobacco: None  . Alcohol Use: No     Comment: quit alcohol approximately 20 years ago  . Drug Use: 1 per week    Special: Marijuana     Comment: marijuana use about once a week. Former cocaine use. quit in the 1980's  . Sexually Active: None   Other Topics Concern  . None   Social  History Narrative  . None   Review of Systems: See HPI Objective:  Physical Exam: Filed Vitals:   01/28/12 0951  BP: 129/77  Pulse: 78  Temp: 96.6 F (35.9 C)  TempSrc: Oral  Height: 5' 9.5" (1.765 m)  Weight: 177 lb 4.8 oz (80.423 kg)  SpO2: 98%   General: alert, well-developed, and cooperative to examination.  Head: normocephalic and atraumatic.  Eyes: vision grossly intact, pupils equal, pupils round, pupils reactive to light, no injection and anicteric.  Mouth: pharynx pink and moist, no erythema, and no exudates.  Neck: supple, full ROM, no thyromegaly, no JVD, and no carotid bruits.  Lungs: normal respiratory effort, no accessory muscle use, normal breath sounds, no crackles, and no wheezes. Heart: normal rate, regular rhythm, no murmur, no gallop, and no rub.  Abdomen: soft, nontender, normal bowel sounds, no distention, no guarding, no rebound tenderness, no  hepatomegaly, and no splenomegaly.  Msk: Bilateral paraspinal tenderness noted.  No bony deformities.  No erythema, swelling or warmth to touch.  Straight leg raising test negative. Pulses: 1+ DP/PT pulses bilaterally Extremities: No cyanosis, clubbing, edema Neurologic: alert & oriented X3, cranial nerves II-XII intact, strength normal in all extremities, sensation intact to light touch, and gait normal.  Skin: turgor normal and no rashes.  Psych: Oriented X3, memory intact for recent and remote, normally interactive, good eye contact, not anxious appearing, and not depressed appearing.   Assessment & Plan:

## 2012-02-01 ENCOUNTER — Other Ambulatory Visit: Payer: Self-pay | Admitting: Internal Medicine

## 2012-02-01 MED ORDER — PANTOPRAZOLE SODIUM 20 MG PO TBEC: 20.0000 mg | DELAYED_RELEASE_TABLET | Freq: Every day | ORAL | Status: DC

## 2012-02-11 ENCOUNTER — Encounter: Payer: Self-pay | Admitting: Internal Medicine

## 2012-02-11 ENCOUNTER — Ambulatory Visit (INDEPENDENT_AMBULATORY_CARE_PROVIDER_SITE_OTHER): Payer: Medicare Other | Admitting: Internal Medicine

## 2012-02-11 VITALS — BP 124/76 | HR 78 | Temp 97.8°F | Ht 69.5 in | Wt 181.4 lb

## 2012-02-11 DIAGNOSIS — E785 Hyperlipidemia, unspecified: Secondary | ICD-10-CM

## 2012-02-11 DIAGNOSIS — I10 Essential (primary) hypertension: Secondary | ICD-10-CM

## 2012-02-11 DIAGNOSIS — M545 Low back pain: Secondary | ICD-10-CM

## 2012-02-11 NOTE — Assessment & Plan Note (Signed)
BP Readings from Last 3 Encounters:  02/11/12 124/76  01/28/12 129/77  09/28/11 138/75    Lab Results  Component Value Date   NA 139 09/28/2011   K 3.5 09/28/2011   CREATININE 1.10 09/28/2011    Assessment:  Blood pressure control: controlled  Progress toward BP goal:  at goal  Comments:   Plan:  Medications:  continue current medications  Educational resources provided: brochure  Self management tools provided: home blood pressure logbook  Other plans: Blood pressure is well controlled, we'll continue current regimen.

## 2012-02-11 NOTE — Patient Instructions (Signed)
1. You have done great job in taking all your medications. I appreciate it very much. Please continue doing that. 2. Please take all medications as prescribed and follow you with your doctor in 3 months. 3. If you have worsening of your symptoms or new symptoms arise, please call the clinic ([phone]), or go to the ER immediately if symptoms are severe.

## 2012-02-11 NOTE — Assessment & Plan Note (Addendum)
Back pain resolved. Patient reports that his back pain is very minimal and tolerable. No further work up and will follow up.

## 2012-02-11 NOTE — Progress Notes (Signed)
Patient ID: Stephen [name], male   DOB: Aug 23, 1950, 62 y.o.   MRN: [id number] Subjective:   Patient ID: Stephen [name] male   DOB: Aug 05, 1950 14 y.o.   MRN: [id number]  CC:  HTN follow up visit. HPI:  [name] is a 33 y.o. man with past medical history as outlined below, who presents for a followup visit.               Patient reports that he is taking all his medications regularly. Today he does not have any complaints, and he feels good. Patient was seen at 01/28/12 because of back pain and leg shaking, which also well controlled. He does not have back pain currently. Patient is taking simvastatin for his hyperlipidemia. He did not noticed any side effects, such as muscle pain.  denies fever, chills, fatigue, headaches,  cough, chest pain, SOB,  abdominal pain,diarrhea, constipation, dysuria, urgency, frequency, hematuria.    Past Medical History  Diagnosis Date  . Lumbago   . Latent tuberculosis   . Chest pain   . Intermittent claudication     bilateral  . Hyperlipidemia   . Hypertension   . Foot pain     bilateral  . Shoulder pain   . Arthropathy     shoulder  . Chronic cough   . Alcohol abuse    Current Outpatient Prescriptions  Medication Sig Dispense Refill  . amLODipine (NORVASC) 10 MG tablet Take 1 tablet (10 mg total) by mouth daily.  30 tablet  11  . cilostazol (PLETAL) 100 MG tablet TAKE 1 TABLET BY MOUTH TWICE A DAY  60 tablet  1  . clopidogrel (PLAVIX) 75 MG tablet TAKE 1 TABLET (75 MG TOTAL) BY MOUTH DAILY.  30 tablet  6  . cyclobenzaprine (FLEXERIL) 5 MG tablet Take 1 tablet (5 mg total) by mouth every 8 (eight) hours as needed for muscle spasms.  30 tablet  1  . hydrochlorothiazide (HYDRODIURIL) 25 MG tablet TAKE 1 TABLET BY MOUTH EVERY DAY  30 tablet  4  . HYDROcodone-acetaminophen (NORCO/VICODIN) 5-325 MG per tablet Take 1 tablet by mouth every 6 (six) hours as needed for pain.  50 tablet  0  . pantoprazole (PROTONIX) 20 MG tablet Take 1 tablet (20 mg total) by  mouth daily.  30 tablet  11  . potassium chloride (K-DUR) 10 MEQ tablet Take 1 tablet (10 mEq total) by mouth daily.  30 tablet  6  . simvastatin (ZOCOR) 20 MG tablet Take 1 tablet (20 mg total) by mouth at bedtime.  30 tablet  11   Family History  Problem Relation Age of Onset  . Other      He does not know his parents history  . Other      there is no specific hx of coronary arterial disease   History   Social History  . Marital Status: Legally Separated    Spouse Name: N/A    Number of Children: N/A  . Years of Education: N/A   Social History Main Topics  . Smoking status: Former Smoker    Types: Cigarettes    Quit date: 01/29/1990  . Smokeless tobacco: None  . Alcohol Use: No     Comment: quit alcohol approximately 20 years ago  . Drug Use: 1 per week    Special: Marijuana     Comment: marijuana use about once a week. Former cocaine use. quit in the 1980's  . Sexually Active: None   Other Topics Concern  .  None   Social History Narrative  . None   Review of Systems: General: no fevers, chills, no changes in body weight, no changes in appetite Skin: no rash HEENT: no blurry vision, hearing changes or sore throat Pulm: no dyspnea, coughing, wheezing CV: no chest pain, palpitations, shortness of breath Abd: no nausea/vomiting, abdominal pain, diarrhea/constipation GU: no dysuria, hematuria, polyuria Ext: no arthralgias, myalgias Neuro: no weakness, numbness, or tingling  Objective:  Physical Exam: Filed Vitals:   02/11/12 1004  BP: 124/76  Pulse: 78  Temp: 97.8 F (36.6 C)  TempSrc: Oral  Height: 5' 9.5" (1.765 m)  Weight: 181 lb 6.4 oz (82.283 kg)  SpO2: 99%   General: Not in acute distress HEENT: PERRL, EOMI, no scleral icterus, No bruit or JVD Cardiac: S1/S2, RRR, No murmurs, gallops or rubs Pulm: Good air movement bilaterally, Clear to auscultation bilaterally, No rales, wheezing, rhonchi or rubs. Abd: Soft,  nondistended, nontender, no rebound  pain, no organomegaly, BS present Ext: No rashes or edema, 2+DP/PT pulse bilaterally Musculoskeletal: No joint deformities, erythema, or stiffness, ROM full and nontender Skin: no rashes. No skin bruise. Neuro: Alert and oriented X3, cranial nerves II-XII grossly intact, muscle strength 5/5 in all extremeties,  sensation to light touch intact. Brachial reflexes 1+ bilaterally. Knee reflexes 2+ bilaterally. Babinski's sign negative. Psych: patient is not psychotic, no suicidal or hemocidal ideation.    Assessment & Plan:

## 2012-02-11 NOTE — Assessment & Plan Note (Signed)
Patient is currently taking simvastatin 20 mg daily. He did not noticed any side effects, such as muscle pain. His AST, ALT and ALP are normal on 09/21/11. His last LDL was 107. We'll continue current regimen and followup.

## 2012-03-10 ENCOUNTER — Other Ambulatory Visit: Payer: Self-pay

## 2012-03-10 MED ORDER — CLOPIDOGREL BISULFATE 75 MG PO TABS: ORAL_TABLET | ORAL | Status: DC

## 2012-03-11 ENCOUNTER — Other Ambulatory Visit: Payer: Self-pay | Admitting: Cardiovascular Disease

## 2012-03-11 ENCOUNTER — Other Ambulatory Visit: Payer: Self-pay | Admitting: Internal Medicine

## 2012-04-29 ENCOUNTER — Encounter: Payer: Self-pay | Admitting: Internal Medicine

## 2012-05-31 ENCOUNTER — Other Ambulatory Visit: Payer: Self-pay | Admitting: Cardiovascular Disease

## 2012-06-02 ENCOUNTER — Other Ambulatory Visit: Payer: Self-pay | Admitting: Internal Medicine

## 2012-06-02 ENCOUNTER — Other Ambulatory Visit: Payer: Self-pay | Admitting: Cardiovascular Disease

## 2012-06-10 ENCOUNTER — Encounter: Payer: Self-pay | Admitting: Cardiovascular Disease

## 2012-06-10 ENCOUNTER — Ambulatory Visit (INDEPENDENT_AMBULATORY_CARE_PROVIDER_SITE_OTHER): Payer: Medicare Other | Admitting: Cardiovascular Disease

## 2012-06-10 VITALS — BP 130/88 | HR 70 | Ht 71.0 in | Wt 177.0 lb

## 2012-06-10 DIAGNOSIS — M79609 Pain in unspecified limb: Secondary | ICD-10-CM

## 2012-06-10 DIAGNOSIS — I739 Peripheral vascular disease, unspecified: Secondary | ICD-10-CM

## 2012-06-10 DIAGNOSIS — M79605 Pain in left leg: Secondary | ICD-10-CM

## 2012-06-10 NOTE — Progress Notes (Signed)
HPI:  62 year old gentleman presenting for followup evaluation. The patient is followed for peripheral arterial disease. He has a lateral SFA occlusions managed medically. He also has hypertension and hyperlipidemia. He was last seen in the internal medicine clinic in January. He complains of a tremor in his arms and legs. He has mild leg pain with walking. He has not significantly limited by this. His pain resolves with rest. He's not appreciated any change. The pain is in his calves and both legs are affected equally. He denies chest pain, shortness of breath, edema, orthopnea, palpitations, or PND.  Outpatient Encounter Prescriptions as of 06/10/2012  Medication Sig Dispense Refill  . amLODipine (NORVASC) 10 MG tablet Take 1 tablet (10 mg total) by mouth daily.  30 tablet  11  . cilostazol (PLETAL) 100 MG tablet TAKE 1 TABLET BY MOUTH TWICE A DAY  60 tablet  1  . clopidogrel (PLAVIX) 75 MG tablet TAKE 1 TABLET (75 MG TOTAL) BY MOUTH DAILY.  30 tablet  6  . hydrochlorothiazide (HYDRODIURIL) 25 MG tablet TAKE 1 TABLET BY MOUTH EVERY DAY  30 tablet  11  . HYDROcodone-acetaminophen (NORCO/VICODIN) 5-325 MG per tablet Take 1 tablet by mouth every 6 (six) hours as needed for pain.  50 tablet  0  . pantoprazole (PROTONIX) 20 MG tablet Take 1 tablet (20 mg total) by mouth daily.  30 tablet  11  . potassium chloride (K-DUR) 10 MEQ tablet Take 1 tablet (10 mEq total) by mouth daily.  30 tablet  6  . simvastatin (ZOCOR) 20 MG tablet TAKE 1 TABLET AT BEDTIME  30 tablet  5  . [DISCONTINUED] clopidogrel (PLAVIX) 75 MG tablet TAKE 1 TABLET (75 MG TOTAL) BY MOUTH DAILY.  30 tablet  4  . [DISCONTINUED] cyclobenzaprine (FLEXERIL) 5 MG tablet Take 1 tablet (5 mg total) by mouth every 8 (eight) hours as needed for muscle spasms.  30 tablet  1  . [DISCONTINUED] hydrochlorothiazide (HYDRODIURIL) 25 MG tablet TAKE 1 TABLET BY MOUTH EVERY DAY  30 tablet  4  . [DISCONTINUED] simvastatin (ZOCOR) 20 MG tablet Take 1  tablet (20 mg total) by mouth at bedtime.  30 tablet  11   No facility-administered encounter medications on file as of 06/10/2012.    Allergies  Allergen Reactions  . Aspirin     REACTION: GI UPSET  . Lisinopril Other (See Comments)    Acute kidney insufficiency in 2013 (Cr elevated from 1.00 to 1.79    Past Medical History  Diagnosis Date  . Lumbago   . Latent tuberculosis   . Chest pain   . Intermittent claudication     bilateral  . Hyperlipidemia   . Hypertension   . Foot pain     bilateral  . Shoulder pain   . Arthropathy     shoulder  . Chronic cough   . Alcohol abuse     ROS: Negative except as per HPI  BP 130/88  Pulse 70  Ht 5\' 11"  (1.803 m)  Wt 80.287 kg (177 lb)  BMI 24.7 kg/m2  SpO2 99%  PHYSICAL EXAM: Pt is alert and oriented, NAD HEENT: normal Neck: JVP - normal, carotids 2+= without bruits Lungs: CTA bilaterally CV: RRR without murmur or gallop Abd: soft, NT, Positive BS, no hepatomegaly Ext: no C/C/E, DP and PT pulses are nonpalpable Skin: warm/dry no rash  EKG:   Normal sinus rhythm 74 beats per minute, minimal voltage criteria for LVH maybe normal..  ASSESSMENT AND PLAN: 1.  Lower extremity peripheral arterial disease with intermittent claudication. Will continue medical therapy with Plavix and Pletal. He's on a statin drug. Recommend check ABIs. Followup in 1 year.  2. Hypertension. Blood pressure is controlled on amlodipine. No changes made today.  3. Hyperlipidemia. The patient is on simvastatin and he is followed at the internal medicine clinic.  [name] 06/10/2012 10:29 AM

## 2012-06-10 NOTE — Patient Instructions (Addendum)
Your physician wants you to follow-up in: 1 YEAR with Dr Excell Seltzer.  You will receive a reminder letter in the mail two months in advance. If you don't receive a letter, please call our office to schedule the follow-up appointment.  Your physician has requested that you have an ankle brachial index (ABI). During this test an ultrasound and blood pressure cuff are used to evaluate the arteries that supply the arms and legs with blood. Allow thirty minutes for this exam. There are no restrictions or special instructions.  Your physician recommends that you continue on your current medications as directed. Please refer to the Current Medication list given to you today.

## 2012-06-16 ENCOUNTER — Encounter (INDEPENDENT_AMBULATORY_CARE_PROVIDER_SITE_OTHER): Payer: Medicare Other

## 2012-06-16 DIAGNOSIS — M79604 Pain in right leg: Secondary | ICD-10-CM

## 2012-06-16 DIAGNOSIS — I739 Peripheral vascular disease, unspecified: Secondary | ICD-10-CM

## 2012-07-30 ENCOUNTER — Ambulatory Visit (INDEPENDENT_AMBULATORY_CARE_PROVIDER_SITE_OTHER): Payer: Medicare Other | Admitting: Internal Medicine

## 2012-07-30 ENCOUNTER — Encounter: Payer: Self-pay | Admitting: Internal Medicine

## 2012-07-30 VITALS — BP 156/86 | HR 60 | Temp 97.1°F | Ht 70.25 in | Wt 176.3 lb

## 2012-07-30 DIAGNOSIS — E785 Hyperlipidemia, unspecified: Secondary | ICD-10-CM

## 2012-07-30 DIAGNOSIS — I10 Essential (primary) hypertension: Secondary | ICD-10-CM

## 2012-07-30 DIAGNOSIS — G8929 Other chronic pain: Secondary | ICD-10-CM

## 2012-07-30 DIAGNOSIS — M549 Dorsalgia, unspecified: Secondary | ICD-10-CM

## 2012-07-30 DIAGNOSIS — D509 Iron deficiency anemia, unspecified: Secondary | ICD-10-CM

## 2012-07-30 LAB — COMPLETE METABOLIC PANEL WITH GFR
BUN: 17 mg/dL (ref 6–23)
CO2: 27 mEq/L (ref 19–32)
Creat: 1.21 mg/dL (ref 0.50–1.35)
GFR, Est African American: 73 mL/min
GFR, Est Non African American: 63 mL/min
Glucose, Bld: 80 mg/dL (ref 70–99)
Total Bilirubin: 0.4 mg/dL (ref 0.3–1.2)

## 2012-07-30 LAB — CBC
HCT: 38.3 % — ABNORMAL LOW (ref 39.0–52.0)
MCH: 28 pg (ref 26.0–34.0)
MCHC: 33.9 g/dL (ref 30.0–36.0)
MCV: 82.4 fL (ref 78.0–100.0)
Platelets: 230 10*3/uL (ref 150–400)
RDW: 16.9 % — ABNORMAL HIGH (ref 11.5–15.5)

## 2012-07-30 LAB — LIPID PANEL
Cholesterol: 171 mg/dL (ref 0–200)
HDL: 33 mg/dL — ABNORMAL LOW (ref >39)
Triglycerides: 103 mg/dL (ref <150)

## 2012-07-30 MED ORDER — HYDROCODONE-ACETAMINOPHEN 5-325 MG PO TABS: 1.0000 | ORAL_TABLET | Freq: Three times a day (TID) | ORAL | Status: DC | PRN

## 2012-07-30 MED ORDER — CILOSTAZOL 100 MG PO TABS: ORAL_TABLET | ORAL | Status: DC

## 2012-07-30 MED ORDER — AMLODIPINE BESYLATE 10 MG PO TABS: 10.0000 mg | ORAL_TABLET | Freq: Every day | ORAL | Status: DC

## 2012-07-30 NOTE — Progress Notes (Signed)
Subjective:   Patient ID: Stephen [name] male   DOB: [birth year] 62 y.o.   MRN: [id number] Chief complaints: chronic back pain HPI: Mr.[name] Dosanjh is a 62 y.o. man with PMH of Claudication followed by Dr. Excell Seltzer, HTN, HLD, and chronic back pain who presents today for follow up visit.   1. Back pain/Lumbago Patient reports acute back pain for one week. Left lower back. Sharp, 8/10, no radiation pain.  Denies sciatic pain.  Denies bowel or urine incontinence.  Denies weakness, numbness tingling.  Denies injury or trauma.  Walking alleviates the pain.  Standing/Lifting aggravates pain. Patient has been taking Hydrocodone with good relief. He is here for medication refill.    #.Health maintenance  1. Colonoscopy --Pathology report in 2008>>>Hyperplasic polyps. No adenoma found.  2. Declined Tetanus and Zostavax vaccines. 3. Routine CMP, FLP, CBC,    Past Medical History  Diagnosis Date  . Lumbago   . Latent tuberculosis   . Chest pain   . Intermittent claudication     bilateral  . Hyperlipidemia   . Hypertension   . Foot pain     bilateral  . Shoulder pain   . Arthropathy     shoulder  . Chronic cough   . Alcohol abuse    Current Outpatient Prescriptions  Medication Sig Dispense Refill  . amLODipine (NORVASC) 10 MG tablet Take 1 tablet (10 mg total) by mouth daily.  30 tablet  11  . cilostazol (PLETAL) 100 MG tablet TAKE 1 TABLET BY MOUTH TWICE A DAY  60 tablet  1  . clopidogrel (PLAVIX) 75 MG tablet TAKE 1 TABLET (75 MG TOTAL) BY MOUTH DAILY.  30 tablet  6  . hydrochlorothiazide (HYDRODIURIL) 25 MG tablet TAKE 1 TABLET BY MOUTH EVERY DAY  30 tablet  11  . HYDROcodone-acetaminophen (NORCO/VICODIN) 5-325 MG per tablet Take 1 tablet by mouth every 6 (six) hours as needed for pain.  50 tablet  0  . pantoprazole (PROTONIX) 20 MG tablet Take 1 tablet (20 mg total) by mouth daily.  30 tablet  11  . potassium chloride (K-DUR) 10 MEQ tablet Take 1 tablet (10 mEq total) by mouth daily.  30  tablet  6  . simvastatin (ZOCOR) 20 MG tablet TAKE 1 TABLET AT BEDTIME  30 tablet  5   No current facility-administered medications for this visit.   Family History  Problem Relation Age of Onset  . Other      He does not know his parents history  . Other      there is no specific hx of coronary arterial disease   History   Social History  . Marital Status: Legally Separated    Spouse Name: N/A    Number of Children: N/A  . Years of Education: N/A   Social History Main Topics  . Smoking status: Former Smoker    Types: Cigarettes    Quit date: 01/29/1990  . Smokeless tobacco: None  . Alcohol Use: No     Comment: quit alcohol approximately 20 years ago  . Drug Use: 1.00 per week    Special: Marijuana     Comment: marijuana use about once a week. Former cocaine use. quit in the 1980's  . Sexually Active: None   Other Topics Concern  . None   Social History Narrative  . None   Review of Systems: See HPI Objective:  Physical Exam: Filed Vitals:   07/30/12 1058  BP: 156/86  Pulse: 60  Temp: 97.1 F (36.2  C)  TempSrc: Oral  Height: 5' 10.25" (1.784 m)  Weight: 176 lb 4.8 oz (79.969 kg)  SpO2: 100%   General: alert, well-developed, and cooperative to examination.  Head: normocephalic and atraumatic.  Eyes: vision grossly intact, pupils equal, pupils round, pupils reactive to light, no injection and anicteric.  Mouth: pharynx pink and moist, no erythema, and no exudates.  Neck: supple, full ROM, no thyromegaly, no JVD, and no carotid bruits.  Lungs: normal respiratory effort, no accessory muscle use, normal breath sounds, no crackles, and no wheezes. Heart: normal rate, regular rhythm, no murmur, no gallop, and no rub.  Abdomen: soft, nontender, normal bowel sounds, no distention, no guarding, no rebound tenderness, no hepatomegaly, and no splenomegaly.  Msk: Bilateral paraspinal tenderness noted.  No bony deformities.  No erythema, swelling or warmth to touch.   Straight leg raising test negative. Pulses: 1+ DP/PT pulses bilaterally Extremities: No cyanosis, clubbing, edema Neurologic: alert & oriented X3, cranial nerves II-XII intact, strength normal in all extremities, sensation intact to light touch, and gait normal.  Skin: turgor normal and no rashes.  Psych: Oriented X3, memory intact for recent and remote, normally interactive, good eye contact, not anxious appearing, and not depressed appearing.   Assessment & Plan:

## 2012-07-30 NOTE — Patient Instructions (Addendum)
1. Please check your blood pressure at local pharmacy and call my nurse with readings in 2 weeks.  2. Will follow up in 6 months 3. Please take your medications as prescribed.

## 2012-08-03 NOTE — Assessment & Plan Note (Addendum)
Patient has a history of chronic back pain. Lumbar MRI done in 2006 showed above findings. No worsening of back pain reported. No neurological deficit noted. Pain is well controled by hydrocodone.   - continue current treatment.

## 2012-08-03 NOTE — Assessment & Plan Note (Addendum)
BP Readings from Last 3 Encounters:  07/30/12 156/86  06/10/12 130/88  02/11/12 124/76    Lab Results  Component Value Date   Blessed Cotham 136 07/30/2012   K 3.5 07/30/2012   CREATININE 1.21 07/30/2012    Assessment: Blood pressure control: mildly elevated Progress toward BP goal:  unchanged Comments:   Plan: Medications:  continue current medications Educational resources provided: brochure;handout;video Self management tools provided:   Other plans:   Patient's BP is 156/86 during the clinic visit today. He states that he did not take his medication this morning. Will ask patient to check his BP at home or local drug store.  Call the clinic in 2 weeks to report his BP readings. Patient agrees with the plan.

## 2012-08-04 NOTE — Progress Notes (Signed)
Case discussed with Dr. Li soon after the resident saw the patient. We reviewed the resident's history and exam and pertinent patient test results. I agree with the assessment, diagnosis, and plan of care documented in the resident's note. 

## 2012-09-08 ENCOUNTER — Inpatient Hospital Stay (HOSPITAL_COMMUNITY): Payer: Medicare Other

## 2012-09-08 ENCOUNTER — Emergency Department (HOSPITAL_COMMUNITY): Payer: Medicare Other

## 2012-09-08 ENCOUNTER — Encounter (HOSPITAL_COMMUNITY): Payer: Self-pay | Admitting: *Deleted

## 2012-09-08 ENCOUNTER — Inpatient Hospital Stay (HOSPITAL_COMMUNITY)
Admission: EM | Admit: 2012-09-08 | Discharge: 2012-09-10 | DRG: 065 | Disposition: A | Discharge status: Home Health | Payer: Medicare Other | Attending: Internal Medicine | Admitting: Internal Medicine

## 2012-09-08 DIAGNOSIS — Z8673 Personal history of transient ischemic attack (TIA), and cerebral infarction without residual deficits: Secondary | ICD-10-CM | POA: Diagnosis present

## 2012-09-08 DIAGNOSIS — I639 Cerebral infarction, unspecified: Secondary | ICD-10-CM

## 2012-09-08 DIAGNOSIS — E785 Hyperlipidemia, unspecified: Secondary | ICD-10-CM | POA: Diagnosis present

## 2012-09-08 DIAGNOSIS — Z87891 Personal history of nicotine dependence: Secondary | ICD-10-CM

## 2012-09-08 DIAGNOSIS — R259 Unspecified abnormal involuntary movements: Secondary | ICD-10-CM | POA: Diagnosis present

## 2012-09-08 DIAGNOSIS — R251 Tremor, unspecified: Secondary | ICD-10-CM

## 2012-09-08 DIAGNOSIS — I6340 Cerebral infarction due to embolism of unspecified cerebral artery: Principal | ICD-10-CM | POA: Diagnosis present

## 2012-09-08 DIAGNOSIS — F1210 Cannabis abuse, uncomplicated: Secondary | ICD-10-CM | POA: Diagnosis present

## 2012-09-08 DIAGNOSIS — D509 Iron deficiency anemia, unspecified: Secondary | ICD-10-CM

## 2012-09-08 DIAGNOSIS — R209 Unspecified disturbances of skin sensation: Secondary | ICD-10-CM | POA: Diagnosis present

## 2012-09-08 DIAGNOSIS — I739 Peripheral vascular disease, unspecified: Secondary | ICD-10-CM | POA: Diagnosis present

## 2012-09-08 DIAGNOSIS — R7611 Nonspecific reaction to tuberculin skin test without active tuberculosis: Secondary | ICD-10-CM | POA: Diagnosis present

## 2012-09-08 DIAGNOSIS — E876 Hypokalemia: Secondary | ICD-10-CM | POA: Diagnosis present

## 2012-09-08 DIAGNOSIS — D321 Benign neoplasm of spinal meninges: Secondary | ICD-10-CM | POA: Diagnosis present

## 2012-09-08 DIAGNOSIS — G8190 Hemiplegia, unspecified affecting unspecified side: Secondary | ICD-10-CM | POA: Diagnosis present

## 2012-09-08 DIAGNOSIS — K219 Gastro-esophageal reflux disease without esophagitis: Secondary | ICD-10-CM | POA: Diagnosis present

## 2012-09-08 DIAGNOSIS — I10 Essential (primary) hypertension: Secondary | ICD-10-CM

## 2012-09-08 DIAGNOSIS — G2581 Restless legs syndrome: Secondary | ICD-10-CM

## 2012-09-08 DIAGNOSIS — N289 Disorder of kidney and ureter, unspecified: Secondary | ICD-10-CM

## 2012-09-08 DIAGNOSIS — I6350 Cerebral infarction due to unspecified occlusion or stenosis of unspecified cerebral artery: Secondary | ICD-10-CM

## 2012-09-08 DIAGNOSIS — D320 Benign neoplasm of cerebral meninges: Secondary | ICD-10-CM | POA: Diagnosis present

## 2012-09-08 LAB — APTT: aPTT: 33 seconds (ref 24–37)

## 2012-09-08 LAB — CBC
Hemoglobin: 14.2 g/dL (ref 13.0–17.0)
MCH: 29.5 pg (ref 26.0–34.0)
MCHC: 35.8 g/dL (ref 30.0–36.0)
Platelets: 269 10*3/uL (ref 150–400)
RDW: 16 % — ABNORMAL HIGH (ref 11.5–15.5)

## 2012-09-08 LAB — COMPREHENSIVE METABOLIC PANEL
AST: 19 U/L (ref 0–37)
Albumin: 4.1 g/dL (ref 3.5–5.2)
Alkaline Phosphatase: 76 U/L (ref 39–117)
Chloride: 98 mEq/L (ref 96–112)
Potassium: 3.3 mEq/L — ABNORMAL LOW (ref 3.5–5.1)
Total Bilirubin: 0.2 mg/dL — ABNORMAL LOW (ref 0.3–1.2)
Total Protein: 8.2 g/dL (ref 6.0–8.3)

## 2012-09-08 LAB — POCT I-STAT, CHEM 8
BUN: 13 mg/dL (ref 6–23)
Calcium, Ion: 1.2 mmol/L (ref 1.13–1.30)
Chloride: 100 mEq/L (ref 96–112)
Glucose, Bld: 91 mg/dL (ref 70–99)
HCT: 44 % (ref 39.0–52.0)
Potassium: 3.4 mEq/L — ABNORMAL LOW (ref 3.5–5.1)

## 2012-09-08 LAB — TROPONIN I: Troponin I: <0.3 ng/mL (ref <0.30)

## 2012-09-08 LAB — DIFFERENTIAL
Basophils Absolute: 0 10*3/uL (ref 0.0–0.1)
Basophils Relative: 0 % (ref 0–1)
Eosinophils Absolute: 0.1 10*3/uL (ref 0.0–0.7)
Neutro Abs: 4.5 10*3/uL (ref 1.7–7.7)
Neutrophils Relative %: 46 % (ref 43–77)

## 2012-09-08 LAB — GLUCOSE, CAPILLARY: Glucose-Capillary: 89 mg/dL (ref 70–99)

## 2012-09-08 LAB — POCT I-STAT TROPONIN I: Troponin i, poc: 0 ng/mL (ref 0.00–0.08)

## 2012-09-08 MED ORDER — HEPARIN SODIUM (PORCINE) 5000 UNIT/ML IJ SOLN
5000.0000 [IU] | Freq: Three times a day (TID) | INTRAMUSCULAR | Status: DC
  Administered 2012-09-09 – 2012-09-10 (×5): 5000 [IU] via SUBCUTANEOUS
  Filled 2012-09-08 (×8): qty 1

## 2012-09-08 MED ORDER — PANTOPRAZOLE SODIUM 20 MG PO TBEC
20.0000 mg | DELAYED_RELEASE_TABLET | Freq: Every day | ORAL | Status: DC
  Administered 2012-09-09 – 2012-09-10 (×2): 20 mg via ORAL
  Filled 2012-09-08 (×2): qty 1

## 2012-09-08 MED ORDER — SENNOSIDES-DOCUSATE SODIUM 8.6-50 MG PO TABS: 1.0000 | ORAL_TABLET | Freq: Every evening | ORAL | Status: DC | PRN

## 2012-09-08 MED ORDER — AMLODIPINE BESYLATE 10 MG PO TABS
10.0000 mg | ORAL_TABLET | Freq: Every day | ORAL | Status: DC
Start: 2012-09-09 — End: 2012-09-10
  Administered 2012-09-09 – 2012-09-10 (×2): 10 mg via ORAL
  Filled 2012-09-08 (×2): qty 1

## 2012-09-08 MED ORDER — HYDROCHLOROTHIAZIDE 25 MG PO TABS
25.0000 mg | ORAL_TABLET | Freq: Every day | ORAL | Status: DC
  Administered 2012-09-09 – 2012-09-10 (×2): 25 mg via ORAL
  Filled 2012-09-08 (×2): qty 1

## 2012-09-08 MED ORDER — GADOBENATE DIMEGLUMINE 529 MG/ML IV SOLN
15.0000 mL | Freq: Once | INTRAVENOUS | Status: AC | PRN
  Administered 2012-09-08: 15 mL via INTRAVENOUS

## 2012-09-08 MED ORDER — SODIUM CHLORIDE 0.9 % IV SOLN
INTRAVENOUS | Status: DC
  Administered 2012-09-09: via INTRAVENOUS

## 2012-09-08 MED ORDER — POTASSIUM CHLORIDE CRYS ER 20 MEQ PO TBCR
40.0000 meq | EXTENDED_RELEASE_TABLET | Freq: Once | ORAL | Status: AC
  Administered 2012-09-08: 40 meq via ORAL
  Filled 2012-09-08: qty 2

## 2012-09-08 MED ORDER — CLOPIDOGREL BISULFATE 75 MG PO TABS
75.0000 mg | ORAL_TABLET | Freq: Every day | ORAL | Status: DC
  Administered 2012-09-09 – 2012-09-10 (×2): 75 mg via ORAL
  Filled 2012-09-08 (×3): qty 1

## 2012-09-08 MED ORDER — SIMVASTATIN 20 MG PO TABS
20.0000 mg | ORAL_TABLET | Freq: Every day | ORAL | Status: DC
  Administered 2012-09-09: 20 mg via ORAL
  Filled 2012-09-08: qty 1

## 2012-09-08 NOTE — ED Notes (Signed)
PT to ED c/o weakness to R side of body since yesterday increasing today along with numbness to R side of face and R hand.  No facial palsy and per wife speech is normal.  Weakness noted to R arm and leg.

## 2012-09-08 NOTE — ED Notes (Signed)
MD at bedside. 

## 2012-09-08 NOTE — Consult Note (Signed)
Admission H&P    Chief Complaint: New-onset right-sided weakness and numbness.  HPI: Stephen [name] is an 62 y.o. male history of hypertension and hyperlipidemia who experienced onset of weakness and numbness involving right upper and lower extremities as well as numbness involving his right face about 6 AM yesterday. He subsequently noticed difficulty with walking and experienced increased weakness involving his right lower extremity. He has experienced no changes in speech. There is no previous history of stroke nor TIA. Patient's been taking Plavix 75 mg per day. CT scan of his head showed no acute intracranial abnormality. Small area of hyperattenuation, likely meningioma, as noted in the right parietal region. NIH stroke score was 5.  LSN: 6 AM on 09/07/2012 tPA Given: No: Beyond time under for treatment consideration MRankin: 1  Past Medical History  Diagnosis Date  . Lumbago   . Latent tuberculosis   . Chest pain   . Intermittent claudication     bilateral  . Hyperlipidemia   . Hypertension   . Foot pain     bilateral  . Shoulder pain   . Arthropathy     shoulder  . Chronic cough   . Alcohol abuse     Past Surgical History  Procedure Laterality Date  . Laminectomy  [id number]    left lumbar   . Appendectomy    . Hammer toe surgery      right 2nd. and 5th    Family History  Problem Relation Age of Onset  . Other      He does not know his parents history  . Other      there is no specific hx of coronary arterial disease   Social History:  reports that he quit smoking about 22 years ago. His smoking use included Cigarettes. He smoked 0.00 packs per day. He does not have any smokeless tobacco history on file. He reports that he uses illicit drugs (Marijuana) about once per week. He reports that he does not drink alcohol.  Allergies:  Allergies  Allergen Reactions  . Aspirin     REACTION: GI UPSET  . Lisinopril Other (See Comments)    Acute kidney insufficiency in  2013 (Cr elevated from 1.00 to 1.79    Medications: Have reviewed Mr. patient's medications prior to admission.  ROS: History obtained from the patient  General ROS: negative for - chills, fatigue, fever, night sweats, weight gain or weight loss Psychological ROS: negative for - behavioral disorder, hallucinations, memory difficulties, mood swings or suicidal ideation Ophthalmic ROS: negative for - blurry vision, double vision, eye pain or loss of vision ENT ROS: negative for - epistaxis, nasal discharge, oral lesions, sore throat, tinnitus or vertigo Allergy and Immunology ROS: negative for - hives or itchy/watery eyes Hematological and Lymphatic ROS: negative for - bleeding problems, bruising or swollen lymph nodes Endocrine ROS: negative for - galactorrhea, hair pattern changes, polydipsia/polyuria or temperature intolerance Respiratory ROS: negative for - cough, hemoptysis, shortness of breath or wheezing Cardiovascular ROS: negative for - chest pain, dyspnea on exertion, edema or irregular heartbeat Gastrointestinal ROS: negative for - abdominal pain, diarrhea, hematemesis, nausea/vomiting or stool incontinence Genito-Urinary ROS: negative for - dysuria, hematuria, incontinence or urinary frequency/urgency Musculoskeletal ROS: negative for - joint swelling or muscular weakness Neurological ROS: as noted in HPI Dermatological ROS: negative for rash and skin lesion changes  Physical Examination: Blood pressure 141/81, pulse 67, temperature 99.2 F (37.3 C), temperature source Oral, resp. rate 12, SpO2 100.00%.  Neurologic Examination: Mental Status:  Alert, oriented, thought content appropriate.  Speech fluent without evidence of aphasia. Able to follow commands without difficulty. Cranial Nerves: II-Visual fields were normal. III/IV/VI-Pupils were equal and reacted. Extraocular movements were full and conjugate.    V/VII-right facial numbness to tactile stimulation; no facial  weakness. VIII-normal. X-normal speech and symmetrical palatal movement. Motor: Minimal proximal right upper extremity weakness and moderate distal weakness; moderately severe right lower extremity weakness proximally and mild weakness distally; normal strength of left extremities. Patient had a resting tremor of his left lower extremity distally, and to a lesser extent right upper extremity. Muscle tone was slightly increased throughout. Sensory: Reduced sensation to tactile stimulation over right extremities compared to left extremities. Deep Tendon Reflexes: 1+ and symmetric in upper extremities and 2+ and symmetric in lower extremities. Plantars: Mute bilaterally Cerebellar:  finger-to-nose testing moderately impaired involving right upper extremity and slightly impaired extremity. Carotid auscultation: Normal  Results for orders placed during the hospital encounter of 09/08/12 (from the past 48 hour(s))  PROTIME-INR     Status: None   Collection Time    09/08/12  2:08 PM      Result Value Range   Prothrombin Time 13.3  11.6 - 15.2 seconds   INR 1.03  0.00 - 1.49  APTT     Status: None   Collection Time    09/08/12  2:08 PM      Result Value Range   aPTT 33  24 - 37 seconds  CBC     Status: Abnormal   Collection Time    09/08/12  2:08 PM      Result Value Range   WBC 9.9  4.0 - 10.5 K/uL   RBC 4.81  4.22 - 5.81 MIL/uL   Hemoglobin 14.2  13.0 - 17.0 g/dL   HCT 11.9  14.7 - 82.9 %   MCV 82.5  78.0 - 100.0 fL   MCH 29.5  26.0 - 34.0 pg   MCHC 35.8  30.0 - 36.0 g/dL   RDW 56.2 (*) 13.0 - 86.5 %   Platelets 269  150 - 400 K/uL  DIFFERENTIAL     Status: Abnormal   Collection Time    09/08/12  2:08 PM      Result Value Range   Neutrophils Relative % 46  43 - 77 %   Neutro Abs 4.5  1.7 - 7.7 K/uL   Lymphocytes Relative 44  12 - 46 %   Lymphs Abs 4.2 (*) 0.7 - 4.0 K/uL   Monocytes Relative 9  3 - 12 %   Monocytes Absolute 0.9  0.1 - 1.0 K/uL   Eosinophils Relative 1  0 - 5 %    Eosinophils Absolute 0.1  0.0 - 0.7 K/uL   Basophils Relative 0  0 - 1 %   Basophils Absolute 0.0  0.0 - 0.1 K/uL  COMPREHENSIVE METABOLIC PANEL     Status: Abnormal   Collection Time    09/08/12  2:08 PM      Result Value Range   Sodium 136  135 - 145 mEq/L   Potassium 3.3 (*) 3.5 - 5.1 mEq/L   Chloride 98  96 - 112 mEq/L   CO2 28  19 - 32 mEq/L   Glucose, Bld 90  70 - 99 mg/dL   BUN 14  6 - 23 mg/dL   Creatinine, Ser 7.84  0.50 - 1.35 mg/dL   Calcium 69.6  8.4 - 29.5 mg/dL   Total Protein 8.2  6.0 -  8.3 g/dL   Albumin 4.1  3.5 - 5.2 g/dL   AST 19  0 - 37 U/L   ALT 14  0 - 53 U/L   Alkaline Phosphatase 76  39 - 117 U/L   Total Bilirubin 0.2 (*) 0.3 - 1.2 mg/dL   GFR calc non Af Amer 71 (*) >90 mL/min   GFR calc Af Amer 82 (*) >90 mL/min   Comment:            The eGFR has been calculated     using the CKD EPI equation.     This calculation has not been     validated in all clinical     situations.     eGFR's persistently     <90 mL/min signify     possible Chronic Kidney Disease.     Performed at [facility]  TROPONIN I     Status: None   Collection Time    09/08/12  2:08 PM      Result Value Range   Troponin I <0.30  <0.30 ng/mL   Comment:            Due to the release kinetics of cTnI,     a negative result within the first hours     of the onset of symptoms does not rule out     myocardial infarction with certainty.     If myocardial infarction is still suspected,     repeat the test at appropriate intervals.     Performed at [facility]  GLUCOSE, CAPILLARY     Status: None   Collection Time    09/08/12  2:14 PM      Result Value Range   Glucose-Capillary 89  70 - 99 mg/dL  POCT I-STAT TROPONIN I     Status: None   Collection Time    09/08/12  2:29 PM      Result Value Range   Troponin i, poc 0.00  0.00 - 0.08 ng/mL   Comment 3            Comment: Due to the release kinetics of cTnI,     a negative result within the  first hours     of the onset of symptoms does not rule out     myocardial infarction with certainty.     If myocardial infarction is still suspected,     repeat the test at appropriate intervals.  POCT I-STAT, CHEM 8     Status: Abnormal   Collection Time    09/08/12  2:31 PM      Result Value Range   Sodium 139  135 - 145 mEq/L   Potassium 3.4 (*) 3.5 - 5.1 mEq/L   Chloride 100  96 - 112 mEq/L   BUN 13  6 - 23 mg/dL   Creatinine, Ser 1.61  0.50 - 1.35 mg/dL   Glucose, Bld 91  70 - 99 mg/dL   Calcium, Ion 0.96  0.45 - 1.30 mmol/L   TCO2 26  0 - 100 mmol/L   Hemoglobin 15.0  13.0 - 17.0 g/dL   HCT 40.9  81.1 - 91.4 %   Ct Head (brain) Wo Contrast  09/08/2012   *RADIOLOGY REPORT*  Clinical Data: Right sided weakness evaluate for stroke  CT HEAD WITHOUT CONTRAST  Technique:  Contiguous axial images were obtained from the base of the skull through the vertex without contrast.  Comparison: None.  Findings: There is  diffuse age related atrophy.  There is moderately severe low attenuation in the deep periventricular white matter bilaterally most consistent with chronic small vessel ischemic change.  There is no vascular territory infarct.  There is mild asymmetry in the deep white matter low attenuation, slightly greater in the region of the posterior limb of the left internal capsule when compared to the contralateral side.  Above the level of the ventricles, there is a rounded focus of hyperattenuation adjacent to the inner table of the skull measuring 12 mm in the right anterior parietal lobe.  This demonstrates attenuation value of 82.  There is no hydrocephalus or evidence of intracranial mass otherwise.  No extra-axial fluid.  IMPRESSION:  1.  Chronic involutional change with slight asymmetry, slightly worse on the left.  No vascular territory infarct appreciated. Cannot exclude lacunar infarct on the left. 2.  12 mm rounded focus of hyperattenuation on the right.  A lightly calcified meningioma  is most likely.  Would recommend MRI to confirm this and to exclude intra-axial mass or hemorrhage.   Original Report Authenticated By: Esperanza Heir, M.D.    Assessment: 62 y.o. 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 Patient also has clinical findings indicative of Parkinson's disease mild severity with tremor primarily.  Stroke Risk Factors - hyperlipidemia and hypertension  Plan: 1. HgbA1c, fasting lipid panel 2. MRI, MRA  of the brain without contrast 3. PT consult, OT consult, Speech consult 4. Echocardiogram 5. Carotid dopplers 6. Prophylactic therapy-Antiplatelet med: Plavix  7. Risk factor modification 8. Defer treatment of Parkinson's disease for now unless gait impairment is in part related to Parkinson's disorder.   C.R. Roseanne Reno, MD [facility] [phone]   09/08/2012, 7:42 PM

## 2012-09-08 NOTE — ED Provider Notes (Signed)
CSN: [id number]     Arrival date & time 09/08/12  1353 History     First MD Initiated Contact with Patient 09/08/12 1448     Chief Complaint  Patient presents with  . Numbness  . Extremity Weakness    HPI   Patient awakened this morning feeling numbness in the right side of face. Also noticed his right arm and right leg were weak. He got up to walk around he felt like "I was gonna fall out".  This persisted all day. He slept last night. He awakened today with similar symptoms he is not improving. He presents here.  No headache. No vision changes. No difficult with speech per her and his wife's report. No palpitations no chest pain. Has history of claudication and hypertension. He is a nonsmoker for over 20 years. No previous CVA or TIA symptoms.  Patient follows at the internal medicine clinic with Dr. Dierdre Searles. Past Medical History  Diagnosis Date  . Lumbago   . Latent tuberculosis   . Chest pain   . Intermittent claudication     bilateral  . Hyperlipidemia   . Hypertension   . Foot pain     bilateral  . Shoulder pain   . Arthropathy     shoulder  . Chronic cough   . Alcohol abuse    Past Surgical History  Procedure Laterality Date  . Laminectomy  [id number]    left lumbar   . Appendectomy    . Hammer toe surgery      right 2nd. and 5th   Family History  Problem Relation Age of Onset  . Other      He does not know his parents history  . Other      there is no specific hx of coronary arterial disease   History  Substance Use Topics  . Smoking status: Former Smoker    Types: Cigarettes    Quit date: 01/29/1990  . Smokeless tobacco: Not on file  . Alcohol Use: No     Comment: quit alcohol approximately 20 years ago    Review of Systems  Constitutional: Negative for fever, chills, diaphoresis, appetite change and fatigue.  HENT: Negative for sore throat, mouth sores and trouble swallowing.   Eyes: Negative for visual disturbance.  Respiratory: Negative for cough,  chest tightness, shortness of breath and wheezing.   Cardiovascular: Negative for chest pain.  Gastrointestinal: Negative for nausea, vomiting, abdominal pain, diarrhea and abdominal distention.  Endocrine: Negative for polydipsia, polyphagia and polyuria.  Genitourinary: Negative for dysuria, frequency and hematuria.  Musculoskeletal: Negative for gait problem.  Skin: Negative for color change, pallor and rash.  Neurological: Positive for dizziness, weakness and numbness. Negative for syncope, light-headedness and headaches.  Hematological: Does not bruise/bleed easily.  Psychiatric/Behavioral: Negative for behavioral problems and confusion.    Allergies  Aspirin and Lisinopril  Home Medications   Current Outpatient Rx  Name  Route  Sig  Dispense  Refill  . amLODipine (NORVASC) 10 MG tablet   Oral   Take 1 tablet (10 mg total) by mouth daily.   30 tablet   11   . cilostazol (PLETAL) 100 MG tablet      TAKE 1 TABLET BY MOUTH TWICE A DAY   60 tablet   3   . clopidogrel (PLAVIX) 75 MG tablet      TAKE 1 TABLET (75 MG TOTAL) BY MOUTH DAILY.   30 tablet   6   . hydrochlorothiazide (HYDRODIURIL) 25 MG  tablet      TAKE 1 TABLET BY MOUTH EVERY DAY   30 tablet   11   . pantoprazole (PROTONIX) 20 MG tablet   Oral   Take 1 tablet (20 mg total) by mouth daily.   30 tablet   11   . potassium chloride (K-DUR,KLOR-CON) 10 MEQ tablet   Oral   Take 10 mEq by mouth daily.         . simvastatin (ZOCOR) 20 MG tablet      TAKE 1 TABLET AT BEDTIME   30 tablet   5    BP 139/85  Pulse 89  Temp(Src) 98.3 F (36.8 C) (Oral)  Resp 22  SpO2 98% Physical Exam  Constitutional: He is oriented to person, place, and time. He appears well-developed and well-nourished. No distress.  HENT:  Head: Normocephalic.  Eyes: Conjunctivae are normal. Pupils are equal, round, and reactive to light. No scleral icterus.  Neck: Normal range of motion. Neck supple. No thyromegaly present.   Cardiovascular: Normal rate and regular rhythm.  Exam reveals no gallop and no friction rub.   No murmur heard. Pulmonary/Chest: Effort normal and breath sounds normal. No respiratory distress. He has no wheezes. He has no rales.  Abdominal: Soft. Bowel sounds are normal. He exhibits no distension. There is no tenderness. There is no rebound.  Musculoskeletal: Normal range of motion.  Neurological: He is alert and oriented to person, place, and time.  Neurological exam cranial nerves are intact with the exception of decreased sensation to the right face. His vision is normal to confront his extra ocular movements are intact he has 4/5 strength right upper and right lower extremity. His left upper left lower extremity are  5 over 5.  Skin: Skin is warm and dry. No rash noted.  Psychiatric: He has a normal mood and affect. His behavior is normal.    ED Course   Procedures (including critical care time)  Labs Reviewed  CBC - Abnormal; Notable for the following:    RDW 16.0 (*)    All other components within normal limits  DIFFERENTIAL - Abnormal; Notable for the following:    Lymphs Abs 4.2 (*)    All other components within normal limits  POCT I-STAT, CHEM 8 - Abnormal; Notable for the following:    Potassium 3.4 (*)    All other components within normal limits  PROTIME-INR  APTT  GLUCOSE, CAPILLARY  COMPREHENSIVE METABOLIC PANEL  TROPONIN I  POCT I-STAT TROPONIN I   No results found. No diagnosis found.  MDM  Clinical decision-making. He shows signs of left hemispheric left middle cerebral stroke. He is over 36 hours from the onset of the symptoms. CT is ordered and pending EKG EKG is ordered and pending  CT scan shows no acute infarct. There is an area of calcification versus hemorrhage versus mass in the right temporoparietal brain disease physical. Looks most like just a meningioma resected pseudotumor localized hemorrhage. I discussed the case with the internal medicine  resident will be admitted to the care of residency and the attending Dr. Ilsa Iha MRI will be obtained. Patient was discussed with neurology and emergency room and will be seen by neurology formally as an inpatient.  TP was given as the patient is well out of the window and has an abnormal CT scan  Claudean Kinds, MD 09/08/12 [phone]

## 2012-09-08 NOTE — H&P (Signed)
Date: 09/08/2012               Patient Name:  Stephen [name] MRN: [id number]  DOB: [birth year] Age / Sex: 62 y.o., male   PCP: Dede Query, MD         Medical Service: Internal Medicine Teaching Service         Attending Physician: Dr. Judyann Munson, MD    First Contact: Dr. Lauris Chroman Pager: [phone]  Second Contact: Dr. Lorretta Harp Pager: [phone]       After Hours (After 5p/  First Contact Pager: [phone]  weekends / holidays): Second Contact Pager: [phone]   Chief Complaint: Right sided weakness and numbness  History of Present Illness:  Stephen [name] is a pleasant 62yo male PMH HTN, HLD, PVD, GERD who presents to the hospital with acute right sided weakness and numbness.  The patient first noticed the symptoms yesterday (09/07/12) at about 6am, after awaking from sleep. Specifically he felt a numb, "tingling" sensation in his right face, arm, and leg. He subsequently noted difficulty walking; he felt like he "could not plant his foot" correctly and this made him feel off balance. He had to grab hold of the walls to keep from falling. He also experienced some weakness of the right upper extremity. He was reluctant to come to the hospital today, but his wife insisted when his symptoms did not improve after >36 hours.   Denies speech changes, slurring of words, word-finding problems. Denies double vision or blurry vision. Denies facial dropping. Denies a sensation of the room spinning. No new hand clumsiness, but states he has had a tremor in his right hand x months that makes holding a fork harder. Denies headache, neck pain. Denies chest pain, palpitations, SOB. He has no prior history of stroke or TIA. He has been taking Plavix 75 mg per day for PVD. He has been abstinent from alcohol and tobacco x30 years. Endorses occasional recreational marijuana use, last use ~1 week ago.  In the ED, CT scan of his head showed no acute intracranial abnormality. A small area of hyperattenuation, likely  meningioma, was noted in the right parietal region. Dr. Roseanne Reno (neurology) saw him in the ED. NIH stroke score was 5 (minor-moderate).   Meds: No current facility-administered medications for this encounter.    Allergies: Allergies as of 09/08/2012 - Review Complete 09/08/2012  Allergen Reaction Noted  . Aspirin    . Lisinopril Other (See Comments) 09/28/2011   Past Medical History  Diagnosis Date  . Lumbago   . Latent tuberculosis   . Chest pain   . Intermittent claudication     bilateral  . Hyperlipidemia   . Hypertension   . Foot pain     bilateral  . Shoulder pain   . Arthropathy     shoulder  . Chronic cough   . Alcohol abuse    Past Surgical History  Procedure Laterality Date  . Laminectomy  [id number]    left lumbar   . Appendectomy    . Hammer toe surgery      right 2nd. and 5th   Family History  Problem Relation Age of Onset  . Other      He does not know his parents history  . Other      there is no specific hx of coronary arterial disease   History   Social History  . Marital Status: Legally Separated    Spouse Name: N/A    Number of Children: N/A  .  Years of Education: N/A   Occupational History  . Not on file.   Social History Main Topics  . Smoking status: Former Smoker    Types: Cigarettes    Quit date: 01/29/1990  . Smokeless tobacco: Not on file  . Alcohol Use: No     Comment: quit alcohol approximately 20 years ago  . Drug Use: 1.00 per week    Special: Marijuana     Comment: marijuana use about once a week. Former cocaine use. quit in the 1980's  . Sexually Active: Not on file   Other Topics Concern  . Not on file   Social History Narrative  . No narrative on file    Review of Systems: Pertinent items are noted in HPI.  Physical Exam: Blood pressure 121/76, pulse 80, temperature 99.2 F (37.3 C), temperature source Oral, resp. rate 17, SpO2 99.00%. Physical Exam  Constitutional: He is oriented to person, place, and  time and well-developed, well-nourished, and in no distress.  HENT:  Head: Normocephalic and atraumatic.  Eyes: Conjunctivae and EOM are normal. Pupils are equal, round, and reactive to light.  Neck: Normal range of motion. Neck supple.  Cardiovascular: Normal rate, regular rhythm and normal heart sounds.  Exam reveals no gallop and no friction rub.   No murmur heard. No carotid bruits.  Pulmonary/Chest: Effort normal and breath sounds normal.  Abdominal: Soft. There is no tenderness.  Neurological: He is alert and oriented to person, place, and time. He displays weakness (UE: 4+ throughout on right, 5+ throughout on left. LE: 3+ right hip flexors, 4+ right knee extensors/dorsiflexion/plantarflexion, 5+ throughout on left.) and tremor (Resting, pill-rolling tremor of R hand. Improves with action. Resting tremor of L foot.). He displays no atrophy, facial symmetry and normal speech. A cranial nerve deficit (Right facial numbness V1-3; no facial weakness. ) and sensory deficit (Decreased tactile and pinprick sensation on right UE and LE, compared to left.) is present. He exhibits abnormal muscle tone (Muscle tone somewhat increased throughout; did not appreciate cogwheel rigidity in UEs.). GCS score is 15. Right Babinski's sign: Mute. He displays no Babinski's sign on the left side.  Reflex Scores:      Brachioradialis reflexes are 1+ on the right side and 1+ on the left side.      Patellar reflexes are 2+ on the right side and 2+ on the left side. Fluent speech. Follows commands without difficulty. Gait not assessed.  Skin: He is not diaphoretic.     Lab results: Basic Metabolic Panel:  Recent Labs  16/10/96 1408 09/08/12 1431  NA 136 139  K 3.3* 3.4*  CL 98 100  CO2 28  --   GLUCOSE 90 91  BUN 14 13  CREATININE 1.08 1.10  CALCIUM 10.3  --    Liver Function Tests:  Recent Labs  09/08/12 1408  AST 19  ALT 14  ALKPHOS 76  BILITOT 0.2*  PROT 8.2  ALBUMIN 4.1    CBC:  Recent Labs  09/08/12 1408 09/08/12 1431  WBC 9.9  --   NEUTROABS 4.5  --   HGB 14.2 15.0  HCT 39.7 44.0  MCV 82.5  --   PLT 269  --    Cardiac Enzymes:  Recent Labs  09/08/12 1408  TROPONINI <0.30   CBG:  Recent Labs  09/08/12 1414  GLUCAP 89   Hemoglobin A1C: No results found for this basename: HGBA1C,  in the last 72 hours Fasting Lipid Panel: No results found for this  basename: CHOL, HDL, LDLCALC, TRIG, CHOLHDL, LDLDIRECT,  in the last 72 hours Coagulation:  Recent Labs  09/08/12 1408  LABPROT 13.3  INR 1.03    Imaging results:  Ct Head (brain) Wo Contrast  09/08/2012   *RADIOLOGY REPORT*  Clinical Data: Right sided weakness evaluate for stroke  CT HEAD WITHOUT CONTRAST  Technique:  Contiguous axial images were obtained from the base of the skull through the vertex without contrast.  Comparison: None.  Findings: There is diffuse age related atrophy.  There is moderately severe low attenuation in the deep periventricular white matter bilaterally most consistent with chronic small vessel ischemic change.  There is no vascular territory infarct.  There is mild asymmetry in the deep white matter low attenuation, slightly greater in the region of the posterior limb of the left internal capsule when compared to the contralateral side.  Above the level of the ventricles, there is a rounded focus of hyperattenuation adjacent to the inner table of the skull measuring 12 mm in the right anterior parietal lobe.  This demonstrates attenuation value of 82.  There is no hydrocephalus or evidence of intracranial mass otherwise.  No extra-axial fluid.  IMPRESSION:  1.  Chronic involutional change with slight asymmetry, slightly worse on the left.  No vascular territory infarct appreciated. Cannot exclude lacunar infarct on the left. 2.  12 mm rounded focus of hyperattenuation on the right.  A lightly calcified meningioma is most likely.  Would recommend MRI to confirm this and  to exclude intra-axial mass or hemorrhage.   Original Report Authenticated By: Esperanza Heir, M.D.    Other results: EKG: normal EKG, normal sinus rhythm, unchanged from previous tracings.  Assessment & Plan by Problem: Fawaz Borquez is a 62yo male PMH HTN, HLD, PVD, GERD who presents to the hospital with right sided weakness and numbness x 36 hours.  #Right sided weakness and numbness - Symptoms concerning for subcortical left MCA territory ischemic stroke. He is beyond the time frame for treatment with TPA. Risk factors include HTN, HLD, PVD. CT showed no acute intracranial abnormality, but a small area of hyperattentuation in the right parietal region that could represent a meningioma. However this lesion would not explain his right-sided symptoms, and may be incidental. At this time we cannot rule out acute CVA; obtaining MRI/MRA. Neurology has been consulted. - Admit to IMTS, tele - Neurology on board - MRI, MRA of brain to evaluate for ischemic stroke and better characterize right parietal lesion seen on CT - Echocardiogram  - Carotid dopplers - PT, OT, SLP - Continue home Plavix as prophylactic antiplatelet therapy - Checking A1C, fasting lipid panel - Neuro checks every 2 hours x 12 hours, then every 4 hours  #Resting tremor RUE, LLE - Suggestive for Parkinson's disease given present at rest, improves with action, pill-rolling nature. RUE tremor noted in [facility] problem list as early as 08/01/2010. - Per neuro will defer further assessment and treatment until resolution of acute medical problems  #HTN - Well controlled, currently 121/76. - Continue home norvasc, HCTZ  #HLD - Checking fasting lipid panel - Continue home simvastatin  #PVD - Continue Plavix, holding pletal  #GERD - Continue home protonix  #Hypokalemia - K=3.3, 3.4.  - Treated with K-dur po - Continue to replete as necessary   #DVT PPX - subq heparin, SCDs  Dispo: Disposition is deferred at this time,  awaiting improvement of current medical problems. Anticipated discharge in approximately 1-3 day(s).   The patient does have  a current PCP (Dede Query, MD) and does need an [facility] hospital follow-up appointment after discharge.  The patient does not have transportation limitations that hinder transportation to clinic appointments.  Signed: Vivi Barrack, MD 09/08/2012, 10:25 PM

## 2012-09-09 DIAGNOSIS — N289 Disorder of kidney and ureter, unspecified: Secondary | ICD-10-CM

## 2012-09-09 DIAGNOSIS — I517 Cardiomegaly: Secondary | ICD-10-CM

## 2012-09-09 DIAGNOSIS — K219 Gastro-esophageal reflux disease without esophagitis: Secondary | ICD-10-CM

## 2012-09-09 DIAGNOSIS — G2581 Restless legs syndrome: Secondary | ICD-10-CM

## 2012-09-09 DIAGNOSIS — D509 Iron deficiency anemia, unspecified: Secondary | ICD-10-CM

## 2012-09-09 DIAGNOSIS — I739 Peripheral vascular disease, unspecified: Secondary | ICD-10-CM

## 2012-09-09 LAB — BASIC METABOLIC PANEL
BUN: 12 mg/dL (ref 6–23)
Chloride: 100 mEq/L (ref 96–112)
Creatinine, Ser: 0.98 mg/dL (ref 0.50–1.35)
GFR calc Af Amer: >90 mL/min (ref >90)
Glucose, Bld: 87 mg/dL (ref 70–99)
Potassium: 3.8 mEq/L (ref 3.5–5.1)

## 2012-09-09 LAB — LIPID PANEL
Cholesterol: 170 mg/dL (ref 0–200)
Total CHOL/HDL Ratio: 4.9 RATIO
Triglycerides: 103 mg/dL (ref <150)
VLDL: 21 mg/dL (ref 0–40)

## 2012-09-09 MED ORDER — ATORVASTATIN CALCIUM 80 MG PO TABS
80.0000 mg | ORAL_TABLET | Freq: Every day | ORAL | Status: DC
  Filled 2012-09-09: qty 1

## 2012-09-09 NOTE — Progress Notes (Signed)
Subjective: Stephen [name] is a pleasant 62yo male PMH HTN, HLD, PVD, GERD who presented to the hospital on 09/08/12 with acute right sided weakness and numbness. 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  Patient seen this AM. No complaints. He denied any pain or difficulty sleeping overnight. He says he feels better today and thinks his weakness and numbness has improved slightly.   He had a full TIA/CVA workup today, including carotid dopplers, ECHO, results still pending.  Objective: Vital signs in last 24 hours: Filed Vitals:   09/09/12 0500 09/09/12 0700 09/09/12 0913 09/09/12 1440  BP: 130/71 125/74 141/66 140/74  Pulse: 70 71 63 67  Temp: 98.1 F (36.7 C) 97.8 F (36.6 C) 98.4 F (36.9 C) 98.3 F (36.8 C)  TempSrc: Oral Oral Oral Oral  Resp: 20 20 20 20   Height:      Weight:      SpO2: 100% 100% 100% 97%   Weight change:   Intake/Output Summary (Last 24 hours) at 09/09/12 1743 Last data filed at 09/09/12 0319  Gross per 24 hour  Intake      0 ml  Output    300 ml  Net   -300 ml   Physical Exam: General: Alert, cooperative, and in no apparent distress HEENT: Vision grossly intact, oropharynx clear and non-erythematous  Neck: Full range of motion without pain, supple, no lymphadenopathy or carotid bruits Lungs: Clear to ascultation bilaterally, normal work of respiration, no wheezes, rales, ronchi Heart: Regular rate and rhythm, no murmurs, gallops, or rubs Abdomen: Soft, non-tender, non-distended, normal bowel sounds Extremities: No cyanosis, clubbing, or edema Neurologic: Alert & oriented X3, cranial nerves II-XII intact, strength grossly intact (very mild right-sided weakness present on exam), sensation intact to light touch, w/ mild sensory losses on the right UE and LE. Resting tremor present in the right hand and left foot.  Lab Results: Basic Metabolic Panel:  Recent Labs Lab  09/08/12 1408 09/08/12 1431 09/09/12 0455  NA 136 139 137  K 3.3* 3.4* 3.8  CL 98 100 100  CO2 28  --  24  GLUCOSE 90 91 87  BUN 14 13 12   CREATININE 1.08 1.10 0.98  CALCIUM 10.3  --  9.7   Liver Function Tests:  Recent Labs Lab 09/08/12 1408  AST 19  ALT 14  ALKPHOS 76  BILITOT 0.2*  PROT 8.2  ALBUMIN 4.1   No results found for this basename: LIPASE, AMYLASE,  in the last 168 hours No results found for this basename: AMMONIA,  in the last 168 hours CBC:  Recent Labs Lab 09/08/12 1408 09/08/12 1431  WBC 9.9  --   NEUTROABS 4.5  --   HGB 14.2 15.0  HCT 39.7 44.0  MCV 82.5  --   PLT 269  --    Cardiac Enzymes:  Recent Labs Lab 09/08/12 1408  TROPONINI <0.30   BNP: No results found for this basename: PROBNP,  in the last 168 hours D-Dimer: No results found for this basename: DDIMER,  in the last 168 hours CBG:  Recent Labs Lab 09/08/12 1414  GLUCAP 89   Hemoglobin A1C:  Recent Labs Lab 09/09/12 0455  HGBA1C 6.2*   Fasting Lipid Panel:  Recent Labs Lab 09/09/12 0455  CHOL 170  HDL 35*  LDLCALC 114*  TRIG 103  CHOLHDL 4.9   Thyroid Function Tests: No results found for this basename: TSH, T4TOTAL, FREET4, T3FREE, THYROIDAB,  in the last 168 hours Coagulation:  Recent Labs Lab 09/08/12 1408  LABPROT 13.3  INR 1.03   Anemia Panel: No results found for this basename: VITAMINB12, FOLATE, FERRITIN, TIBC, IRON, RETICCTPCT,  in the last 168 hours Urine Drug Screen: Drugs of Abuse     Component Value Date/Time   LABOPIA NEG 06/12/2006 0000   COCAINSCRNUR NEG 06/12/2006 0000   LABBENZ NEG 06/12/2006 0000   AMPHETMU NEG 06/12/2006 0000    Alcohol Level: No results found for this basename: ETH,  in the last 168 hours  Micro Results: No results found for this or any previous visit (from the past 240 hour(s)). Studies/Results: Ct Head (brain) Wo Contrast  09/08/2012   *RADIOLOGY REPORT*  Clinical Data: Right sided weakness evaluate for  stroke  CT HEAD WITHOUT CONTRAST  Technique:  Contiguous axial images were obtained from the base of the skull through the vertex without contrast.  Comparison: None.  Findings: There is diffuse age related atrophy.  There is moderately severe low attenuation in the deep periventricular white matter bilaterally most consistent with chronic small vessel ischemic change.  There is no vascular territory infarct.  There is mild asymmetry in the deep white matter low attenuation, slightly greater in the region of the posterior limb of the left internal capsule when compared to the contralateral side.  Above the level of the ventricles, there is a rounded focus of hyperattenuation adjacent to the inner table of the skull measuring 12 mm in the right anterior parietal lobe.  This demonstrates attenuation value of 82.  There is no hydrocephalus or evidence of intracranial mass otherwise.  No extra-axial fluid.  IMPRESSION:  1.  Chronic involutional change with slight asymmetry, slightly worse on the left.  No vascular territory infarct appreciated. Cannot exclude lacunar infarct on the left. 2.  12 mm rounded focus of hyperattenuation on the right.  A lightly calcified meningioma is most likely.  Would recommend MRI to confirm this and to exclude intra-axial mass or hemorrhage.   Original Report Authenticated By: Esperanza Heir, M.D.   Mr [facility] Wo Contrast  09/08/2012   *RADIOLOGY REPORT*  Clinical Data:  Stroke.  Numbness in extremity, weakness.  MRI HEAD WITHOUT AND WITH CONTRAST MRA HEAD WITHOUT CONTRAST MRA NECK WITHOUT AND WITH CONTRAST  Technique:  Multiplanar, multiecho pulse sequences of the brain and surrounding structures were obtained without and with intravenous contrast.  Angiographic images of the Circle of Willis were obtained using MRA technique without intravenous contrast. Angiographic images of the neck were obtained using MRA technique without and with intravenous contrast.  Carotid stenosis  measurements (when applicable) are obtained utilizing NASCET criteria, using the distal internal carotid diameter as the denominator.  Contrast: 15mL MULTIHANCE GADOBENATE DIMEGLUMINE 529 MG/ML IV SOLN  Comparison:  Head CT 09/08/2012.  MRI HEAD  Findings:  Calvarium and upper cervical spine: Heterogeneous marrow in the upper cervical spine. There is a well-circumscribed 2.6 cm subcutaneous lesion in the right lower cheek, likely dermal inclusion cyst although imaged limitedly. This should readily apparent clinically.  Orbits: Unremarkable.  Sinuses: Clear. Mastoid and middle ears are clear.  Brain: Restricted diffusion within the posterior left putamen the left posterior corona radiata.  No hemorrhagic conversion.  Extensive remote ischemic injuries with lacunar infarctions in the inferior left cerebellum, posterior and paramedian right cerebellum, left caudate head, left thalamus, and right external capsule.  Confluent T2 and FLAIR hyperintense signal abnormality in the periventricular white matter consistent with chronic small vessel  ischemic demyelination.  Cerebral atrophy.  Enhancing 16 mm T2 hypointense mass along the high right posterior frontal convexity, most consistent with meningioma.  IMPRESSION:  1.  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 2.  Extensive remote small vessel ischemic injury to the infra and supratentorial brain.  3.  16 mm right frontal convexity meningioma.  MRA HEAD  Findings: Absent flow within the right V4 segment, related to occlusion of vertebral artery in the neck. No aneurysm. No significant basilar PCA stenosis.  The right PICA is not well seen, but the right AICA fills normally.  No acute abnormality involving the anterior circulation, such as large vessel occlusion or high-grade stenosis.  Mild luminal undulation of the proximal intracranial vessels, most likely atherosclerotic narrowings.  The left A2 has a more  moderate to advanced to stenosis proximally.  Hypoplastic or absent right A1.  IMPRESSION:  1.  Occluded proximal right V4 segment.  The right PICA is not visualized but there is a present right AICA. 2.  Intracranial atherosclerosis, with notable left A2 stenosis.  MRA NECK  Findings: The nondominant right vertebral artery has minimal flow proximally, both on time-of-flight and postcontrast weighted MRA. No significant carotid stenosis or luminal irregularity.  No evidence of dissection involving the carotids.  Critical Value/emergent results were called by telephone at the time of interpretation on 09/08/2012 at 2330 hours to Dr. Claudell Kyle, who verbally acknowledged these results.  IMPRESSION:  1.  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 2.  Widely patent carotid circulation.   Original Report Authenticated By: Tiburcio Pea   Mr Angiogram Neck W Wo Contrast  09/08/2012   *RADIOLOGY REPORT*  Clinical Data:  Stroke.  Numbness in extremity, weakness.  MRI HEAD WITHOUT AND WITH CONTRAST MRA HEAD WITHOUT CONTRAST MRA NECK WITHOUT AND WITH CONTRAST  Technique:  Multiplanar, multiecho pulse sequences of the brain and surrounding structures were obtained without and with intravenous contrast.  Angiographic images of the Circle of Willis were obtained using MRA technique without intravenous contrast. Angiographic images of the neck were obtained using MRA technique without and with intravenous contrast.  Carotid stenosis measurements (when applicable) are obtained utilizing NASCET criteria, using the distal internal carotid diameter as the denominator.  Contrast: 15mL MULTIHANCE GADOBENATE DIMEGLUMINE 529 MG/ML IV SOLN  Comparison:  Head CT 09/08/2012.  MRI HEAD  Findings:  Calvarium and upper cervical spine: Heterogeneous marrow in the upper cervical spine. There is a well-circumscribed 2.6 cm subcutaneous lesion in the right lower cheek, likely dermal inclusion cyst although imaged limitedly. This should  readily apparent clinically.  Orbits: Unremarkable.  Sinuses: Clear. Mastoid and middle ears are clear.  Brain: Restricted diffusion within the posterior left putamen the left posterior corona radiata.  No hemorrhagic conversion.  Extensive remote ischemic injuries with lacunar infarctions in the inferior left cerebellum, posterior and paramedian right cerebellum, left caudate head, left thalamus, and right external capsule.  Confluent T2 and FLAIR hyperintense signal abnormality in the periventricular white matter consistent with chronic small vessel ischemic demyelination.  Cerebral atrophy.  Enhancing 16 mm T2 hypointense mass along the high right posterior frontal convexity, most consistent with meningioma.  IMPRESSION:  1.  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 2.  Extensive remote small vessel ischemic injury to the infra and supratentorial brain.  3.  16 mm right frontal convexity meningioma.  MRA HEAD  Findings: Absent flow within the right  V4 segment, related to occlusion of vertebral artery in the neck. No aneurysm. No significant basilar PCA stenosis.  The right PICA is not well seen, but the right AICA fills normally.  No acute abnormality involving the anterior circulation, such as large vessel occlusion or high-grade stenosis.  Mild luminal undulation of the proximal intracranial vessels, most likely atherosclerotic narrowings.  The left A2 has a more moderate to advanced to stenosis proximally.  Hypoplastic or absent right A1.  IMPRESSION:  1.  Occluded proximal right V4 segment.  The right PICA is not visualized but there is a present right AICA. 2.  Intracranial atherosclerosis, with notable left A2 stenosis.  MRA NECK  Findings: The nondominant right vertebral artery has minimal flow proximally, both on time-of-flight and postcontrast weighted MRA. No significant carotid stenosis or luminal irregularity.  No evidence of dissection  involving the carotids.  Critical Value/emergent results were called by telephone at the time of interpretation on 09/08/2012 at 2330 hours to Dr. Claudell Kyle, who verbally acknowledged these results.  IMPRESSION:  1.  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 2.  Widely patent carotid circulation.   Original Report Authenticated By: Tiburcio Pea   Mr Brain W Wo Contrast  09/08/2012   *RADIOLOGY REPORT*  Clinical Data:  Stroke.  Numbness in extremity, weakness.  MRI HEAD WITHOUT AND WITH CONTRAST MRA HEAD WITHOUT CONTRAST MRA NECK WITHOUT AND WITH CONTRAST  Technique:  Multiplanar, multiecho pulse sequences of the brain and surrounding structures were obtained without and with intravenous contrast.  Angiographic images of the Circle of Willis were obtained using MRA technique without intravenous contrast. Angiographic images of the neck were obtained using MRA technique without and with intravenous contrast.  Carotid stenosis measurements (when applicable) are obtained utilizing NASCET criteria, using the distal internal carotid diameter as the denominator.  Contrast: 15mL MULTIHANCE GADOBENATE DIMEGLUMINE 529 MG/ML IV SOLN  Comparison:  Head CT 09/08/2012.  MRI HEAD  Findings:  Calvarium and upper cervical spine: Heterogeneous marrow in the upper cervical spine. There is a well-circumscribed 2.6 cm subcutaneous lesion in the right lower cheek, likely dermal inclusion cyst although imaged limitedly. This should readily apparent clinically.  Orbits: Unremarkable.  Sinuses: Clear. Mastoid and middle ears are clear.  Brain: Restricted diffusion within the posterior left putamen the left posterior corona radiata.  No hemorrhagic conversion.  Extensive remote ischemic injuries with lacunar infarctions in the inferior left cerebellum, posterior and paramedian right cerebellum, left caudate head, left thalamus, and right external capsule.  Confluent T2 and FLAIR hyperintense signal abnormality in the  periventricular white matter consistent with chronic small vessel ischemic demyelination.  Cerebral atrophy.  Enhancing 16 mm T2 hypointense mass along the high right posterior frontal convexity, most consistent with meningioma.  IMPRESSION:  1.  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 2.  Extensive remote small vessel ischemic injury to the infra and supratentorial brain.  3.  16 mm right frontal convexity meningioma.  MRA HEAD  Findings: Absent flow within the right V4 segment, related to occlusion of vertebral artery in the neck. No aneurysm. No significant basilar PCA stenosis.  The right PICA is not well seen, but the right AICA fills normally.  No acute abnormality involving the anterior circulation, such as large vessel occlusion or high-grade stenosis.  Mild luminal undulation of the proximal intracranial vessels, most likely atherosclerotic narrowings.  The left A2 has a more moderate to advanced to stenosis proximally.  Hypoplastic  or absent right A1.  IMPRESSION:  1.  Occluded proximal right V4 segment.  The right PICA is not visualized but there is a present right AICA. 2.  Intracranial atherosclerosis, with notable left A2 stenosis.  MRA NECK  Findings: The nondominant right vertebral artery has minimal flow proximally, both on time-of-flight and postcontrast weighted MRA. No significant carotid stenosis or luminal irregularity.  No evidence of dissection involving the carotids.  Critical Value/emergent results were called by telephone at the time of interpretation on 09/08/2012 at 2330 hours to Dr. Claudell Kyle, who verbally acknowledged these results.  IMPRESSION:  1.  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 2.  Widely patent carotid circulation.   Original Report Authenticated By: Tiburcio Pea   Medications: I have reviewed the patient's current medications. Scheduled Meds: . amLODipine  10 mg Oral Daily  . clopidogrel  75  mg Oral Q breakfast  . heparin  5,000 Units Subcutaneous Q8H  . hydrochlorothiazide  25 mg Oral Daily  . pantoprazole  20 mg Oral Daily  . simvastatin  20 mg Oral q1800   Continuous Infusions: . sodium chloride 125 mL/hr at 09/09/12 0003   PRN Meds:.senna-docusate Assessment/Plan: Stephen [name] is a 61yo male PMH HTN, HLD, PVD, GERD who presents to the hospital with right sided weakness and numbness x 36 hours.   #Right sided weakness and numbness - Symptoms concerning for subcortical left MCA territory ischemic stroke. He is beyond the time frame for treatment with TPA. Risk factors include HTN, HLD, PVD. CT showed no acute intracranial abnormality, but a small area of hyperattentuation in the right parietal region that could represent a meningioma. 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 Patient received carotid dopplers today, as well as an ECHO, results pending. - Neurology on board   - PT saw patient today, suggests home health PT w/ 24 hr supervision/assistance on discharge, as well as a rolling walker. Speech eval said the patient's speech, language, and communicative function are all wnl. No f/u necessary. - Continue home Plavix as prophylactic antiplatelet therapy  - HbA1c 6.2, fasting lipid panel shows HDL of 35 and LDL of 114.  - Neuro checks every 2 hours x 12 hours, then every 4 hours   #Resting tremor RUE, LLE - Suggestive for Parkinson's disease given present at rest, improves with action, pill-rolling nature. RUE tremor noted in [facility] problem list as early as 08/01/2010.  - Per neuro will defer further assessment and treatment until resolution of acute medical problems.  #HTN - Well controlled, currently 121/76.  - Continue home norvasc, HCTZ   #HLD - Checking fasting lipid panel; total cholesterol 170, HDL 35, and LDL 114 - Given his many risk factors and recent CVA, changing to atorvastatin 80 mg  #PVD  - Continue Plavix, holding pletal   #GERD - Continue home protonix   #Hypokalemia - K is now 3.8, wnl. - Continue to replete as necessary   #DVT PPX - subq heparin, SCDs  Dispo: Disposition is deferred at this time, awaiting improvement of current medical problems.  Anticipated discharge in approximately 3-4 day(s).   The patient does have a current PCP (Dede Query, MD) and does need an [facility] hospital follow-up appointment after discharge.  The patient does not have transportation limitations that hinder transportation to clinic appointments.  .Services Needed at time of discharge: Y = Yes, Blank = No PT:   OT:   RN:  Equipment:   Other:     LOS: 1 day   Lars Masson, MD 09/09/2012, 5:43 PM Pager: [phone]

## 2012-09-09 NOTE — Evaluation (Signed)
Physical Therapy Evaluation Patient Details Name: Stephen [name] MRN: [id number] DOB: Jun 26, 1950 Today's Date: 09/09/2012 Time: 4098-1191 PT Time Calculation (min): 33 min  PT Assessment / Plan / Recommendation History of Present Illness  Patient is a 62 y/o male admitted with left sided weakness, positive for acute infarct posterior left putamen and post left corona radiata.  Clinical Impression  Patient presents with right side weakness, decreased coordination, decreased balance and left LE tremor impairing independence with mobility.  He will benefit from skilled PT in the acute setting to maximize independence for return home with initial 24 hour assist and HHPT.    PT Assessment  Patient needs continued PT services    Follow Up Recommendations  Home health PT;Supervision/Assistance - 24 hour    Does the patient have the potential to tolerate intense rehabilitation    N/A  Barriers to Discharge Decreased caregiver support does state wife can stay initially to assist    Equipment Recommendations  Rolling walker with 5" wheels       Frequency Min 4X/week    Precautions / Restrictions Restrictions Weight Bearing Restrictions: No   Pertinent Vitals/Pain Denies pain      Mobility  Bed Mobility Bed Mobility: Supine to Sit Supine to Sit: 5: Supervision Sitting - Scoot to Edge of Bed: 5: Supervision Transfers Transfers: Sit to Stand;Stand to Sit Sit to Stand: 4: Min guard;From bed Stand to Sit: To chair/3-in-1;4: Min guard;With upper extremity assist Details for Transfer Assistance: cues for hand placement, backing up to chair to sit Ambulation/Gait Ambulation/Gait Assistance: 5: Supervision;4: Min guard Ambulation Distance (Feet): 200 Feet Assistive device: Rolling walker Ambulation/Gait Assistance Details: initially min assist in room without device, minguard initially with walker, cues for proximity to walker, and safety on turns, then with supervision last 100' except  with turns; initiall with facilitation for pelvic rotation on right, then pt able to perform independently Gait Pattern: Step-through pattern;Decreased stride length;Trunk rotated posteriorly on right;Decreased stance time - right;Decreased step length - left Stairs: Yes Stairs Assistance: 4: Min assist;4: Min guard Stairs Assistance Details (indicate cue type and reason): cues for sequence due to difficulty getting right foot up on step Stair Management Technique: Two rails;Step to pattern;Forwards Number of Stairs: 3 Modified Rankin (Stroke Patients Only) Pre-Morbid Rankin Score: No significant disability Modified Rankin: Moderately severe disability        PT Diagnosis: Abnormality of gait;Hemiplegia dominant side  PT Problem List: Decreased strength;Decreased mobility;Decreased balance;Decreased safety awareness;Decreased knowledge of use of DME;Impaired sensation;Decreased coordination PT Treatment Interventions: DME instruction;Balance training;Gait training;Neuromuscular re-education;Stair training;Functional mobility training;Patient/family education;Therapeutic activities;Therapeutic exercise     PT Goals(Current goals can be found in the care plan section) Acute Rehab PT Goals Patient Stated Goal: To return home independent PT Goal Formulation: With patient Time For Goal Achievement: 09/23/12 Potential to Achieve Goals: Good  Visit Information  Last PT Received On: 09/09/12 Assistance Needed: +1 History of Present Illness: Patient is a 62 y/o male admitted with left sided weakness, positive for acute infarct posterior left putamen and post left corona radiata.       Prior Functioning  Home Living Family/patient expects to be discharged to:: Private residence Living Arrangements: Alone Type of Home: House Home Access: Stairs to enter Secretary/administrator of Steps: 3 Entrance Stairs-Rails: Can reach both Home Layout: One level Home Equipment: Cane - single  point Additional Comments: reports wife could stay temporarily if needed Prior Function Level of Independence: Independent with assistive device(s) Comments: about 2 falls in past  6 months Communication Communication: No difficulties Dominant Hand: Right    Cognition  Cognition Arousal/Alertness: Awake/alert Behavior During Therapy: WFL for tasks assessed/performed Overall Cognitive Status: Within Functional Limits for tasks assessed    Extremity/Trunk Assessment Upper Extremity Assessment Upper Extremity Assessment: Defer to OT evaluation Lower Extremity Assessment Lower Extremity Assessment: RLE deficits/detail;LLE deficits/detail RLE Deficits / Details: AROM WFL. strength grossly 3+ to 4-/5, coordination decreased toe tapping RLE Sensation: decreased light touch RLE Coordination: decreased gross motor LLE Deficits / Details: tremor left LE, strength 4 to 4+/5   Balance Balance Balance Assessed: Yes Static Standing Balance Static Standing - Balance Support: No upper extremity supported Static Standing - Level of Assistance: 5: Stand by assistance Static Standing - Comment/# of Minutes: 1; stands 10 seconds eyes closed with supervision  End of Session PT - End of Session Equipment Utilized During Treatment: Gait belt Activity Tolerance: Patient tolerated treatment well Patient left: in chair;with call bell/phone within reach Nurse Communication: Mobility status  GP     [facility] 09/09/2012, 12:02 PM  Sheran Lawless, PT [phone] 09/09/2012

## 2012-09-09 NOTE — Progress Notes (Signed)
   CARE MANAGEMENT NOTE 09/09/2012  Patient:  Stephen [name], Stephen [name]   Account Number:  [id number]  Date Initiated:  09/09/2012  Documentation initiated by:  Jiles Crocker  Subjective/Objective Assessment:   ADMITTED WITH New-onset right-sided weakness and numbness.     Action/Plan:   PCP:  Dede Query, MD  LIVES ALONE; CM FOLLOWING FOR DCP   Anticipated DC Date:  09/16/2012   Anticipated DC Plan:  HOME W HOME HEALTH SERVICES      DC Planning Services  CM consult      Status of service:  In process, will continue to follow Medicare Important Message given?  NA - LOS <3 / Initial given by admissions (If response is "NO", the following Medicare IM given date fields will be blank)  Per UR Regulation:  Reviewed for med. necessity/level of care/duration of stay  Comments:  09/09/2012- B Vela Render RN,BSN,MHA

## 2012-09-09 NOTE — Progress Notes (Signed)
Stroke Team Progress Note  HISTORY Stephen [name] is an 63 y.o. male history of hypertension and hyperlipidemia who experienced onset of weakness and numbness involving right upper and lower extremities as well as numbness involving his right face about 6 AM yesterday 09/07/2012. He subsequently noticed difficulty with walking and experienced increased weakness involving his right lower extremity. He has experienced no changes in speech. There is no previous history of stroke nor TIA. Patient's been taking Plavix 75 mg per day. CT scan of his head showed no acute intracranial abnormality. Small area of hyperattenuation, likely meningioma, as noted in the right parietal region. NIH stroke score was 5. Patient was not a TPA candidate secondary to delay in arrival. He was admitted for further evaluation and treatment.  SUBJECTIVE No family is at the bedside.  Overall he feels his condition is gradually improving.   OBJECTIVE Most recent Vital Signs: Filed Vitals:   09/09/12 0100 09/09/12 0300 09/09/12 0500 09/09/12 0700  BP: 144/74 127/70 130/71 125/74  Pulse: 67  70 71  Temp: 97.6 F (36.4 C)  98.1 F (36.7 C) 97.8 F (36.6 C)  TempSrc: Oral  Oral Oral  Resp: 20 20 20 20   Height:      Weight:      SpO2: 100% 99% 100% 100%   CBG (last 3)   Recent Labs  09/08/12 1414  GLUCAP 89    IV Fluid Intake:   . sodium chloride 125 mL/hr at 09/09/12 0003    MEDICATIONS  . amLODipine  10 mg Oral Daily  . clopidogrel  75 mg Oral Q breakfast  . heparin  5,000 Units Subcutaneous Q8H  . hydrochlorothiazide  25 mg Oral Daily  . pantoprazole  20 mg Oral Daily  . simvastatin  20 mg Oral q1800   PRN:  senna-docusate  Diet:  Cardiac thin liquids Activity:  Bedrest, OOB with assistance DVT Prophylaxis:  Heparin 5000 units sq tid   CLINICALLY SIGNIFICANT STUDIES Basic Metabolic Panel:  Recent Labs Lab 09/08/12 1408 09/08/12 1431 09/09/12 0455  NA 136 139 137  K 3.3* 3.4* 3.8  CL 98 100 100   CO2 28  --  24  GLUCOSE 90 91 87  BUN 14 13 12   CREATININE 1.08 1.10 0.98  CALCIUM 10.3  --  9.7   Liver Function Tests:  Recent Labs Lab 09/08/12 1408  AST 19  ALT 14  ALKPHOS 76  BILITOT 0.2*  PROT 8.2  ALBUMIN 4.1   CBC:  Recent Labs Lab 09/08/12 1408 09/08/12 1431  WBC 9.9  --   NEUTROABS 4.5  --   HGB 14.2 15.0  HCT 39.7 44.0  MCV 82.5  --   PLT 269  --    Coagulation:  Recent Labs Lab 09/08/12 1408  LABPROT 13.3  INR 1.03   Cardiac Enzymes:  Recent Labs Lab 09/08/12 1408  TROPONINI <0.30   Urinalysis: No results found for this basename: COLORURINE, APPERANCEUR, LABSPEC, PHURINE, GLUCOSEU, HGBUR, BILIRUBINUR, KETONESUR, PROTEINUR, UROBILINOGEN, NITRITE, LEUKOCYTESUR,  in the last 168 hours Lipid Panel    Component Value Date/Time   CHOL 170 09/09/2012 0455   TRIG 103 09/09/2012 0455   HDL 35* 09/09/2012 0455   CHOLHDL 4.9 09/09/2012 0455   VLDL 21 09/09/2012 0455   LDLCALC 114* 09/09/2012 0455   HgbA1C  No results found for this basename: HGBA1C    Urine Drug Screen:     Component Value Date/Time   LABOPIA NEG 06/12/2006 0000   COCAINSCRNUR NEG  06/12/2006 0000   LABBENZ NEG 06/12/2006 0000   AMPHETMU NEG 06/12/2006 0000    Alcohol Level: No results found for this basename: ETH,  in the last 168 hours  CT of the brain  09/08/2012   1.  Chronic involutional change with slight asymmetry, slightly worse on the left.  No vascular territory infarct appreciated. Cannot exclude lacunar infarct on the left. 2.  12 mm rounded focus of hyperattenuation on the right.  A lightly calcified meningioma is most likely.  Would recommend MRI to confirm this and to exclude intra-axial mass or hemorrhage.    MRI of the brain  09/08/2012    1.  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 2.  Extensive remote small vessel ischemic injury to the infra and supratentorial brain.  3.  16 mm right frontal convexity  meningioma.    MRA of the brain  09/08/2012   1.  Occluded proximal right V4 segment.  The right PICA is not visualized but there is a present right AICA. 2.  Intracranial atherosclerosis, with notable left A2 stenosis.    MRA of the neck 09/08/2012    1.  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 2.  Widely patent carotid circulation.  2D Echocardiogram    Carotid Doppler    CXR    EKG  normal sinus rhythm.   Therapy Recommendations   Physical Exam   Pleasant middle aged male not in distress.Awake alert. Afebrile. Head is nontraumatic. Neck is supple without bruit. Hearing is normal. Cardiac exam no murmur or gallop. Lungs are clear to auscultation. Distal pulses are well felt. Neurological Exam : Awake alert oriented x3 with normal speech and language function. Extraocular movements are full range without nystagmus. Fundi were not visualized. Vision acuity and fields appear normal. No facial weakness. Tongue is midline. Motor system exam reveals no upper or lower extremity drift. Mild diminished fine finger movements on the right. Orbits left-to-right approximate the. No sensory loss. Coordination is intact. Gait was not tested. ASSESSMENT [name] is a 62 y.o. male presenting with right face numbness and RUE & RLE weakness and numbness. 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 Infarct felt to be thrombotic secondary to small vessel disease.  On clopidogrel 75 mg orally every day prior to admission. Now on clopidogrel 75 mg orally every day for secondary stroke prevention. Patient with resultant right hemisensory loss, mild right hemiparesis. Work up underway.  Hypertension Hyperlipidemia, LDL 110, on zocor 20 PTA, now on zocor 20, goal LDL < 100 Marijuana use 1x wk  Extensive intracranial small vessel ischemic  16 mm right frontal convexity meningioma.    Lumbago  latenent TB  Hx etoh use and smoking - last in [facility] day #  1  TREATMENT/PLAN  Continue clopidogrel 75 mg orally every day for secondary stroke prevention.  F/u carotid doppler, 2 D echo, HgbA1c  SHARON BIBY, MSN, RN, ANVP-BC, ANP-BC, GNP-BC [facility] Pager: [phone] 09/09/2012 8:42 AM  I have personally obtained a history, examined the patient, evaluated imaging results, and formulated the assessment and plan of care. I agree with the above. Delia Heady, MD

## 2012-09-09 NOTE — Evaluation (Signed)
Speech Language Pathology Evaluation Patient Details Name: Stephen [name] MRN: [id number] DOB: Sep 09, 1950 Today's Date: 09/09/2012 Time: 4098-1191 SLP Time Calculation (min): 9 min  Problem List:  Patient Active Problem List   Diagnosis Date Noted  . CVA (cerebrovascular accident) 09/08/2012  . GERD (gastroesophageal reflux disease) 01/28/2012  . Restless leg 01/28/2012  . Acute kidney insufficiency 09/28/2011  . Iron deficiency anemia 06/21/2011  . Chronic back pain 05/09/2011  . Anorexia symptom 08/17/2010  . Routine health maintenance 08/17/2010  . Tremor 07/31/2010  . ADHESIVE CAPSULITIS, LEFT 07/05/2009  . 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 CHEST PAIN, INTERMITTENT 06/02/2007  . INTERMITTENT CLAUDICATION, BILATERAL 12/10/2006  . ABNRM FINDINGS, TB SKIN TEST W/O ACTIVE TB 07/19/2006  . HYPERLIPIDEMIA 06/12/2006  . HYPERTENSION 06/12/2006  . LUMBAGO 06/12/2006  . ABNORMAL RESULT, FUNCTION STUDY, EKG 06/12/2006   Past Medical History:  Past Medical History  Diagnosis Date  . Lumbago   . Latent tuberculosis   . Chest pain   . Intermittent claudication     bilateral  . Hyperlipidemia   . Hypertension   . Foot pain     bilateral  . Shoulder pain   . Arthropathy     shoulder  . Chronic cough   . Alcohol abuse    Past Surgical History:  Past Surgical History  Procedure Laterality Date  . Laminectomy  [id number]    left lumbar   . Appendectomy    . Hammer toe surgery      right 2nd. and 5th   HPI:  [name] is a 62 y.o. male presenting with right face numbness and RUE & RLE weakness and numbness. 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 Infarct felt to be thrombotic secondary to small vessel disease   Assessment / Plan / Recommendation Clinical Impression  Pt's speech/language/communcative function is WNL.  No f/u necessary.  Pt agrees with recs    SLP Assessment  Patient does not need any further Speech Lanaguage  Pathology Services          Pertinent Vitals/Pain No pain   SLP Goals     SLP Evaluation Prior Functioning  Cognitive/Linguistic Baseline: Within functional limits Type of Home: House Education: illiterate; no education beyond elementary school Vocation: Unemployed   Cognition  Overall Cognitive Status: Within Functional Limits for tasks assessed Orientation Level: Oriented X4    Comprehension  Auditory Comprehension Overall Auditory Comprehension: Appears within functional limits for tasks assessed Visual Recognition/Discrimination Discrimination: Within Function Limits Reading Comprehension Reading Status:  (illiterate)    Expression Expression Primary Mode of Expression: Verbal Verbal Expression Overall Verbal Expression: Appears within functional limits for tasks assessed Written Expression Dominant Hand: Right Written Expression: Not tested   Oral / Motor Oral Motor/Sensory Function Overall Oral Motor/Sensory Function: Appears within functional limits for tasks assessed Motor Speech Overall Motor Speech: Appears within functional limits for tasks assessed   GO   Stephen [name] L. Samson Frederic, [place] CCC/SLP Pager [phone]   Blenda Mounts Laurice 09/09/2012, 12:17 PM

## 2012-09-09 NOTE — Progress Notes (Signed)
VASCULAR LAB PRELIMINARY  PRELIMINARY  PRELIMINARY  PRELIMINARY  Carotid duplex completed.  Preliminary report:  Bilateral: 1-39% ICA stenosis. Right:  Vertebral artery absent.  Left: Vertebral artery flow is antegrade.    Vishaal Strollo, RVT 09/09/2012, 1:47 PM

## 2012-09-09 NOTE — Progress Notes (Signed)
*  PRELIMINARY RESULTS* Echocardiogram 2D Echocardiogram has been performed.  Stephen [name] 09/09/2012, 11:23 AM

## 2012-09-09 NOTE — H&P (Signed)
Date: 09/09/2012  Patient name: Stephen [name]  Medical record number: [id number]  Date of birth: 04-20-1950   I have seen and evaluated Melida Quitter and discussed their care with the Residency Team.  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 He also reports having resting tremor to right hand that has been in place for a few years, never worked up. Admitted to stroke evaluation  Physical Exam: Blood pressure 138/75, pulse 67, temperature 98.4 F (36.9 C), temperature source Oral, resp. rate 20, height 5\' 11"  (1.803 m), weight 162 lb 11.2 oz (73.8 kg), SpO2 99.00%. Physical Exam  Constitutional: He is oriented to person, place, and time. He appears well-developed and well-nourished. No distress.  HENT:  Mouth/Throat: Oropharynx is clear and moist. No oropharyngeal exudate.  Cardiovascular: Normal rate, regular rhythm and normal heart sounds. Exam reveals no gallop and no friction rub.  No murmur heard.  Pulmonary/Chest: Effort normal and breath sounds normal. No respiratory distress. He has no wheezes.  Abdominal: Soft. Bowel sounds are normal. He exhibits no distension. There is no tenderness.  Lymphadenopathy:  He has no cervical adenopathy.  Neurological: He is alert and oriented to person, place, and time. Right hand grip weakness Skin: Skin is warm and dry. No rash noted. No erythema.  Psychiatric: He has a normal mood and affect. His behavior is normal.    Lab results: Results for orders placed during the hospital encounter of 09/08/12 (from the past 24 hour(s))  LIPID PANEL     Status: Abnormal   Collection Time    09/09/12  4:55 AM      Result Value Range   Cholesterol 170  0 - 200 mg/dL   Triglycerides 562  <130 mg/dL   HDL 35 (*) >86 mg/dL   Total CHOL/HDL Ratio 4.9     VLDL 21  0 - 40 mg/dL   LDL Cholesterol 578 (*) 0 - 99 mg/dL  BASIC METABOLIC PANEL     Status: Abnormal   Collection Time    09/09/12  4:55 AM       Result Value Range   Sodium 137  135 - 145 mEq/L   Potassium 3.8  3.5 - 5.1 mEq/L   Chloride 100  96 - 112 mEq/L   CO2 24  19 - 32 mEq/L   Glucose, Bld 87  70 - 99 mg/dL   BUN 12  6 - 23 mg/dL   Creatinine, Ser 4.69  0.50 - 1.35 mg/dL   Calcium 9.7  8.4 - 62.9 mg/dL   GFR calc non Af Amer 85 (*) >90 mL/min   GFR calc Af Amer >90  >90 mL/min  HEMOGLOBIN A1C     Status: Abnormal   Collection Time    09/09/12  4:55 AM      Result Value Range   Hemoglobin A1C 6.2 (*) <5.7 %   Mean Plasma Glucose 131 (*) <117 mg/dL    Imaging results:  Ct Head (brain) Wo Contrast  09/08/2012   *RADIOLOGY REPORT*  Clinical Data: Right sided weakness evaluate for stroke  CT HEAD WITHOUT CONTRAST  Technique:  Contiguous axial images were obtained from the base of the skull through the vertex without contrast.  Comparison: None.  Findings: There is diffuse age related atrophy.  There is moderately severe low attenuation in the deep periventricular white matter bilaterally most consistent with chronic small vessel ischemic change.  There is no vascular territory infarct.  There is mild asymmetry in the deep white matter low attenuation, slightly greater in the region of the posterior limb of the left internal capsule when compared to the contralateral side.  Above the level of the ventricles, there is a rounded focus of hyperattenuation adjacent to the inner table of the skull measuring 12 mm in the right anterior parietal lobe.  This demonstrates attenuation value of 82.  There is no hydrocephalus or evidence of intracranial mass otherwise.  No extra-axial fluid.  IMPRESSION:  1.  Chronic involutional change with slight asymmetry, slightly worse on the left.  No vascular territory infarct appreciated. Cannot exclude lacunar infarct on the left. 2.  12 mm rounded focus of hyperattenuation on the right.  A lightly calcified meningioma is most likely.  Would recommend MRI to confirm this and to exclude intra-axial  mass or hemorrhage.   Original Report Authenticated By: Esperanza Heir, M.D.   Mr [facility] Wo Contrast  09/08/2012   *RADIOLOGY REPORT*  Clinical Data:  Stroke.  Numbness in extremity, weakness.  MRI HEAD WITHOUT AND WITH CONTRAST MRA HEAD WITHOUT CONTRAST MRA NECK WITHOUT AND WITH CONTRAST  Technique:  Multiplanar, multiecho pulse sequences of the brain and surrounding structures were obtained without and with intravenous contrast.  Angiographic images of the Circle of Willis were obtained using MRA technique without intravenous contrast. Angiographic images of the neck were obtained using MRA technique without and with intravenous contrast.  Carotid stenosis measurements (when applicable) are obtained utilizing NASCET criteria, using the distal internal carotid diameter as the denominator.  Contrast: 15mL MULTIHANCE GADOBENATE DIMEGLUMINE 529 MG/ML IV SOLN  Comparison:  Head CT 09/08/2012.  MRI HEAD  Findings:  Calvarium and upper cervical spine: Heterogeneous marrow in the upper cervical spine. There is a well-circumscribed 2.6 cm subcutaneous lesion in the right lower cheek, likely dermal inclusion cyst although imaged limitedly. This should readily apparent clinically.  Orbits: Unremarkable.  Sinuses: Clear. Mastoid and middle ears are clear.  Brain: Restricted diffusion within the posterior left putamen the left posterior corona radiata.  No hemorrhagic conversion.  Extensive remote ischemic injuries with lacunar infarctions in the inferior left cerebellum, posterior and paramedian right cerebellum, left caudate head, left thalamus, and right external capsule.  Confluent T2 and FLAIR hyperintense signal abnormality in the periventricular white matter consistent with chronic small vessel ischemic demyelination.  Cerebral atrophy.  Enhancing 16 mm T2 hypointense mass along the high right posterior frontal convexity, most consistent with meningioma.  IMPRESSION:  1.  Small acute perforator infarct involving  the posterior left putamen and posterior left corona radiata. 2.  Extensive remote small vessel ischemic injury to the infra and supratentorial brain.  3.  16 mm right frontal convexity meningioma.  MRA HEAD  Findings: Absent flow within the right V4 segment, related to occlusion of vertebral artery in the neck. No aneurysm. No significant basilar PCA stenosis.  The right PICA is not well seen, but the right AICA fills normally.  No acute abnormality involving the anterior circulation, such as large vessel occlusion or high-grade stenosis.  Mild luminal undulation of the proximal intracranial vessels, most likely atherosclerotic narrowings.  The left A2 has a more moderate to advanced to stenosis proximally.  Hypoplastic or absent right A1.  IMPRESSION:  1.  Occluded proximal right V4 segment.  The right PICA is not visualized but there is a present right AICA. 2.  Intracranial atherosclerosis, with notable left A2 stenosis.  MRA NECK  Findings: The nondominant right vertebral artery  has minimal flow proximally, both on time-of-flight and postcontrast weighted MRA. No significant carotid stenosis or luminal irregularity.  No evidence of dissection involving the carotids.  Critical Value/emergent results were called by telephone at the time of interpretation on 09/08/2012 at 2330 hours to Dr. Claudell Kyle, who verbally acknowledged these results.  IMPRESSION:  1.  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 2.  Widely patent carotid circulation.   Original Report Authenticated By: Tiburcio Pea   Mr Angiogram Neck W Wo Contrast  09/08/2012   *RADIOLOGY REPORT*  Clinical Data:  Stroke.  Numbness in extremity, weakness.  MRI HEAD WITHOUT AND WITH CONTRAST MRA HEAD WITHOUT CONTRAST MRA NECK WITHOUT AND WITH CONTRAST  Technique:  Multiplanar, multiecho pulse sequences of the brain and surrounding structures were obtained without and with intravenous contrast.  Angiographic images of the Circle of Willis were  obtained using MRA technique without intravenous contrast. Angiographic images of the neck were obtained using MRA technique without and with intravenous contrast.  Carotid stenosis measurements (when applicable) are obtained utilizing NASCET criteria, using the distal internal carotid diameter as the denominator.  Contrast: 15mL MULTIHANCE GADOBENATE DIMEGLUMINE 529 MG/ML IV SOLN  Comparison:  Head CT 09/08/2012.  MRI HEAD  Findings:  Calvarium and upper cervical spine: Heterogeneous marrow in the upper cervical spine. There is a well-circumscribed 2.6 cm subcutaneous lesion in the right lower cheek, likely dermal inclusion cyst although imaged limitedly. This should readily apparent clinically.  Orbits: Unremarkable.  Sinuses: Clear. Mastoid and middle ears are clear.  Brain: Restricted diffusion within the posterior left putamen the left posterior corona radiata.  No hemorrhagic conversion.  Extensive remote ischemic injuries with lacunar infarctions in the inferior left cerebellum, posterior and paramedian right cerebellum, left caudate head, left thalamus, and right external capsule.  Confluent T2 and FLAIR hyperintense signal abnormality in the periventricular white matter consistent with chronic small vessel ischemic demyelination.  Cerebral atrophy.  Enhancing 16 mm T2 hypointense mass along the high right posterior frontal convexity, most consistent with meningioma.  IMPRESSION:  1.  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 2.  Extensive remote small vessel ischemic injury to the infra and supratentorial brain.  3.  16 mm right frontal convexity meningioma.  MRA HEAD  Findings: Absent flow within the right V4 segment, related to occlusion of vertebral artery in the neck. No aneurysm. No significant basilar PCA stenosis.  The right PICA is not well seen, but the right AICA fills normally.  No acute abnormality involving the anterior circulation,  such as large vessel occlusion or high-grade stenosis.  Mild luminal undulation of the proximal intracranial vessels, most likely atherosclerotic narrowings.  The left A2 has a more moderate to advanced to stenosis proximally.  Hypoplastic or absent right A1.  IMPRESSION:  1.  Occluded proximal right V4 segment.  The right PICA is not visualized but there is a present right AICA. 2.  Intracranial atherosclerosis, with notable left A2 stenosis.  MRA NECK  Findings: The nondominant right vertebral artery has minimal flow proximally, both on time-of-flight and postcontrast weighted MRA. No significant carotid stenosis or luminal irregularity.  No evidence of dissection involving the carotids.  Critical Value/emergent results were called by telephone at the time of interpretation on 09/08/2012 at 2330 hours to Dr. Claudell Kyle, who verbally acknowledged these results.  IMPRESSION:  1.  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 2.  Widely patent carotid circulation.   Original Report  Authenticated By: Tiburcio Pea   Mr Brain W Wo Contrast  09/08/2012   *RADIOLOGY REPORT*  Clinical Data:  Stroke.  Numbness in extremity, weakness.  MRI HEAD WITHOUT AND WITH CONTRAST MRA HEAD WITHOUT CONTRAST MRA NECK WITHOUT AND WITH CONTRAST  Technique:  Multiplanar, multiecho pulse sequences of the brain and surrounding structures were obtained without and with intravenous contrast.  Angiographic images of the Circle of Willis were obtained using MRA technique without intravenous contrast. Angiographic images of the neck were obtained using MRA technique without and with intravenous contrast.  Carotid stenosis measurements (when applicable) are obtained utilizing NASCET criteria, using the distal internal carotid diameter as the denominator.  Contrast: 15mL MULTIHANCE GADOBENATE DIMEGLUMINE 529 MG/ML IV SOLN  Comparison:  Head CT 09/08/2012.  MRI HEAD  Findings:  Calvarium and upper cervical spine: Heterogeneous marrow in the  upper cervical spine. There is a well-circumscribed 2.6 cm subcutaneous lesion in the right lower cheek, likely dermal inclusion cyst although imaged limitedly. This should readily apparent clinically.  Orbits: Unremarkable.  Sinuses: Clear. Mastoid and middle ears are clear.  Brain: Restricted diffusion within the posterior left putamen the left posterior corona radiata.  No hemorrhagic conversion.  Extensive remote ischemic injuries with lacunar infarctions in the inferior left cerebellum, posterior and paramedian right cerebellum, left caudate head, left thalamus, and right external capsule.  Confluent T2 and FLAIR hyperintense signal abnormality in the periventricular white matter consistent with chronic small vessel ischemic demyelination.  Cerebral atrophy.  Enhancing 16 mm T2 hypointense mass along the high right posterior frontal convexity, most consistent with meningioma.  IMPRESSION:  1.  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 2.  Extensive remote small vessel ischemic injury to the infra and supratentorial brain.  3.  16 mm right frontal convexity meningioma.  MRA HEAD  Findings: Absent flow within the right V4 segment, related to occlusion of vertebral artery in the neck. No aneurysm. No significant basilar PCA stenosis.  The right PICA is not well seen, but the right AICA fills normally.  No acute abnormality involving the anterior circulation, such as large vessel occlusion or high-grade stenosis.  Mild luminal undulation of the proximal intracranial vessels, most likely atherosclerotic narrowings.  The left A2 has a more moderate to advanced to stenosis proximally.  Hypoplastic or absent right A1.  IMPRESSION:  1.  Occluded proximal right V4 segment.  The right PICA is not visualized but there is a present right AICA. 2.  Intracranial atherosclerosis, with notable left A2 stenosis.  MRA NECK  Findings: The nondominant right vertebral  artery has minimal flow proximally, both on time-of-flight and postcontrast weighted MRA. No significant carotid stenosis or luminal irregularity.  No evidence of dissection involving the carotids.  Critical Value/emergent results were called by telephone at the time of interpretation on 09/08/2012 at 2330 hours to Dr. Claudell Kyle, who verbally acknowledged these results.  IMPRESSION:  1.  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 2.  Widely patent carotid circulation.   Original Report Authenticated By: Tiburcio Pea    Assessment and Plan: I have seen and evaluated the patient as outlined above. I agree with the formulated Assessment and Plan as detailed in the residents' admission note, with the following changes:    Agree with assessment and plan as outlined by Dr. Claudell Kyle. Will undergo 2D echo and carotid U/S to evaluate for an reversible causes for stroke.  Judyann Munson, MD 8/12/20147:34 PM

## 2012-09-10 LAB — BASIC METABOLIC PANEL
BUN: 14 mg/dL (ref 6–23)
CO2: 25 mEq/L (ref 19–32)
Calcium: 10 mg/dL (ref 8.4–10.5)
Creatinine, Ser: 1.05 mg/dL (ref 0.50–1.35)

## 2012-09-10 MED ORDER — ATORVASTATIN CALCIUM 80 MG PO TABS: 80.0000 mg | ORAL_TABLET | Freq: Every day | ORAL | Status: DC

## 2012-09-10 NOTE — Care Management Note (Signed)
    Page 1 of 1   09/10/2012     3:35:28 PM   CARE MANAGEMENT NOTE 09/10/2012  Patient:  Stephen [name], Stephen [name]   Account Number:  [id number]  Date Initiated:  09/09/2012  Documentation initiated by:  Jiles Crocker  Subjective/Objective Assessment:   ADMITTED WITH New-onset right-sided weakness and numbness.     Action/Plan:   PCP:  Dede Query, MD  LIVES ALONE; CM FOLLOWING FOR DCP   Anticipated DC Date:  09/16/2012   Anticipated DC Plan:  HOME W HOME HEALTH SERVICES      DC Planning Services  CM consult      Choice offered to / List presented to:             Status of service:  In process, will continue to follow Medicare Important Message given?  NA - LOS <3 / Initial given by admissions (If response is "NO", the following Medicare IM given date fields will be blank) Date Medicare IM given:   Date Additional Medicare IM given:    Discharge Disposition:    Per UR Regulation:  Reviewed for med. necessity/level of care/duration of stay  If discussed at Long Length of Stay Meetings, dates discussed:    Comments:  09/10/12 1500 Elmer Bales RN, MSN, CM- Spoke with patient's wife regarding home health PT.  Pt's wife has requested [facility]. Marie with [facility] was notified and has accepted the referral. [facility] DME was notified of order for rolling walker for discharge today.   09/09/2012- B CHANDLER RN,BSN,MHA

## 2012-09-10 NOTE — Progress Notes (Signed)
Subjective: Stephen [name] is a pleasant 62yo male PMH HTN, HLD, PVD, GERD who presented to the hospital on 09/08/12 with acute right sided weakness and numbness. 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  Patient seen this AM. No complaints overnight. He denied any pain or difficulty sleeping. He says he feels that his strength is returning slowly. ON exam, hand strength is better than yesterday. Still with right-sided numbness. Tremor present still in right hand and left foot. He had a carotid doppler and ECHO yesterday. ECHO was completely normal, no wall motion abnormalities and Carotid Doppler showed b/l 1-29% stenosis and an absent R. Vertebral artery.  Objective: Vital signs in last 24 hours: Filed Vitals:   09/09/12 1850 09/09/12 2109 09/10/12 0200 09/10/12 0539  BP: 138/75 141/71 126/78 133/81  Pulse: 67 70 75 68  Temp: 98.4 F (36.9 C) 98.4 F (36.9 C) 98.4 F (36.9 C) 98.3 F (36.8 C)  TempSrc: Oral Oral Oral Oral  Resp: 20 20 20 20   Height:      Weight:      SpO2: 99% 100% 99% 100%   Weight change:   Intake/Output Summary (Last 24 hours) at 09/10/12 0804 Last data filed at 09/10/12 0201  Gross per 24 hour  Intake      0 ml  Output    550 ml  Net   -550 ml   Physical Exam: General: Alert, cooperative, and in no apparent distress HEENT: Vision grossly intact, oropharynx clear and non-erythematous  Neck: Full range of motion without pain, supple, no lymphadenopathy or carotid bruits Lungs: Clear to ascultation bilaterally, normal work of respiration, no wheezes, rales, ronchi Heart: Regular rate and rhythm, no murmurs, gallops, or rubs Abdomen: Soft, non-tender, non-distended, normal bowel sounds Extremities: No cyanosis, clubbing, or edema Neurologic: Alert & oriented X3, cranial nerves II-XII intact, strength grossly intact (very mild right-sided weakness present on exam), sensation intact to  light touch, w/ mild sensory losses on the right UE and LE. Resting tremor present in the right hand and left foot.  Lab Results: Basic Metabolic Panel:  Recent Labs Lab 09/08/12 1408 09/08/12 1431 09/09/12 0455  NA 136 139 137  K 3.3* 3.4* 3.8  CL 98 100 100  CO2 28  --  24  GLUCOSE 90 91 87  BUN 14 13 12   CREATININE 1.08 1.10 0.98  CALCIUM 10.3  --  9.7   Liver Function Tests:  Recent Labs Lab 09/08/12 1408  AST 19  ALT 14  ALKPHOS 76  BILITOT 0.2*  PROT 8.2  ALBUMIN 4.1   No results found for this basename: LIPASE, AMYLASE,  in the last 168 hours No results found for this basename: AMMONIA,  in the last 168 hours CBC:  Recent Labs Lab 09/08/12 1408 09/08/12 1431  WBC 9.9  --   NEUTROABS 4.5  --   HGB 14.2 15.0  HCT 39.7 44.0  MCV 82.5  --   PLT 269  --    Cardiac Enzymes:  Recent Labs Lab 09/08/12 1408  TROPONINI <0.30   BNP: No results found for this basename: PROBNP,  in the last 168 hours D-Dimer: No results found for this basename: DDIMER,  in the last 168 hours CBG:  Recent Labs Lab 09/08/12 1414  GLUCAP 89   Hemoglobin A1C:  Recent Labs Lab 09/09/12 0455  HGBA1C 6.2*   Fasting Lipid Panel:  Recent Labs Lab 09/09/12 0455  CHOL  170  HDL 35*  LDLCALC 114*  TRIG 103  CHOLHDL 4.9   Thyroid Function Tests: No results found for this basename: TSH, T4TOTAL, FREET4, T3FREE, THYROIDAB,  in the last 168 hours Coagulation:  Recent Labs Lab 09/08/12 1408  LABPROT 13.3  INR 1.03   Anemia Panel: No results found for this basename: VITAMINB12, FOLATE, FERRITIN, TIBC, IRON, RETICCTPCT,  in the last 168 hours Urine Drug Screen: Drugs of Abuse     Component Value Date/Time   LABOPIA NEG 06/12/2006 0000   COCAINSCRNUR NEG 06/12/2006 0000   LABBENZ NEG 06/12/2006 0000   AMPHETMU NEG 06/12/2006 0000    Alcohol Level: No results found for this basename: ETH,  in the last 168 hours  Micro Results: No results found for this or any  previous visit (from the past 240 hour(s)). Studies/Results: Ct Head (brain) Wo Contrast  09/08/2012   *RADIOLOGY REPORT*  Clinical Data: Right sided weakness evaluate for stroke  CT HEAD WITHOUT CONTRAST  Technique:  Contiguous axial images were obtained from the base of the skull through the vertex without contrast.  Comparison: None.  Findings: There is diffuse age related atrophy.  There is moderately severe low attenuation in the deep periventricular white matter bilaterally most consistent with chronic small vessel ischemic change.  There is no vascular territory infarct.  There is mild asymmetry in the deep white matter low attenuation, slightly greater in the region of the posterior limb of the left internal capsule when compared to the contralateral side.  Above the level of the ventricles, there is a rounded focus of hyperattenuation adjacent to the inner table of the skull measuring 12 mm in the right anterior parietal lobe.  This demonstrates attenuation value of 82.  There is no hydrocephalus or evidence of intracranial mass otherwise.  No extra-axial fluid.  IMPRESSION:  1.  Chronic involutional change with slight asymmetry, slightly worse on the left.  No vascular territory infarct appreciated. Cannot exclude lacunar infarct on the left. 2.  12 mm rounded focus of hyperattenuation on the right.  A lightly calcified meningioma is most likely.  Would recommend MRI to confirm this and to exclude intra-axial mass or hemorrhage.   Original Report Authenticated By: Esperanza Heir, M.D.   Mr [facility] Wo Contrast  09/08/2012   *RADIOLOGY REPORT*  Clinical Data:  Stroke.  Numbness in extremity, weakness.  MRI HEAD WITHOUT AND WITH CONTRAST MRA HEAD WITHOUT CONTRAST MRA NECK WITHOUT AND WITH CONTRAST  Technique:  Multiplanar, multiecho pulse sequences of the brain and surrounding structures were obtained without and with intravenous contrast.  Angiographic images of the Circle of Willis were obtained  using MRA technique without intravenous contrast. Angiographic images of the neck were obtained using MRA technique without and with intravenous contrast.  Carotid stenosis measurements (when applicable) are obtained utilizing NASCET criteria, using the distal internal carotid diameter as the denominator.  Contrast: 15mL MULTIHANCE GADOBENATE DIMEGLUMINE 529 MG/ML IV SOLN  Comparison:  Head CT 09/08/2012.  MRI HEAD  Findings:  Calvarium and upper cervical spine: Heterogeneous marrow in the upper cervical spine. There is a well-circumscribed 2.6 cm subcutaneous lesion in the right lower cheek, likely dermal inclusion cyst although imaged limitedly. This should readily apparent clinically.  Orbits: Unremarkable.  Sinuses: Clear. Mastoid and middle ears are clear.  Brain: Restricted diffusion within the posterior left putamen the left posterior corona radiata.  No hemorrhagic conversion.  Extensive remote ischemic injuries with lacunar infarctions in the inferior left cerebellum, posterior and  paramedian right cerebellum, left caudate head, left thalamus, and right external capsule.  Confluent T2 and FLAIR hyperintense signal abnormality in the periventricular white matter consistent with chronic small vessel ischemic demyelination.  Cerebral atrophy.  Enhancing 16 mm T2 hypointense mass along the high right posterior frontal convexity, most consistent with meningioma.  IMPRESSION:  1.  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 2.  Extensive remote small vessel ischemic injury to the infra and supratentorial brain.  3.  16 mm right frontal convexity meningioma.  MRA HEAD  Findings: Absent flow within the right V4 segment, related to occlusion of vertebral artery in the neck. No aneurysm. No significant basilar PCA stenosis.  The right PICA is not well seen, but the right AICA fills normally.  No acute abnormality involving the anterior circulation, such as  large vessel occlusion or high-grade stenosis.  Mild luminal undulation of the proximal intracranial vessels, most likely atherosclerotic narrowings.  The left A2 has a more moderate to advanced to stenosis proximally.  Hypoplastic or absent right A1.  IMPRESSION:  1.  Occluded proximal right V4 segment.  The right PICA is not visualized but there is a present right AICA. 2.  Intracranial atherosclerosis, with notable left A2 stenosis.  MRA NECK  Findings: The nondominant right vertebral artery has minimal flow proximally, both on time-of-flight and postcontrast weighted MRA. No significant carotid stenosis or luminal irregularity.  No evidence of dissection involving the carotids.  Critical Value/emergent results were called by telephone at the time of interpretation on 09/08/2012 at 2330 hours to Dr. Claudell Kyle, who verbally acknowledged these results.  IMPRESSION:  1.  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 2.  Widely patent carotid circulation.   Original Report Authenticated By: Tiburcio Pea   Mr Angiogram Neck W Wo Contrast  09/08/2012   *RADIOLOGY REPORT*  Clinical Data:  Stroke.  Numbness in extremity, weakness.  MRI HEAD WITHOUT AND WITH CONTRAST MRA HEAD WITHOUT CONTRAST MRA NECK WITHOUT AND WITH CONTRAST  Technique:  Multiplanar, multiecho pulse sequences of the brain and surrounding structures were obtained without and with intravenous contrast.  Angiographic images of the Circle of Willis were obtained using MRA technique without intravenous contrast. Angiographic images of the neck were obtained using MRA technique without and with intravenous contrast.  Carotid stenosis measurements (when applicable) are obtained utilizing NASCET criteria, using the distal internal carotid diameter as the denominator.  Contrast: 15mL MULTIHANCE GADOBENATE DIMEGLUMINE 529 MG/ML IV SOLN  Comparison:  Head CT 09/08/2012.  MRI HEAD  Findings:  Calvarium and upper cervical spine: Heterogeneous marrow in the  upper cervical spine. There is a well-circumscribed 2.6 cm subcutaneous lesion in the right lower cheek, likely dermal inclusion cyst although imaged limitedly. This should readily apparent clinically.  Orbits: Unremarkable.  Sinuses: Clear. Mastoid and middle ears are clear.  Brain: Restricted diffusion within the posterior left putamen the left posterior corona radiata.  No hemorrhagic conversion.  Extensive remote ischemic injuries with lacunar infarctions in the inferior left cerebellum, posterior and paramedian right cerebellum, left caudate head, left thalamus, and right external capsule.  Confluent T2 and FLAIR hyperintense signal abnormality in the periventricular white matter consistent with chronic small vessel ischemic demyelination.  Cerebral atrophy.  Enhancing 16 mm T2 hypointense mass along the high right posterior frontal convexity, most consistent with meningioma.  IMPRESSION:  1.  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 2.  Extensive  remote small vessel ischemic injury to the infra and supratentorial brain.  3.  16 mm right frontal convexity meningioma.  MRA HEAD  Findings: Absent flow within the right V4 segment, related to occlusion of vertebral artery in the neck. No aneurysm. No significant basilar PCA stenosis.  The right PICA is not well seen, but the right AICA fills normally.  No acute abnormality involving the anterior circulation, such as large vessel occlusion or high-grade stenosis.  Mild luminal undulation of the proximal intracranial vessels, most likely atherosclerotic narrowings.  The left A2 has a more moderate to advanced to stenosis proximally.  Hypoplastic or absent right A1.  IMPRESSION:  1.  Occluded proximal right V4 segment.  The right PICA is not visualized but there is a present right AICA. 2.  Intracranial atherosclerosis, with notable left A2 stenosis.  MRA NECK  Findings: The nondominant right vertebral  artery has minimal flow proximally, both on time-of-flight and postcontrast weighted MRA. No significant carotid stenosis or luminal irregularity.  No evidence of dissection involving the carotids.  Critical Value/emergent results were called by telephone at the time of interpretation on 09/08/2012 at 2330 hours to Dr. Claudell Kyle, who verbally acknowledged these results.  IMPRESSION:  1.  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 2.  Widely patent carotid circulation.   Original Report Authenticated By: Tiburcio Pea   Mr Brain W Wo Contrast  09/08/2012   *RADIOLOGY REPORT*  Clinical Data:  Stroke.  Numbness in extremity, weakness.  MRI HEAD WITHOUT AND WITH CONTRAST MRA HEAD WITHOUT CONTRAST MRA NECK WITHOUT AND WITH CONTRAST  Technique:  Multiplanar, multiecho pulse sequences of the brain and surrounding structures were obtained without and with intravenous contrast.  Angiographic images of the Circle of Willis were obtained using MRA technique without intravenous contrast. Angiographic images of the neck were obtained using MRA technique without and with intravenous contrast.  Carotid stenosis measurements (when applicable) are obtained utilizing NASCET criteria, using the distal internal carotid diameter as the denominator.  Contrast: 15mL MULTIHANCE GADOBENATE DIMEGLUMINE 529 MG/ML IV SOLN  Comparison:  Head CT 09/08/2012.  MRI HEAD  Findings:  Calvarium and upper cervical spine: Heterogeneous marrow in the upper cervical spine. There is a well-circumscribed 2.6 cm subcutaneous lesion in the right lower cheek, likely dermal inclusion cyst although imaged limitedly. This should readily apparent clinically.  Orbits: Unremarkable.  Sinuses: Clear. Mastoid and middle ears are clear.  Brain: Restricted diffusion within the posterior left putamen the left posterior corona radiata.  No hemorrhagic conversion.  Extensive remote ischemic injuries with lacunar infarctions in the inferior left cerebellum,  posterior and paramedian right cerebellum, left caudate head, left thalamus, and right external capsule.  Confluent T2 and FLAIR hyperintense signal abnormality in the periventricular white matter consistent with chronic small vessel ischemic demyelination.  Cerebral atrophy.  Enhancing 16 mm T2 hypointense mass along the high right posterior frontal convexity, most consistent with meningioma.  IMPRESSION:  1.  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 2.  Extensive remote small vessel ischemic injury to the infra and supratentorial brain.  3.  16 mm right frontal convexity meningioma.  MRA HEAD  Findings: Absent flow within the right V4 segment, related to occlusion of vertebral artery in the neck. No aneurysm. No significant basilar PCA stenosis.  The right PICA is not well seen, but the right AICA fills normally.  No acute abnormality involving the anterior circulation, such as large vessel occlusion or  high-grade stenosis.  Mild luminal undulation of the proximal intracranial vessels, most likely atherosclerotic narrowings.  The left A2 has a more moderate to advanced to stenosis proximally.  Hypoplastic or absent right A1.  IMPRESSION:  1.  Occluded proximal right V4 segment.  The right PICA is not visualized but there is a present right AICA. 2.  Intracranial atherosclerosis, with notable left A2 stenosis.  MRA NECK  Findings: The nondominant right vertebral artery has minimal flow proximally, both on time-of-flight and postcontrast weighted MRA. No significant carotid stenosis or luminal irregularity.  No evidence of dissection involving the carotids.  Critical Value/emergent results were called by telephone at the time of interpretation on 09/08/2012 at 2330 hours to Dr. Claudell Kyle, who verbally acknowledged these results.  IMPRESSION:  1.  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 2.  Widely patent carotid circulation.   Original Report  Authenticated By: Tiburcio Pea   Medications: I have reviewed the patient's current medications. Scheduled Meds: . amLODipine  10 mg Oral Daily  . atorvastatin  80 mg Oral q1800  . clopidogrel  75 mg Oral Q breakfast  . heparin  5,000 Units Subcutaneous Q8H  . hydrochlorothiazide  25 mg Oral Daily  . pantoprazole  20 mg Oral Daily   Continuous Infusions: . sodium chloride 125 mL/hr at 09/09/12 0003   PRN Meds:.senna-docusate Assessment/Plan: Advit Trethewey is a 62yo male PMH HTN, HLD, PVD, GERD who presents to the hospital with right sided weakness and numbness x 36 hours.   #Right sided weakness and numbness - Symptoms concerning for subcortical left MCA territory ischemic stroke. He is beyond the time frame for treatment with TPA. Risk factors include HTN, HLD, PVD. CT showed no acute intracranial abnormality, but a small area of hyperattentuation in the right parietal region that could represent a meningioma. 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 Patient received carotid dopplers and ECHO yesterday, both were normal. - Neurology on board. Signed off, said okay for d/c. - PT saw patient on 09/08/12, suggests home health PT w/ 24 hr supervision/assistance on discharge, as well as a rolling walker. Speech eval said the patient's speech, language, and communicative function are all wnl. No f/u necessary. - Continue home Plavix as prophylactic antiplatelet therapy  - HbA1c 6.2, fasting lipid panel shows HDL of 35 and LDL of 114. Started on atorvastatin 80 mg given risk factors. - Neuro checks every 2 hours x 12 hours, then every 4 hours   #Resting tremor RUE, LLE - Suggestive for Parkinson's disease given present at rest, improves with action, pill-rolling nature. RUE tremor noted in [facility] problem list as early as 08/01/2010.  - Per neuro will defer further assessment and treatment until resolution of acute medical  problems.  #HTN - Well controlled, currently 121/76.  - Continue home norvasc, HCTZ   #HLD - Checking fasting lipid panel; total cholesterol 170, HDL 35, and LDL 114 - Given his many risk factors and recent CVA, changing to atorvastatin 80 mg  #PVD - Continue Plavix, holding pletal   #GERD - Continue home protonix   #Hypokalemia - Last K 3.8, wnl. - Continue to replete as necessary   #DVT PPX - subq heparin, SCDs  Dispo: Disposition is deferred at this time, awaiting improvement of current medical problems.  Anticipated discharge today.  The patient does have a current PCP (Dede Query, MD) and does need an [facility] hospital follow-up appointment after discharge.  The patient does not have transportation limitations that hinder transportation to clinic appointments.  .Services Needed at time of discharge: Y = Yes, Blank = No PT: Y  OT: N  RN: N  Equipment: Y  Other: N    LOS: 2 days   Lars Masson, MD 09/10/2012, 8:04 AM Pager: [phone]

## 2012-09-10 NOTE — Progress Notes (Signed)
Stroke Team Progress Note  HISTORY Stephen [name] is an 62 y.o. male history of hypertension and hyperlipidemia who experienced onset of weakness and numbness involving right upper and lower extremities as well as numbness involving his right face about 6 AM yesterday 09/07/2012. He subsequently noticed difficulty with walking and experienced increased weakness involving his right lower extremity. He has experienced no changes in speech. There is no previous history of stroke nor TIA. Patient's been taking Plavix 75 mg per day. CT scan of his head showed no acute intracranial abnormality. Small area of hyperattenuation, likely meningioma, as noted in the right parietal region. NIH stroke score was 5. Patient was not a TPA candidate secondary to delay in arrival. He was admitted for further evaluation and treatment.  SUBJECTIVE Up in bed. No complaints.  OBJECTIVE Most recent Vital Signs: Filed Vitals:   09/09/12 1850 09/09/12 2109 09/10/12 0200 09/10/12 0539  BP: 138/75 141/71 126/78 133/81  Pulse: 67 70 75 68  Temp: 98.4 F (36.9 C) 98.4 F (36.9 C) 98.4 F (36.9 C) 98.3 F (36.8 C)  TempSrc: Oral Oral Oral Oral  Resp: 20 20 20 20   Height:      Weight:      SpO2: 99% 100% 99% 100%   CBG (last 3)   Recent Labs  09/08/12 1414  GLUCAP 89    IV Fluid Intake:   . sodium chloride 125 mL/hr at 09/09/12 0003    MEDICATIONS  . amLODipine  10 mg Oral Daily  . atorvastatin  80 mg Oral q1800  . clopidogrel  75 mg Oral Q breakfast  . heparin  5,000 Units Subcutaneous Q8H  . hydrochlorothiazide  25 mg Oral Daily  . pantoprazole  20 mg Oral Daily   PRN:  senna-docusate  Diet:  Cardiac thin liquids Activity:  OOB with assistance DVT Prophylaxis:  Heparin 5000 units sq tid   CLINICALLY SIGNIFICANT STUDIES Basic Metabolic Panel:   Recent Labs Lab 09/08/12 1408 09/08/12 1431 09/09/12 0455  NA 136 139 137  K 3.3* 3.4* 3.8  CL 98 100 100  CO2 28  --  24  GLUCOSE 90 91 87  BUN 14  13 12   CREATININE 1.08 1.10 0.98  CALCIUM 10.3  --  9.7   Liver Function Tests:   Recent Labs Lab 09/08/12 1408  AST 19  ALT 14  ALKPHOS 76  BILITOT 0.2*  PROT 8.2  ALBUMIN 4.1   CBC:   Recent Labs Lab 09/08/12 1408 09/08/12 1431  WBC 9.9  --   NEUTROABS 4.5  --   HGB 14.2 15.0  HCT 39.7 44.0  MCV 82.5  --   PLT 269  --    Coagulation:   Recent Labs Lab 09/08/12 1408  LABPROT 13.3  INR 1.03   Cardiac Enzymes:   Recent Labs Lab 09/08/12 1408  TROPONINI <0.30   Urinalysis: No results found for this basename: COLORURINE, APPERANCEUR, LABSPEC, PHURINE, GLUCOSEU, HGBUR, BILIRUBINUR, KETONESUR, PROTEINUR, UROBILINOGEN, NITRITE, LEUKOCYTESUR,  in the last 168 hours Lipid Panel    Component Value Date/Time   CHOL 170 09/09/2012 0455   TRIG 103 09/09/2012 0455   HDL 35* 09/09/2012 0455   CHOLHDL 4.9 09/09/2012 0455   VLDL 21 09/09/2012 0455   LDLCALC 114* 09/09/2012 0455   HgbA1C  Lab Results  Component Value Date   HGBA1C 6.2* 09/09/2012    Urine Drug Screen:     Component Value Date/Time   LABOPIA NEG 06/12/2006 0000   COCAINSCRNUR NEG  06/12/2006 0000   LABBENZ NEG 06/12/2006 0000   AMPHETMU NEG 06/12/2006 0000    Alcohol Level: No results found for this basename: ETH,  in the last 168 hours  CT of the brain  09/08/2012   1.  Chronic involutional change with slight asymmetry, slightly worse on the left.  No vascular territory infarct appreciated. Cannot exclude lacunar infarct on the left. 2.  12 mm rounded focus of hyperattenuation on the right.  A lightly calcified meningioma is most likely.  Would recommend MRI to confirm this and to exclude intra-axial mass or hemorrhage.    MRI of the brain  09/08/2012    1.  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 2.  Extensive remote small vessel ischemic injury to the infra and supratentorial brain.  3.  16 mm right frontal convexity meningioma.    MRA of the  brain  09/08/2012   1.  Occluded proximal right V4 segment.  The right PICA is not visualized but there is a present right AICA. 2.  Intracranial atherosclerosis, with notable left A2 stenosis.    MRA of the neck 09/08/2012    1.  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 2.  Widely patent carotid circulation.  2D Echocardiogram  LV function ok. no source of embolus.   Carotid Doppler  No evidence of hemodynamically significant internal carotid artery stenosis. No flow in the right vertebral, left vertebral artery flow is antegrade.   CXR    EKG  normal sinus rhythm.   Therapy Recommendations HH PT  Physical Exam   Pleasant middle aged male not in distress.Awake alert. Afebrile. Head is nontraumatic. Neck is supple without bruit. Hearing is normal. Cardiac exam no murmur or gallop. Lungs are clear to auscultation. Distal pulses are well felt. Neurological Exam : Awake alert oriented x3 with normal speech and language function. Extraocular movements are full range without nystagmus. Fundi were not visualized. Vision acuity and fields appear normal. No facial weakness. Tongue is midline. Motor system exam reveals no upper or lower extremity drift. Mild diminished fine finger movements on the right. Orbits left-to-right approximate the. No sensory loss. Coordination is intact. Gait was not tested.  ASSESSMENT [name] is a 62 y.o. male presenting with right face numbness and RUE & RLE weakness and numbness. 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 Infarct felt to be thrombotic secondary to small vessel disease.  On clopidogrel 75 mg orally every day prior to admission. Now on clopidogrel 75 mg orally every day for secondary stroke prevention. Patient with resultant right hemisensory loss, mild right hemiparesis. Work up completed.  Hypertension Hyperlipidemia, LDL 110, on zocor 20 PTA, now on zocor 20, goal LDL < 100 Marijuana use 1x wk  Extensive  intracranial small vessel ischemic  16 mm right frontal convexity meningioma.    Lumbago  latenent TB  Hx etoh use and smoking - last in 1988  HgbA1c 6.2  Lack of right VA flow insignificant to current stroke.  Hospital day # 2  TREATMENT/PLAN  Continue clopidogrel 75 mg orally every day for secondary stroke prevention.  OP PT preferred. If pt does not have transportation, ok for Home Health PT No further stroke workup indicated. Patient has a 10-15% risk of having another stroke over the next year, the highest risk is within 2 weeks of the most recent stroke/TIA (risk of having a stroke following a stroke or TIA is  the same). Ongoing risk factor control by Primary Care Physician Stroke Service will sign off. Please call should any needs arise. Follow up with Dr. Pearlean Brownie, Stroke Clinic, in 2 months.   Annie Main, MSN, RN, ANVP-BC, ANP-BC, [facility] Pager: [phone] 09/10/2012 10:19 AM  I have personally obtained a history, examined the patient, evaluated imaging results, and formulated the assessment and plan of care. I agree with the above.  Delia Heady, MD

## 2012-09-10 NOTE — Progress Notes (Signed)
Physical Therapy Treatment Patient Details Name: Stephen [name] MRN: [id number] DOB: [birth year] Today's Date: 09/10/2012 Time: 7829-5621 PT Time Calculation (min): 28 min  PT Assessment / Plan / Recommendation  History of Present Illness Patient is a 62 y/o male admitted with left sided weakness, positive for acute infarct posterior left putamen and post left corona radiata.   PT Comments   Patient with increased right knee recurvatum in terminal stance and on stairs.  Also still slightly unsteady on turns with walker.  States wife and daughter will be able to provide 24 hour assist at d/c.  Will be safe with 24 hour assist and HHPT.  Follow Up Recommendations  Home health PT;Supervision/Assistance - 24 hour           Equipment Recommendations  Rolling walker with 5" wheels;3in1 (PT) (tub bench)       Frequency Min 4X/week   Progress towards PT Goals Progress towards PT goals: Progressing toward goals  Plan Current plan remains appropriate    Precautions / Restrictions Precautions Precautions: Fall Restrictions Weight Bearing Restrictions: No   Pertinent Vitals/Pain Denies pain    Mobility  Bed Mobility Supine to Sit: 6: Modified independent (Device/Increase time) Sitting - Scoot to Edge of Bed: 6: Modified independent (Device/Increase time) Transfers Transfers: Sit to Stand;Stand to Sit Sit to Stand: From bed;4: Min guard Stand to Sit: 5: Supervision;To chair/3-in-1 Details for Transfer Assistance: cues for hand placement; initially trying to pull up on walker, needed cues to push from bed with increased success Ambulation/Gait Ambulation/Gait Assistance: 5: Supervision;4: Min guard Ambulation Distance (Feet): 250 Feet Assistive device: Rolling walker Ambulation/Gait Assistance Details: increased unsteadiness on turns with cues for safety, noted right recurvatum in terminal stance and on stairs.  Educated in hip extensor control during stance phase to eliminate  recurvatum. Gait Pattern: Step-through pattern;Decreased hip/knee flexion - right;Decreased stride length;Decreased dorsiflexion - right Stairs: Yes Stairs Assistance: 4: Min guard;4: Min assist Stairs Assistance Details (indicate cue type and reason): cues for sequence, assist on descending with hand held and rail Stair Management Technique: One rail Left;Step to pattern;Forwards;Other (comment) (hand held on left to descend) Number of Stairs: 4 Modified Rankin (Stroke Patients Only) Modified Rankin: Moderately severe disability    Exercises Other Exercises Other Exercises: sit<>stand x 5 from bed without UE assist, cues for equal weight shift Other Exercises: also educated pt in fall reduction techniques   PT Goals (current goals can now be found in the care plan section)    Visit Information  Last PT Received On: 09/10/12 History of Present Illness: Patient is a 62 y/o male admitted with left sided weakness, positive for acute infarct posterior left putamen and post left corona radiata.    Subjective Data   Knew you were coming today   Cognition  Cognition Arousal/Alertness: Awake/alert Behavior During Therapy: WFL for tasks assessed/performed Overall Cognitive Status: Within Functional Limits for tasks assessed    Balance   static stance with no UE support supervision  End of Session PT - End of Session Equipment Utilized During Treatment: Gait belt Activity Tolerance: Patient tolerated treatment well Patient left: in chair;with call bell/phone within reach   GP     [facility] 09/10/2012, 1:15 PM [place], [place] [phone] 09/10/2012

## 2012-09-10 NOTE — Progress Notes (Signed)
  Date: 09/10/2012  Patient name: Stephen [name]  Medical record number: [id number]  Date of birth: 06-08-1950   This patient has been seen and the plan of care was discussed with the house staff. Please see their note for complete details. I concur with their findings with the following additions/corrections:  Agree with plan as outlined by Dr. Yetta Barre. Patient is ready for discharge with appropriate follow up and home health services to manage his stroke. Reiterated with the patient that he is to seek care immediately if having similar stroke like symptoms.  Judyann Munson, MD 09/10/2012, 10:32 PM

## 2012-09-10 NOTE — Progress Notes (Signed)
Discharge instructions given. Pt verbalized understanding and all questions were answered.  

## 2012-09-12 ENCOUNTER — Other Ambulatory Visit: Payer: Self-pay | Admitting: Cardiovascular Disease

## 2012-09-12 ENCOUNTER — Telehealth: Payer: Self-pay | Admitting: Neurology

## 2012-09-13 NOTE — Discharge Summary (Signed)
Name: Stephen [name] MRN: [id number] DOB: May 13, 1950 62 y.o. PCP: Dede Query, MD  Date of Admission: 09/08/2012  2:43 PM Date of Discharge: 09/10/12 Attending Physician: Judyann Munson  Discharge Diagnosis: 1. CVA- Patient  Presented w/ acute symptoms of R sided weakness and numbness. Found to have embolic stroke on MRI. Strength improved gradually during hospital course. 2. Resting tremor- Present prior to his admission. Further assessment after resolution of acute symptoms after discharge. 3. HTN 4. HLD 5. PVD 6. GERD  Discharge Medications:   Medication List    STOP taking these medications       simvastatin 20 MG tablet  Commonly known as:  ZOCOR      TAKE these medications       amLODipine 10 MG tablet  Commonly known as:  NORVASC  Take 1 tablet (10 mg total) by mouth daily.     atorvastatin 80 MG tablet  Commonly known as:  LIPITOR  Take 1 tablet (80 mg total) by mouth daily at 6 PM.     cilostazol 100 MG tablet  Commonly known as:  PLETAL  TAKE 1 TABLET BY MOUTH TWICE A DAY     clopidogrel 75 MG tablet  Commonly known as:  PLAVIX  TAKE 1 TABLET (75 MG TOTAL) BY MOUTH DAILY.     hydrochlorothiazide 25 MG tablet  Commonly known as:  HYDRODIURIL  TAKE 1 TABLET BY MOUTH EVERY DAY     pantoprazole 20 MG tablet  Commonly known as:  PROTONIX  Take 1 tablet (20 mg total) by mouth daily.     potassium chloride 10 MEQ tablet  Commonly known as:  K-DUR,KLOR-CON  Take 10 mEq by mouth daily.        Disposition and follow-up:   [name] was discharged from [facility] in Good condition.  At the hospital follow up visit please address:  1.  Home PT visits, improving weakness, numbness. Extent of tremor. Discuss scheduling outpatient visit w/ neurosurgery regarding meningioma.  2.  Labs / imaging needed at time of follow-up: none  3.  Pending labs/ test needing follow-up: none  Follow-up Appointments:     Follow-up Information   Follow up  with Gates Rigg, MD. Schedule an appointment as soon as possible for a visit in 2 months. (stroke clinic)    Specialties:  Neurology, Radiology   Contact information:   [address] [place] [address] [phone]       Follow up with Boykin Peek, MD On 09/18/2012. (9:45 PM)    Specialty:  Internal Medicine   Contact information:   [address] [facility] [place] [address] [phone]       Discharge Instructions: Discharge Orders   Future Appointments Provider Department Dept Phone   09/18/2012 9:45 AM Boykin Peek, MD [facility] [phone]   Future Orders Complete By Expires   Call MD for:  difficulty breathing, headache or visual disturbances  As directed    Call MD for:  persistant dizziness or light-headedness  As directed    Call MD for:  persistant nausea and vomiting  As directed    Diet - low sodium heart healthy  As directed    Increase activity slowly  As directed       Consultations:  Neurology  Procedures Performed:  Ct Head (brain) Wo Contrast  09/08/2012   *RADIOLOGY REPORT*  Clinical Data: Right sided weakness evaluate for stroke  CT  HEAD WITHOUT CONTRAST  Technique:  Contiguous axial images were obtained from the base of the skull through the vertex without contrast.  Comparison: None.  Findings: There is diffuse age related atrophy.  There is moderately severe low attenuation in the deep periventricular white matter bilaterally most consistent with chronic small vessel ischemic change.  There is no vascular territory infarct.  There is mild asymmetry in the deep white matter low attenuation, slightly greater in the region of the posterior limb of the left internal capsule when compared to the contralateral side.  Above the level of the ventricles, there is a rounded focus of hyperattenuation adjacent to the inner table of the skull measuring 12 mm in the right anterior  parietal lobe.  This demonstrates attenuation value of 82.  There is no hydrocephalus or evidence of intracranial mass otherwise.  No extra-axial fluid.  IMPRESSION:  1.  Chronic involutional change with slight asymmetry, slightly worse on the left.  No vascular territory infarct appreciated. Cannot exclude lacunar infarct on the left. 2.  12 mm rounded focus of hyperattenuation on the right.  A lightly calcified meningioma is most likely.  Would recommend MRI to confirm this and to exclude intra-axial mass or hemorrhage.   Original Report Authenticated By: Esperanza Heir, M.D.   Mr Stephen [name] Wo Contrast  09/08/2012   *RADIOLOGY REPORT*  Clinical Data:  Stroke.  Numbness in extremity, weakness.  MRI HEAD WITHOUT AND WITH CONTRAST MRA HEAD WITHOUT CONTRAST MRA NECK WITHOUT AND WITH CONTRAST  Technique:  Multiplanar, multiecho pulse sequences of the brain and surrounding structures were obtained without and with intravenous contrast.  Angiographic images of the Circle of Willis were obtained using MRA technique without intravenous contrast. Angiographic images of the neck were obtained using MRA technique without and with intravenous contrast.  Carotid stenosis measurements (when applicable) are obtained utilizing NASCET criteria, using the distal internal carotid diameter as the denominator.  Contrast: 15mL MULTIHANCE GADOBENATE DIMEGLUMINE 529 MG/ML IV SOLN  Comparison:  Head CT 09/08/2012.  MRI HEAD  Findings:  Calvarium and upper cervical spine: Heterogeneous marrow in the upper cervical spine. There is a well-circumscribed 2.6 cm subcutaneous lesion in the right lower cheek, likely dermal inclusion cyst although imaged limitedly. This should readily apparent clinically.  Orbits: Unremarkable.  Sinuses: Clear. Mastoid and middle ears are clear.  Brain: Restricted diffusion within the posterior left putamen the left posterior corona radiata.  No hemorrhagic conversion.  Extensive remote ischemic injuries with  lacunar infarctions in the inferior left cerebellum, posterior and paramedian right cerebellum, left caudate head, left thalamus, and right external capsule.  Confluent T2 and FLAIR hyperintense signal abnormality in the periventricular white matter consistent with chronic small vessel ischemic demyelination.  Cerebral atrophy.  Enhancing 16 mm T2 hypointense mass along the high right posterior frontal convexity, most consistent with meningioma.  IMPRESSION:  1.  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 2.  Extensive remote small vessel ischemic injury to the infra and supratentorial brain.  3.  16 mm right frontal convexity meningioma.  MRA HEAD  Findings: Absent flow within the right V4 segment, related to occlusion of vertebral artery in the neck. No aneurysm. No significant basilar PCA stenosis.  The right PICA is not well seen, but the right AICA fills normally.  No acute abnormality involving the anterior circulation, such as large vessel occlusion or high-grade stenosis.  Mild luminal undulation of the proximal intracranial vessels, most likely atherosclerotic  narrowings.  The left A2 has a more moderate to advanced to stenosis proximally.  Hypoplastic or absent right A1.  IMPRESSION:  1.  Occluded proximal right V4 segment.  The right PICA is not visualized but there is a present right AICA. 2.  Intracranial atherosclerosis, with notable left A2 stenosis.  MRA NECK  Findings: The nondominant right vertebral artery has minimal flow proximally, both on time-of-flight and postcontrast weighted MRA. No significant carotid stenosis or luminal irregularity.  No evidence of dissection involving the carotids.  Critical Value/emergent results were called by telephone at the time of interpretation on 09/08/2012 at 2330 hours to Dr. Claudell Kyle, who verbally acknowledged these results.  IMPRESSION:  1.  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 2.   Widely patent carotid circulation.   Original Report Authenticated By: Tiburcio Pea   Mr Angiogram Neck W Wo Contrast  09/08/2012   *RADIOLOGY REPORT*  Clinical Data:  Stroke.  Numbness in extremity, weakness.  MRI HEAD WITHOUT AND WITH CONTRAST MRA HEAD WITHOUT CONTRAST MRA NECK WITHOUT AND WITH CONTRAST  Technique:  Multiplanar, multiecho pulse sequences of the brain and surrounding structures were obtained without and with intravenous contrast.  Angiographic images of the Circle of Willis were obtained using MRA technique without intravenous contrast. Angiographic images of the neck were obtained using MRA technique without and with intravenous contrast.  Carotid stenosis measurements (when applicable) are obtained utilizing NASCET criteria, using the distal internal carotid diameter as the denominator.  Contrast: 15mL MULTIHANCE GADOBENATE DIMEGLUMINE 529 MG/ML IV SOLN  Comparison:  Head CT 09/08/2012.  MRI HEAD  Findings:  Calvarium and upper cervical spine: Heterogeneous marrow in the upper cervical spine. There is a well-circumscribed 2.6 cm subcutaneous lesion in the right lower cheek, likely dermal inclusion cyst although imaged limitedly. This should readily apparent clinically.  Orbits: Unremarkable.  Sinuses: Clear. Mastoid and middle ears are clear.  Brain: Restricted diffusion within the posterior left putamen the left posterior corona radiata.  No hemorrhagic conversion.  Extensive remote ischemic injuries with lacunar infarctions in the inferior left cerebellum, posterior and paramedian right cerebellum, left caudate head, left thalamus, and right external capsule.  Confluent T2 and FLAIR hyperintense signal abnormality in the periventricular white matter consistent with chronic small vessel ischemic demyelination.  Cerebral atrophy.  Enhancing 16 mm T2 hypointense mass along the high right posterior frontal convexity, most consistent with meningioma.  IMPRESSION:  1.  Small acute perforator  infarct involving the posterior left putamen and posterior left corona radiata. 2.  Extensive remote small vessel ischemic injury to the infra and supratentorial brain.  3.  16 mm right frontal convexity meningioma.  MRA HEAD  Findings: Absent flow within the right V4 segment, related to occlusion of vertebral artery in the neck. No aneurysm. No significant basilar PCA stenosis.  The right PICA is not well seen, but the right AICA fills normally.  No acute abnormality involving the anterior circulation, such as large vessel occlusion or high-grade stenosis.  Mild luminal undulation of the proximal intracranial vessels, most likely atherosclerotic narrowings.  The left A2 has a more moderate to advanced to stenosis proximally.  Hypoplastic or absent right A1.  IMPRESSION:  1.  Occluded proximal right V4 segment.  The right PICA is not visualized but there is a present right AICA. 2.  Intracranial atherosclerosis, with notable left A2 stenosis.  MRA NECK  Findings: The nondominant right vertebral artery has minimal flow proximally, both on time-of-flight and postcontrast weighted MRA. No  significant carotid stenosis or luminal irregularity.  No evidence of dissection involving the carotids.  Critical Value/emergent results were called by telephone at the time of interpretation on 09/08/2012 at 2330 hours to Dr. Claudell Kyle, who verbally acknowledged these results.  IMPRESSION:  1.  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 2.  Widely patent carotid circulation.   Original Report Authenticated By: Tiburcio Pea   Mr Brain W Wo Contrast  09/08/2012   *RADIOLOGY REPORT*  Clinical Data:  Stroke.  Numbness in extremity, weakness.  MRI HEAD WITHOUT AND WITH CONTRAST MRA HEAD WITHOUT CONTRAST MRA NECK WITHOUT AND WITH CONTRAST  Technique:  Multiplanar, multiecho pulse sequences of the brain and surrounding structures were obtained without and with intravenous contrast.  Angiographic images of the Circle of Willis  were obtained using MRA technique without intravenous contrast. Angiographic images of the neck were obtained using MRA technique without and with intravenous contrast.  Carotid stenosis measurements (when applicable) are obtained utilizing NASCET criteria, using the distal internal carotid diameter as the denominator.  Contrast: 15mL MULTIHANCE GADOBENATE DIMEGLUMINE 529 MG/ML IV SOLN  Comparison:  Head CT 09/08/2012.  MRI HEAD  Findings:  Calvarium and upper cervical spine: Heterogeneous marrow in the upper cervical spine. There is a well-circumscribed 2.6 cm subcutaneous lesion in the right lower cheek, likely dermal inclusion cyst although imaged limitedly. This should readily apparent clinically.  Orbits: Unremarkable.  Sinuses: Clear. Mastoid and middle ears are clear.  Brain: Restricted diffusion within the posterior left putamen the left posterior corona radiata.  No hemorrhagic conversion.  Extensive remote ischemic injuries with lacunar infarctions in the inferior left cerebellum, posterior and paramedian right cerebellum, left caudate head, left thalamus, and right external capsule.  Confluent T2 and FLAIR hyperintense signal abnormality in the periventricular white matter consistent with chronic small vessel ischemic demyelination.  Cerebral atrophy.  Enhancing 16 mm T2 hypointense mass along the high right posterior frontal convexity, most consistent with meningioma.  IMPRESSION:  1.  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 2.  Extensive remote small vessel ischemic injury to the infra and supratentorial brain.  3.  16 mm right frontal convexity meningioma.  MRA HEAD  Findings: Absent flow within the right V4 segment, related to occlusion of vertebral artery in the neck. No aneurysm. No significant basilar PCA stenosis.  The right PICA is not well seen, but the right AICA fills normally.  No acute abnormality involving the anterior  circulation, such as large vessel occlusion or high-grade stenosis.  Mild luminal undulation of the proximal intracranial vessels, most likely atherosclerotic narrowings.  The left A2 has a more moderate to advanced to stenosis proximally.  Hypoplastic or absent right A1.  IMPRESSION:  1.  Occluded proximal right V4 segment.  The right PICA is not visualized but there is a present right AICA. 2.  Intracranial atherosclerosis, with notable left A2 stenosis.  MRA NECK  Findings: The nondominant right vertebral artery has minimal flow proximally, both on time-of-flight and postcontrast weighted MRA. No significant carotid stenosis or luminal irregularity.  No evidence of dissection involving the carotids.  Critical Value/emergent results were called by telephone at the time of interpretation on 09/08/2012 at 2330 hours to Dr. Claudell Kyle, who verbally acknowledged these results.  IMPRESSION:  1.  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 2.  Widely patent carotid circulation.   Original Report Authenticated By: Tiburcio Pea    2D Echo: 09/09/12 Study Conclusions: -  Left ventricle: The cavity size was normal. Systolic function was normal. Wall motion was normal; there were no regional wall motion abnormalities. Left ventricular diastolic function parameters were normal. - Left atrium: The atrium was mildly dilated.  Admission HPI:  Stephen [name] is a pleasant 62yo male PMH HTN, HLD, PVD, GERD who presents to the hospital with acute right sided weakness and numbness.  The patient first noticed the symptoms yesterday (09/07/12) at about 6am, after awaking from sleep. Specifically he felt a numb, "tingling" sensation in his right face, arm, and leg. He subsequently noted difficulty walking; he felt like he "could not plant his foot" correctly and this made him feel off balance. He had to grab hold of the walls to keep from falling. He also experienced some weakness of the right upper extremity. He was  reluctant to come to the hospital today, but his wife insisted when his symptoms did not improve after >36 hours.  Denies speech changes, slurring of words, word-finding problems. Denies double vision or blurry vision. Denies facial dropping. Denies a sensation of the room spinning. No new hand clumsiness, but states he has had a tremor in his right hand x months that makes holding a fork harder. Denies headache, neck pain. Denies chest pain, palpitations, SOB. He has no prior history of stroke or TIA. He has been taking Plavix 75 mg per day for PVD. He has been abstinent from alcohol and tobacco x30 years. Endorses occasional recreational marijuana use, last use ~1 week ago.  In the ED, CT scan of his head showed no acute intracranial abnormality. A small area of hyperattenuation, likely meningioma, was noted in the right parietal region. Dr. Roseanne Reno (neurology) saw him in the ED. NIH stroke score was 5 (minor-moderate).  Hospital Course by problem list: Principal Problem:   CVA (cerebrovascular accident) Active Problems:   HYPERLIPIDEMIA   HYPERTENSION   Tremor   GERD (gastroesophageal reflux disease)   1. CVA- Stephen [name] presented to the hospital w/ right sided weakness and numbness for ~36 hours prior to admission. These symptoms were concerning for a subcortical L MCA ischemic stroke. The patient upon arrival was well beyond the possibility of using tPA therapy. He has multiple risk factors for CVA including HTN, HLD, and PVD. A CT was performed in the ED and ruled out an acute intracranial abnormality, however there was a small area of hyperattenuation in the R parietal region, representing a possible meningioma. MRI was performed showing small acute perforator infarct involving the posterior left putamen and posterior left corona radiata w/ extensive remote small vessel ischemic injury to the infra and supratentorial brain. The patient was admitted to tele and neurology was consulted. PT, OT, and  SLP was consulted. He was continued on his home dose of clopidogrel, 75 mg po qd for secondary stroke prevention. A carotid doppler and 2D ECHO were also performed, both of which showed no significant abnormalities. The patient was also started on Lipitor 80 mg qhs (changed from home dose of zocor 20 mg) d/t high risk for future strokes. LDL was shown to be 114, total cholesterol 170, and HDL 35. The patient progressively regained some strength to his right side, but still continued w/ sensory disturbances. PT recommended home PT and a roller walker which was provided on discharge. SLP evaluation showed no abnormalities and the patient's diet was advanced. Stephen [name] continued to regain some strength to his right side and was discharged with proper neurology follow up as well as in  the [name]. Consider future evaluation of meningioma by neurosurgery.  2. Resting tremor- On chart review, it was shown that this is not a new issue, involving the RUE and LLE. Patient says it has not progressed recently. 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 Neurology differed further assessment of tremor until acute issues are resolved. Will follow up with neurology for possible Parkinson's Disease.  3. HTN- Blood pressure well controlled on admission. His home medications, norvasc and HCTZ were continued on admission.  4. HLD- Given the patient's history of HLD in the past along with recent CVA, home dose of zocor 20 mg was changed to 80 mg lipitor during his admission, and continued on discharge.   5. PVD- Stephen [name] was continued on his home dose of Plavix, given his acute CVA. Pletal was held during admission.  6. Hypokalemia- The patient presented w/ some mild hypokalemia, which was repleted and no longer continued to be an issue during his admission. Marland Kitchen Discharge Vitals:   BP 137/78  Pulse 90  Temp(Src) 98.4 F (36.9 C) (Oral)  Resp 18  Ht 5\' 11"  (1.803 m)  Wt 162 lb 11.2 oz (73.8 kg)  BMI 22.7  kg/m2  SpO2 100%  Discharge Labs:  No results found for this or any previous visit (from the past 24 hour(s)).  Signed: Lars Masson, MD 09/13/2012, 3:47 PM   Time Spent on Discharge: 35 minutes Services Ordered on Discharge: none Equipment Ordered on Discharge: none

## 2012-09-15 ENCOUNTER — Other Ambulatory Visit: Payer: Self-pay | Admitting: Cardiovascular Disease

## 2012-09-16 NOTE — Telephone Encounter (Signed)
LMVM for Stephen [name], with [facility]?   I gave verbal order for OT and SW for pt. She is to call me back about placing order for OT

## 2012-09-16 NOTE — Discharge Summary (Signed)
  Date: 09/16/2012  Patient name: Stephen [name]  Medical record number: [id number]  Date of birth: 17-Dec-1950   This patient has been seen and the plan of care was discussed with the house staff. Please see their note for complete details. I concur with their findings with the following additions/corrections:  Agree with discharge plan as outlined by Dr. Yetta Barre. We will refer to outpatient neurology for further management of stroke but also to assess tremors to see if consistent with parkison's disease  Judyann Munson, MD 09/16/2012, 9:45 PM

## 2012-09-16 NOTE — Telephone Encounter (Signed)
Tiffany with [facility] called back and she feels that pt needs OT for R arm, hand function, and also financial resource information for social work to assist.  Gave the verbal order to her for these services.  She will place order and Dr. Pearlean Brownie to sign when papers come across.

## 2012-09-18 ENCOUNTER — Encounter: Payer: Self-pay | Admitting: Internal Medicine

## 2012-09-18 ENCOUNTER — Ambulatory Visit: Payer: Medicare Other | Admitting: Internal Medicine

## 2012-09-18 ENCOUNTER — Ambulatory Visit (INDEPENDENT_AMBULATORY_CARE_PROVIDER_SITE_OTHER): Payer: Medicare Other | Admitting: Internal Medicine

## 2012-09-18 VITALS — BP 127/78 | HR 81 | Temp 98.2°F | Ht 71.0 in | Wt 168.2 lb

## 2012-09-18 DIAGNOSIS — I10 Essential (primary) hypertension: Secondary | ICD-10-CM

## 2012-09-18 DIAGNOSIS — E875 Hyperkalemia: Secondary | ICD-10-CM

## 2012-09-18 DIAGNOSIS — I6350 Cerebral infarction due to unspecified occlusion or stenosis of unspecified cerebral artery: Secondary | ICD-10-CM

## 2012-09-18 DIAGNOSIS — I639 Cerebral infarction, unspecified: Secondary | ICD-10-CM

## 2012-09-18 LAB — BASIC METABOLIC PANEL
BUN: 20 mg/dL (ref 6–23)
Calcium: 10.4 mg/dL (ref 8.4–10.5)
Creat: 1.24 mg/dL (ref 0.50–1.35)
Glucose, Bld: 82 mg/dL (ref 70–99)
Sodium: 131 mEq/L — ABNORMAL LOW (ref 135–145)

## 2012-09-18 NOTE — Patient Instructions (Addendum)
1. Will send referral to neurologist and neurosurgeon. 2. Please call the clinic and talk to [facility] about your medications once you receive them tomorrow. 3. Follow up in 3 months.   2 Gram Low Sodium Diet A 2 gram sodium diet restricts the amount of sodium in the diet to no more than 2 g or 2000 mg daily. Limiting the amount of sodium is often used to help lower blood pressure. It is important if you have heart, liver, or kidney problems. Many foods contain sodium for flavor and sometimes as a preservative. When the amount of sodium in a diet needs to be low, it is important to know what to look for when choosing foods and drinks. The following includes some information and guidelines to help make it easier for you to adapt to a low sodium diet. QUICK TIPS  Do not add salt to food.  Avoid convenience items and fast food.  Choose unsalted snack foods.  Buy lower sodium products, often labeled as "lower sodium" or "no salt added."  Check food labels to learn how much sodium is in 1 serving.  When eating at a restaurant, ask that your food be prepared with less salt or none, if possible. READING FOOD LABELS FOR SODIUM INFORMATION The nutrition facts label is a good place to find how much sodium is in foods. Look for products with no more than 500 to 600 mg of sodium per meal and no more than 150 mg per serving. Remember that 2 g = 2000 mg. The food label may also list foods as:  Sodium-free: Less than 5 mg in a serving.  Very low sodium: 35 mg or less in a serving.  Low-sodium: 140 mg or less in a serving.  Light in sodium: 50% less sodium in a serving. For example, if a food that usually has 300 mg of sodium is changed to become light in sodium, it will have 150 mg of sodium.  Reduced sodium: 25% less sodium in a serving. For example, if a food that usually has 400 mg of sodium is changed to reduced sodium, it will have 300 mg of sodium. CHOOSING FOODS Grains  Avoid: Salted crackers  and snack items. Some cereals, including instant hot cereals. Bread stuffing and biscuit mixes. Seasoned rice or pasta mixes.  Choose: Unsalted snack items. Low-sodium cereals, oats, puffed wheat and rice, shredded wheat. English muffins and bread. Pasta. Meats  Avoid: Salted, canned, smoked, spiced, pickled meats, including fish and poultry. Bacon, ham, sausage, cold cuts, hot dogs, anchovies.  Choose: Low-sodium canned tuna and salmon. Fresh or frozen meat, poultry, and fish. Dairy  Avoid: Processed cheese and spreads. Cottage cheese. Buttermilk and condensed milk. Regular cheese.  Choose: Milk. Low-sodium cottage cheese. Yogurt. Sour cream. Low-sodium cheese. Fruits and Vegetables  Avoid: Regular canned vegetables. Regular canned tomato sauce and paste. Frozen vegetables in sauces. Olives. Rosita Fire. Relishes. Sauerkraut.  Choose: Low-sodium canned vegetables. Low-sodium tomato sauce and paste. Frozen or fresh vegetables. Fresh and frozen fruit. Condiments  Avoid: Canned and packaged gravies. Worcestershire sauce. Tartar sauce. Barbecue sauce. Soy sauce. Steak sauce. Ketchup. Onion, garlic, and table salt. Meat flavorings and tenderizers.  Choose: Fresh and dried herbs and spices. Low-sodium varieties of mustard and ketchup. Lemon juice. Tabasco sauce. Horseradish. SAMPLE 2 GRAM SODIUM MEAL PLAN Breakfast / Sodium (mg)  1 cup low-fat milk / 143 mg  2 slices whole-wheat toast / 270 mg  1 tbs heart-healthy margarine / 153 mg  1 hard-boiled egg /  139 mg  1 small orange / 0 mg Lunch / Sodium (mg)  1 cup raw carrots / 76 mg   cup hummus / 298 mg  1 cup low-fat milk / 143 mg   cup red grapes / 2 mg  1 whole-wheat pita bread / 356 mg Dinner / Sodium (mg)  1 cup whole-wheat pasta / 2 mg  1 cup low-sodium tomato sauce / 73 mg  3 oz lean ground beef / 57 mg  1 small side salad (1 cup raw spinach leaves,  cup cucumber,  cup yellow bell pepper) with 1 tsp olive oil and  1 tsp red wine vinegar / 25 mg Snack / Sodium (mg)  1 container low-fat vanilla yogurt / 107 mg  3 graham cracker squares / 127 mg Nutrient Analysis  Calories: 2033  Protein: 77 g  Carbohydrate: 282 g  Fat: 72 g  Sodium: 1971 mg Document Released: 01/15/2005 Document Revised: 04/09/2011 Document Reviewed: 04/18/2009 [employer] Patient Information 2014 [place], [place].

## 2012-09-18 NOTE — Progress Notes (Signed)
Subjective:   Patient ID: Stephen [name] male   DOB: 1950-12-12 62 y.o.   MRN: [id number] Chief complaints: chronic back pain HPI: Stephen [name] is a 62 y.o. man with PMH of Claudication followed by Dr. Excell Seltzer, HTN, HLD, and chronic back pain who presents today for follow up visit.   1. HFU for CVA    Patient was just hospitalized for the evaluation of CVA between 09/08/12 and 09/10/12.  He was outside of TPA window. MRI was performed showing small acute perforator infarct involving the posterior left putamen and posterior left corona radiata w/ extensive remote small vessel ischemic injury to the infra and supratentorial brain. The neurology was consulted. He was continued on his home dose of clopidogrel, 75 mg po qd for secondary stroke prevention. His statin was changed to Lipitor 80 mg po daily. Stephen [name] continued to regain some strength to his right side and was stable upon discharge.  He will follow up with Dr. Pearlean Brownie in 2 months. There was an incidental finding of meningoma on MRI. Patient is asymptomatic. The hospital team recommended future evaluation of meningioma by neurosurgery.  Patient reports that he was unable to get the new medications including plavix, platel and Lipitor due to the cost. His [facility] Social worker is working with his insurance now, and he is scheduled to pick up his medications at his pharmacy on Friday 8/22/64meningoma   #.Health maintenance  1. Colonoscopy --Pathology report in 2008>>>Hyperplasic polyps. No adenoma found.  2. Declined Tetanus and Zostavax vaccines.    Past Medical History  Diagnosis Date  . Lumbago   . Latent tuberculosis   . Chest pain   . Intermittent claudication     bilateral  . Hyperlipidemia   . Hypertension   . Foot pain     bilateral  . Shoulder pain   . Arthropathy     shoulder  . Chronic cough   . Alcohol abuse    Current Outpatient Prescriptions  Medication Sig Dispense Refill  . amLODipine (NORVASC) 10 MG tablet Take 1  tablet (10 mg total) by mouth daily.  30 tablet  11  . hydrochlorothiazide (HYDRODIURIL) 25 MG tablet TAKE 1 TABLET BY MOUTH EVERY DAY  30 tablet  11  . pantoprazole (PROTONIX) 20 MG tablet Take 1 tablet (20 mg total) by mouth daily.  30 tablet  11  . atorvastatin (LIPITOR) 80 MG tablet Take 1 tablet (80 mg total) by mouth daily at 6 PM.  30 tablet  1  . cilostazol (PLETAL) 100 MG tablet TAKE 1 TABLET BY MOUTH TWICE A DAY  60 tablet  3  . clopidogrel (PLAVIX) 75 MG tablet TAKE 1 TABLET (75 MG TOTAL) BY MOUTH DAILY.  30 tablet  6  . potassium chloride (K-DUR) 10 MEQ tablet TAKE 1 TABLET (10 MEQ TOTAL) BY MOUTH DAILY.  30 tablet  6   No current facility-administered medications for this visit.   Family History  Problem Relation Age of Onset  . Other      He does not know his parents history  . Other      there is no specific hx of coronary arterial disease   History   Social History  . Marital Status: Legally Separated    Spouse Name: N/A    Number of Children: N/A  . Years of Education: N/A   Social History Main Topics  . Smoking status: Former Smoker    Types: Cigarettes    Quit date: 01/29/1990  .  Smokeless tobacco: None  . Alcohol Use: No     Comment: quit alcohol approximately 20 years ago  . Drug Use: 1.00 per week    Special: Marijuana     Comment: marijuana use about once a week. Former cocaine use. quit in the 1980's  . Sexual Activity: None   Other Topics Concern  . None   Social History Narrative  . None   Review of Systems: Review of Systems:  Constitutional:  Denies fever, chills, diaphoresis, appetite change and fatigue.   HEENT:  Denies congestion, sore throat, rhinorrhea, sneezing, mouth sores, trouble swallowing, neck pain   Respiratory:  Denies SOB, DOE, cough, and wheezing.   Cardiovascular:  Denies palpitations and leg swelling.   Gastrointestinal:  Denies nausea, vomiting, abdominal pain, diarrhea, constipation, blood in stool and abdominal  distention.   Genitourinary:  Denies dysuria, urgency, frequency, hematuria, flank pain and difficulty urinating.   Musculoskeletal:  Denies myalgias, back pain, joint swelling, arthralgias and gait problem.   Skin:  Denies pallor, rash and wound.   Neurological:  Denies dizziness, seizures, syncope,  light-headedness, numbness and headaches. Positive for right arm weakness.    .     Objective:  Physical Exam: Filed Vitals:   09/18/12 1619  BP: 127/78  Pulse: 81  Temp: 98.2 F (36.8 C)  TempSrc: Oral  Height: 5\' 11"  (1.803 m)  Weight: 168 lb 3.2 oz (76.295 kg)  SpO2: 98%    General: NAD Lungs: CTA B/L Heart: RRR, No M/G/R  Abdomen: soft, non-tender, normal bowel sounds, no distention, no guarding, no rebound tenderness. Msk: no joint swelling, no joint warmth, and no redness over joints.  Pulses: 1+ DP/PT pulses bilaterally Extremities: No cyanosis, clubbing, edema Neurologic: alert & oriented X3, cranial nerves II-XII intact, Right upper limb strength 4/5, right lower limb strength 3-4/5. sensation intact to light touch.     Assessment & Plan:

## 2012-09-19 DIAGNOSIS — E876 Hypokalemia: Secondary | ICD-10-CM | POA: Insufficient documentation

## 2012-09-19 MED ORDER — POTASSIUM CHLORIDE ER 10 MEQ PO TBCR: 40.0000 meq | EXTENDED_RELEASE_TABLET | Freq: Once | ORAL | Status: DC

## 2012-09-19 NOTE — Assessment & Plan Note (Addendum)
Patient is here for HFU for CVA. He states that he is doing ok. Working with [facility] PT as scheduled. He reports that he has been compliant with hs antihypertensive medications.  However, he did not fill Plavix, Platel and Lipitor due to the cost. HH SW arranged for him to pick up his medications on Friday 09/19/12.  - will ask patient to call my nurse Mamie once he receives the medication on Friday 09/19/12. - continue to follow up with the clinic - will ask my nurse to call Dr. Pearlean Brownie office to see if we could get him an appointment.  - will send for neurosurgery consult for meningoma.

## 2012-09-19 NOTE — Assessment & Plan Note (Addendum)
Patient is noted to have hypokalemia during recent hospitalization.  - will check his BMP   Addendum  Recent Labs Lab 09/18/12 1456  Stephen [name] 131*  K 3.1*  CL 88*  CO2 29  GLUCOSE 82  BUN 20  CREATININE 1.24  CALCIUM 10.4    - Will replace him with K dur 40 Meq po x 1 dose.  - I attempted to call patient a couple of times. However, he does not answer the phone and his voice box is full so I am unable to leave message.  - I will continue to try this weekend, and will send a inbasket message to the triage nurse to contact him as well.

## 2012-09-22 ENCOUNTER — Telehealth: Payer: Self-pay | Admitting: *Deleted

## 2012-09-22 NOTE — Progress Notes (Signed)
Case discussed with Dr. Li at the time of the visit.  We reviewed the resident's history and exam and pertinent patient test results.  I agree with the assessment, diagnosis, and plan of care documented in the resident's note. 

## 2012-09-22 NOTE — Progress Notes (Signed)
[facility] is charging $12.00 for potassiun med - called Rx in to [facility] pharmacy $2.32 for Rx. Talked to wife about MAP for meds. [facility] has 4 Rx that can not be filled 10/01/12. Leone Putman RN 8.25/14 10:30AM

## 2012-11-25 NOTE — Telephone Encounter (Signed)
Chart opened in error

## 2012-12-01 ENCOUNTER — Other Ambulatory Visit: Payer: Self-pay | Admitting: Internal Medicine

## 2012-12-19 ENCOUNTER — Ambulatory Visit (INDEPENDENT_AMBULATORY_CARE_PROVIDER_SITE_OTHER): Payer: Medicare Other | Admitting: Internal Medicine

## 2012-12-19 ENCOUNTER — Encounter: Payer: Self-pay | Admitting: Internal Medicine

## 2012-12-19 VITALS — BP 133/75 | HR 99 | Temp 97.6°F | Ht 71.0 in | Wt 156.3 lb

## 2012-12-19 DIAGNOSIS — Z0000 Encounter for general adult medical examination without abnormal findings: Secondary | ICD-10-CM

## 2012-12-19 DIAGNOSIS — R229 Localized swelling, mass and lump, unspecified: Secondary | ICD-10-CM

## 2012-12-19 DIAGNOSIS — E876 Hypokalemia: Secondary | ICD-10-CM

## 2012-12-19 DIAGNOSIS — I69998 Other sequelae following unspecified cerebrovascular disease: Secondary | ICD-10-CM

## 2012-12-19 DIAGNOSIS — I639 Cerebral infarction, unspecified: Secondary | ICD-10-CM

## 2012-12-19 DIAGNOSIS — I10 Essential (primary) hypertension: Secondary | ICD-10-CM

## 2012-12-19 LAB — BASIC METABOLIC PANEL WITH GFR
CO2: 28 mEq/L (ref 19–32)
Calcium: 9.9 mg/dL (ref 8.4–10.5)
Creat: 1.13 mg/dL (ref 0.50–1.35)
GFR, Est African American: 79 mL/min
GFR, Est Non African American: 68 mL/min
Sodium: 135 mEq/L (ref 135–145)

## 2012-12-19 NOTE — Assessment & Plan Note (Signed)
Patient has no history of new or progressive stroke-like symptoms. I believe his decreased sensation and strength as well as altered sensation of feeling "cold" on his RUE and RLE are 2/2 his prior stroke as these symptoms have been stable since his hospital discharge. He has normal pulses and his extremities are warm bilaterally, therefore I am not concerned for an acute process at this time.

## 2012-12-19 NOTE — Patient Instructions (Signed)
Thank you for your visit.  Today we checked you potassium level. I will call you with these results. Please check to see if you have potassium pills at home and call our office to let us know if you are taking these or not.  Your blood pressure looks good today.  We referred you to a dermatologist (skin doctor) for you skin nodule on your chin. They will call you with this appointment.  Please follow up with you PCP in 3 months or sooner if needed.

## 2012-12-19 NOTE — Assessment & Plan Note (Signed)
Good control of BP, 133/75 today.  Continue HCTZ and amlodipine.

## 2012-12-19 NOTE — Assessment & Plan Note (Signed)
Given the soft, fluctuant nature of this nodule, I believe it represents a sebaceous vs epidermoid cyst. Lipoma less likely as I would expect less fluctuance on exam. There are no signs or symptoms of infection surrounding the lesion or within the patient's mouth at this time. I am not as suspicious for malignancy given it has been present for 1 year and it is very soft, mobile and without palpable roots. Given the nodule has been rapidly enlarging and is cosmetically bothersome to patient, I think a dermatology referral is appropriate at this time.

## 2012-12-19 NOTE — Assessment & Plan Note (Signed)
Declined flu shot today.

## 2012-12-19 NOTE — Assessment & Plan Note (Signed)
Patient unclear if he has been taking Kdur daily as prescribed. I will recheck K today and notify patient of results.

## 2012-12-19 NOTE — Progress Notes (Signed)
Patient ID: Stephen [name], male   DOB: 07-09-1950, 62 y.o.   MRN: [id number] HPI The patient is a 62 y.o. male with a history of HTN, CVA (2014), GERD, claudication, HLD, chronic back pain who presents for a routine visit.  HTN: BP 133/75, which is goal for patient.   Hypokalemia: Patient was noted to be hypokalemic during his last office visit 08/2012. He is supposed to be taking Kdur daily at home, though he is unsure if he is in fact taking this. Patient has baseline weakness on RUE and RLE 2/2 previous stroke. He reports no worsening or changing muscle weakness, no muscle cramping.  CVA: Patient had CVA in August 2014 and has some residual weakness w/ mild numbness to RUE and RLE since this time. He also reports having a sensation of "coldness" to RUE and RLE that began after his stroke as well. He does not report any progression of his symptoms. He denies tingling anywhere, facial droop, slurred speech, visual changes, other weakness, dizziness.  Also denies CP, SOB, N/V/D.  R facial nodule: Patient reports having an enlarging "bump" on his R jaw for the past year. He says that he first noticed it 1 year ago and over the last 2 months it has doubled in size. The lesion is soft and does not cause any pain. No drainage. He had a similar lesion on his R arm in 2001, which he had removed by some kind of specialist. He denies tooth pain or drainage, prior dental surgery/procedure. No f/c.  ROS: General: no fevers, chills, changes in weight, changes in appetite Skin: no rash HEENT: no blurry vision, hearing changes, sore throat Pulm: no dyspnea, coughing, wheezing CV: no chest pain, palpitations, shortness of breath Abd: no abdominal pain, nausea/vomiting, diarrhea/constipation GU: no dysuria, hematuria, polyuria Ext: no arthralgias, myalgias Neuro: see HPI  Filed Vitals:   12/19/12 0935  BP: 133/75  Pulse: 99  Temp: 97.6 F (36.4 C)    PEX General: alert, cooperative, and in no  apparent distress HEENT: pupils equal round and reactive to light, vision grossly intact, oropharynx clear and non-erythematous; poor dentition, but no areas of erythema or drainage Neck: supple, no lymphadenopathy Lungs: clear to ascultation bilaterally, normal work of respiration, no wheezes, rales, ronchi Heart: regular rate and rhythm, no murmurs, gallops, or rubs Abdomen: soft, non-tender, non-distended, normal bowel sounds Extremities: warm extremities bilaterally, no BLE edema Skin: 3/3cm round flesh colored nodule over R jaw that is easily mobile and very fluctuant; no central punctum noted, but lesion is within patient's beard so it is difficult to visualize completely; no erythema, induration, or drainage Neurologic: alert & oriented X3, cranial nerves II-XII intact, strength 4+/5 RUE and RLE, strength 5/5 LUE and LLE, decreased sensation to RUE and RLE compared to L side  Current Outpatient Prescriptions on File Prior to Visit  Medication Sig Dispense Refill  . amLODipine (NORVASC) 10 MG tablet Take 1 tablet (10 mg total) by mouth daily.  30 tablet  11  . atorvastatin (LIPITOR) 80 MG tablet TAKE 1 TABLET BY MOUTH ONCE DAILY AT 6PM  30 tablet  12  . cilostazol (PLETAL) 100 MG tablet TAKE 1 TABLET BY MOUTH TWICE A DAY  60 tablet  3  . clopidogrel (PLAVIX) 75 MG tablet TAKE 1 TABLET (75 MG TOTAL) BY MOUTH DAILY.  30 tablet  6  . hydrochlorothiazide (HYDRODIURIL) 25 MG tablet TAKE 1 TABLET BY MOUTH EVERY DAY  30 tablet  11  . pantoprazole (PROTONIX)  20 MG tablet Take 1 tablet (20 mg total) by mouth daily.  30 tablet  11  . potassium chloride (K-DUR) 10 MEQ tablet TAKE 1 TABLET (10 MEQ TOTAL) BY MOUTH DAILY.  30 tablet  6  . potassium chloride (K-DUR) 10 MEQ tablet Take 4 tablets (40 mEq total) by mouth once.  4 tablet  0   No current facility-administered medications on file prior to visit.    Assessment/Plan

## 2012-12-22 ENCOUNTER — Telehealth: Payer: Self-pay | Admitting: Internal Medicine

## 2012-12-22 NOTE — Telephone Encounter (Signed)
I spoke with patient and informed him that his potassium is no longer in the low range. He is still unable to tell me if he in fact does take his potassium supplementation at home. I asked him to please bring his pill bottles to his next appointment. I also encouraged him to call the front desk to schedule a follow up appointment in 3 months (February) since he does not have one scheduled at this point. He says he will.

## 2012-12-30 NOTE — Progress Notes (Signed)
I saw and evaluated the patient.  I personally confirmed the key portions of the history and exam documented by Dr. Chikowsky and I reviewed pertinent patient test results.  The assessment, diagnosis, and plan were formulated together and I agree with the documentation in the resident's note. 

## 2012-12-31 ENCOUNTER — Other Ambulatory Visit: Payer: Self-pay | Admitting: *Deleted

## 2012-12-31 MED ORDER — CILOSTAZOL 100 MG PO TABS: ORAL_TABLET | ORAL | Status: DC

## 2013-02-13 ENCOUNTER — Other Ambulatory Visit: Payer: Self-pay | Admitting: Cardiovascular Disease

## 2013-03-12 ENCOUNTER — Ambulatory Visit (INDEPENDENT_AMBULATORY_CARE_PROVIDER_SITE_OTHER): Payer: Medicare Other | Admitting: Internal Medicine

## 2013-03-12 ENCOUNTER — Encounter: Payer: Self-pay | Admitting: Internal Medicine

## 2013-03-12 VITALS — BP 107/70 | HR 106 | Temp 97.2°F | Ht 71.0 in | Wt 155.4 lb

## 2013-03-12 DIAGNOSIS — I10 Essential (primary) hypertension: Secondary | ICD-10-CM

## 2013-03-12 DIAGNOSIS — E876 Hypokalemia: Secondary | ICD-10-CM

## 2013-03-12 DIAGNOSIS — R634 Abnormal weight loss: Secondary | ICD-10-CM

## 2013-03-12 DIAGNOSIS — I739 Peripheral vascular disease, unspecified: Secondary | ICD-10-CM

## 2013-03-12 DIAGNOSIS — R7303 Prediabetes: Secondary | ICD-10-CM

## 2013-03-12 DIAGNOSIS — Z23 Encounter for immunization: Secondary | ICD-10-CM

## 2013-03-12 DIAGNOSIS — R7309 Other abnormal glucose: Secondary | ICD-10-CM

## 2013-03-12 DIAGNOSIS — D492 Neoplasm of unspecified behavior of bone, soft tissue, and skin: Secondary | ICD-10-CM

## 2013-03-12 DIAGNOSIS — K219 Gastro-esophageal reflux disease without esophagitis: Secondary | ICD-10-CM

## 2013-03-12 LAB — BASIC METABOLIC PANEL WITH GFR
BUN: 21 mg/dL (ref 6–23)
CHLORIDE: 95 meq/L — AB (ref 96–112)
CO2: 28 meq/L (ref 19–32)
CREATININE: 1.05 mg/dL (ref 0.50–1.35)
Calcium: 10 mg/dL (ref 8.4–10.5)
GFR, Est African American: 86 mL/min
GFR, Est Non African American: 75 mL/min
GLUCOSE: 80 mg/dL (ref 70–99)
Potassium: 3.4 mEq/L — ABNORMAL LOW (ref 3.5–5.3)
Sodium: 136 mEq/L (ref 135–145)

## 2013-03-12 LAB — HEPATIC FUNCTION PANEL
ALT: 48 U/L (ref 0–53)
AST: 25 U/L (ref 0–37)
Albumin: 4.7 g/dL (ref 3.5–5.2)
Alkaline Phosphatase: 86 U/L (ref 39–117)
BILIRUBIN DIRECT: 0.1 mg/dL (ref 0.0–0.3)
BILIRUBIN TOTAL: 0.3 mg/dL (ref 0.2–1.2)
Indirect Bilirubin: 0.2 mg/dL (ref 0.2–1.2)
Total Protein: 8 g/dL (ref 6.0–8.3)

## 2013-03-12 LAB — CBC
HCT: 41.2 % (ref 39.0–52.0)
Hemoglobin: 14.2 g/dL (ref 13.0–17.0)
MCH: 29.2 pg (ref 26.0–34.0)
MCHC: 34.5 g/dL (ref 30.0–36.0)
MCV: 84.8 fL (ref 78.0–100.0)
PLATELETS: 303 10*3/uL (ref 150–400)
RBC: 4.86 MIL/uL (ref 4.22–5.81)
RDW: 16.6 % — AB (ref 11.5–15.5)
WBC: 10.8 10*3/uL — AB (ref 4.0–10.5)

## 2013-03-12 LAB — POCT GLYCOSYLATED HEMOGLOBIN (HGB A1C): Hemoglobin A1C: 5.7

## 2013-03-12 LAB — HIV ANTIBODY (ROUTINE TESTING W REFLEX): HIV: NONREACTIVE

## 2013-03-12 LAB — GLUCOSE, CAPILLARY: Glucose-Capillary: 81 mg/dL (ref 70–99)

## 2013-03-12 MED ORDER — CLOPIDOGREL BISULFATE 75 MG PO TABS: ORAL_TABLET | ORAL | Status: DC

## 2013-03-12 MED ORDER — POTASSIUM CHLORIDE ER 10 MEQ PO TBCR: EXTENDED_RELEASE_TABLET | ORAL | Status: DC

## 2013-03-12 MED ORDER — PANTOPRAZOLE SODIUM 20 MG PO TBEC: 20.0000 mg | DELAYED_RELEASE_TABLET | Freq: Every day | ORAL | Status: DC

## 2013-03-12 MED ORDER — HYDROCHLOROTHIAZIDE 25 MG PO TABS: ORAL_TABLET | ORAL | Status: DC

## 2013-03-12 MED ORDER — AMLODIPINE BESYLATE 10 MG PO TABS: 10.0000 mg | ORAL_TABLET | Freq: Every day | ORAL | Status: DC

## 2013-03-12 NOTE — Assessment & Plan Note (Signed)
Stable. Baseline claudication with ambulation. Reports medical compliance with pletal. States that he has not been taking Plavix, and could not tell me the reason.  Plan: - Instructions on medical compliance with Plavix and Pletal. - Both refills are given

## 2013-03-12 NOTE — Assessment & Plan Note (Addendum)
Assessment: Patient is noted to have a small neck mass/growth, and physical exam showed  left sided subcutaneous neck mass/growth noted, located immediately lateral to Larynx area, 1.5 x 1.5 cm, round, firm, semi mobile. Overlying skin hyperpigmentation noted. No Fluctuance or pulsatility. No erythema, swelling, warmth to touch and tenderness to palpation.  No lymphoadenopathy noted. No thyromegaly, thyroid nodule or bruit noted.    The etiology of neck growth is unclear.  Plan: - will send referral for general surgery, especially given his unintentional weight loss over last 6-7 months - patient agrees with the plan.

## 2013-03-12 NOTE — Progress Notes (Signed)
Patient: Stephen [name]   MRN: [id number]  DOB: 04/19/1950  PCP: Charlann Lange, MD   Subjective:    CC: Hypertension   HPI: Stephen [name] is a 63 y.o. man with PMH of Claudication followed by Dr. Burt Knack, HTN, HLD, CVA on Plavix and Lipitor, and chronic back pain who presents today for follow up visit.   1) intermittent claudication, stable on Pletal  2) HTN/HLD, stable, on HCTZ/Amlodipine/Lipitor  3) involuntary weight loss      Patient is noted to have 20 lbs weight loss since July 2014. He reports no changes on his appetite, food intake and activity level except for eating more "healthier and low salt food. She also report night sweats for 6-12 months. Endorses chronic mild fatigue. Denies fever, chills, headache, chronic cough, shortness breath, chest pain, nausea, vomiting, abdomen pain, constipation, diarrhea.  He admits occasional hemorrhoid bleeding and mild bright red blood noted after bowel movement.    Health maintenance - influenza vaccine--declined - zostavax vaccine--delcined - Tetanus shot--today   Review of Systems: Per HPI.   Current Outpatient Medications: Current Outpatient Prescriptions  Medication Sig Dispense Refill  . amLODipine (NORVASC) 10 MG tablet Take 1 tablet (10 mg total) by mouth daily.  30 tablet  11  . atorvastatin (LIPITOR) 80 MG tablet TAKE 1 TABLET BY MOUTH ONCE DAILY AT 6PM  30 tablet  12  . cilostazol (PLETAL) 100 MG tablet TAKE 1 TABLET BY MOUTH TWICE A DAY  60 tablet  11  . clopidogrel (PLAVIX) 75 MG tablet TAKE 1 TABLET (75 MG TOTAL) BY MOUTH DAILY.  30 tablet  12  . hydrochlorothiazide (HYDRODIURIL) 25 MG tablet TAKE 1 TABLET BY MOUTH EVERY DAY  30 tablet  11  . potassium chloride (K-DUR) 10 MEQ tablet TAKE 1 TABLET (10 MEQ TOTAL) BY MOUTH DAILY.  30 tablet  6  . pantoprazole (PROTONIX) 20 MG tablet Take 1 tablet (20 mg total) by mouth daily.  30 tablet  11   No current facility-administered medications for this visit.    Allergies: Allergies    Allergen Reactions  . Aspirin     REACTION: GI UPSET  . Lisinopril Other (See Comments)    Acute kidney insufficiency in 2013 (Cr elevated from 1.00 to 1.79    Past Medical History  Diagnosis Date  . Lumbago   . Latent tuberculosis   . Chest pain   . Intermittent claudication     bilateral  . Hyperlipidemia   . Hypertension   . Foot pain     bilateral  . Shoulder pain   . Arthropathy     shoulder  . Chronic cough   . Alcohol abuse     Objective:    Physical Exam: Filed Vitals:   03/12/13 1331  BP: 107/70  Pulse: 106  Temp: 97.2 F (36.2 C)  TempSrc: Oral  Height: 5\' 11"  (1.803 m)  Weight: 155 lb 6.4 oz (70.489 kg)  SpO2: 98%    General: Vital signs reviewed and noted. Well-developed, well-nourished, in no acute distress; alert, appropriate and cooperative throughout examination.  Neck Anterior neck: left sided subcutaneous neck mass/growth noted, located immediately lateral to Larynx area, 1.5 x 1.5 cm, round, firm, semi mobile. Overlying skin hyperpigmentation noted. No Fluctuance or pulsatility. No erythema, swelling, warmth to touch and tenderness to palpation.  No lymphoadenopathy noted. No thyromegaly, thyroid nodule or bruit noted.   Lungs:  Normal respiratory effort. Clear to auscultation BL without crackles or wheezes.  Heart: RRR. S1  and S2 normal without gallop, rubs, murmur.  Abdomen:  BS normoactive. Soft, Nondistended, non-tender.  No masses or organomegaly.  Extremities: No pretibial edema. 1+ PPP.    Assessment/ Plan:

## 2013-03-12 NOTE — Patient Instructions (Signed)
1. Will check your blood work today 2. Will send you for general surgery for skin nodule biopsy 3. Will follow up in one month.

## 2013-03-12 NOTE — Progress Notes (Signed)
INTERNAL MEDICINE TEACHING ATTENDING ADDENDUM - Dominic Pea, DO: I reviewed with the resident Dr. Nicoletta Dress, at the time of visit,  the medical history, physical examination, diagnosis and results of tests and treatment and I agree with the patient's care as documented.

## 2013-03-12 NOTE — Assessment & Plan Note (Signed)
Assessment: States he's doing fine.  A1c was 6.2 in August 2014 when he was hospitalized for stroke workup.  He was noted to have 20 pound unintentional weight loss since then.   Plan -Will recheck his A1c today

## 2013-03-12 NOTE — Assessment & Plan Note (Signed)
Assessment: Patient has a history of hypokalemia likely associated with his diuretics. He was not compliant with the potassium supplement in the past.  His K was 3.5 during last OV in Nov 2014, and he reports that he has been compliant with med since then. He is here for potassium supplement refills.   Plan - will repeat BMP - If K level is high, will stop K supplement.

## 2013-03-12 NOTE — Assessment & Plan Note (Addendum)
Assessment Patient reports unintentional weight loss of 20 pounds over last 6-7 months.  Associated symptoms include night sweats, and occasional hemorrhoid bleeding after bowel movements.   Colonoscopy in 2008 showed Sessile polyps and internal/external hemorrhoids and pathology showed as following, " COLON, SIGMOID, POLYPS X 2, BIOPSIES: TWO POLYPOID PORTIONS OF BENIGN COLONIC MUCOSA WITH FOCAL SUPERFICIAL HYPERPLASTIC FEATURES. NO ADENOMATOUS CHANGE OR MALIGNANCY IS IDENTIFIED."  It was recommended to repeat the colonoscopy in 10 years.   Repeated FOBT in 2013 were negative  The etiology of his weight loss is unclear.  Notably, he had similar weight loss in the past and gained it back without any intervention.   Plan: - Will check CBC, LFT, HIV, TSH, A1c - Will send for surgical referral for neck nodule evaluation especially in the setting of previous tobacco abuse - Will send FOBT cards x 3 for now. Will consider GI referral and repeat colonoscopy if all of his above tests are negative including above lab work and neck nodule biopsy.  - follow up in one month

## 2013-03-12 NOTE — Assessment & Plan Note (Signed)
BP Readings from Last 3 Encounters:  03/12/13 107/70  12/19/12 133/75  09/18/12 127/78    Lab Results  Component Value Date   Stephen [name] 135 12/19/2012   K 3.5 12/19/2012   CREATININE 1.13 12/19/2012    Assessment: Blood pressure control: controlled Progress toward BP goal:  at goal Well controlled HTN on current meds. Reports medical compliance.   Plan: Medications:  continue current medications Educational resources provided:   Self management tools provided:

## 2013-03-13 LAB — TSH: TSH: 1.219 u[IU]/mL (ref 0.350–4.500)

## 2013-03-26 ENCOUNTER — Ambulatory Visit (INDEPENDENT_AMBULATORY_CARE_PROVIDER_SITE_OTHER): Payer: Medicare Other | Admitting: Surgery

## 2013-04-09 ENCOUNTER — Encounter: Payer: Self-pay | Admitting: Internal Medicine

## 2013-04-09 ENCOUNTER — Ambulatory Visit (INDEPENDENT_AMBULATORY_CARE_PROVIDER_SITE_OTHER): Payer: Medicare Other | Admitting: Internal Medicine

## 2013-04-09 ENCOUNTER — Ambulatory Visit: Payer: Medicare Other | Admitting: Internal Medicine

## 2013-04-09 VITALS — BP 109/68 | HR 107 | Temp 97.4°F | Ht 71.0 in | Wt 159.0 lb

## 2013-04-09 DIAGNOSIS — R7309 Other abnormal glucose: Secondary | ICD-10-CM

## 2013-04-09 DIAGNOSIS — R7303 Prediabetes: Secondary | ICD-10-CM

## 2013-04-09 NOTE — Progress Notes (Signed)
Subjective:     Patient ID: Stephen [name], male   DOB: March 16, 1950, 63 y.o.   MRN: [id number]  HPI The patient is a 63 YO man with PMH of GERD, CVA, HTN, claudication. He is coming in today and is not clear why. He feels "fine" and is taking all of his medications. I discuss the lab results drawn at last visit with him. He denies losing any weight and on review he has gained 4 pounds since last visit. Advised him to keep visit with surgeon to evaluate lump on his neck and he states that "this visit cost me $25 and that visit they tell me will be $50 and I don't even know why I'm here". He is not having any new or additional complaints. No stroke symptoms or headaches at home.   Review of Systems  Constitutional: Negative for fever, chills, diaphoresis, activity change, appetite change, fatigue and unexpected weight change.  Respiratory: Negative for cough, chest tightness and shortness of breath.   Cardiovascular: Negative for chest pain, palpitations and leg swelling.  Gastrointestinal: Negative for nausea, vomiting, abdominal pain and diarrhea.  Musculoskeletal: Positive for arthralgias.  Neurological: Negative for dizziness, tremors, seizures, syncope, facial asymmetry, speech difficulty, weakness, light-headedness, numbness and headaches.       Objective:   Physical Exam  Constitutional: He is oriented to person, place, and time. He appears well-developed and well-nourished.  HENT:  Head: Normocephalic and atraumatic.  Eyes: EOM are normal. Pupils are equal, round, and reactive to light.  Cardiovascular: Normal rate and regular rhythm.   Pulmonary/Chest: Effort normal and breath sounds normal. No respiratory distress. He has no wheezes. He has no rales.  Abdominal: Soft. Bowel sounds are normal.  Neurological: He is alert and oriented to person, place, and time.       Assessment/Plan:    1. Follow up - Assume follow up of weight loss although unclear. Discussed normal labs with patient.  He is no longer losing weight. Advised visit with surgeon even if he needs to delay due to financial reasons. BP controlled and no changes to regimen. Return in 6 months.

## 2013-04-09 NOTE — Patient Instructions (Signed)
We are not changing your medicines today.  Come back in about 6 months with your regular doctor. Call us before then if you need refills or are sick at [phone].  Please try to bring all your medicines next time. This helps [place] take good care of you and stops mistakes from medicines that could hurt you.

## 2013-04-09 NOTE — Assessment & Plan Note (Signed)
Discussed with patient his increased risk of diabetes due to recent HgA1c of 5.7% and talked to him about diet and exercise.

## 2013-04-15 ENCOUNTER — Ambulatory Visit (INDEPENDENT_AMBULATORY_CARE_PROVIDER_SITE_OTHER): Payer: Medicare Other | Admitting: Surgery

## 2013-04-16 NOTE — Progress Notes (Signed)
Case discussed with Dr. Kollar at the time of the visit.  We reviewed the resident's history and exam and pertinent patient test results.  I agree with the assessment, diagnosis, and plan of care documented in the resident's note.     

## 2013-05-05 ENCOUNTER — Encounter (INDEPENDENT_AMBULATORY_CARE_PROVIDER_SITE_OTHER): Payer: Self-pay | Admitting: Surgery

## 2013-05-05 ENCOUNTER — Ambulatory Visit (INDEPENDENT_AMBULATORY_CARE_PROVIDER_SITE_OTHER): Payer: Medicare Other | Admitting: Surgery

## 2013-05-05 VITALS — BP 136/72 | HR 75 | Temp 97.8°F | Ht 69.0 in | Wt 158.0 lb

## 2013-05-05 DIAGNOSIS — L732 Hidradenitis suppurativa: Secondary | ICD-10-CM

## 2013-05-05 NOTE — Progress Notes (Signed)
General Surgery [facility]  Chief Complaint  Patient presents with  . New Evaluation    evaluate neck mass - referral from Dr. Charlann Lange    HISTORY: Patient is a 63 year old male referred by his primary care provider for evaluation of a left neck mass. Patient had recently been evaluated in their office for a 20 pound weight loss. On physical examination a 1.5 cm mass was noted in the left anterior neck. Concern was raised given the history of weight loss. Patient is referred to surgery for evaluation for possible excisional biopsy.  On discussion, the patient has a long-standing history of lesions on the neck bilaterally and on the face. This appears to be consistent with a history of hidradenitis. Patient states that he is using an antibacterial soap. He has not required surgical intervention for hidradenitis in the past.  Past Medical History  Diagnosis Date  . Lumbago   . Latent tuberculosis   . Chest pain   . Intermittent claudication     bilateral  . Hyperlipidemia   . Hypertension   . Foot pain     bilateral  . Shoulder pain   . Arthropathy     shoulder  . Chronic cough   . Alcohol abuse     Current Outpatient Prescriptions  Medication Sig Dispense Refill  . amLODipine (NORVASC) 10 MG tablet Take 1 tablet (10 mg total) by mouth daily.  30 tablet  11  . atorvastatin (LIPITOR) 80 MG tablet TAKE 1 TABLET BY MOUTH ONCE DAILY AT 6PM  30 tablet  12  . cilostazol (PLETAL) 100 MG tablet TAKE 1 TABLET BY MOUTH TWICE A DAY  60 tablet  11  . clopidogrel (PLAVIX) 75 MG tablet TAKE 1 TABLET (75 MG TOTAL) BY MOUTH DAILY.  30 tablet  12  . hydrochlorothiazide (HYDRODIURIL) 25 MG tablet TAKE 1 TABLET BY MOUTH EVERY DAY  30 tablet  11  . pantoprazole (PROTONIX) 20 MG tablet Take 1 tablet (20 mg total) by mouth daily.  30 tablet  11  . potassium chloride (K-DUR) 10 MEQ tablet TAKE 1 TABLET (10 MEQ TOTAL) BY MOUTH DAILY.  30 tablet  6   No current facility-administered  medications for this visit.    Allergies  Allergen Reactions  . Aspirin     REACTION: GI UPSET  . Lisinopril Other (See Comments)    Acute kidney insufficiency in 2013 (Cr elevated from 1.00 to 1.79    Family History  Problem Relation Age of Onset  . Other      there is no specific hx of coronary arterial disease/He does not know his parents history    History   Social History  . Marital Status: Legally Separated    Spouse Name: N/A    Number of Children: N/A  . Years of Education: N/A   Social History Main Topics  . Smoking status: Former Smoker    Types: Cigarettes    Quit date: 01/29/1990  . Smokeless tobacco: None  . Alcohol Use: No     Comment: quit alcohol approximately 20 years ago  . Drug Use: 1.00 per week    Special: Marijuana     Comment: marijuana use about once a week. Former cocaine use. quit in the 1980's  . Sexual Activity: None   Other Topics Concern  . None   Social History Narrative  . None    REVIEW OF SYSTEMS - PERTINENT POSITIVES ONLY: Denies involvement of the axillae. Occasional  involvement in bilateral groins. No involvement of the perineum.  EXAM: Filed Vitals:   05/05/13 1130  BP: 136/72  Pulse: 75  Temp: 97.8 F (36.6 C)    GENERAL: no acute distress HEENT: normocephalic; pupils equal and reactive; sclerae clear; dentition fair; mucous membranes moist NECK:  symmetric on extension; no palpable anterior or posterior cervical lymphadenopathy; no supraclavicular masses; no tenderness; extensive scarring and sinus tracts on the bilateral anterior neck consistent with a long-standing history of hidradenitis; palpable firm density left anterior neck measuring less than 1 cm in size consistent with epidermal inclusion cyst; no palpable thyroid nodules; no palpable lymphadenopathy; no cutaneous lesions worrisome for neoplasm CHEST: clear to auscultation bilaterally without rales, rhonchi, or wheezes CARDIAC: regular rate and rhythm  without significant murmur; peripheral pulses are full EXT:  non-tender without edema; no deformity   LABORATORY RESULTS: See [facility] (CHL-Epic) for most recent results  RADIOLOGY RESULTS: See [facility] (CHL-Epic) for most recent results  IMPRESSION: Hidradenitis suppurativa involving the neck  PLAN: On discussion, the patient notes that the nodule in the left neck has gotten significantly smaller since his visit with his primary care physician. Likewise another lesion has arisen on the right cheek but has also begun to resolve. On exam today there is no fluctuance, no drainage, and no need for acute surgical intervention.  Information regarding chronic management of hidradenitis is provided to the patient.  Patient will return for surgical care as needed.  Earnstine Regal, MD, [facility]  Primary Care Physician: Charlann Lange, MD

## 2013-05-05 NOTE — Patient Instructions (Signed)
Use Dial or Lever 2000 to bathe involved areas daily.  Use warm compresses to affected areas for inflammation.  Hidradenitis Suppurativa, Sweat Gland Abscess Hidradenitis suppurativa is a long lasting (chronic), uncommon disease of the sweat glands. With this, boil-like lumps and scarring develop in the groin, some times under the arms (axillae), and under the breasts. It may also uncommonly occur behind the ears, in the crease of the buttocks, and around the genitals.  CAUSES  The cause is from a blocking of the sweat glands. They then become infected. It may cause drainage and odor. It is not contagious. So it cannot be given to someone else. It most often shows up in puberty (about 11 to 65 years of age). But it may happen much later. It is similar to acne which is a disease of the sweat glands. This condition is slightly more common in African-Americans and women. SYMPTOMS   Hidradenitis usually starts as one or more red, tender, swellings in the groin or under the arms (axilla).  Over a period of hours to days the lesions get larger. They often open to the skin surface, draining clear to yellow-colored fluid.  The infected area heals with scarring. DIAGNOSIS  Your caregiver makes this diagnosis by looking at you. Sometimes cultures (growing germs on plates in the lab) may be taken. This is to see what germ (bacterium) is causing the infection.  TREATMENT   Topical germ killing medicine applied to the skin (antibiotics) are the treatment of choice. Antibiotics taken by mouth (systemic) are sometimes needed when the condition is getting worse or is severe.  Avoid tight-fitting clothing which traps moisture in.  Dirt does not cause hidradenitis and it is not caused by poor hygiene.  Involved areas should be cleaned daily using an antibacterial soap. Some patients find that the liquid form of Lever 2000, applied to the involved areas as a lotion after bathing, can help reduce the odor  related to this condition.  Sometimes surgery is needed to drain infected areas or remove scarred tissue. Removal of large amounts of tissue is used only in severe cases.  Birth control pills may be helpful.  Oral retinoids (vitamin A derivatives) for 6 to 12 months which are effective for acne may also help this condition.  Weight loss will improve but not cure hidradenitis. It is made worse by being overweight. But the condition is not caused by being overweight.  This condition is more common in people who have had acne.  It may become worse under stress. There is no medical cure for hidradenitis. It can be controlled, but not cured. The condition usually continues for years with periods of getting worse and getting better (remission). Document Released: 08/30/2003 Document Revised: 04/09/2011 Document Reviewed: 09/15/2007 [facility] Patient Information 2014 [place].

## 2013-07-01 ENCOUNTER — Telehealth: Payer: Self-pay | Admitting: *Deleted

## 2013-07-01 NOTE — Telephone Encounter (Signed)
[facility] called  to check if pt needs both Pletal and Plavix. Talked with Dr Lynnae January and saw notes from Dr Nicoletta Dress and  Dr Burt Knack to continue with both meds. Thomasena Edis at pharmacy aware. Hilda Blades Kenitra Leventhal RN 07/01/13 11AM

## 2013-07-16 ENCOUNTER — Telehealth: Payer: Self-pay | Admitting: *Deleted

## 2013-07-16 NOTE — Telephone Encounter (Signed)
Received faxed confirmation from [facility] that cilostazol 125m tabs 60/30 has been approved until 07-02-2015. Not aware what of what criteria was met as PA was not submitted by me.  Phone call complete.GDespina HiddenCassady6/18/20151:54 PM

## 2013-08-03 ENCOUNTER — Ambulatory Visit (INDEPENDENT_AMBULATORY_CARE_PROVIDER_SITE_OTHER): Payer: Commercial Managed Care - HMO | Admitting: Internal Medicine

## 2013-08-03 ENCOUNTER — Encounter: Payer: Self-pay | Admitting: Internal Medicine

## 2013-08-03 VITALS — BP 118/80 | HR 81 | Temp 97.0°F | Wt 155.8 lb

## 2013-08-03 DIAGNOSIS — E785 Hyperlipidemia, unspecified: Secondary | ICD-10-CM

## 2013-08-03 DIAGNOSIS — I639 Cerebral infarction, unspecified: Secondary | ICD-10-CM

## 2013-08-03 DIAGNOSIS — K219 Gastro-esophageal reflux disease without esophagitis: Secondary | ICD-10-CM

## 2013-08-03 DIAGNOSIS — F1011 Alcohol abuse, in remission: Secondary | ICD-10-CM

## 2013-08-03 DIAGNOSIS — R634 Abnormal weight loss: Secondary | ICD-10-CM

## 2013-08-03 DIAGNOSIS — R7303 Prediabetes: Secondary | ICD-10-CM

## 2013-08-03 DIAGNOSIS — R7309 Other abnormal glucose: Secondary | ICD-10-CM

## 2013-08-03 DIAGNOSIS — I10 Essential (primary) hypertension: Secondary | ICD-10-CM

## 2013-08-03 DIAGNOSIS — I6350 Cerebral infarction due to unspecified occlusion or stenosis of unspecified cerebral artery: Secondary | ICD-10-CM

## 2013-08-03 DIAGNOSIS — E876 Hypokalemia: Secondary | ICD-10-CM

## 2013-08-03 DIAGNOSIS — D509 Iron deficiency anemia, unspecified: Secondary | ICD-10-CM

## 2013-08-03 DIAGNOSIS — L732 Hidradenitis suppurativa: Secondary | ICD-10-CM

## 2013-08-03 DIAGNOSIS — I739 Peripheral vascular disease, unspecified: Secondary | ICD-10-CM

## 2013-08-03 DIAGNOSIS — D492 Neoplasm of unspecified behavior of bone, soft tissue, and skin: Secondary | ICD-10-CM

## 2013-08-03 LAB — BASIC METABOLIC PANEL WITH GFR
BUN: 15 mg/dL (ref 6–23)
CHLORIDE: 99 meq/L (ref 96–112)
CO2: 29 mEq/L (ref 19–32)
Calcium: 9.3 mg/dL (ref 8.4–10.5)
Creat: 0.95 mg/dL (ref 0.50–1.35)
GFR, EST NON AFRICAN AMERICAN: 84 mL/min
GFR, Est African American: >89 mL/min
Glucose, Bld: 82 mg/dL (ref 70–99)
POTASSIUM: 3.4 meq/L — AB (ref 3.5–5.3)
Sodium: 139 mEq/L (ref 135–145)

## 2013-08-03 LAB — LIPID PANEL
CHOL/HDL RATIO: 3.1 ratio
CHOLESTEROL: 125 mg/dL (ref 0–200)
HDL: 40 mg/dL (ref >39)
LDL Cholesterol: 66 mg/dL (ref 0–99)
Triglycerides: 93 mg/dL (ref <150)
VLDL: 19 mg/dL (ref 0–40)

## 2013-08-03 LAB — CBC
HCT: 37.7 % — ABNORMAL LOW (ref 39.0–52.0)
Hemoglobin: 13.1 g/dL (ref 13.0–17.0)
MCH: 28.9 pg (ref 26.0–34.0)
MCHC: 34.7 g/dL (ref 30.0–36.0)
MCV: 83.2 fL (ref 78.0–100.0)
PLATELETS: 274 10*3/uL (ref 150–400)
RBC: 4.53 MIL/uL (ref 4.22–5.81)
RDW: 16.4 % — AB (ref 11.5–15.5)
WBC: 9.1 10*3/uL (ref 4.0–10.5)

## 2013-08-03 LAB — POCT GLYCOSYLATED HEMOGLOBIN (HGB A1C): HEMOGLOBIN A1C: 5.9

## 2013-08-03 LAB — GLUCOSE, CAPILLARY: Glucose-Capillary: 86 mg/dL (ref 70–99)

## 2013-08-03 MED ORDER — CLINDAMYCIN PHOSPHATE 1 % EX SOLN
CUTANEOUS | Status: AC
Start: 2013-08-03 — End: 2014-08-03

## 2013-08-03 NOTE — Patient Instructions (Addendum)
Thank you for coming to clinic today [name].  General instructions: -The bumps on your neck are most consistent with hidradenitis, see below. -Continue to apply warm compresses to the bumps twice a day. -Apply clindamycin solution to the bumps and any new bumps that may develop twice a day. More information on this drug is below. -Please stop by the lab on the way out. -Schedule a routine follow up appointment in 6 months.  Clindamycin Phosphate Topical Lotion What is this medicine? CLINDAMYCIN (Gay sin) is a lincosamide antibiotic. It is used on the skin to stop the growth of certain bacteria that cause acne. This medicine may be used for other purposes; ask your health care provider or pharmacist if you have questions. COMMON BRAND NAME(S): Cleocin T, ClindaMax What should I tell my health care provider before I take this medicine? They need to know if you have any of these conditions: -diarrhea -inflammatory bowel disease -kidney or liver disease -stomach problems like colitis -an unusual or allergic reaction to clindamycin, lincomycin, other medicines, foods, dyes or preservatives -pregnant or trying to get pregnant -breast-feeding How should I use this medicine? This medicine is for external use only. Wash hands before and after use. Wash affected area and gently pat dry. Shake well before use. Apply a thin layer of this medicine to the affected area as often as prescribed by your doctor or health care professional. Do not use this medicine near the eyes, nose, or mouth. If you do get any in your eyes rinse out with plenty of cool tap water. Take all of your medicine as directed even if you think your are better. Do not skip doses or stop your medicine early. Talk to your pediatrician regarding the use of this medicine in children. Special care may be needed. While this medicine may be prescribed for children as young as 12 years for selected conditions, precautions do  apply. Overdosage: If you think you have taken too much of this medicine contact a poison control center or emergency room at once. NOTE: This medicine is only for you. Do not share this medicine with others. What if I miss a dose? If you miss a dose, use it as soon as you can. If it is almost time for your next dose, use only that dose. Do not use double or extra doses. What may interact with this medicine? -medicated cosmetics, including cover up preparations -other acne products including benzoyl peroxide, salicylic acid, or tretinoin -skin care products that have a high alcohol content (some shaving creams, lotions, or after-shave lotion) -some skin cleansers or medicated soaps This list may not describe all possible interactions. Give your health care provider a list of all the medicines, herbs, non-prescription drugs, or dietary supplements you use. Also tell them if you smoke, drink alcohol, or use illegal drugs. Some items may interact with your medicine. What should I watch for while using this medicine? Your acne should start to get better within about 6 weeks. Complete improvement may take longer. Tell your doctor or health care professional if you do not see any improvement. Your skin may get very dry and scale or peel. Let your doctor or health care professional know if this happens. Do not use any soothing cream or ointment without advice. What side effects may I notice from receiving this medicine? Side effects that you should report to your doctor or health care professional as soon as possible: -allergic reactions like skin rash, itching or hives,  swelling of the face, lips, or tongue -diarrhea that is watery or severe -pain on swallowing -stomach pain or cramps -unusual bleeding or bruising Side effects that usually do not require medical attention (report to your doctor or health care professional if they continue or are bothersome): -dry skin -nausea, vomiting This list  may not describe all possible side effects. Call your doctor for medical advice about side effects. You may report side effects to FDA at 1-800-FDA-1088. Where should I keep my medicine? Keep out of the reach of children. Store at controlled room temperature between 20 and 25 degrees C (68 and 77 degrees F). Do not freeze. This medicine is flammable. Avoid exposure to heat, fire, flame, and smoking. Throw away any unused medicine after the expiration date. NOTE: This sheet is a summary. It may not cover all possible information. If you have questions about this medicine, talk to your doctor, pharmacist, or health care provider.  2015, Elsevier/Gold Standard. (2012-08-21 16:16:30) Hidradenitis Suppurativa, Sweat Gland Abscess Hidradenitis suppurativa is a long lasting (chronic), uncommon disease of the sweat glands. With this, boil-like lumps and scarring develop in the groin, some times under the arms (axillae), and under the breasts. It may also uncommonly occur behind the ears, in the crease of the buttocks, and around the genitals.  CAUSES  The cause is from a blocking of the sweat glands. They then become infected. It may cause drainage and odor. It is not contagious. So it cannot be given to someone else. It most often shows up in puberty (about 66 to 65 years of age). But it may happen much later. It is similar to acne which is a disease of the sweat glands. This condition is slightly more common in African-Americans and women. SYMPTOMS   Hidradenitis usually starts as one or more red, tender, swellings in the groin or under the arms (axilla).  Over a period of hours to days the lesions get larger. They often open to the skin surface, draining clear to yellow-colored fluid.  The infected area heals with scarring. DIAGNOSIS  Your caregiver makes this diagnosis by looking at you. Sometimes cultures (growing germs on plates in the lab) may be taken. This is to see what germ (bacterium) is  causing the infection.  TREATMENT   Topical germ killing medicine applied to the skin (antibiotics) are the treatment of choice. Antibiotics taken by mouth (systemic) are sometimes needed when the condition is getting worse or is severe.  Avoid tight-fitting clothing which traps moisture in.  Dirt does not cause hidradenitis and it is not caused by poor hygiene.  Involved areas should be cleaned daily using an antibacterial soap. Some patients find that the liquid form of Lever 2000, applied to the involved areas as a lotion after bathing, can help reduce the odor related to this condition.  Sometimes surgery is needed to drain infected areas or remove scarred tissue. Removal of large amounts of tissue is used only in severe cases.  Birth control pills may be helpful.  Oral retinoids (vitamin A derivatives) for 6 to 12 months which are effective for acne may also help this condition.  Weight loss will improve but not cure hidradenitis. It is made worse by being overweight. But the condition is not caused by being overweight.  This condition is more common in people who have had acne.  It may become worse under stress. There is no medical cure for hidradenitis. It can be controlled, but not cured. The condition usually continues  for years with periods of getting worse and getting better (remission). Document Released: 08/30/2003 Document Revised: 04/09/2011 Document Reviewed: 09/15/2007 [facility] Patient Information 2015 [place], [place]. This information is not intended to replace advice given to you by your health care provider. Make sure you discuss any questions you have with your health care provider.

## 2013-08-03 NOTE — Assessment & Plan Note (Signed)
Stable claudication with ambulation after about 5-10 minutes.  Reports being compliant with Pletal and Plavix. -Continue Pletal and Plavix.

## 2013-08-03 NOTE — Assessment & Plan Note (Signed)
Weight stable since last visit.  CBC, HIV, A1C, TSH, LFTs negative at last visit.  Neck nodule most consistent with hidradenits. No fevers, chills, night sweats. -Continue to monitor weight.

## 2013-08-03 NOTE — Assessment & Plan Note (Signed)
History of hyperkalemia likely due to diuretic use.  Reports being compliant with potassium supplement. -BMP today. -Continue K-Dur 10 meq daily.

## 2013-08-03 NOTE — Assessment & Plan Note (Signed)
Denies current alcohol use.  He reports last drink in 1998. -Encouraged abstaining from alcohol.

## 2013-08-03 NOTE — Assessment & Plan Note (Signed)
Last LDL elevated at 114 one year ago.  Taking Lipitor 80 mg daily.  He ate breakfast this morning. -Recheck lipid panel today. -Continue current regimen.

## 2013-08-03 NOTE — Assessment & Plan Note (Deleted)
Most consistent with hidratenitis.

## 2013-08-03 NOTE — Progress Notes (Signed)
   Subjective:    Patient ID: Stephen [name], male    DOB: [birth year], 63 y.o.   MRN: [id number]  HPI Comments: Stephen [name] is a 63 year old man with a history of hypertension, CVA, intermittent claudication, and GERD presenting for routine follow up.  He continues to have some bumps on his neck that he mentioned at his last visit.  He reports that they have stayed the same size, and he has a new one on the back of his neck.  He was referred at his last appointment for a biopsy, but Dr. Harlow Asa of general surgery did not think one was needed attributing the growths to hidradenitis.  He says the growths are sometimes painful and occasionally drain white or yellow fluid that is malodorous.  He has tried putting a hot towel on the bumps, but it doesn't help.  His weight has been stable since his last visit. He denies fever, chills, or night sweats.     Review of Systems  Constitutional: Negative for fever, chills and fatigue.  HENT: Negative for congestion, hearing loss and rhinorrhea.   Eyes: Negative.  Negative for visual disturbance.  Gastrointestinal: Negative for nausea, vomiting, diarrhea and constipation.  Endocrine: Negative for cold intolerance and heat intolerance.  Genitourinary: Negative for dysuria, frequency, hematuria and difficulty urinating.  Skin: Negative.   Neurological: Negative for dizziness, syncope, light-headedness and headaches.       Objective:   Physical Exam  Constitutional: He is oriented to person, place, and time. He appears well-developed and well-nourished. No distress.  HENT:  Head: Normocephalic and atraumatic.  Mouth/Throat: No oropharyngeal exudate.  Neck: Normal range of motion. Neck supple. No tracheal deviation present. No thyromegaly present.  Firm 1 cm nodules on left anterior neck, 0.5 cm nodule on posterior right neck. Not draining, not painful.  Cardiovascular: Normal rate, regular rhythm, normal heart sounds and intact distal pulses.  Exam reveals  no gallop and no friction rub.   No murmur heard. Pulmonary/Chest: Effort normal and breath sounds normal. No respiratory distress. He has no wheezes. He has no rales. He exhibits no tenderness.  Abdominal: Soft. Bowel sounds are normal. He exhibits no distension and no mass. There is no tenderness. There is no rebound and no guarding.  Musculoskeletal: Normal range of motion. He exhibits no edema and no tenderness.  Neurological: He is alert and oriented to person, place, and time. No cranial nerve deficit. Coordination normal.  Decreased sensation in right arm compared to left.  Intention tremor in right hand.  Skin: Skin is warm and dry. No rash noted. He is not diaphoretic. No erythema.  Psychiatric: He has a normal mood and affect.          Assessment & Plan:  Please see problem-based assessment and plan.

## 2013-08-03 NOTE — Assessment & Plan Note (Signed)
Last A1C 5.7 five months ago. -Encouraged healthy eating and exercise. -Check A1C today.

## 2013-08-03 NOTE — Assessment & Plan Note (Signed)
Nodules noted at last visit and referred to surgery for biopsy.  Gen surg found nodules most consistent with hidradenitis suppurativa.  Stephen [name] reports his previous nodules have not gone away and occasionally drain a malodorous liquid.  He also reports a new nodule on the back of his neck.  He only has a few lesions and mild disease, so will initiate topical therapy. -Warm compresses BID to nodules. -Clindamycin 1% solution BID to nodules until resolved.

## 2013-08-03 NOTE — Assessment & Plan Note (Addendum)
He denies any new weakness or numbess.  He has some residual decreased sensation in his right arm and notes a tremor in his right arm since the CVA that makes it difficult to do fine motor tasks. He reports that he is able to do ADLs without difficulty.  He continues to take Plavix and cilostazol for claudication. -Continue Plavix and cilostazol. -Continue amlodipine and HCTZ to control hypertension. -Continue Lipitor.

## 2013-08-03 NOTE — Assessment & Plan Note (Signed)
He denies any chest pain or burning following eating. GERD well controlled with Protonix.  He doesn't remember the last time he had reflux symptoms. -Continue Protonix 20 mg daily.

## 2013-08-03 NOTE — Assessment & Plan Note (Signed)
BP Readings from Last 3 Encounters:  08/03/13 118/80  05/05/13 136/72  04/09/13 109/68    Lab Results  Component Value Date   NA 136 03/12/2013   K 3.4* 03/12/2013   CREATININE 1.05 03/12/2013    Assessment: Blood pressure control:   Progress toward BP goal:    Comments: Blood pressure good today on amlodipine and HCTZ.  He reports walking occasionally and using weights.  Plan: Medications:  continue current medications Educational resources provided:   Self management tools provided:   Other plans: Continue healthy eating and exercising as tolerated.

## 2013-08-03 NOTE — Progress Notes (Signed)
INTERNAL MEDICINE TEACHING ATTENDING ADDENDUM - Aldine Contes, MD: I personally saw and evaluated Stephen [name] in this clinic visit in conjunction with the resident, Dr. Trudee Kuster. I have discussed patient's plan of care with medical resident during this visit. I have confirmed the physical exam findings and have read and agree with the clinic note including the plan with the following addition: - Pt with 2 nodules- one over ant cervical region, one over post cervical area. Nodules are non tender approx 1-2 cms each, no discharge - Will prescribe clindamycin ointment and advised patient to do warm compresses bid - Will check CBC, CMP, FLP, A1C today - case d/w patient and Dr. Trudee Kuster in detail

## 2013-08-04 ENCOUNTER — Encounter: Payer: Self-pay | Admitting: Internal Medicine

## 2013-12-14 ENCOUNTER — Other Ambulatory Visit: Payer: Self-pay | Admitting: *Deleted

## 2013-12-14 DIAGNOSIS — E876 Hypokalemia: Secondary | ICD-10-CM

## 2013-12-16 MED ORDER — POTASSIUM CHLORIDE ER 10 MEQ PO TBCR: EXTENDED_RELEASE_TABLET | ORAL | Status: DC

## 2013-12-18 ENCOUNTER — Other Ambulatory Visit: Payer: Self-pay | Admitting: *Deleted

## 2013-12-18 MED ORDER — ATORVASTATIN CALCIUM 80 MG PO TABS: ORAL_TABLET | ORAL | Status: DC

## 2014-01-13 ENCOUNTER — Other Ambulatory Visit: Payer: Self-pay | Admitting: *Deleted

## 2014-01-14 MED ORDER — CILOSTAZOL 100 MG PO TABS: ORAL_TABLET | ORAL | Status: DC

## 2014-04-30 ENCOUNTER — Other Ambulatory Visit: Payer: Self-pay | Admitting: *Deleted

## 2014-04-30 DIAGNOSIS — I10 Essential (primary) hypertension: Secondary | ICD-10-CM

## 2014-05-01 MED ORDER — AMLODIPINE BESYLATE 10 MG PO TABS: 10.0000 mg | ORAL_TABLET | Freq: Every day | ORAL | Status: DC

## 2014-05-01 MED ORDER — HYDROCHLOROTHIAZIDE 25 MG PO TABS: ORAL_TABLET | ORAL | Status: DC

## 2014-05-05 ENCOUNTER — Other Ambulatory Visit: Payer: Self-pay | Admitting: *Deleted

## 2014-05-05 DIAGNOSIS — K2100 Gastro-esophageal reflux disease with esophagitis, without bleeding: Secondary | ICD-10-CM

## 2014-05-05 MED ORDER — PANTOPRAZOLE SODIUM 20 MG PO TBEC: 20.0000 mg | DELAYED_RELEASE_TABLET | Freq: Every day | ORAL | Status: DC

## 2014-05-28 ENCOUNTER — Encounter: Payer: Self-pay | Admitting: *Deleted

## 2014-07-15 ENCOUNTER — Telehealth: Payer: Self-pay | Admitting: *Deleted

## 2014-07-15 DIAGNOSIS — I739 Peripheral vascular disease, unspecified: Secondary | ICD-10-CM

## 2014-07-15 NOTE — Telephone Encounter (Signed)
[facility] needs refills on clopidogrel 75mg . Hilda Blades Tenecia Ignasiak RN 07/15/14 10AM

## 2014-07-16 MED ORDER — CLOPIDOGREL BISULFATE 75 MG PO TABS: ORAL_TABLET | ORAL | Status: DC

## 2014-07-16 NOTE — Telephone Encounter (Signed)
I refilled the Plavix.

## 2014-08-31 ENCOUNTER — Ambulatory Visit (INDEPENDENT_AMBULATORY_CARE_PROVIDER_SITE_OTHER): Payer: Commercial Managed Care - HMO | Admitting: Internal Medicine

## 2014-08-31 ENCOUNTER — Encounter: Payer: Self-pay | Admitting: Internal Medicine

## 2014-08-31 ENCOUNTER — Ambulatory Visit (HOSPITAL_COMMUNITY)
Admission: RE | Admit: 2014-08-31 | Discharge: 2014-08-31 | Disposition: A | Discharge status: Home / Self Care | Payer: Commercial Managed Care - HMO | Source: Ambulatory Visit | Attending: Internal Medicine | Admitting: Internal Medicine

## 2014-08-31 VITALS — BP 123/74 | HR 75 | Temp 97.5°F | Ht 71.0 in

## 2014-08-31 DIAGNOSIS — E876 Hypokalemia: Secondary | ICD-10-CM

## 2014-08-31 DIAGNOSIS — M25552 Pain in left hip: Secondary | ICD-10-CM

## 2014-08-31 DIAGNOSIS — G8929 Other chronic pain: Secondary | ICD-10-CM

## 2014-08-31 DIAGNOSIS — M5136 Other intervertebral disc degeneration, lumbar region: Secondary | ICD-10-CM | POA: Diagnosis not present

## 2014-08-31 DIAGNOSIS — I739 Peripheral vascular disease, unspecified: Secondary | ICD-10-CM

## 2014-08-31 DIAGNOSIS — I10 Essential (primary) hypertension: Secondary | ICD-10-CM | POA: Diagnosis not present

## 2014-08-31 DIAGNOSIS — M1612 Unilateral primary osteoarthritis, left hip: Secondary | ICD-10-CM | POA: Diagnosis not present

## 2014-08-31 DIAGNOSIS — M549 Dorsalgia, unspecified: Secondary | ICD-10-CM

## 2014-08-31 NOTE — Patient Instructions (Signed)
Thank you for your visit today.   Please return to the internal medicine clinic as needed to follow up with your primary physician.     I have made the following referrals for you: Sports Medicine for left hip and back pain.  I will check an XR of your left hip.    I will check your potassium level today.    You may take tylenol as needed for pain, but do not exceed 4000mg  per day.   Please be sure to bring all of your medications with you to every visit; this includes herbal supplements, vitamins, eye drops, and any over-the-counter medications.   Should you have any questions regarding your medications and/or any new or worsening symptoms, please be sure to call the clinic at [phone].   If you believe that you are suffering from a life threatening condition or one that may result in the loss of limb or function, then you should call 911 or proceed to the nearest Emergency Department.   A healthy lifestyle and preventative care can promote health and wellness.   Maintain regular health, dental, and eye exams.  Eat a healthy diet. Foods like vegetables, fruits, whole grains, low-fat dairy products, and lean protein foods contain the nutrients you need without too many calories. Decrease your intake of foods high in solid fats, added sugars, and salt. Get information about a proper diet from your caregiver, if necessary.  Regular physical exercise is one of the most important things you can do for your health. Most adults should get at least 150 minutes of moderate-intensity exercise (any activity that increases your heart rate and causes you to sweat) each week. In addition, most adults need muscle-strengthening exercises on 2 or more days a week.   Maintain a healthy weight. The body mass index (BMI) is a screening tool to identify possible weight problems. It provides an estimate of body fat based on height and weight. Your caregiver can help determine your BMI, and can help you  achieve or maintain a healthy weight. For adults 20 years and older:  A BMI below 18.5 is considered underweight.  A BMI of 18.5 to 24.9 is normal.  A BMI of 25 to 29.9 is considered overweight.  A BMI of 30 and above is considered obese.

## 2014-08-31 NOTE — Assessment & Plan Note (Signed)
-  recheck BMP

## 2014-08-31 NOTE — Progress Notes (Signed)
Internal Medicine Clinic Attending  Case discussed with Dr. Vanderlinden soon after the resident saw the patient.  We reviewed the resident's history and exam and pertinent patient test results.  I agree with the assessment, diagnosis, and plan of care documented in the resident's note.  

## 2014-08-31 NOTE — Assessment & Plan Note (Signed)
Well controlled. -cont current meds

## 2014-08-31 NOTE — Assessment & Plan Note (Addendum)
Pt reports left hip/back pain that has been going on for about 1 month.  Has experienced several falls stating that his legs "give out."  Has h/o several back surgeries s/p L5/S1 laminectomy years ago.   -will obtain XR of left hip -referral to [facility]

## 2014-08-31 NOTE — Addendum Note (Signed)
Addended by: Jones Bales on: 08/31/2014 12:57 PM   Modules accepted: Level of Service

## 2014-08-31 NOTE — Progress Notes (Signed)
Subjective:   Patient ID: Stephen [name] male    DOB: 1950/07/06 64 y.o.    MRN: [id number] Health Maintenance Due: Health Maintenance Due  Topic Date Due  . ZOSTAVAX  07/24/2008  . PNA vac Low Risk Adult (1 of 2 - PCV13) 07/24/2013  . INFLUENZA VACCINE  08/30/2014    _________________________________________________  HPI: Stephen [name] is a 64 y.o. male here for an acute visit.  Pt has a PMH outlined below.  Please see problem-based charting assessment and plan note for further details of medical issues addressed at today's visit.  PMH: Past Medical History  Diagnosis Date  . Lumbago   . Latent tuberculosis   . Chest pain   . Intermittent claudication     bilateral  . Hyperlipidemia   . Hypertension   . Foot pain     bilateral  . Shoulder pain   . Arthropathy     shoulder  . Chronic cough   . Alcohol abuse     Medications: Current Outpatient Prescriptions on File Prior to Visit  Medication Sig Dispense Refill  . amLODipine (NORVASC) 10 MG tablet Take 1 tablet (10 mg total) by mouth daily. 30 tablet 11  . atorvastatin (LIPITOR) 80 MG tablet TAKE 1 TABLET BY MOUTH ONCE DAILY AT 6PM 30 tablet 12  . cilostazol (PLETAL) 100 MG tablet TAKE 1 TABLET BY MOUTH TWICE A DAY 60 tablet 11  . clopidogrel (PLAVIX) 75 MG tablet TAKE 1 TABLET (75 MG TOTAL) BY MOUTH DAILY. 30 tablet 12  . hydrochlorothiazide (HYDRODIURIL) 25 MG tablet TAKE 1 TABLET BY MOUTH EVERY DAY 30 tablet 11  . pantoprazole (PROTONIX) 20 MG tablet Take 1 tablet (20 mg total) by mouth daily. 30 tablet 11  . potassium chloride (K-DUR) 10 MEQ tablet TAKE 1 TABLET (10 MEQ TOTAL) BY MOUTH DAILY. 30 tablet 6   No current facility-administered medications on file prior to visit.    Allergies: Allergies  Allergen Reactions  . Aspirin     REACTION: GI UPSET  . Lisinopril Other (See Comments)    Acute kidney insufficiency in 2013 (Cr elevated from 1.00 to 1.79    FH: Family History  Problem Relation Age of  Onset  . Other      there is no specific hx of coronary arterial disease/He does not know his parents history    SH: History   Social History  . Marital Status: Legally Separated    Spouse Name: N/A  . Number of Children: N/A  . Years of Education: N/A   Social History Main Topics  . Smoking status: Former Smoker    Types: Cigarettes    Quit date: 01/29/1990  . Smokeless tobacco: Not on file  . Alcohol Use: No     Comment: quit alcohol approximately 20 years ago  . Drug Use: 1.00 per week    Special: Marijuana     Comment: marijuana use about once a week. Former cocaine use. quit in the 1980's  . Sexual Activity: Not on file   Other Topics Concern  . None   Social History Narrative    Review of Systems: Constitutional: Negative for fever, chills and weight loss.  Eyes: Negative for blurred vision.  Respiratory: Negative for cough and shortness of breath.  Cardiovascular: Negative for chest pain, palpitations and leg swelling.  Gastrointestinal: Negative for nausea, vomiting, abdominal pain, diarrhea, constipation and blood in stool.  Genitourinary: Negative for dysuria, urgency and frequency.  Musculoskeletal: Negative for myalgias and +  back pain.  Neurological: Negative for dizziness, weakness and headaches.     Objective:   Vital Signs: Filed Vitals:   08/31/14 0846  BP: 123/74  Pulse: 75  Temp: 97.5 F (36.4 C)  TempSrc: Oral  Height: 5\' 11"  (1.803 m)  SpO2: 100%      BP Readings from Last 3 Encounters:  08/31/14 123/74  08/03/13 118/80  05/05/13 136/72    Physical Exam: Constitutional: Vital signs reviewed.  Patient is in NAD and cooperative with exam.  Head: Normocephalic and atraumatic. Eyes: EOMI, conjunctivae nl, no scleral icterus.  Neck: Supple. Cardiovascular: RRR, no MRG. Pulmonary/Chest: normal effort, CTAB, no wheezes, rales, or rhonchi. Abdominal: Soft. NT/ND +BS. Musculoskeletal: Left lumbar paraspinal tenderness.  No spinal  tenderness.  Reflexes intact.  Negative SLR b/l.    Neurological: A&O x3, cranial nerves II-XII are grossly intact, moving all extremities. Extremities: 2+DP b/l; no pitting edema. Skin: Warm, dry and intact. No rash.   Assessment & Plan:   Assessment and plan was discussed and formulated with my attending.

## 2014-09-01 LAB — BASIC METABOLIC PANEL
BUN/Creatinine Ratio: 13 (ref 10–22)
BUN: 12 mg/dL (ref 8–27)
CALCIUM: 9.7 mg/dL (ref 8.6–10.2)
CHLORIDE: 93 mmol/L — AB (ref 97–108)
CO2: 26 mmol/L (ref 18–29)
Creatinine, Ser: 0.93 mg/dL (ref 0.76–1.27)
GFR calc non Af Amer: 85 mL/min/{1.73_m2} (ref >59)
GFR, EST AFRICAN AMERICAN: 99 mL/min/{1.73_m2} (ref >59)
Glucose: 89 mg/dL (ref 65–99)
Potassium: 3.1 mmol/L — ABNORMAL LOW (ref 3.5–5.2)
Sodium: 137 mmol/L (ref 134–144)

## 2014-09-06 ENCOUNTER — Other Ambulatory Visit: Payer: Self-pay | Admitting: Internal Medicine

## 2014-09-06 DIAGNOSIS — E876 Hypokalemia: Secondary | ICD-10-CM

## 2014-09-06 MED ORDER — POTASSIUM CHLORIDE CRYS ER 20 MEQ PO TBCR: 20.0000 meq | EXTENDED_RELEASE_TABLET | Freq: Every day | ORAL | Status: DC

## 2014-09-06 NOTE — Progress Notes (Signed)
Pt aware Dr Gordy Levan note.

## 2014-09-15 ENCOUNTER — Ambulatory Visit (INDEPENDENT_AMBULATORY_CARE_PROVIDER_SITE_OTHER): Payer: Commercial Managed Care - HMO | Admitting: Sports Medicine

## 2014-09-15 ENCOUNTER — Encounter: Payer: Self-pay | Admitting: Sports Medicine

## 2014-09-15 VITALS — BP 142/84 | Ht 71.0 in | Wt 165.0 lb

## 2014-09-15 DIAGNOSIS — M5136 Other intervertebral disc degeneration, lumbar region: Secondary | ICD-10-CM

## 2014-09-15 DIAGNOSIS — G8929 Other chronic pain: Secondary | ICD-10-CM | POA: Diagnosis not present

## 2014-09-15 DIAGNOSIS — M549 Dorsalgia, unspecified: Secondary | ICD-10-CM | POA: Diagnosis not present

## 2014-09-15 MED ORDER — METHYLPREDNISOLONE ACETATE 80 MG/ML IJ SUSP
80.0000 mg | Freq: Once | INTRAMUSCULAR | Status: AC
  Administered 2014-09-15: 80 mg via INTRAMUSCULAR

## 2014-09-15 NOTE — Progress Notes (Signed)
   Subjective:    Patient ID: Stephen [name], male    DOB: 1950-08-01, 64 y.o.   MRN: [id number]  HPI chief complaint: Left hip pain  Very pleasant 64 year old male comes in today complaining of 2 months of posterior left hip pain. His pain started with no known trauma but he has recently fallen due to the fact that his left leg recently gave way. He has a history of 3 prior low back surgeries. These were all done many years ago. He denies any lateral hip pain. He denies any groin pain. His pain is worse with standing and walking and improves some at rest. Pain at times will radiate down the left leg but he denies any numbness or tingling. He has had x-rays of both his lumbar spine and his left hip done recently. Today he states that he does not have any pain. He is here today with his daughter.  Past medical history is reviewed. Significant for a prior CVA. Also significant for peripheral vascular disease and chronic low back pain Medications are reviewed Allergies are reviewed    Review of Systems     Objective:   Physical Exam Somewhat cachectic appearing. No acute distress. Sitting comfortably in the exam room.  Lumbar spine: Limited range of motion. Pain with extension. There is no palpable tenderness to palpation along the lumbar midline nor along the paraspinal musculature.  Left hip: Patient does have limited passive internal rotation. Range of motion is 0-30 of internal rotation. Good passive external rotation. This is similar to his uninvolved right hip. No pain with log roll.  He has noticeable atrophy of both lower extremities particularly in his quadriceps muscles, left greater than right. General weakness as well but nothing focal. Reflexes are equal at the Achilles and patellar tendons. Negative straight leg raise. He ambulates with the assistance of a cane.  X-rays of his lumbar spine are reviewed. He has moderately advanced disc space narrowing at L4-L5 and L5-S1. He also has  facet changes at L5-S1. Nothing acute is seen. X-rays of his left hip are also reviewed. He has mild degenerative changes but nothing acute.       Assessment & Plan:  Posterior left hip pain likely secondary to lumbar degenerative disc disease versus facet arthropathy Status post multiple prior lumbar spine surgeries Prior CVA  I do not think the patient's pain is originating from his left hip. Although he does have some mild degenerative changes in his hip his lack of groin pain does not support this as his pain generator. At this point it is unsure whether his symptoms are originating from his degenerative disc disease or his facet arthropathy. I recommended that we start with a simple IM 80 mg Depo-Medrol injection and send him to physical therapy for some generalized lower extremity strengthening. He has suffered a prior CVA and this may make it somewhat challenging but I think we need to try to work on regaining some strength. He will follow-up with me in 3 weeks for reevaluation. If symptoms persist we did discuss referral to [facility] imaging for either an epidural injection or facet injection. If that is the case I am hoping to be able to avoid a preprocedural MRI.

## 2014-09-22 ENCOUNTER — Ambulatory Visit: Payer: Commercial Managed Care - HMO

## 2014-09-27 ENCOUNTER — Ambulatory Visit: Payer: Commercial Managed Care - HMO | Attending: Student | Admitting: Physical Therapy

## 2014-09-27 DIAGNOSIS — R29898 Other symptoms and signs involving the musculoskeletal system: Secondary | ICD-10-CM | POA: Diagnosis not present

## 2014-09-27 DIAGNOSIS — M2560 Stiffness of unspecified joint, not elsewhere classified: Secondary | ICD-10-CM | POA: Insufficient documentation

## 2014-09-27 DIAGNOSIS — M545 Low back pain, unspecified: Secondary | ICD-10-CM

## 2014-09-27 DIAGNOSIS — R293 Abnormal posture: Secondary | ICD-10-CM | POA: Diagnosis not present

## 2014-09-27 DIAGNOSIS — R2681 Unsteadiness on feet: Secondary | ICD-10-CM

## 2014-09-27 DIAGNOSIS — M5386 Other specified dorsopathies, lumbar region: Secondary | ICD-10-CM

## 2014-09-27 NOTE — Patient Instructions (Signed)
   Kynsley Whitehouse PT, DPT, LAT, ATC  [facility] Phone: [phone]     

## 2014-09-27 NOTE — Therapy (Signed)
[place], [place], [address] Phone: [phone]   Fax:  [phone]  Physical Therapy Evaluation  Patient Details  Name: Stephen [name] MRN: [id number] Date of Birth: Sep 08, 1950 Referring Provider:  Zada Finders, MD  Encounter Date: 09/27/2014      PT End of Session - 09/27/14 1743    Visit Number 1   Number of Visits 16   Date for PT Re-Evaluation 11/22/14   Authorization Type Medicare: KX modifier by 15th visit, Progress note by 8th or 9th.    PT Start Time 1545   PT Stop Time 1630   PT Time Calculation (min) 45 min   Activity Tolerance Patient tolerated treatment well   Behavior During Therapy WFL for tasks assessed/performed      Past Medical History  Diagnosis Date  . Lumbago   . Latent tuberculosis   . Chest pain   . Intermittent claudication     bilateral  . Hyperlipidemia   . Hypertension   . Foot pain     bilateral  . Shoulder pain   . Arthropathy     shoulder  . Chronic cough   . Alcohol abuse     Past Surgical History  Procedure Laterality Date  . Laminectomy  [id number]    left lumbar   . Appendectomy    . Hammer toe surgery      right 2nd. and 5th    There were no vitals filed for this visit.  Visit Diagnosis:  Bilateral low back pain without sciatica - Plan: PT plan of care cert/re-cert  Weakness of both legs - Plan: PT plan of care cert/re-cert  Decreased ROM of lumbar spine - Plan: PT plan of care cert/re-cert  Unsteadiness - Plan: PT plan of care cert/re-cert  Abnormal posture - Plan: PT plan of care cert/re-cert      Subjective Assessment - 09/27/14 1555    Subjective pt is a 64 y.o M with CC low back pain with bil le weakness.  He reports that in the last month it has gotten better.    Patient is accompained by: Family member  daughter   Limitations Sitting;Lifting;Standing;Walking;House hold activities   How long can you sit comfortably? 15-20 min   How  long can you stand comfortably? 10-15 min   How long can you walk comfortably? 20 min   Diagnostic tests 08/31/2014 multilevel degenerative disc disease and facet joint   Patient Stated Goals to be stronger to be safer (per pt's daughter)   Currently in Pain? Yes   Pain Score 4    Pain Location Back   Pain Orientation Left;Right;Mid;Lower   Pain Descriptors / Indicators Aching;Dull;Sore   Pain Type Chronic pain   Pain Radiating Towards intemrittent pain down the L leg   Pain Onset More than a month ago   Pain Frequency Intermittent   Aggravating Factors  any trunk movement   Pain Relieving Factors pain medication, heat            [facility] PT Assessment - 09/27/14 1603    Assessment   Medical Diagnosis low back pain with bil LE weakness   Onset Date/Surgical Date --  early 2000's   Hand Dominance Right   Next MD Visit 10/06/2014   Prior Therapy yes   Precautions   Precautions None   Restrictions   Weight Bearing Restrictions No   Balance Screen   Has the patient fallen in the past 6 months Yes  How many times? 4   Has the patient had a decrease in activity level because of a fear of falling?  No   Is the patient reluctant to leave their home because of a fear of falling?  Yes   [place] residence   Living Arrangements Alone   Type of [place] to enter   Entrance Stairs-Number of Steps 6   Entrance Stairs-Rails Can reach both   [place] One level   [place] - 2 wheels;Cane - single point   Prior Function   Level of Independence Independent;Independent with basic ADLs   Vocation On disability   Leisure go sit on a river bank all day   Observation/Other Assessments   Focus on Therapeutic Outcomes (FOTO)  63% limited  predicted 48% limited   Posture/Postural Control   Posture/Postural Control Postural limitations   Postural Limitations Rounded Shoulders;Forward head;Decreased lumbar lordosis    ROM / Strength   AROM / PROM / Strength AROM;Strength   AROM   AROM Assessment Site Lumbar   Lumbar Flexion 32   Lumbar Extension 10   Lumbar - Right Side Bend 20   Lumbar - Left Side Bend 12   Strength   Strength Assessment Site Hip;Knee   Right/Left Hip Right;Left   Right Hip Flexion 4/5   Right Hip Extension 4-/5   Right Hip ABduction 4-/5   Right Hip ADduction 4-/5   Left Hip Flexion 4/5   Left Hip Extension 4-/5   Left Hip ABduction 4-/5   Left Hip ADduction 4-/5   Right/Left Knee Right;Left   Right Knee Flexion 4-/5   Right Knee Extension 4-/5   Left Knee Flexion 4-/5   Left Knee Extension 4-/5   Flexibility   Soft Tissue Assessment /Muscle Length yes   Hamstrings L 30, R 30    Palpation   Palpation comment tenderness located int he low back    Special Tests    Special Tests Lumbar   Lumbar Tests Slump Test;Prone Knee Bend Test;Straight Leg Raise   Slump test   Findings Negative   Prone Knee Bend Test   Findings Negative   Straight Leg Raise   Findings Negative   Ambulation/Gait   Gait Pattern Step-through pattern;Decreased stride length;Wide base of support;Antalgic;Poor foot clearance - left;Poor foot clearance - right                           PT Education - 09/27/14 1743    Education provided Yes   Education Details evaluation findings, POC, goals, HEP, anatomy education   Person(s) Educated Patient   Methods Explanation   Comprehension Verbalized understanding          PT Short Term Goals - 09/27/14 1750    PT SHORT TERM GOAL #1   Title pt will be I with initial HEP (10/28/2014)   Time 4   Period Weeks   Status New   PT SHORT TERM GOAL #2   Title He will demonstratre increase of trunk mobility by > 8 degrees to help promote functional progression (10/28/2014)   Time 4   Period Weeks   Status New   PT SHORT TERM GOAL #3   Title pt will be able to maintain rhomberg balance for >15 seconds with minimal postural sway to promote  balance (10/28/2014)   Time 4   Period Weeks   Status New  PT SHORT TERM GOAL #4   Title pt will be able to stand > 10-20 min with < 3/10 pain to assist with exercise progression (10/28/2014)   Time 4   Period Weeks   Status New           PT Long Term Goals - Oct 08, 2014 1753    PT LONG TERM GOAL #1   Title At discharge pt will be I with all HEP given throughout therapy (11/22/14)   Time 8   Period Weeks   Status New   PT LONG TERM GOAL #2   Title He will be able to stand/walk for > 30 min with < 2/10 pain to assist with prolonged community ambulation (11/22/14)   Time 8   Period Weeks   Status New   PT LONG TERM GOAL #3   Title He will increase bil LE strength to > 4/5 to assist with endurance and safety during gait (11/22/14)   Time 8   Period Weeks   Status New   PT LONG TERM GOAL #4   Title pt will be able to maintain tandem stance bil for > 20 sec with minimal postural sway to assist with improved balance and promote safety (11/22/14)   Time 8   Period Weeks   Status New   PT LONG TERM GOAL #5   Title pt will I his FOTO score to at least 52 to demonstrate improved funcitonal capability at discharge (11/22/14)   Time [place] - 08-Oct-2014 1744    Clinical Impression Statement Stephen [name] presents to OPPT with CC of low back pain and bil LE weakness. He demosntrates limited trunk mobility in all planes secondary to pain and muscle tightness. Bil Le demonstrate mild weakness with increased weakness in  bil knees. He exhbits an antalgic gait pattern, with sustained abduction, and limited step length bil with moderate postural sway. Pt reports hx or falls x 3 in the last 6 months, and that he just fells weak and unsteady. He would benfit from skilled physical therapy to maxmize his function by addressing the impairments listed.    Pt will benefit from skilled therapeutic intervention in order to improve on the following deficits  Decreased activity tolerance;Decreased balance;Decreased endurance;Difficulty walking;Decreased range of motion;Decreased strength;Decreased mobility;Hypomobility;Impaired flexibility;Pain;Improper body mechanics;Postural dysfunction   Rehab Potential Good   PT Frequency 2x / week   PT Duration 8 weeks   PT Treatment/Interventions ADLs/Self Care Home Management;Cryotherapy;Electrical Stimulation;Iontophoresis 4mg /ml Dexamethasone;Moist Heat;Ultrasound;Stair training;Gait training;Therapeutic activities;Therapeutic exercise;Balance training;Neuromuscular re-education;Patient/family education;Manual techniques;Passive range of motion;Dry needling;Taping   PT Next Visit Plan assess response to HEP, Berg assessment, bil le strengthening, posture education.    PT Home Exercise Plan standing hip ext/abd, lower trunk rotation, sit to stand   Consulted and Agree with Plan of Care Patient;Family member/caregiver   Family Member Consulted daughter          G-Codes - 2014/10/08 1758    Functional Assessment Tool Used FOTO 62% limited   Functional Limitation Mobility: Walking and moving around   Mobility: Walking and Moving Around Current Status [phone]) At least 60 percent but less than 80 percent impaired, limited or restricted   Mobility: Walking and Moving Around Goal Status [phone]) At least 20 percent but less than 40 percent impaired, limited or restricted       Problem List Patient Active Problem List   Diagnosis  Date Noted  . Left hip pain 08/31/2014  . Hidradenitis suppurativa, neck 05/05/2013  . Pre-diabetes 03/12/2013  . Loss of weight, involuntary 03/12/2013  . Hypokalemia 09/19/2012  . CVA (cerebrovascular accident) 09/08/2012  . GERD (gastroesophageal reflux disease) 01/28/2012  . Iron deficiency anemia 06/21/2011  . Chronic back pain s/p L5-S1 laminectomy 05/09/2011  . Tremor 07/31/2010  . ADHESIVE CAPSULITIS, LEFT 07/05/2009  . Alcohol abuse, in remission 05/02/2009  .  Peripheral vascular disease 12/10/2006  . HYPERLIPIDEMIA 06/12/2006  . Essential hypertension 06/12/2006   Starr Lake PT, DPT, LAT, ATC  09/27/2014  6:02 PM      [facility] [address] [place], [place], [address] Phone: [phone]   Fax:  [phone]

## 2014-10-06 ENCOUNTER — Ambulatory Visit: Payer: Commercial Managed Care - HMO | Admitting: Sports Medicine

## 2014-10-12 ENCOUNTER — Ambulatory Visit: Payer: Commercial Managed Care - HMO | Attending: Student | Admitting: Physical Therapy

## 2014-10-12 DIAGNOSIS — M2560 Stiffness of unspecified joint, not elsewhere classified: Secondary | ICD-10-CM | POA: Diagnosis not present

## 2014-10-12 DIAGNOSIS — R293 Abnormal posture: Secondary | ICD-10-CM | POA: Insufficient documentation

## 2014-10-12 DIAGNOSIS — R29898 Other symptoms and signs involving the musculoskeletal system: Secondary | ICD-10-CM | POA: Insufficient documentation

## 2014-10-12 DIAGNOSIS — R2681 Unsteadiness on feet: Secondary | ICD-10-CM | POA: Diagnosis not present

## 2014-10-12 DIAGNOSIS — M5386 Other specified dorsopathies, lumbar region: Secondary | ICD-10-CM

## 2014-10-12 DIAGNOSIS — M545 Low back pain, unspecified: Secondary | ICD-10-CM

## 2014-10-12 NOTE — Therapy (Signed)
[place], [place], [address] Phone: [phone]   Fax:  [phone]  Physical Therapy Treatment  Patient Details  Name: Stephen [name] MRN: [id number] Date of Birth: 06-23-1950 Referring Provider:  Zada Finders, MD  Encounter Date: 10/12/2014      PT End of Session - 10/12/14 0928    Visit Number 2   Number of Visits 16   Date for PT Re-Evaluation 11/22/14   Authorization Type Medicare: KX modifier by 15th visit, Progress note by 8th or 9th.    PT Start Time 0845   PT Stop Time 0930   PT Time Calculation (min) 45 min   Activity Tolerance Patient tolerated treatment well;Patient limited by fatigue   Behavior During Therapy [facility] for tasks assessed/performed      Past Medical History  Diagnosis Date  . Lumbago   . Latent tuberculosis   . Chest pain   . Intermittent claudication     bilateral  . Hyperlipidemia   . Hypertension   . Foot pain     bilateral  . Shoulder pain   . Arthropathy     shoulder  . Chronic cough   . Alcohol abuse     Past Surgical History  Procedure Laterality Date  . Laminectomy  [id number]    left lumbar   . Appendectomy    . Hammer toe surgery      right 2nd. and 5th    There were no vitals filed for this visit.  Visit Diagnosis:  Bilateral low back pain without sciatica  Weakness of both legs  Decreased ROM of lumbar spine  Unsteadiness  Abnormal posture      Subjective Assessment - 10/12/14 0852    Subjective "The back has gotten a whole lot better since the last visit"    Currently in Pain? No/denies   Pain Score 0-No pain   Pain Location Back   Pain Orientation Left;Right;Mid;Lower   Pain Onset More than a month ago   Pain Frequency Intermittent            [facility] PT Assessment - 10/12/14 0001    Berg Balance Test   Sit to Stand Able to stand  independently using hands   Standing Unsupported Able to stand 2 minutes with supervision    Sitting with Back Unsupported but Feet Supported on Floor or Stool Able to sit safely and securely 2 minutes   Stand to Sit Uses backs of legs against chair to control descent   Transfers Able to transfer safely, definite need of hands   Standing Unsupported with Eyes Closed Able to stand 10 seconds with supervision   Standing Ubsupported with Feet Together Able to place feet together independently and stand 1 minute safely   From Standing, Reach Forward with Outstretched Arm Can reach forward >12 cm safely (5")   From Standing Position, Pick up Object from Floor Unable to pick up shoe, but reaches 2-5 cm (1-2") from shoe and balances independently   From Standing Position, Turn to Look Behind Over each Shoulder Looks behind one side only/other side shows less weight shift   Turn 360 Degrees Able to turn 360 degrees safely but slowly   Standing Unsupported, Alternately Place Feet on Step/Stool Able to complete >2 steps/needs minimal assist   Standing Unsupported, One Foot in Front Able to take small step independently and hold 30 seconds   Standing on One Leg Tries to lift leg/unable to hold 3 seconds but remains  standing independently   Total Score 36                     [facility] Adult PT Treatment/Exercise - 10/12/14 0854    Lumbar Exercises: Stretches   Passive Hamstring Stretch 2 reps;30 seconds  bil   Lower Trunk Rotation 5 reps;10 seconds   Pelvic Tilt 2 reps;30 seconds   Lumbar Exercises: Aerobic   Stationary Bike NuStep L5 x 6 min   Lumbar Exercises: Standing   Other Standing Lumbar Exercises hip abduction/ extension 2 x 10 ea.  with HHA on table for stability   Lumbar Exercises: Seated   Sit to Stand 10 reps  VC to avoid using hands, and knees on table                PT Education - 10/12/14 0928    Education provided Yes   Education Details HEP Review   Person(s) Educated Patient   Methods Explanation   Comprehension Verbalized understanding           PT Short Term Goals - 09/27/14 1750    PT SHORT TERM GOAL #1   Title pt will be I with initial HEP (10/28/2014)   Time 4   Period Weeks   Status New   PT SHORT TERM GOAL #2   Title He will demonstratre increase of trunk mobility by > 8 degrees to help promote functional progression (10/28/2014)   Time 4   Period Weeks   Status New   PT SHORT TERM GOAL #3   Title pt will be able to maintain rhomberg balance for >15 seconds with minimal postural sway to promote balance (10/28/2014)   Time 4   Period Weeks   Status New   PT SHORT TERM GOAL #4   Title pt will be able to stand > 10-20 min with < 3/10 pain to assist with exercise progression (10/28/2014)   Time 4   Period Weeks   Status New           PT Long Term Goals - 09/27/14 1753    PT LONG TERM GOAL #1   Title At discharge pt will be I with all HEP given throughout therapy (11/22/14)   Time 8   Period Weeks   Status New   PT LONG TERM GOAL #2   Title He will be able to stand/walk for > 30 min with < 2/10 pain to assist with prolonged community ambulation (11/22/14)   Time 8   Period Weeks   Status New   PT LONG TERM GOAL #3   Title He will increase bil LE strength to > 4/5 to assist with endurance and safety during gait (11/22/14)   Time 8   Period Weeks   Status New   PT LONG TERM GOAL #4   Title pt will be able to maintain tandem stance bil for > 20 sec with minimal postural sway to assist with improved balance and promote safety (11/22/14)   Time 8   Period Weeks   Status New   PT LONG TERM GOAL #5   Title pt will I his FOTO score to at least 52 to demonstrate improved funcitonal capability at discharge (11/22/14)   Time 8   Period Weeks   Status New               Plan - 10/12/14 0929    Clinical Impression Statement Stephen [name] reports he has been doing some of the HEP but  not all of them. Reviewed HEP today and he reported understanding and demonstrated that he could perform them correctly. He scored a  36/56 on the BERG indicating a high fall risk. Plan to cntinue with strengthening and balance to help increase safety with standing and walking activities.    PT Next Visit Plan bil LE strengthening, posture education, balance training   PT Home Exercise Plan HEP review        Problem List Patient Active Problem List   Diagnosis Date Noted  . Left hip pain 08/31/2014  . Hidradenitis suppurativa, neck 05/05/2013  . Pre-diabetes 03/12/2013  . Loss of weight, involuntary 03/12/2013  . Hypokalemia 09/19/2012  . CVA (cerebrovascular accident) 09/08/2012  . GERD (gastroesophageal reflux disease) 01/28/2012  . Iron deficiency anemia 06/21/2011  . Chronic back pain s/p L5-S1 laminectomy 05/09/2011  . Tremor 07/31/2010  . ADHESIVE CAPSULITIS, LEFT 07/05/2009  . Alcohol abuse, in remission 05/02/2009  . Peripheral vascular disease 12/10/2006  . HYPERLIPIDEMIA 06/12/2006  . Essential hypertension 06/12/2006   Starr Lake PT, DPT, LAT, ATC  10/12/2014  9:31 AM      [facility] [address] [place], [place], [address] Phone: [phone]   Fax:  [phone]

## 2014-10-14 ENCOUNTER — Ambulatory Visit: Payer: Commercial Managed Care - HMO | Admitting: Physical Therapy

## 2014-10-14 DIAGNOSIS — M545 Low back pain: Secondary | ICD-10-CM | POA: Diagnosis not present

## 2014-10-14 DIAGNOSIS — R2681 Unsteadiness on feet: Secondary | ICD-10-CM

## 2014-10-14 DIAGNOSIS — R29898 Other symptoms and signs involving the musculoskeletal system: Secondary | ICD-10-CM

## 2014-10-14 DIAGNOSIS — R293 Abnormal posture: Secondary | ICD-10-CM

## 2014-10-14 DIAGNOSIS — M5386 Other specified dorsopathies, lumbar region: Secondary | ICD-10-CM

## 2014-10-14 DIAGNOSIS — M2560 Stiffness of unspecified joint, not elsewhere classified: Secondary | ICD-10-CM | POA: Diagnosis not present

## 2014-10-14 NOTE — Therapy (Signed)
[place], [place], [address] Phone: [phone]   Fax:  [phone]  Physical Therapy Treatment  Patient Details  Name: Stephen [name] MRN: [id number] Date of Birth: 02/12/1950 Referring Provider:  Zada Finders, MD  Encounter Date: 10/14/2014  Visit number:3 Number of visits:  16 Date of re-eval:11/22/14 PT Start time:0805 PT Stop [id number]      PT End of Session - 10/14/14 1626    Activity Tolerance Patient tolerated treatment well;No increased pain;Patient limited by fatigue   Behavior During Therapy [facility] for tasks assessed/performed      Past Medical History  Diagnosis Date  . Lumbago   . Latent tuberculosis   . Chest pain   . Intermittent claudication     bilateral  . Hyperlipidemia   . Hypertension   . Foot pain     bilateral  . Shoulder pain   . Arthropathy     shoulder  . Chronic cough   . Alcohol abuse     Past Surgical History  Procedure Laterality Date  . Laminectomy  [id number]    left lumbar   . Appendectomy    . Hammer toe surgery      right 2nd. and 5th    There were no vitals filed for this visit.  Visit Diagnosis:  Weakness of both legs  Decreased ROM of lumbar spine  Unsteadiness  Abnormal posture      Subjective Assessment - 10/14/14 0815    Subjective No pain now.  No questions   Patient is accompained by: Family member  daughter   Currently in Pain? No/denies                         [facility] Adult PT Treatment/Exercise - 10/14/14 0818    Lumbar Exercises: Stretches   Pelvic Tilt --  Multiple attempts.   Pelvic Tilt Limitations unable to really move pelvis   Lumbar Exercises: Aerobic   Stationary Bike Nustep 6 minutes L 5 333 Steps, legs only   Lumbar Exercises: Seated   Sit to Stand Limitations hip "walking" hips forward and back   also posture work sitting.  colse SBA   Lumbar Exercises: Supine   Bent Knee Raise 5 reps  cues    Bridge 10 reps  cues.   Lumbar Exercises: Sidelying   Clam Limitations 5 reps each   Other Sidelying Lumbar Exercises Book opener.    Other posture exercises sitting with arm movements to encourage neutral spine.              PT Short Term Goals - 10/14/14 1628    PT SHORT TERM GOAL #1   Title pt will be I with initial HEP (10/28/2014)   Time 4   Period Weeks   Status On-going   PT SHORT TERM GOAL #2   Title He will demonstratre increase of trunk mobility by > 8 degrees to help promote functional progression (10/28/2014)   Time 4   Period Weeks   Status Unable to assess   PT SHORT TERM GOAL #3   Title pt will be able to maintain rhomberg balance for >15 seconds with minimal postural sway to promote balance (10/28/2014)   Time 4   Status On-going   PT SHORT TERM GOAL #4   Title pt will be able to stand > 10-20 min with < 3/10 pain to assist with exercise progression (10/28/2014)   Time 4   Period Weeks  Status On-going           PT Long Term Goals - 09/27/14 1753    PT LONG TERM GOAL #1   Title At discharge pt will be I with all HEP given throughout therapy (11/22/14)   Time 8   Period Weeks   Status New   PT LONG TERM GOAL #2   Title He will be able to stand/walk for > 30 min with < 2/10 pain to assist with prolonged community ambulation (11/22/14)   Time 8   Period Weeks   Status New   PT LONG TERM GOAL #3   Title He will increase bil LE strength to > 4/5 to assist with endurance and safety during gait (11/22/14)   Time 8   Period Weeks   Status New   PT LONG TERM GOAL #4   Title pt will be able to maintain tandem stance bil for > 20 sec with minimal postural sway to assist with improved balance and promote safety (11/22/14)   Time 8   Period Weeks   Status New   PT LONG TERM GOAL #5   Title pt will I his FOTO score to at least 52 to demonstrate improved funcitonal capability at discharge (11/22/14)   Time 8   Period Weeks   Status New                Plan - 10/14/14 1626    Clinical Impression Statement continue to work on strengthening.     PT Next Visit Plan bil LE strengthening, posture education, balance training.  Try parallel bars for balance with gait belt.   Consulted and Agree with Plan of Care Patient   Family Member Consulted daugther        Problem List Patient Active Problem List   Diagnosis Date Noted  . Left hip pain 08/31/2014  . Hidradenitis suppurativa, neck 05/05/2013  . Pre-diabetes 03/12/2013  . Loss of weight, involuntary 03/12/2013  . Hypokalemia 09/19/2012  . CVA (cerebrovascular accident) 09/08/2012  . GERD (gastroesophageal reflux disease) 01/28/2012  . Iron deficiency anemia 06/21/2011  . Chronic back pain s/p L5-S1 laminectomy 05/09/2011  . Tremor 07/31/2010  . ADHESIVE CAPSULITIS, LEFT 07/05/2009  . Alcohol abuse, in remission 05/02/2009  . Peripheral vascular disease 12/10/2006  . HYPERLIPIDEMIA 06/12/2006  . Essential hypertension 06/12/2006    Stephen [name] 10/14/2014, 5:18 PM  Stephen [name] [address] [place], [place], [address] Phone: [phone]   Fax:  [phone]     Stephen [name], PTA 10/14/2014 5:18 PM Phone: [phone] Fax: [phone]

## 2014-10-19 ENCOUNTER — Ambulatory Visit: Payer: Commercial Managed Care - HMO | Admitting: Physical Therapy

## 2014-10-19 DIAGNOSIS — R293 Abnormal posture: Secondary | ICD-10-CM

## 2014-10-19 DIAGNOSIS — M5386 Other specified dorsopathies, lumbar region: Secondary | ICD-10-CM

## 2014-10-19 DIAGNOSIS — R29898 Other symptoms and signs involving the musculoskeletal system: Secondary | ICD-10-CM

## 2014-10-19 DIAGNOSIS — M545 Low back pain, unspecified: Secondary | ICD-10-CM

## 2014-10-19 DIAGNOSIS — R2681 Unsteadiness on feet: Secondary | ICD-10-CM | POA: Diagnosis not present

## 2014-10-19 DIAGNOSIS — M2560 Stiffness of unspecified joint, not elsewhere classified: Secondary | ICD-10-CM | POA: Diagnosis not present

## 2014-10-19 NOTE — Therapy (Signed)
[place], [place], [address] Phone: [phone]   Fax:  [phone]  Physical Therapy Treatment  Patient Details  Name: Stephen [name] MRN: [id number] Date of Birth: 1950-06-16 Referring Provider:  Zada Finders, MD  Encounter Date: 10/19/2014      PT End of Session - 10/19/14 1007    Visit Number 4   Number of Visits 16   Date for PT Re-Evaluation 11/22/14   Authorization Type Medicare: KX modifier by 15th visit, Progress note by 8th or 9th.    PT Start Time 0915   PT Stop Time 1000   PT Time Calculation (min) 45 min   Activity Tolerance Patient tolerated treatment well;No increased pain;Patient limited by fatigue      Past Medical History  Diagnosis Date  . Lumbago   . Latent tuberculosis   . Chest pain   . Intermittent claudication     bilateral  . Hyperlipidemia   . Hypertension   . Foot pain     bilateral  . Shoulder pain   . Arthropathy     shoulder  . Chronic cough   . Alcohol abuse     Past Surgical History  Procedure Laterality Date  . Laminectomy  [id number]    left lumbar   . Appendectomy    . Hammer toe surgery      right 2nd. and 5th    There were no vitals filed for this visit.  Visit Diagnosis:  Weakness of both legs  Decreased ROM of lumbar spine  Unsteadiness  Abnormal posture  Bilateral low back pain without sciatica      Subjective Assessment - 10/19/14 0919    Subjective "I've been doing my exercises, and have been walking but I do get soreness"    Currently in Pain? Yes   Pain Score 1    Pain Location Back   Pain Orientation Right;Left;Lower   Pain Descriptors / Indicators Aching   Pain Type Chronic pain                         [facility] Adult PT Treatment/Exercise - 10/19/14 0925    Static Standing Balance   Tandem Stance - Right Leg 30  2 x in // with moderate sway   Tandem Stance - Left Leg 30  2 x in // with moderate sway   Rhomberg - Eyes Closed 30  minimal postural sway   Lumbar Exercises: Stretches   Passive Hamstring Stretch 2 reps;30 seconds   Lower Trunk Rotation 5 reps;10 seconds   Lumbar Exercises: Aerobic   Stationary Bike NuStep Level 6 x 8 min legs only   Lumbar Exercises: Machines for Strengthening   Leg Press 1 plate x 10, 2 plates x 10   Lumbar Exercises: Standing   Heel Raises 15 reps;1 second   Other Standing Lumbar Exercises hip abduction/ extension 2 x 15 ea, standing hamstring curls 2 x 10  in //   Other Standing Lumbar Exercises step ups 2 x 10 with 4 inch step with hand held, side stepping 4 x 10 ft with 6#  VC to keep toes pointed forward   Lumbar Exercises: Seated   Long Arc Quad on Chair AROM;Strengthening;Both;2 sets;10 reps;Weights   LAQ on Chair Weights (lbs) 6#   Sit to Stand 10 reps  from chair, VC to pull bottom in and stand up straight  PT Education - 10/19/14 1006    Education provided Yes   Education Details posture education, updated HEP   Person(s) Educated Patient   Methods Explanation   Comprehension Verbalized understanding          PT Short Term Goals - 10/14/14 1628    PT SHORT TERM GOAL #1   Title pt will be I with initial HEP (10/28/2014)   Time 4   Period Weeks   Status On-going   PT SHORT TERM GOAL #2   Title He will demonstratre increase of trunk mobility by > 8 degrees to help promote functional progression (10/28/2014)   Time 4   Period Weeks   Status Unable to assess   PT SHORT TERM GOAL #3   Title pt will be able to maintain rhomberg balance for >15 seconds with minimal postural sway to promote balance (10/28/2014)   Time 4   Status On-going   PT SHORT TERM GOAL #4   Title pt will be able to stand > 10-20 min with < 3/10 pain to assist with exercise progression (10/28/2014)   Time 4   Period Weeks   Status On-going           PT Long Term Goals - 09/27/14 1753    PT LONG TERM GOAL #1   Title At discharge pt  will be I with all HEP given throughout therapy (11/22/14)   Time 8   Period Weeks   Status New   PT LONG TERM GOAL #2   Title He will be able to stand/walk for > 30 min with < 2/10 pain to assist with prolonged community ambulation (11/22/14)   Time 8   Period Weeks   Status New   PT LONG TERM GOAL #3   Title He will increase bil LE strength to > 4/5 to assist with endurance and safety during gait (11/22/14)   Time 8   Period Weeks   Status New   PT LONG TERM GOAL #4   Title pt will be able to maintain tandem stance bil for > 20 sec with minimal postural sway to assist with improved balance and promote safety (11/22/14)   Time 8   Period Weeks   Status New   PT LONG TERM GOAL #5   Title pt will I his FOTO score to at least 52 to demonstrate improved funcitonal capability at discharge (11/22/14)   Time 8   Period Weeks   Status New               Plan - 10/19/14 1007    Clinical Impression Statement leroy reports that he has been doing his HEP at home consistently and walking more often. Focused todays treatment on standing endurance and CKC strengthening. pt reported feelng sore in the L QL region but rpeorted the pain got better after stretching which was added to his HEP.  Requested that pt bring his [facility] in to practice with walking and promote safety.    PT Next Visit Plan bil LE strengthening, posture educatio CKC strengthening, balance training in // bars,    PT Home Exercise Plan posture education, QL stretch        Problem List Patient Active Problem List   Diagnosis Date Noted  . Left hip pain 08/31/2014  . Hidradenitis suppurativa, neck 05/05/2013  . Pre-diabetes 03/12/2013  . Loss of weight, involuntary 03/12/2013  . Hypokalemia 09/19/2012  . CVA (cerebrovascular accident) 09/08/2012  . GERD (gastroesophageal reflux disease) 01/28/2012  .  Iron deficiency anemia 06/21/2011  . Chronic back pain s/p L5-S1 laminectomy 05/09/2011  . Tremor 07/31/2010  .  ADHESIVE CAPSULITIS, LEFT 07/05/2009  . Alcohol abuse, in remission 05/02/2009  . Peripheral vascular disease 12/10/2006  . HYPERLIPIDEMIA 06/12/2006  . Essential hypertension 06/12/2006   Starr Lake PT, DPT, LAT, ATC  10/19/2014  10:11 AM      [facility] [address] [place], [place], [address] Phone: [phone]   Fax:  [phone]

## 2014-10-19 NOTE — Patient Instructions (Signed)

## 2014-10-21 ENCOUNTER — Ambulatory Visit: Payer: Commercial Managed Care - HMO | Admitting: Physical Therapy

## 2014-10-26 ENCOUNTER — Ambulatory Visit: Payer: Commercial Managed Care - HMO | Admitting: Physical Therapy

## 2014-10-27 ENCOUNTER — Encounter: Payer: Self-pay | Admitting: Internal Medicine

## 2014-10-27 ENCOUNTER — Ambulatory Visit: Payer: Commercial Managed Care - HMO | Admitting: Sports Medicine

## 2014-10-27 ENCOUNTER — Encounter: Payer: Commercial Managed Care - HMO | Admitting: Internal Medicine

## 2014-10-28 ENCOUNTER — Ambulatory Visit: Payer: Commercial Managed Care - HMO | Admitting: Physical Therapy

## 2014-11-02 ENCOUNTER — Ambulatory Visit: Payer: Commercial Managed Care - HMO | Admitting: Physical Therapy

## 2014-11-04 ENCOUNTER — Ambulatory Visit: Payer: Commercial Managed Care - HMO | Admitting: Physical Therapy

## 2014-11-09 ENCOUNTER — Ambulatory Visit: Payer: Commercial Managed Care - HMO | Attending: Student | Admitting: Physical Therapy

## 2014-11-09 DIAGNOSIS — M545 Low back pain, unspecified: Secondary | ICD-10-CM

## 2014-11-09 DIAGNOSIS — R29898 Other symptoms and signs involving the musculoskeletal system: Secondary | ICD-10-CM | POA: Diagnosis not present

## 2014-11-09 DIAGNOSIS — M5386 Other specified dorsopathies, lumbar region: Secondary | ICD-10-CM

## 2014-11-09 DIAGNOSIS — M2560 Stiffness of unspecified joint, not elsewhere classified: Secondary | ICD-10-CM | POA: Diagnosis not present

## 2014-11-09 DIAGNOSIS — R2681 Unsteadiness on feet: Secondary | ICD-10-CM | POA: Insufficient documentation

## 2014-11-09 DIAGNOSIS — R293 Abnormal posture: Secondary | ICD-10-CM | POA: Insufficient documentation

## 2014-11-09 NOTE — Therapy (Addendum)
[place], [place], [address] Phone: [phone]   Fax:  [phone]  Physical Therapy Treatment  Patient Details  Name: Jahseh Lucchese MRN: [id number] Date of Birth: [birth year] Referring Provider:  Zada Finders, MD  Encounter Date: 11/09/2014      PT End of Session - 11/09/14 0938    Visit Number 5   Number of Visits 16   Date for PT Re-Evaluation 11/22/14   Authorization Type Medicare: KX modifier by 15th visit, Progress note by 8th or 9th.    PT Start Time 0930   PT Stop Time 1015   PT Time Calculation (min) 45 min   Activity Tolerance Patient tolerated treatment well   Behavior During Therapy WFL for tasks assessed/performed      Past Medical History  Diagnosis Date  . Lumbago   . Latent tuberculosis   . Chest pain   . Intermittent claudication     bilateral  . Hyperlipidemia   . Hypertension   . Foot pain     bilateral  . Shoulder pain   . Arthropathy     shoulder  . Chronic cough   . Alcohol abuse     Past Surgical History  Procedure Laterality Date  . Laminectomy  [id number]    left lumbar   . Appendectomy    . Hammer toe surgery      right 2nd. and 5th    There were no vitals filed for this visit.  Visit Diagnosis:  Weakness of both legs  Decreased ROM of lumbar spine  Unsteadiness  Bilateral low back pain without sciatica  Abnormal posture      Subjective Assessment - 11/09/14 0936    Subjective "I've beend doing my exercises and can tell that I am walking better"   Currently in Pain? No/denies   Pain Location Back   Pain Orientation Right;Left                         [facility] Adult PT Treatment/Exercise - 11/09/14 0937    Lumbar Exercises: Stretches   Passive Hamstring Stretch 2 reps;30 seconds   Lower Trunk Rotation 5 reps;10 seconds   Lumbar Exercises: Aerobic   Stationary Bike NuStep Level 5 x 8 min legs only   Lumbar Exercises: Machines  for Strengthening   Leg Press 2 plates x 10, 3 plates x 10   Lumbar Exercises: Standing   Heel Raises 20 reps   Other Standing Lumbar Exercises hip abduction/ extension 2 x 15 ea, standing hamstring curls 2 x 10  with 2 1/2 #, VC to avoid ER during abduction   Other Standing Lumbar Exercises step ups 2 x 10 with 6 inch step with hand held, side stepping 4 x 10 ft with 6#   Lumbar Exercises: Seated   Long Arc Quad on Chair AROM;Strengthening;Both;2 sets;10 reps;Weights   LAQ on Chair Weights (lbs) 6#   LAQ on Chair Limitations VC to sit up straight, and to extend the knee is straight   Sit to Stand 10 reps  VC to tuck bottom underneath when standing   Lumbar Exercises: Supine   Clam --   Bent Knee Raise 15 reps  VC for abdominal draw in manuever   Bridge 10 reps  with ball between knees    Straight Leg Raise 10 reps;1 second                  PT Short  Term Goals - 11/09/14 1013    PT SHORT TERM GOAL #1   Title pt will be I with initial HEP (10/28/2014)   Time 4   Period Weeks   Status Achieved   PT SHORT TERM GOAL #2   Title He will demonstratre increase of trunk mobility by > 8 degrees to help promote functional progression (10/28/2014)   Time 4   Period Weeks   Status On-going   PT SHORT TERM GOAL #3   Title pt will be able to maintain rhomberg balance for >15 seconds with minimal postural sway to promote balance (10/28/2014)   Time 4   Period Weeks   Status On-going   PT SHORT TERM GOAL #4   Title pt will be able to stand > 10-20 min with < 3/10 pain to assist with exercise progression (10/28/2014)   Time 4   Period Weeks   Status On-going           PT Long Term Goals - 11/09/14 1013    PT LONG TERM GOAL #1   Title At discharge pt will be I with all HEP given throughout therapy (11/22/14)   Time 8   Period Weeks   Status On-going   PT LONG TERM GOAL #2   Title He will be able to stand/walk for > 30 min with < 2/10 pain to assist with prolonged community  ambulation (11/22/14)   Time 8   Period Weeks   Status On-going   PT LONG TERM GOAL #3   Title He will increase bil LE strength to > 4/5 to assist with endurance and safety during gait (11/22/14)   Time 8   Period Weeks   Status On-going   PT LONG TERM GOAL #4   Title pt will be able to maintain tandem stance bil for > 20 sec with minimal postural sway to assist with improved balance and promote safety (11/22/14)   Time 8   Period Weeks   Status On-going   PT LONG TERM GOAL #5   Title pt will I his FOTO score to at least 52 to demonstrate improved funcitonal capability at discharge (11/22/14)   Time 8   Period Weeks   Status On-going               Plan - 11/09/14 1010    Clinical Impression Statement Leane Para continues to make progress with increased mobility and confidence with walking and standing with a SPC. He was able to complete all exercises without complaint of pain or weakness. he plans to see the doctor after the next visit and will do a progress note.    PT Next Visit Plan bil LE strengthening, posture educatio CKC strengthening, balance training in // bars, progress note for doctors visit.    PT Home Exercise Plan no new HEP added   Consulted and Agree with Plan of Care Patient        Problem List Patient Active Problem List   Diagnosis Date Noted  . Left hip pain 08/31/2014  . Hidradenitis suppurativa, neck 05/05/2013  . Pre-diabetes 03/12/2013  . Loss of weight, involuntary 03/12/2013  . Hypokalemia 09/19/2012  . CVA (cerebrovascular accident) ([place]) 09/08/2012  . GERD (gastroesophageal reflux disease) 01/28/2012  . Iron deficiency anemia 06/21/2011  . Chronic back pain s/p L5-S1 laminectomy 05/09/2011  . Tremor 07/31/2010  . ADHESIVE CAPSULITIS, LEFT 07/05/2009  . Alcohol abuse, in remission 05/02/2009  . Peripheral vascular disease ([place]) 12/10/2006  . HYPERLIPIDEMIA 06/12/2006  .  Essential hypertension 06/12/2006   Starr Lake PT, DPT, LAT,  ATC  11/09/2014  10:14 AM      [facility] [address] [place], [place], [address] Phone: [phone]   Fax:  [phone]     PHYSICAL THERAPY DISCHARGE SUMMARY  Visits from Start of Care: 5  Current functional level related to goals / functional outcomes: FOTO 63% limited   Remaining deficits: Low back pain, bil LE weakness, unsteadiness, decreased endurance.    Education / Equipment: HEP   Plan: Patient agrees to discharge.  Patient goals were not met. Patient is being discharged due to not returning since the last visit.  ?????       Hazelgrace Bonham PT, DPT, LAT, ATC  12/02/2014  4:48 PM

## 2014-11-11 ENCOUNTER — Ambulatory Visit: Payer: Commercial Managed Care - HMO | Admitting: Physical Therapy

## 2014-11-15 ENCOUNTER — Ambulatory Visit: Payer: Commercial Managed Care - HMO | Admitting: Sports Medicine

## 2014-11-16 ENCOUNTER — Ambulatory Visit: Payer: Commercial Managed Care - HMO | Admitting: Physical Therapy

## 2014-11-18 ENCOUNTER — Ambulatory Visit: Payer: Commercial Managed Care - HMO | Admitting: Physical Therapy

## 2014-11-23 ENCOUNTER — Encounter: Payer: Commercial Managed Care - HMO | Admitting: Physical Therapy

## 2014-11-25 ENCOUNTER — Encounter: Payer: Commercial Managed Care - HMO | Admitting: Physical Therapy

## 2015-01-11 ENCOUNTER — Telehealth: Payer: Self-pay | Admitting: Licensed Clinical Social Worker

## 2015-01-11 NOTE — Telephone Encounter (Signed)
Stephen [name] was referred to CSW as pt voiced transportation as a barrier.  Pt has [employer] which offers free limited number of medical transports.  CSW placed call to Stephen [name] to inform pt of the availability of transportation through [facility] and for CSW to assist with scheduling.  Stephen [name] states "my wife handles scheduling my appointments and will be down there to schedule an appointment for me tomorrow."  CSW inquired if pt would prefer to have CSW to schedule transportation for the 01/17/15 appointment.  Pt declines and requests CSW to cancel this appointment and wait for spouse to schedule for him.  Pt agreeable to have CSW contact spouse directly to discuss scheduled appointment and available transportation options.   CSW placed call to Stephen [name] spouse, Stephen [name].  Chart indicates pt is legally separated.  Spouse was unaware of scheduled appointment.  CSW provided date, time and location of appointment.  Ms. Jolliff states she will make sure pt arrives to appointment.  CSW informed spouse of the transportation provided through [facility], spouse declines and states she will transport patient on 12/19.

## 2015-01-12 ENCOUNTER — Other Ambulatory Visit: Payer: Self-pay | Admitting: Internal Medicine

## 2015-01-17 ENCOUNTER — Encounter: Payer: Self-pay | Admitting: Internal Medicine

## 2015-01-17 ENCOUNTER — Ambulatory Visit (INDEPENDENT_AMBULATORY_CARE_PROVIDER_SITE_OTHER): Payer: Commercial Managed Care - HMO | Admitting: Internal Medicine

## 2015-01-17 VITALS — BP 132/70 | HR 81 | Temp 97.5°F | Ht 71.0 in | Wt 156.2 lb

## 2015-01-17 DIAGNOSIS — E876 Hypokalemia: Secondary | ICD-10-CM

## 2015-01-17 DIAGNOSIS — I10 Essential (primary) hypertension: Secondary | ICD-10-CM | POA: Diagnosis not present

## 2015-01-17 NOTE — Progress Notes (Signed)
Subjective:   Patient ID: Stephen [name] male   DOB: 01-07-1951 64 y.o.   MRN: [id number]  HPI: [name] is a 9 y.o. man with past medical history as described below presenting for a routine 4 month follow-up of his hypertension and chronic medications. He was seen August it was noted to be hypokalemic most likely from HCTZ and subsequently increased on potassium supplementation. He has not been having any complaints of worsening weakness or trouble tolerating his medications. He was also evaluated for his lower back pain at that time and has been following up with sports medicine and rehabilitation. Rehabilitation progress has been limited as he has residual deficits from his CVA. However he currently feels his pain is manageable and is not seeking additional treatments at this time  See problem based assessment and plan below for additional details.  Past Medical History  Diagnosis Date  . Lumbago   . Latent tuberculosis   . Chest pain   . Intermittent claudication (HCC)     bilateral  . Hyperlipidemia   . Hypertension   . Foot pain     bilateral  . Shoulder pain   . Arthropathy     shoulder  . Chronic cough   . Alcohol abuse    Current Outpatient Prescriptions  Medication Sig Dispense Refill  . amLODipine (NORVASC) 10 MG tablet Take 1 tablet (10 mg total) by mouth daily. 30 tablet 11  . atorvastatin (LIPITOR) 80 MG tablet TAKE ONE (1) TABLET BY MOUTH EVERY DAY AT 6 PM 30 tablet 11  . cilostazol (PLETAL) 100 MG tablet TAKE 1 TABLET BY MOUTH TWICE A DAY 60 tablet 11  . clopidogrel (PLAVIX) 75 MG tablet TAKE 1 TABLET (75 MG TOTAL) BY MOUTH DAILY. 30 tablet 12  . hydrochlorothiazide (HYDRODIURIL) 25 MG tablet TAKE 1 TABLET BY MOUTH EVERY DAY 30 tablet 11  . pantoprazole (PROTONIX) 20 MG tablet Take 1 tablet (20 mg total) by mouth daily. 30 tablet 11  . potassium chloride SA (K-DUR,KLOR-CON) 20 MEQ tablet Take 1 tablet (20 mEq total) by mouth daily. 3 tablet 0   No current  facility-administered medications for this visit.   Family History  Problem Relation Age of Onset  . Other      there is no specific hx of coronary arterial disease/He does not know his parents history   Social History   Social History  . Marital Status: Legally Separated    Spouse Name: N/A  . Number of Children: N/A  . Years of Education: N/A   Social History Main Topics  . Smoking status: Former Smoker    Types: Cigarettes    Quit date: 01/29/1990  . Smokeless tobacco: None  . Alcohol Use: No     Comment: quit alcohol approximately 20 years ago  . Drug Use: 1.00 per week    Special: Marijuana     Comment: marijuana use about once a week. Former cocaine use. quit in the 1980's  . Sexual Activity: Not Asked   Other Topics Concern  . None   Social History Narrative   Review of Systems: Review of Systems  Constitutional: Negative for fever, chills and weight loss.  Respiratory: Negative for shortness of breath.   Cardiovascular: Negative for chest pain and palpitations.  Gastrointestinal: Negative for abdominal pain.  Musculoskeletal: Positive for myalgias, back pain and joint pain. Negative for falls.  Neurological: Negative for headaches.    Objective:  Physical Exam: Filed Vitals:   01/17/15 1101  BP: 132/70  Pulse: 81  Temp: 97.5 F (36.4 C)  TempSrc: Oral  Height: 5\' 11"  (1.803 m)  Weight: 156 lb 3.2 oz (70.852 kg)  SpO2: 100%   GENERAL- alert, co-operative, NAD HEENT- Atraumatic, conjunctivae noninjected, oral mucosa appears moist CARDIAC- RRR, no murmurs, rubs or gallops. RESP- CTAB, no wheezes or crackles. BACK- Normal curvature, no paraspinal tenderness, no CVA tenderness. NEURO- strength upper and lower extremities- 5/5, Sensation in right arm diffusely decreased EXTREMITIES- pulse 2+, symmetric, no pedal edema. SKIN- waxy hairless legs without edema or discoloration PSYCH- pleasant demeanor, slightly slow responses to exam and  questions  Assessment & Plan:

## 2015-01-17 NOTE — Assessment & Plan Note (Signed)
BP Readings from Last 3 Encounters:  01/17/15 132/70  09/15/14 142/84  08/31/14 123/74    Lab Results  Component Value Date   NA 137 08/31/2014   K 3.1* 08/31/2014   CREATININE 0.93 08/31/2014    Assessment: Blood pressure control: Controlled Progress toward BP goal:  Remaining at goal Comments: Patient has good blood pressure on amlodipine and HCTZ. He has been walking and doing physical therapy over the recent interval.  Plan: Medications:  continue current medications Educational resources provided: brochure (denies) Other plans: Continue exercise and current medications. We are rechecking Bmet today as his last check in August required increase in potassium supplementation.

## 2015-01-17 NOTE — Patient Instructions (Addendum)
I'm glad you're feeling pretty well today.  We are rechecking your blood work today to see if you need more or less potassium. We will call you if any changes needed otherwise continue taking her medicines as previously directed.   I also think he should consider a pneumonia vaccination sometime at your next follow-up appointment. This vaccine is effective at decreasing the chance of getting a serious ammonia and last for years after the one shot.  You can follow-up with [place] at next routine appointment or make an appointment sooner if you need [place] for any reason. [phone].

## 2015-01-17 NOTE — Assessment & Plan Note (Signed)
Assessment: Patient has a history of mildly low potassium, last 3.1 in August 2016. This is associated with antihypertensive treatment on hydrochlorothiazide 25 mg. He was increased to 20 mEq daily potassium by mouth. He may need to continue this dose. We will check his metabolic panel and see how we are doing now.  Plan: Recheck Bmet today Continue K-Dur 20 mEq daily unless indicated otherwise

## 2015-01-18 LAB — BMP8+ANION GAP
ANION GAP: 15 mmol/L (ref 10.0–18.0)
BUN/Creatinine Ratio: 15 (ref 10–22)
BUN: 13 mg/dL (ref 8–27)
CO2: 27 mmol/L (ref 18–29)
Calcium: 9.1 mg/dL (ref 8.6–10.2)
Chloride: 96 mmol/L (ref 96–106)
Creatinine, Ser: 0.84 mg/dL (ref 0.76–1.27)
GFR calc Af Amer: 105 mL/min/{1.73_m2} (ref >59)
GFR calc non Af Amer: 91 mL/min/{1.73_m2} (ref >59)
Glucose: 101 mg/dL — ABNORMAL HIGH (ref 65–99)
Potassium: 3.1 mmol/L — ABNORMAL LOW (ref 3.5–5.2)
SODIUM: 138 mmol/L (ref 134–144)

## 2015-01-18 NOTE — Progress Notes (Signed)
Internal Medicine Clinic Attending  I saw and evaluated the patient.  I personally confirmed the key portions of the history and exam documented by Dr. Rice and I reviewed pertinent patient test results.  The assessment, diagnosis, and plan were formulated together and I agree with the documentation in the resident's note.  

## 2015-02-10 ENCOUNTER — Other Ambulatory Visit: Payer: Self-pay | Admitting: Internal Medicine

## 2015-05-02 ENCOUNTER — Other Ambulatory Visit: Payer: Self-pay

## 2015-05-02 DIAGNOSIS — I10 Essential (primary) hypertension: Secondary | ICD-10-CM

## 2015-05-04 MED ORDER — HYDROCHLOROTHIAZIDE 25 MG PO TABS: ORAL_TABLET | ORAL | Status: DC

## 2015-06-01 ENCOUNTER — Other Ambulatory Visit: Payer: Self-pay | Admitting: Internal Medicine

## 2015-06-01 DIAGNOSIS — I10 Essential (primary) hypertension: Secondary | ICD-10-CM

## 2015-06-01 MED ORDER — AMLODIPINE BESYLATE 10 MG PO TABS: 10.0000 mg | ORAL_TABLET | Freq: Every day | ORAL | Status: DC

## 2015-06-01 NOTE — Telephone Encounter (Signed)
Last office visit 01/17/2015.

## 2015-07-04 ENCOUNTER — Telehealth: Payer: Self-pay | Admitting: *Deleted

## 2015-07-04 NOTE — Telephone Encounter (Signed)
Call made to pt's insurance-they stated a PA was not necessary. I contacted pharmacy to make them aware, but they were still having difficulty.  I again contacted [facility] and was informed that a PA was not necessary.  Call made back to [facility] and was told that the Plavix now needed a PA.  They problem is when pharmacy tries to run both the plavix and cilostazol through the insurance will pay one or the other, but not both.  Will contact [employer] tomorrow and follow up.Despina Hidden Cassady6/5/20174:58 PM

## 2015-07-04 NOTE — Telephone Encounter (Signed)
Needs prior authorization for cilostazol. Approved through 07/02/2015 per note.

## 2015-07-06 ENCOUNTER — Other Ambulatory Visit: Payer: Self-pay | Admitting: Internal Medicine

## 2015-07-06 ENCOUNTER — Ambulatory Visit (INDEPENDENT_AMBULATORY_CARE_PROVIDER_SITE_OTHER): Payer: Commercial Managed Care - HMO | Admitting: Internal Medicine

## 2015-07-06 ENCOUNTER — Encounter: Payer: Self-pay | Admitting: Internal Medicine

## 2015-07-06 VITALS — BP 127/77 | HR 85 | Temp 98.1°F | Wt 155.8 lb

## 2015-07-06 DIAGNOSIS — K648 Other hemorrhoids: Secondary | ICD-10-CM

## 2015-07-06 DIAGNOSIS — I10 Essential (primary) hypertension: Secondary | ICD-10-CM | POA: Diagnosis not present

## 2015-07-06 DIAGNOSIS — E876 Hypokalemia: Secondary | ICD-10-CM | POA: Diagnosis not present

## 2015-07-06 DIAGNOSIS — M25562 Pain in left knee: Secondary | ICD-10-CM | POA: Diagnosis not present

## 2015-07-06 DIAGNOSIS — I739 Peripheral vascular disease, unspecified: Secondary | ICD-10-CM | POA: Diagnosis not present

## 2015-07-06 DIAGNOSIS — K649 Unspecified hemorrhoids: Secondary | ICD-10-CM | POA: Insufficient documentation

## 2015-07-06 DIAGNOSIS — K644 Residual hemorrhoidal skin tags: Secondary | ICD-10-CM

## 2015-07-06 MED ORDER — PANTOPRAZOLE SODIUM 20 MG PO TBEC: 20.0000 mg | DELAYED_RELEASE_TABLET | Freq: Every day | ORAL | Status: DC

## 2015-07-06 MED ORDER — DICLOFENAC SODIUM 1 % TD GEL: 4.0000 g | Freq: Four times a day (QID) | TRANSDERMAL | Status: DC

## 2015-07-06 MED ORDER — HYDROCORTISONE ACETATE 25 MG RE SUPP: 25.0000 mg | Freq: Two times a day (BID) | RECTAL | Status: DC

## 2015-07-06 NOTE — Patient Instructions (Addendum)
It was a pleasure to see you Mr. Eveland.  I am prescribing a medicine called Anusol for your hemorrhoids to use twice a day for 2 weeks. You can continue using Preparation H with this. You can also try Sitz Bath as instructed below to help with relief. Please continue to increase dietary fiber to soften your stool as well.  We will check an Xray of your left knee. You may have underlying arthritis causing your pain. I have prescribed Voltaren Gel that you can apply to your knee up to 4 times a day for pain relief.  We will also check lab work to see your potassium level.      How to Take a Sitz Bath A sitz bath is a warm water bath that is taken while you are sitting down. The water should only come up to your hips and should cover your buttocks. Your health care provider may recommend a sitz bath to help you:   Clean the lower part of your body, including your genital area.  With itching.  With pain.  With sore muscles or muscles that tighten or spasm. HOW TO TAKE A SITZ BATH Take 3-4 sitz baths per day or as told by your health care provider. 1. Partially fill a bathtub with warm water. You will only need the water to be deep enough to cover your hips and buttocks when you are sitting in it. 2. If your health care provider told you to put medicine in the water, follow the directions exactly. 3. Sit in the water and open the tub drain a little. 4. Turn on the warm water again to keep the tub at the correct level. Keep the water running constantly. 5. Soak in the water for 15-20 minutes or as told by your health care provider. 6. After the sitz bath, pat the affected area dry first. Do not rub it. 7. Be careful when you stand up after the sitz bath because you may feel dizzy. SEEK MEDICAL CARE IF:  Your symptoms get worse. Do not continue with sitz baths if your symptoms get worse.  You have new symptoms. Do not continue with sitz baths until you talk with your health care  provider.   This information is not intended to replace advice given to you by your health care provider. Make sure you discuss any questions you have with your health care provider.   Document Released: 10/08/2003 Document Revised: 06/01/2014 Document Reviewed: 01/13/2014 Elsevier Interactive Patient Education [employer].

## 2015-07-06 NOTE — Telephone Encounter (Signed)
PLEASE CALL [facility] [id number], THEY FAXED OVER REFILL REQUEST Monday, WANT TO KNOW STATUS

## 2015-07-06 NOTE — Progress Notes (Signed)
Patient ID: Royal Kwiecien, male   DOB: 1950/11/22, 65 y.o.   MRN: [id number]   Subjective:   Patient ID: Larri Hamel male   DOB: 10-24-1950 65 y.o.   MRN: [id number]  HPI: Mr.[name] Eastmond is a 65 y.o. male with PMH as listed below who presents for management of his HTN and hemorrhoids. Please see problem list for status of patient's chronic medical issues.    Past Medical History  Diagnosis Date  . Lumbago   . Latent tuberculosis   . Chest pain   . Intermittent claudication (HCC)     bilateral  . Hyperlipidemia   . Hypertension   . Foot pain     bilateral  . Shoulder pain   . Arthropathy     shoulder  . Chronic cough   . Alcohol abuse    Current Outpatient Prescriptions  Medication Sig Dispense Refill  . amLODipine (NORVASC) 10 MG tablet Take 1 tablet (10 mg total) by mouth daily. 30 tablet 11  . atorvastatin (LIPITOR) 80 MG tablet TAKE ONE (1) TABLET BY MOUTH EVERY DAY AT 6 PM 30 tablet 11  . cilostazol (PLETAL) 100 MG tablet TAKE ONE (1) TABLET BY MOUTH TWO (2) TIMES DAILY 60 tablet 11  . clopidogrel (PLAVIX) 75 MG tablet TAKE 1 TABLET (75 MG TOTAL) BY MOUTH DAILY. 30 tablet 12  . hydrochlorothiazide (HYDRODIURIL) 25 MG tablet TAKE 1 TABLET BY MOUTH EVERY DAY 30 tablet 11  . pantoprazole (PROTONIX) 20 MG tablet TAKE ONE (1) TABLET BY MOUTH EVERY DAY 30 tablet 0  . diclofenac sodium (VOLTAREN) 1 % GEL Apply 4 g topically 4 (four) times daily. 100 g 0  . hydrocortisone (ANUSOL-HC) 25 MG suppository Place 1 suppository (25 mg total) rectally 2 (two) times daily. 28 suppository 0  . potassium chloride SA (K-DUR,KLOR-CON) 20 MEQ tablet Take 1 tablet (20 mEq total) by mouth daily. (Patient not taking: Reported on 07/06/2015) 3 tablet 0   No current facility-administered medications for this visit.   Family History  Problem Relation Age of Onset  . Other      there is no specific hx of coronary arterial disease/He does not know his parents history   Social History   Social History    . Marital Status: Legally Separated    Spouse Name: N/A  . Number of Children: N/A  . Years of Education: N/A   Social History Main Topics  . Smoking status: Former Smoker    Types: Cigarettes    Quit date: 01/29/1990  . Smokeless tobacco: None  . Alcohol Use: No     Comment: quit alcohol approximately 20 years ago  . Drug Use: 1.00 per week    Special: Marijuana     Comment: marijuana use about once a week. Former cocaine use. quit in the 1980's  . Sexual Activity: Not Asked   Other Topics Concern  . None   Social History Narrative   Review of Systems: Review of Systems  Cardiovascular: Negative for chest pain and leg swelling.  Gastrointestinal: Negative for melena.       Bleeding Hemorrhoids, blood on toilet paper  Musculoskeletal: Positive for joint pain. Negative for myalgias and falls.       Left knee pain  Skin: Negative for rash.  Neurological: Negative for loss of consciousness and weakness.    Objective:  Physical Exam: Filed Vitals:   07/06/15 1351  BP: 127/77  Pulse: 85  Temp: 98.1 F (36.7 C)  TempSrc: Oral  Weight:  155 lb 12.8 oz (70.67 kg)  SpO2: 100%   Physical Exam  Constitutional: He is oriented to person, place, and time. No distress.  Thin elderly man, sitting in wheelchair  HENT:  Head: Normocephalic and atraumatic.  Eyes: EOM are normal.  Cardiovascular: Normal rate and regular rhythm.   Pulmonary/Chest: Effort normal. No respiratory distress. He has no wheezes. He has no rales.  Abdominal: Soft. There is no tenderness.  Genitourinary: Rectal exam shows external hemorrhoid.  External hemorrhoid seen, mildly compressible, soft, no palpable thrombus, not actively bleeding  Musculoskeletal: He exhibits no edema.  Left knee with slight tenderness to touch, no effusion, swelling, erythema, or open skin.  Neurological: He is alert and oriented to person, place, and time.  Strength 5/5 upper and lower extremities  Skin: He is not  diaphoretic.    Assessment & Plan:  Please see problem based charting for assessment and plan.

## 2015-07-07 ENCOUNTER — Other Ambulatory Visit: Payer: Self-pay | Admitting: Internal Medicine

## 2015-07-07 LAB — BMP8+ANION GAP
ANION GAP: 20 mmol/L — AB (ref 10.0–18.0)
BUN / CREAT RATIO: 15 (ref 10–24)
BUN: 14 mg/dL (ref 8–27)
CO2: 27 mmol/L (ref 18–29)
CREATININE: 0.91 mg/dL (ref 0.76–1.27)
Calcium: 9.6 mg/dL (ref 8.6–10.2)
Chloride: 94 mmol/L — ABNORMAL LOW (ref 96–106)
GFR calc Af Amer: 101 mL/min/{1.73_m2} (ref >59)
GFR, EST NON AFRICAN AMERICAN: 88 mL/min/{1.73_m2} (ref >59)
Glucose: 84 mg/dL (ref 65–99)
Potassium: 3.1 mmol/L — ABNORMAL LOW (ref 3.5–5.2)
SODIUM: 141 mmol/L (ref 134–144)

## 2015-07-07 MED ORDER — POTASSIUM CHLORIDE CRYS ER 20 MEQ PO TBCR: 20.0000 meq | EXTENDED_RELEASE_TABLET | Freq: Every day | ORAL | Status: DC

## 2015-07-07 NOTE — Telephone Encounter (Signed)
Request approved through 07/05/2017. Message left on pharmacy recorder.Despina Hidden Cassady6/8/20179:33 AM

## 2015-07-08 NOTE — Assessment & Plan Note (Signed)
Patient with history of mild hypokalemia, 3.1 on last visit in December 2016. He was previously on supplemental potassium, but says he has not had this for a while. He is not having any typical muscle cramping, but complains of left knee pain.  A/P: Likely related to HCTZ 25 mg. Repeat BMET this visit shows potassium 3.1. Will restart oral supplementation. -Resume K-Dur 20 mEq daily -If persists on supplementation, should consider either decreasing dose of HCTZ or change in class of medication (lisinopril reportedly resulted in AKI in the past, so ACE/ARB might not be an option)

## 2015-07-08 NOTE — Assessment & Plan Note (Signed)
Patient reports about 1 week history of left knee pain. He has not noticed swelling or skin changes at the site. He did not injure or hit his knee recently. He says the pain is worse in the morning and when walking. He reports locking of the knee once in a while. He is not having any other similar joint pain.  A/P: Patient has some tenderness with palpation of the left knee. There is no swelling, effusion, warmth, erythema, or skin break at the left knee. ROM is normal. His pain may be related to underlying osteoarthritis. Would also need to consider meniscal tear, gout, or pseudogout in the differential. Will obtain a standing plain film of the left knee and start conservative therapy. -Obtain Left knee Xray in the standing position -Voltaren gel -Avoid overuse or shifting weight to left knee

## 2015-07-08 NOTE — Assessment & Plan Note (Signed)
Patient reports compliance to Pletal and Plavix, however has had difficulty filling these through pharmacy because one or the other gets cancelled.  A/P: Continue Pletal for claudication Plavix as secondary prevention after history of CVA. He has some bleeding from his hemorrhoids which may be self-limited. Would continue to monitor for bleeding and consider change in therapy if this does not resolve.

## 2015-07-08 NOTE — Assessment & Plan Note (Signed)
Patient says he has had recent hemorrhoidal bleeding. He noticed blood on his toilet paper and some in the toilet bowel. He has been using Preparation H without complete relief. He has mild pain, but no profuse bleeding. His last colonoscopy in 2008 showed external and internal hemorrhoids.  A/P: Patient with visible external hemorrhoid on exam that is not actively bleeding. It is somewhat reducible, soft, without palpable thrombus. -Add Anusol suppository -Continue Preparation H -Sitz baths - patient given information in AVS for this -Increase fiber intake

## 2015-07-08 NOTE — Assessment & Plan Note (Signed)
BP Readings from Last 3 Encounters:  07/06/15 127/77  01/17/15 132/70  09/15/14 142/84   A/P: BP controlled on Amlodipine 10 mg daily and HCTZ 25 mg daily. Lisinopril is listed as an allergy med due to AKI while on this in the past. -Continue current medications -Consider change from HCTZ to different class or decreasing dose if hypokalemia persists

## 2015-07-11 NOTE — Progress Notes (Signed)
Internal Medicine Clinic Attending  Case discussed with Dr. Patel,Vishal at the time of the visit.  We reviewed the resident's history and exam and pertinent patient test results.  I agree with the assessment, diagnosis, and plan of care documented in the resident's note.  

## 2015-07-30 ENCOUNTER — Other Ambulatory Visit: Payer: Self-pay | Admitting: Internal Medicine

## 2015-10-23 ENCOUNTER — Encounter (HOSPITAL_COMMUNITY): Payer: Self-pay

## 2015-10-23 ENCOUNTER — Emergency Department (HOSPITAL_COMMUNITY)
Admission: EM | Admit: 2015-10-23 | Discharge: 2015-10-23 | Disposition: A | Discharge status: Home / Self Care | Payer: Commercial Managed Care - HMO | Attending: Emergency Medicine | Admitting: Emergency Medicine

## 2015-10-23 DIAGNOSIS — Z79899 Other long term (current) drug therapy: Secondary | ICD-10-CM | POA: Diagnosis not present

## 2015-10-23 DIAGNOSIS — Z87891 Personal history of nicotine dependence: Secondary | ICD-10-CM | POA: Diagnosis not present

## 2015-10-23 DIAGNOSIS — I10 Essential (primary) hypertension: Secondary | ICD-10-CM | POA: Diagnosis not present

## 2015-10-23 DIAGNOSIS — R300 Dysuria: Secondary | ICD-10-CM | POA: Diagnosis present

## 2015-10-23 DIAGNOSIS — N390 Urinary tract infection, site not specified: Secondary | ICD-10-CM | POA: Diagnosis not present

## 2015-10-23 DIAGNOSIS — Z8673 Personal history of transient ischemic attack (TIA), and cerebral infarction without residual deficits: Secondary | ICD-10-CM | POA: Insufficient documentation

## 2015-10-23 DIAGNOSIS — Z7902 Long term (current) use of antithrombotics/antiplatelets: Secondary | ICD-10-CM | POA: Diagnosis not present

## 2015-10-23 LAB — URINALYSIS, ROUTINE W REFLEX MICROSCOPIC
BILIRUBIN URINE: NEGATIVE
Glucose, UA: NEGATIVE mg/dL
KETONES UR: NEGATIVE mg/dL
NITRITE: NEGATIVE
Protein, ur: NEGATIVE mg/dL
Specific Gravity, Urine: 1.015 (ref 1.005–1.030)
pH: 5.5 (ref 5.0–8.0)

## 2015-10-23 LAB — RAPID HIV SCREEN (HIV 1/2 AB+AG)
HIV 1/2 ANTIBODIES: NONREACTIVE
HIV-1 P24 Antigen - HIV24: NONREACTIVE

## 2015-10-23 LAB — URINE MICROSCOPIC-ADD ON: Bacteria, UA: NONE SEEN

## 2015-10-23 MED ORDER — LIDOCAINE HCL (PF) 1 % IJ SOLN
INTRAMUSCULAR | Status: AC
  Administered 2015-10-23: 5 mL
  Filled 2015-10-23: qty 5

## 2015-10-23 MED ORDER — CEPHALEXIN 500 MG PO CAPS: 500.0000 mg | ORAL_CAPSULE | Freq: Four times a day (QID) | ORAL | 0 refills | Status: DC

## 2015-10-23 MED ORDER — AZITHROMYCIN 1 G PO PACK
1.0000 g | PACK | Freq: Once | ORAL | Status: AC
Start: 2015-10-23 — End: 2015-10-23
  Administered 2015-10-23: 1 g via ORAL
  Filled 2015-10-23: qty 1

## 2015-10-23 MED ORDER — CEFTRIAXONE SODIUM 250 MG IJ SOLR
250.0000 mg | Freq: Once | INTRAMUSCULAR | Status: AC
  Administered 2015-10-23: 250 mg via INTRAMUSCULAR
  Filled 2015-10-23: qty 250

## 2015-10-23 NOTE — ED Triage Notes (Addendum)
Pt reports dysuria, onset "days ago" and reports dark urine and purulent drainage and bilateral flank pain. Denies hematuria.

## 2015-10-23 NOTE — ED Provider Notes (Signed)
[facility] DEPT Provider Note   CSN: [id number] Arrival date & time: 10/23/15  1155     History   Chief Complaint Chief Complaint  Patient presents with  . Dysuria    HPI Stephen [name] is a 65 y.o. male.  65 year old male presents with 3 days of flank pain with dysuria as well as yellow pink drainage. Patient denies rashes been there for quite some time he is unsure. He denies any fever, vomiting. States his last social contact was 2 months ago. Denies any rashes on his penis. Denies any scrotal pain. No hematuria noted. Symptoms persisted and worsened urination. No treatment used for this prior to arrival      Past Medical History:  Diagnosis Date  . Alcohol abuse   . Arthropathy    shoulder  . Chest pain   . Chronic cough   . Foot pain    bilateral  . Hyperlipidemia   . Hypertension   . Intermittent claudication (HCC)    bilateral  . Latent tuberculosis   . Lumbago   . Shoulder pain     Patient Active Problem List   Diagnosis Date Noted  . Left knee pain 07/06/2015  . Hemorrhoids 07/06/2015  . Left hip pain 08/31/2014  . Hidradenitis suppurativa, neck 05/05/2013  . Pre-diabetes 03/12/2013  . Loss of weight, involuntary 03/12/2013  . Hypokalemia 09/19/2012  . CVA (cerebrovascular accident) ([place]) 09/08/2012  . GERD (gastroesophageal reflux disease) 01/28/2012  . Iron deficiency anemia 06/21/2011  . Chronic back pain s/p L5-S1 laminectomy 05/09/2011  . Tremor 07/31/2010  . ADHESIVE CAPSULITIS, LEFT 07/05/2009  . Alcohol abuse, in remission 05/02/2009  . Peripheral vascular disease ([place]) 12/10/2006  . HYPERLIPIDEMIA 06/12/2006  . Essential hypertension 06/12/2006    Past Surgical History:  Procedure Laterality Date  . APPENDECTOMY    . HAMMER TOE SURGERY     right 2nd. and 5th  . LAMINECTOMY  [id number]   left lumbar        Home Medications    Prior to Admission medications   Medication Sig Start Date End Date Taking? Authorizing Provider    amLODipine (NORVASC) 10 MG tablet Take 1 tablet (10 mg total) by mouth daily. 06/01/15   Nischal Narendra, MD  atorvastatin (LIPITOR) 80 MG tablet TAKE ONE (1) TABLET BY MOUTH EVERY DAY AT 6 PM 01/12/15   Zada Finders, MD  cilostazol (PLETAL) 100 MG tablet TAKE ONE (1) TABLET BY MOUTH TWO (2) TIMES DAILY 02/10/15   Zada Finders, MD  clopidogrel (PLAVIX) 75 MG tablet TAKE ONE (1) TABLET BY MOUTH EVERY DAY 08/03/15   Zada Finders, MD  hydrochlorothiazide (HYDRODIURIL) 25 MG tablet TAKE 1 TABLET BY MOUTH EVERY DAY 05/04/15   Zada Finders, MD  hydrocortisone (ANUSOL-HC) 25 MG suppository Place 1 suppository (25 mg total) rectally 2 (two) times daily. 07/06/15   Zada Finders, MD  pantoprazole (PROTONIX) 20 MG tablet Take 1 tablet (20 mg total) by mouth daily. 07/06/15   Zada Finders, MD  potassium chloride SA (K-DUR,KLOR-CON) 20 MEQ tablet TAKE ONE (1) TABLET BY MOUTH EVERY DAY 08/03/15   Zada Finders, MD  VOLTAREN 1 % GEL APPLY 4 GRAMS TO THE AFFECTED AREA FOUR TIMES DAILY 08/03/15   Zada Finders, MD    Family History Family History  Problem Relation Age of Onset  . Other      there is no specific hx of coronary arterial disease/He does not know his parents history    Social History Social History  Substance  Use Topics  . Smoking status: Former Smoker    Types: Cigarettes    Quit date: 01/29/1990  . Smokeless tobacco: Never Used  . Alcohol use No     Comment: quit alcohol approximately 20 years ago     Allergies   Aspirin and Lisinopril   Review of Systems Review of Systems  All other systems reviewed and are negative.    Physical Exam Updated Vital Signs BP 121/75   Pulse 78   Temp 98.3 F (36.8 C) (Oral)   Resp 16   Ht 5\' 11"  (1.803 m)   Wt 77.1 kg   SpO2 99%   BMI 23.71 kg/m   Physical Exam  Constitutional: He is oriented to person, place, and time. He appears well-developed and well-nourished.  Non-toxic appearance. No distress.  HENT:  Head: Normocephalic and atraumatic.   Eyes: Conjunctivae, EOM and lids are normal. Pupils are equal, round, and reactive to light.  Neck: Normal range of motion. Neck supple. No tracheal deviation present. No thyroid mass present.  Cardiovascular: Normal rate, regular rhythm and normal heart sounds.  Exam reveals no gallop.   No murmur heard. Pulmonary/Chest: Effort normal and breath sounds normal. No stridor. No respiratory distress. He has no decreased breath sounds. He has no wheezes. He has no rhonchi. He has no rales.  Abdominal: Soft. Normal appearance and bowel sounds are normal. He exhibits no distension. There is no tenderness. There is no rebound and no CVA tenderness.  Genitourinary:  Genitourinary Comments: Yellow penile drainage noted. Testicles nontender  Musculoskeletal: Normal range of motion. He exhibits no edema or tenderness.  Neurological: He is alert and oriented to person, place, and time. He has normal strength. No cranial nerve deficit or sensory deficit. GCS eye subscore is 4. GCS verbal subscore is 5. GCS motor subscore is 6.  Skin: Skin is warm and dry. No abrasion and no rash noted.  Psychiatric: He has a normal mood and affect. His speech is normal and behavior is normal.  Nursing note and vitals reviewed.    ED Treatments / Results  Labs (all labs ordered are listed, but only abnormal results are displayed) Labs Reviewed  URINALYSIS, ROUTINE W REFLEX MICROSCOPIC (NOT AT [facility]) - Abnormal; Notable for the following:       Result Value   APPearance HAZY (*)    Hgb urine dipstick TRACE (*)    Leukocytes, UA LARGE (*)    All other components within normal limits  URINE MICROSCOPIC-ADD ON - Abnormal; Notable for the following:    Squamous Epithelial / LPF 0-5 (*)    All other components within normal limits  RPR  RAPID HIV SCREEN (HIV 1/2 AB+AG)  GC/CHLAMYDIA PROBE AMP ([place]) NOT AT [facility]    EKG  EKG Interpretation None       Radiology No results found.  Procedures Procedures  (including critical care time)  Medications Ordered in ED Medications - No data to display   Initial Impression / Assessment and Plan / ED Course  I have reviewed the triage vital signs and the nursing notes.  Pertinent labs & imaging results that were available during my care of the patient were reviewed by me and considered in my medical decision making (see chart for details).  Clinical Course    Patient to be treated for likely gonorrhea here. Also treated for UTI. Syphilis and HIV testing performed. Patient should follow-up with his doctor  Final Clinical Impressions(s) / ED Diagnoses   Final  diagnoses:  None    New Prescriptions New Prescriptions   No medications on file     Lacretia Leigh, MD 10/23/15 1706

## 2015-10-24 LAB — RPR: RPR: NONREACTIVE

## 2015-10-24 LAB — GC/CHLAMYDIA PROBE AMP (~~LOC~~) NOT AT ARMC
CHLAMYDIA, DNA PROBE: NEGATIVE
Neisseria Gonorrhea: NEGATIVE

## 2015-11-09 ENCOUNTER — Other Ambulatory Visit: Payer: Self-pay | Admitting: Internal Medicine

## 2016-01-09 ENCOUNTER — Other Ambulatory Visit: Payer: Self-pay | Admitting: Internal Medicine

## 2016-02-07 ENCOUNTER — Other Ambulatory Visit: Payer: Self-pay | Admitting: Internal Medicine

## 2016-03-07 ENCOUNTER — Other Ambulatory Visit: Payer: Self-pay | Admitting: Internal Medicine

## 2016-04-02 ENCOUNTER — Other Ambulatory Visit: Payer: Self-pay | Admitting: Internal Medicine

## 2016-04-04 ENCOUNTER — Ambulatory Visit (INDEPENDENT_AMBULATORY_CARE_PROVIDER_SITE_OTHER): Payer: Medicare HMO | Admitting: Internal Medicine

## 2016-04-04 ENCOUNTER — Encounter: Payer: Self-pay | Admitting: Internal Medicine

## 2016-04-04 VITALS — BP 146/65 | HR 74 | Temp 97.7°F | Ht 71.0 in | Wt 160.3 lb

## 2016-04-04 DIAGNOSIS — Z87891 Personal history of nicotine dependence: Secondary | ICD-10-CM | POA: Diagnosis not present

## 2016-04-04 DIAGNOSIS — Z79899 Other long term (current) drug therapy: Secondary | ICD-10-CM | POA: Diagnosis not present

## 2016-04-04 DIAGNOSIS — M545 Low back pain, unspecified: Secondary | ICD-10-CM

## 2016-04-04 DIAGNOSIS — G8929 Other chronic pain: Secondary | ICD-10-CM

## 2016-04-04 DIAGNOSIS — I10 Essential (primary) hypertension: Secondary | ICD-10-CM | POA: Diagnosis not present

## 2016-04-04 DIAGNOSIS — Z1159 Encounter for screening for other viral diseases: Secondary | ICD-10-CM | POA: Diagnosis not present

## 2016-04-04 DIAGNOSIS — Z23 Encounter for immunization: Secondary | ICD-10-CM

## 2016-04-04 DIAGNOSIS — Z0000 Encounter for general adult medical examination without abnormal findings: Secondary | ICD-10-CM

## 2016-04-04 DIAGNOSIS — Z7902 Long term (current) use of antithrombotics/antiplatelets: Secondary | ICD-10-CM | POA: Diagnosis not present

## 2016-04-04 DIAGNOSIS — I739 Peripheral vascular disease, unspecified: Secondary | ICD-10-CM

## 2016-04-04 DIAGNOSIS — Z8673 Personal history of transient ischemic attack (TIA), and cerebral infarction without residual deficits: Secondary | ICD-10-CM

## 2016-04-04 DIAGNOSIS — F1011 Alcohol abuse, in remission: Secondary | ICD-10-CM | POA: Diagnosis not present

## 2016-04-04 MED ORDER — MELOXICAM 7.5 MG PO TABS: 7.5000 mg | ORAL_TABLET | Freq: Every day | ORAL | 0 refills | Status: DC

## 2016-04-04 NOTE — Progress Notes (Signed)
   CC: HTN  HPI:  [name] is a 67 y.o. male with PMH as listed below including HTN, HLD, CVA, PVD with claudication who presents for follow up management of his HTN and back pain.  Chronic Back Pain: Patient with chronic back pain with several lumbar laminectomies in the past for disk herniations. Most recent surgery was in April 2006 for re-exploration, microdiskectomy, right L5-S1, removal of foraminal herniated nucleus pulposis performed by Dr. Lorin Mercy. Patient states his back pain has flared up over the last 2 weeks without any obvious inciting event (trauma, injury, heavy lifting, etc). It is mostly in his lumbar area with a throbbing sensation and radiation to his lateral lower back area without radiation down his legs. He has not had any numbness, tingling, bowel/bladder dysfunction, or new focal weakness. He uses a walking cane at home and denies any falls. The pain is worse when he is moving, but he tries to ambulate as tolerated. He has tried Tylenol 1000 mg 2-3 times per day without significant relief. He has voltaren gel at home which does help. He denies any fevers, chills, diaphoresis, dysuria.  HTN: Patient currently takes HCTZ 25 mg daily and Amlodipine 10 mg daily. He had previous episodes of hypokalemia thought secondary to HCTZ and was on potassium supplements.  Peripheral Vascular Disease: Patient currently takes Pletal for claudication with relief. He also takes Plavix for secondary prevention after prior CVA. He is on a high-intensity Atorvastatin 80 mg daily.  Healthcare Maintenance: Patient declined the flu shot today. He is agreeable for the pneumococcal vaccine.   Past Medical History:  Diagnosis Date  . Alcohol abuse   . Arthropathy    shoulder  . Chest pain   . Chronic cough   . Foot pain    bilateral  . Hyperlipidemia   . Hypertension   . Intermittent claudication (HCC)    bilateral  . Latent tuberculosis   . Lumbago   . Shoulder pain     Review  of Systems:   Review of Systems  Constitutional: Negative for chills, diaphoresis and fever.  Genitourinary: Negative for dysuria and frequency.  Musculoskeletal: Positive for back pain and joint pain. Negative for falls.  Neurological: Negative for dizziness, tingling, sensory change, focal weakness and loss of consciousness.       No bowel/bladder dysfunction.     Physical Exam:  Vitals:   04/04/16 1508  BP: (!) 146/65  Pulse: 74  Temp: 97.7 F (36.5 C)  TempSrc: Oral  SpO2: 100%  Weight: 160 lb 4.8 oz (72.7 kg)  Height: 5\' 11"  (1.803 m)   Physical Exam  Cardiovascular: Normal rate and regular rhythm.   Pulmonary/Chest: Effort normal. No respiratory distress. He has no wheezes. He has no rales.  Musculoskeletal:  Tender to palpation at the spinous processes and paraspinal muscles in the thoracic and lumbar region. Well healed surgical scar lumbar spine. Straight leg test negative. Strength 5/5 bilateral lower extremities with flexion at the hips, extension at the knees, and extension at the ankles.  Skin: Skin is warm.    Assessment & Plan:   See Encounters Tab for problem based charting.  Patient discussed with Dr. Beryle Beams

## 2016-04-04 NOTE — Patient Instructions (Addendum)
It was a pleasure to see you again Stephen [name].  For your back pain we will try a pain and anti-inflammatory medicine called Meloxicam. Take this once a day.  Please continue to use your walker and walk as tolerated. Please also drink plenty of fluids to stay hydrated.  Please let us know if you have any fevers, chills, sweats. If you lose control of your bowel or bladder, please call us or go to the Emergency Department.  We are giving you the pneumonia vaccine today and checking blood work. Please follow up with [place] in 1 week to see how you are doing.    Back Pain, Adult Back pain is very common. The pain often gets better over time. The cause of back pain is usually not dangerous. Most people can learn to manage their back pain on their own. Follow these instructions at home: Watch your back pain for any changes. The following actions may help to lessen any pain you are feeling:  Stay active. Start with short walks on flat ground if you can. Try to walk farther each day.  Exercise regularly as told by your doctor. Exercise helps your back heal faster. It also helps avoid future injury by keeping your muscles strong and flexible.  Do not sit, drive, or stand in one place for more than 30 minutes.  Do not stay in bed. Resting more than 1-2 days can slow down your recovery.  Be careful when you bend or lift an object. Use good form when lifting:  Bend at your knees.  Keep the object close to your body.  Do not twist.  Sleep on a firm mattress. Lie on your side, and bend your knees. If you lie on your back, put a pillow under your knees.  Take medicines only as told by your doctor.  Put ice on the injured area.  Put ice in a plastic bag.  Place a towel between your skin and the bag.  Leave the ice on for 20 minutes, 2-3 times a day for the first 2-3 days. After that, you can switch between ice and heat packs.  Avoid feeling anxious or stressed. Find good ways to deal with  stress, such as exercise.  Maintain a healthy weight. Extra weight puts stress on your back. Contact a doctor if:  You have pain that does not go away with rest or medicine.  You have worsening pain that goes down into your legs or buttocks.  You have pain that does not get better in one week.  You have pain at night.  You lose weight.  You have a fever or chills. Get help right away if:  You cannot control when you poop (bowel movement) or pee (urinate).  Your arms or legs feel weak.  Your arms or legs lose feeling (numbness).  You feel sick to your stomach (nauseous) or throw up (vomit).  You have belly (abdominal) pain.  You feel like you may pass out (faint). This information is not intended to replace advice given to you by your health care provider. Make sure you discuss any questions you have with your health care provider. Document Released: 07/04/2007 Document Revised: 06/23/2015 Document Reviewed: 05/19/2013 Elsevier Interactive Patient Education  2017 [employer].

## 2016-04-05 LAB — BMP8+ANION GAP
Anion Gap: 21 mmol/L — ABNORMAL HIGH (ref 10.0–18.0)
BUN / CREAT RATIO: 11 (ref 10–24)
BUN: 11 mg/dL (ref 8–27)
CO2: 24 mmol/L (ref 18–29)
CREATININE: 1 mg/dL (ref 0.76–1.27)
Calcium: 9.5 mg/dL (ref 8.6–10.2)
Chloride: 95 mmol/L — ABNORMAL LOW (ref 96–106)
GFR calc non Af Amer: 78 mL/min/{1.73_m2} (ref >59)
GFR, EST AFRICAN AMERICAN: 90 mL/min/{1.73_m2} (ref >59)
Glucose: 90 mg/dL (ref 65–99)
Potassium: 3.6 mmol/L (ref 3.5–5.2)
Sodium: 140 mmol/L (ref 134–144)

## 2016-04-05 LAB — HEPATITIS C ANTIBODY

## 2016-04-05 NOTE — Assessment & Plan Note (Signed)
Patient currently takes Pletal for claudication with relief. He also takes Plavix for secondary prevention after prior CVA. He is on a high-intensity Atorvastatin 80 mg daily.  Symptoms are currently stable. Continue current medications.

## 2016-04-05 NOTE — Assessment & Plan Note (Addendum)
Patient declined the flu shot today. He is agreeable for the pneumococcal vaccine.  PCV13 given this visit. PPSV23 recommended in 1 year based on CDC guidelines.  Patient has a history of alcohol abuse. Hepatitis C screen is negative this visit.

## 2016-04-05 NOTE — Assessment & Plan Note (Signed)
Patient with chronic back pain with several lumbar laminectomies in the past for disk herniations. Most recent surgery was in April 2006 for re-exploration, microdiskectomy, right L5-S1, removal of foraminal herniated nucleus pulposis performed by Dr. Lorin Mercy.   Patient states his back pain has flared up over the last 2 weeks without any obvious inciting event (trauma, injury, heavy lifting, etc). It is mostly in his lumbar area with a throbbing sensation and radiation to his lateral lower back area without radiation down his legs. He has not had any numbness, tingling, bowel/bladder dysfunction, or new focal weakness. He uses a walking cane at home and denies any falls. The pain is worse when he is moving, but he tries to ambulate as tolerated. He has tried Tylenol 1000 mg 2-3 times per day without significant relief. He has voltaren gel at home which does help. He denies any fevers, chills, diaphoresis, dysuria.  Patient has reproducible tenderness to palpation at the thoracic and lumbar regions of his back including the chest wall, paraspinal muscles, and spinous processes. Symptoms are consistent with musculoskeletal pain without alarm symptoms. Will treat with short course of oral and topical NSAIDs, Tylenol, and activity as tolerated. Patient declines PT referral. He is advised to follow up in 1 week with return precautions if he has new bowel/bladder dysfunction, focal weakness, fevers, chills, or diaphoresis. Can try addition of muscle relaxant if symptoms persist and consider inflammatory myopathy in the differential. - Meloxicam 7.5 mg daily - Voltaren gel - Activity as tolerated - follow up 1 week

## 2016-04-05 NOTE — Assessment & Plan Note (Signed)
BP Readings from Last 3 Encounters:  04/04/16 (!) 146/65  10/23/15 140/89  07/06/15 127/77   Patient currently takes HCTZ 25 mg daily and Amlodipine 10 mg daily. He had previous episodes of hypokalemia thought secondary to HCTZ and was on potassium supplements.  BP slightly elevated at 146/65 this visit in the setting of pain. BMET this visit shows a K of 3.6. Will continue current medications and continue to monitor.

## 2016-04-05 NOTE — Progress Notes (Signed)
Medicine attending: Medical history, presenting problems, physical findings, and medications, reviewed with resident physician Dr Vishal Patel on the day of the patient visit and I concur with his evaluation and management plan. 

## 2016-04-11 ENCOUNTER — Ambulatory Visit (INDEPENDENT_AMBULATORY_CARE_PROVIDER_SITE_OTHER): Payer: Medicare HMO | Admitting: Internal Medicine

## 2016-04-11 DIAGNOSIS — M545 Low back pain: Secondary | ICD-10-CM

## 2016-04-11 DIAGNOSIS — S39012D Strain of muscle, fascia and tendon of lower back, subsequent encounter: Secondary | ICD-10-CM | POA: Insufficient documentation

## 2016-04-11 DIAGNOSIS — M546 Pain in thoracic spine: Secondary | ICD-10-CM | POA: Diagnosis not present

## 2016-04-11 DIAGNOSIS — Z9889 Other specified postprocedural states: Secondary | ICD-10-CM | POA: Diagnosis not present

## 2016-04-11 MED ORDER — CYCLOBENZAPRINE HCL 10 MG PO TABS: 10.0000 mg | ORAL_TABLET | Freq: Three times a day (TID) | ORAL | 0 refills | Status: DC | PRN

## 2016-04-11 NOTE — Assessment & Plan Note (Signed)
Patient has extreme tenderness with light palpation over his left paraspinal muscles in his lumbar and thoracic area. Minimal to no tenderness on the right. He is a poor historian but based on my exam I feel his presentation his consistent with a muscular strain. He has been taking meloxicam without relief. He is declining a PT referral today. I explained to him that this pain is different than is regular arthritis pain, and that physical therapy would be able to help him strengthen the muscles in his back. However he is still insistent that because of his arthritis, he does not want to try it. I advised patient to alternate heat and ice packs over the affected areas to help with swelling and inflammation. We will try flexeril TID prn in addition to his daily meloxicam. I have given return precautions with plans to refer to sports medicine for further evaluation if patient does not improve with standard therapy.  -- Start flexeril 10 mg TID prn  -- Continue Meloxicam 7.5 mg daily  -- Continue Voltaren gel  -- Return if no improvement in 1-2 weeks; will refer to Sports Medicine at that time

## 2016-04-11 NOTE — Patient Instructions (Addendum)
Stephen [name],  I have sent a prescription for flexeril (a muscle relaxant) to your pharmacy. You may take this medicine up to three times a day as needed. Please do not drive when taking this medication as it can make you sleepy. You may also alternate heat and ice packs to help with any swelling and inflammation. If you do not improve over the next week or so, please return for follow up. We may need to refer you to a sports medicine doctor for further evaluation. If you have any questions or concerns, call our clinic at [phone] or after hours call [phone] and ask for the internal medicine resident on call. Thank you!    Lumbosacral Strain Lumbosacral strain is an injury that causes pain in the lower back (lumbosacral spine). This injury usually occurs from overstretching the muscles or ligaments along your spine. A strain can affect one or more muscles or cord-like tissues that connect bones to other bones (ligaments). What are the causes? This condition may be caused by:  A hard, direct hit (blow) to the back.  Excessive stretching of the lower back muscles. This may result from:  A fall.  Lifting something heavy.  Repetitive movements such as bending or crouching. What increases the risk? The following factors may increase your risk of getting this condition:  Participating in sports or activities that involve:  A sudden twist of the back.  Pushing or pulling motions.  Being overweight or obese.  Having poor strength and flexibility, especially tight hamstrings or weak muscles in the back or abdomen.  Having too much of a curve in the lower back.  Having a pelvis that is tilted forward. What are the signs or symptoms? The main symptom of this condition is pain in the lower back, at the site of the strain. Pain may extend (radiate) down one or both legs. How is this diagnosed? This condition is diagnosed based on:  Your symptoms.  Your medical history.  A physical  exam.  Your health care provider may push on certain areas of your back to determine the source of your pain.  You may be asked to bend forward, backward, and side to side to assess the severity of your pain and your range of motion.  Imaging tests, such as:  X-rays.  MRI. How is this treated? Treatment for this condition may include:  Putting heat and cold on the affected area.  Medicines to help relieve pain and relax your muscles (muscle relaxants).  NSAIDs to help reduce swelling and discomfort. When your symptoms improve, it is important to gradually return to your normal routine as soon as possible to reduce pain, avoid stiffness, and avoid loss of muscle strength. Generally, symptoms should improve within 6 weeks of treatment. However, recovery time varies. Follow these instructions at home: Managing pain, stiffness, and swelling    If directed, put ice on the injured area during the first 24 hours after your strain.  Put ice in a plastic bag.  Place a towel between your skin and the bag.  Leave the ice on for 20 minutes, 2-3 times a day.  If directed, put heat on the affected area as often as told by your health care provider. Use the heat source that your health care provider recommends, such as a moist heat pack or a heating pad.  Place a towel between your skin and the heat source.  Leave the heat on for 20-30 minutes.  Remove the heat if your skin  turns bright red. This is especially important if you are unable to feel pain, heat, or cold. You may have a greater risk of getting burned. Activity   Rest and return to your normal activities as told by your health care provider. Ask your health care provider what activities are safe for you.  Avoid activities that take a lot of energy for as long as told by your health care provider. General instructions   Take over-the-counter and prescription medicines only as told by your health care provider.  Donot drive  or use heavy machinery while taking prescription pain medicine.  Do not use any products that contain nicotine or tobacco, such as cigarettes and e-cigarettes. If you need help quitting, ask your health care provider.  Keep all follow-up visits as told by your health care provider. This is important. How is this prevented?  Use correct form when playing sports and lifting heavy objects.  Use good posture when sitting and standing.  Maintain a healthy weight.  Sleep on a mattress with medium firmness to support your back.  Be safe and responsible while being active to avoid falls.  Do at least 150 minutes of moderate-intensity exercise each week, such as brisk walking or water aerobics. Try a form of exercise that takes stress off your back, such as swimming or stationary cycling.  Maintain physical fitness, including:  Strength.  Flexibility.  Cardiovascular fitness.  Endurance. Contact a health care provider if:  Your back pain does not improve after 6 weeks of treatment.  Your symptoms get worse. Get help right away if:  Your back pain is severe.  You cannot stand or walk.  You have difficulty controlling when you urinate or when you have a bowel movement.  You feel nauseous or you vomit.  Your feet get very cold.  You have numbness, tingling, weakness, or problems using your arms or legs.  You develop any of the following:  Shortness of breath.  Dizziness.  Pain in your legs.  Weakness in your buttocks or legs.  Discoloration of the skin on your toes or legs. This information is not intended to replace advice given to you by your health care provider. Make sure you discuss any questions you have with your health care provider. Document Released: 10/25/2004 Document Revised: 08/05/2015 Document Reviewed: 06/19/2015 Elsevier Interactive Patient Education  2017 [employer].

## 2016-04-11 NOTE — Progress Notes (Signed)
   CC: Back pain follow up   HPI:  [name] is a 66 y.o. M who presents today for follow up of his back pain. Patient has chronic lower back pain s/p multiple lumbar laminectomies (last surgery 2006) however has developed acute back pain worse than his baseline. Patient is a very poor historian and unable to truly characterize his pain or tell me when the pain started. But per chart review, it appears to have started about three weeks ago. He is unable to tell me if there was any inciting events leading to the pain, but says "I may have opened a drawer or something". He denies any heavy lifting. Patient is accompanied by his wife who is unable to provide further history. When attempting to describe the pain, he points to his left flank and lower back area. He is unable to tell me if the pain is sharp or dull. He was seen by his PCP one week ago and prescribed meloxicam and Voltaren gel without relief. Patient says he has been taking the meloxicam every day, it is unclear whether he is using the Voltaren gel. He says he uses the Voltaren gel on his knees. Pain has not improved and he is requesting different medication. He refuses PT referral because he says a prior doctor told him that he has arthritis in his back and that physical therapy wouldn't help him. He denies any alarm symptoms of fever, bowel or bladder incontinence, weakness or paresthesias.   Past Medical History:  Diagnosis Date  . Alcohol abuse   . Arthropathy    shoulder  . Chest pain   . Chronic cough   . Foot pain    bilateral  . Hyperlipidemia   . Hypertension   . Intermittent claudication (HCC)    bilateral  . Latent tuberculosis   . Lumbago   . Shoulder pain     Review of Systems:  All pertinents listed in HPI, otherwise negative  Physical Exam:  Vitals:   04/11/16 1406  BP: 115/80  Pulse: 91  Temp: 97.7 F (36.5 C)  TempSrc: Oral  SpO2: 98%  Weight: 159 lb (72.1 kg)    Constitutional: Sitting sideways  in chair, appears to be in pain  Cardiovascular: RRR Pulmonary/Chest: CTAB MSK: Extreme tenderness with light palpation over left paraspinal muscles in his thoracic and lumbar region. No tenderness on the right. No obvious bony deformities. ROM and exam limited due to pain. Lower extremity strength is intact 5/5 and symmetric bilaterally, deep tendon patellar reflexes 2+ bilaterally  Neurological: A&Ox3, CN II - XII grossly intact.   Assessment & Plan:   See Encounters Tab for problem based charting.  Patient discussed with Dr. Dareen Piano

## 2016-04-12 NOTE — Progress Notes (Signed)
Internal Medicine Clinic Attending  Case discussed with Dr. Guilloud at the time of the visit.  We reviewed the resident's history and exam and pertinent patient test results.  I agree with the assessment, diagnosis, and plan of care documented in the resident's note.  

## 2016-05-02 ENCOUNTER — Ambulatory Visit (INDEPENDENT_AMBULATORY_CARE_PROVIDER_SITE_OTHER): Payer: Medicare HMO | Admitting: Internal Medicine

## 2016-05-02 ENCOUNTER — Encounter: Payer: Self-pay | Admitting: Internal Medicine

## 2016-05-02 VITALS — BP 127/74 | HR 102 | Temp 98.6°F | Wt 156.5 lb

## 2016-05-02 DIAGNOSIS — S39012D Strain of muscle, fascia and tendon of lower back, subsequent encounter: Secondary | ICD-10-CM

## 2016-05-02 DIAGNOSIS — I10 Essential (primary) hypertension: Secondary | ICD-10-CM

## 2016-05-02 DIAGNOSIS — Z0000 Encounter for general adult medical examination without abnormal findings: Secondary | ICD-10-CM

## 2016-05-02 DIAGNOSIS — Z87891 Personal history of nicotine dependence: Secondary | ICD-10-CM | POA: Diagnosis not present

## 2016-05-02 DIAGNOSIS — I739 Peripheral vascular disease, unspecified: Secondary | ICD-10-CM | POA: Diagnosis not present

## 2016-05-02 DIAGNOSIS — M545 Low back pain: Secondary | ICD-10-CM

## 2016-05-02 DIAGNOSIS — G8929 Other chronic pain: Secondary | ICD-10-CM

## 2016-05-02 DIAGNOSIS — K648 Other hemorrhoids: Secondary | ICD-10-CM | POA: Diagnosis not present

## 2016-05-02 DIAGNOSIS — K644 Residual hemorrhoidal skin tags: Secondary | ICD-10-CM | POA: Diagnosis not present

## 2016-05-02 DIAGNOSIS — Z79899 Other long term (current) drug therapy: Secondary | ICD-10-CM

## 2016-05-02 DIAGNOSIS — Z7902 Long term (current) use of antithrombotics/antiplatelets: Secondary | ICD-10-CM

## 2016-05-02 DIAGNOSIS — Z8673 Personal history of transient ischemic attack (TIA), and cerebral infarction without residual deficits: Secondary | ICD-10-CM

## 2016-05-02 MED ORDER — MELOXICAM 7.5 MG PO TABS: 7.5000 mg | ORAL_TABLET | Freq: Every day | ORAL | 0 refills | Status: DC

## 2016-05-02 MED ORDER — HYDROCHLOROTHIAZIDE 25 MG PO TABS: ORAL_TABLET | ORAL | 11 refills | Status: DC

## 2016-05-02 MED ORDER — HYDROCORTISONE ACETATE 25 MG RE SUPP: 25.0000 mg | Freq: Two times a day (BID) | RECTAL | 0 refills | Status: DC

## 2016-05-02 MED ORDER — HYDROCORTISONE 1 % EX CREA: TOPICAL_CREAM | CUTANEOUS | 1 refills | Status: DC

## 2016-05-02 MED ORDER — CYCLOBENZAPRINE HCL 10 MG PO TABS: 10.0000 mg | ORAL_TABLET | Freq: Three times a day (TID) | ORAL | 0 refills | Status: DC | PRN

## 2016-05-02 NOTE — Patient Instructions (Signed)
It was a pleasure to see you again Stephen [name].  For your hemorrhoids we will try the following: Anusol suppository Preparation H cream Sitz baths Increase fiber intake  I am glad your back pain is improving. We will continue the Meloxicam (Mobic) and muscle relaxer (Flexeril).  You will be due for repeat colonoscopy in a few months. We will work on setting this up in the next few months.   How to Take a Sitz Bath A sitz bath is a warm water bath that is taken while you are sitting down. The water should only come up to your hips and should cover your buttocks. Your health care provider may recommend a sitz bath to help you:  Clean the lower part of your body, including your genital area.  With itching.  With pain.  With sore muscles or muscles that tighten or spasm. How to take a sitz bath Take 3-4 sitz baths per day or as told by your health care provider. 1. Partially fill a bathtub with warm water. You will only need the water to be deep enough to cover your hips and buttocks when you are sitting in it. 2. If your health care provider told you to put medicine in the water, follow the directions exactly. 3. Sit in the water and open the tub drain a little. 4. Turn on the warm water again to keep the tub at the correct level. Keep the water running constantly. 5. Soak in the water for 15-20 minutes or as told by your health care provider. 6. After the sitz bath, pat the affected area dry first. Do not rub it. 7. Be careful when you stand up after the sitz bath because you may feel dizzy. Contact a health care provider if:  Your symptoms get worse. Do not continue with sitz baths if your symptoms get worse.  You have new symptoms. Do not continue with sitz baths until you talk with your health care provider. This information is not intended to replace advice given to you by your health care provider. Make sure you discuss any questions you have with your health care  provider. Document Released: 10/08/2003 Document Revised: 06/15/2015 Document Reviewed: 01/13/2014 Elsevier Interactive Patient Education  2017 [employer].

## 2016-05-02 NOTE — Progress Notes (Signed)
CC: Hemorrhoids  HPI:  [name] is a 66 y.o. male with PMH as listed below including HTN, HLD, CVA, PVD with claudication who presents for follow up management of his HTN, back pain, and hemorrhoids.  Chronic Back Pain with Lumbar Strain: Patient with chronic back pain with several lumbar laminectomies in the past for disk herniations. Most recent surgery was in April 2006 for re-exploration, microdiskectomy, right L5-S1, removal of foraminal herniated nucleus pulposis performed by Dr. Lorin [name]. Patient was seen in clinic last month for a flare up of his back pain which was consistent with lumbar strain. He denies any inciting event (trauma, injury, heavy lifting, etc). He was treated with Meloxicam and Flexeril with good response. He states his back pain has returned to his baseline.  HTN: Patient currently takes HCTZ 25 mg daily and Amlodipine 10 mg daily to which he reports adherence.  Peripheral Vascular Disease: Patient currently takes Pletal for claudication with relief. He also takes Plavix for secondary prevention after prior CVA. He is on high-intensity Atorvastatin 80 mg daily. He reports adherence to these medications. He denies any claudication symptoms recently.  Hemorrhoids: Patient says he has had recent recurrent hemorrhoidal bleeding. He noticed blood on his toilet paper but no blood dripping into the toilet bowel or mixed into his stool. He is also having occasional pain with defecation. He denies any pruritus in the anorectal region. He has previously used Preparation H and Anusol. He has mild pain, but no profuse bleeding. His last colonoscopy in 2008 showed external and internal hemorrhoids. He has refused a rectal exam this visit.  Healthcare Maintenance: Patient will be due for repeat 10-year screening colonoscopy in July of this year.   Past Medical History:  Diagnosis Date  . Alcohol abuse   . Arthropathy    shoulder  . Chest pain   . Chronic cough   . Foot  pain    bilateral  . Hyperlipidemia   . Hypertension   . Intermittent claudication (HCC)    bilateral  . Latent tuberculosis   . Lumbago   . Shoulder pain     Review of Systems:   Review of Systems  HENT: Negative for nosebleeds.   Respiratory: Negative for hemoptysis and shortness of breath.   Cardiovascular: Negative for chest pain and claudication.  Gastrointestinal: Negative for blood in stool, constipation, diarrhea and melena.       Hemorrhoids  Genitourinary: Negative for hematuria.  Musculoskeletal: Negative for back pain and falls.  Neurological: Negative for loss of consciousness.     Physical Exam:  Vitals:   05/02/16 1551  BP: 127/74  Pulse: (!) 102  Temp: 98.6 F (37 C)  TempSrc: Oral  SpO2: 100%  Weight: 156 lb 8 oz (71 kg)   Physical Exam  Constitutional: No distress.  Ambulates with a walking stick  Cardiovascular: Normal rate and regular rhythm.   Pulmonary/Chest: Effort normal. No respiratory distress. He has no wheezes. He has no rales.  Genitourinary:  Genitourinary Comments: Patient refused rectal exam.  Musculoskeletal: He exhibits no edema.       Cervical back: He exhibits no tenderness.       Thoracic back: He exhibits no tenderness.       Lumbar back: He exhibits no tenderness.  Skin: He is not diaphoretic.    Assessment & Plan:   See Encounters Tab for problem based charting.  Patient discussed with Dr. Dareen Piano  Peripheral vascular disease Patients PVD is currently stable. He will  continue both Pletal (for claudication relief) and Plavix (for secondary prevention after prior CVA). He reports seeing blood on the toilet paper when he wipes but no profuse rectal bleeding. This is attributable to his known hemorrhoids. He denies any other obvious bleeding.  - Continue Pletal 100 mg po BID - Continue Plavix 75 mg once daily - Continue Atorvastatin 80 mg daily  Hemorrhoids Patient refused rectal exam this visit. His reported symptoms  are consistent with mild hemorrhoidal bleeding. Will treat with hemmorhoidal cream, suppository, Sitz bath, and increased fiber intake. He is due for repeat colonoscopy in July 2018, which I will refer him for. - Preparation H - Anusol suppository - Sitz baths - patient given information in AVS which I reviewed with him - Increase fiber intake - Refer for routine screening colonoscopy  Essential hypertension BP Readings from Last 3 Encounters:  05/02/16 127/74  04/11/16 115/80  04/04/16 (!) 146/65   Patient's blood pressure is well controlled. - Continue HCTZ 25 mg daily - Continue Amlodipine 10 mg daily  Lumbar strain, subsequent encounter Patient's acute on chronic back pain was consistent with lumbar strain. This has now resolved back to his baseline. Will continue with as needed Meloxicam and Flexeril. He has Voltaren gel at home which also provides relief. He is advised on monitoring for gastritis type symptoms with NSAID use and somnolence with Flexeril. He declined PT referral. - Continue Meloxicam 7.5 mg once daily as needed  - Continue Flexeril 10 mg TID prn - Continue Voltaren gel  Healthcare maintenance Patient will be due for repeat 10-year screening colonoscopy in July of this year. He will also be due for PPSV23 vaccine in March 2019. - Refer for screening Colonoscopy in July 2018 - PPSV23 due in March 2019

## 2016-05-03 ENCOUNTER — Telehealth: Payer: Self-pay | Admitting: *Deleted

## 2016-05-03 NOTE — Telephone Encounter (Addendum)
Information was sent to CoverMyMeds for PA for Cyclobenzaprine.  Sander Nephew, RN 05/03/2016 11:32 AM PA was denied because patient has not tried Baclofen or Tizanidine if possible for alternative treatment.  Message to be sent to Dr. Sherrye Payor .  Regino Schultze Cova Knieriem,RN 05/04/2016 12:01 PM.

## 2016-05-08 NOTE — Assessment & Plan Note (Signed)
Patient's acute on chronic back pain was consistent with lumbar strain. This has now resolved back to his baseline. Will continue with as needed Meloxicam and Flexeril. He has Voltaren gel at home which also provides relief. He is advised on monitoring for gastritis type symptoms with NSAID use and somnolence with Flexeril. He declined PT referral. - Continue Meloxicam 7.5 mg once daily as needed  - Continue Flexeril 10 mg TID prn - Continue Voltaren gel

## 2016-05-08 NOTE — Assessment & Plan Note (Signed)
Patients PVD is currently stable. He will continue both Pletal (for claudication relief) and Plavix (for secondary prevention after prior CVA). He reports seeing blood on the toilet paper when he wipes but no profuse rectal bleeding. This is attributable to his known hemorrhoids. He denies any other obvious bleeding.  - Continue Pletal 100 mg po BID - Continue Plavix 75 mg once daily - Continue Atorvastatin 80 mg daily

## 2016-05-08 NOTE — Assessment & Plan Note (Signed)
BP Readings from Last 3 Encounters:  05/02/16 127/74  04/11/16 115/80  04/04/16 (!) 146/65   Patient's blood pressure is well controlled. - Continue HCTZ 25 mg daily - Continue Amlodipine 10 mg daily

## 2016-05-08 NOTE — Assessment & Plan Note (Signed)
Patient refused rectal exam this visit. His reported symptoms are consistent with mild hemorrhoidal bleeding. Will treat with hemmorhoidal cream, suppository, Sitz bath, and increased fiber intake. He is due for repeat colonoscopy in July 2018, which I will refer him for. - Preparation H - Anusol suppository - Sitz baths - patient given information in AVS which I reviewed with him - Increase fiber intake - Refer for routine screening colonoscopy

## 2016-05-08 NOTE — Assessment & Plan Note (Signed)
Patient will be due for repeat 10-year screening colonoscopy in July of this year. He will also be due for PPSV23 vaccine in March 2019. - Refer for screening Colonoscopy in July 2018 - PPSV23 due in March 2019

## 2016-05-09 NOTE — Progress Notes (Signed)
Internal Medicine Clinic Attending  Case discussed with Dr. Patel,Vishal at the time of the visit.  We reviewed the resident's history and exam and pertinent patient test results.  I agree with the assessment, diagnosis, and plan of care documented in the resident's note.  

## 2016-05-11 ENCOUNTER — Telehealth: Payer: Self-pay | Admitting: *Deleted

## 2016-05-11 NOTE — Telephone Encounter (Addendum)
Prior Authorization sent to CoverMyMeds for Anucort -HC 25 mg.  Denied .  Forms placed in Dr. Sherrye Payor box for appeal.  Sander Nephew, RN 05/11/2016 3:30 PM. Call to patient's insurance company alternatives include Hydrocortisone 1%,  Proctoto-Med HL,  Proto-Pak 1%, Proctosol HC 2.5 % and Proctozo HC 2.5 %.  Message to be sent to Dr. Sherrye Payor.  Sander Nephew, RN 05/24/2016 2:20 PM.

## 2016-05-30 ENCOUNTER — Other Ambulatory Visit: Payer: Self-pay | Admitting: Internal Medicine

## 2016-05-30 DIAGNOSIS — S39012D Strain of muscle, fascia and tendon of lower back, subsequent encounter: Secondary | ICD-10-CM

## 2016-06-04 ENCOUNTER — Other Ambulatory Visit: Payer: Self-pay

## 2016-06-04 ENCOUNTER — Other Ambulatory Visit: Payer: Self-pay | Admitting: Internal Medicine

## 2016-06-04 DIAGNOSIS — I10 Essential (primary) hypertension: Secondary | ICD-10-CM

## 2016-06-05 MED ORDER — AMLODIPINE BESYLATE 10 MG PO TABS: 10.0000 mg | ORAL_TABLET | Freq: Every day | ORAL | 11 refills | Status: DC

## 2016-06-22 ENCOUNTER — Encounter: Payer: Self-pay | Admitting: Gastroenterology

## 2016-06-29 ENCOUNTER — Encounter: Payer: Self-pay | Admitting: Gastroenterology

## 2016-07-06 ENCOUNTER — Encounter: Payer: Self-pay | Admitting: Internal Medicine

## 2016-08-08 ENCOUNTER — Other Ambulatory Visit: Payer: Self-pay | Admitting: Internal Medicine

## 2016-08-10 ENCOUNTER — Telehealth: Payer: Self-pay | Admitting: *Deleted

## 2016-08-10 ENCOUNTER — Encounter (INDEPENDENT_AMBULATORY_CARE_PROVIDER_SITE_OTHER): Payer: Self-pay

## 2016-08-10 ENCOUNTER — Encounter: Payer: Self-pay | Admitting: Physician Assistant

## 2016-08-10 ENCOUNTER — Ambulatory Visit (INDEPENDENT_AMBULATORY_CARE_PROVIDER_SITE_OTHER): Payer: Medicare HMO | Admitting: Physician Assistant

## 2016-08-10 VITALS — BP 110/64 | HR 88 | Ht 71.0 in | Wt 155.6 lb

## 2016-08-10 DIAGNOSIS — Z1211 Encounter for screening for malignant neoplasm of colon: Secondary | ICD-10-CM

## 2016-08-10 DIAGNOSIS — Z7901 Long term (current) use of anticoagulants: Secondary | ICD-10-CM | POA: Diagnosis not present

## 2016-08-10 DIAGNOSIS — Z01818 Encounter for other preprocedural examination: Secondary | ICD-10-CM

## 2016-08-10 MED ORDER — NA SULFATE-K SULFATE-MG SULF 17.5-3.13-1.6 GM/177ML PO SOLN
ORAL | 0 refills | Status: DC
Start: 2016-08-10 — End: 2016-08-28

## 2016-08-10 NOTE — Telephone Encounter (Signed)
08/10/2016   RE: Gedalia Mcmillon DOB: 11/02/1950 MRN: [id number]   Dear dr. Zada Finders,    We have scheduled the above patient for an endoscopic procedure. Our records show that he is on anticoagulation therapy.   Please advise as to how long the patient may come off his therapy of Plavix prior to the procedure, which is scheduled for 08-28-2016.  Please fax back the completed form to [facility] at [phone].   Sincerely,    Ellouise Newer, PA-C

## 2016-08-10 NOTE — Progress Notes (Signed)
Chief Complaint: Consult for a screening colonoscopy in a patient on chronic anticoagulation  HPI:  Stephen [name] is a 65 year old African-American male with a past medical history as listed below including intermittent claudication on Pletal and Plavix for past CVA, who presents to clinic today for preprocedural exam for screening colonoscopy.   Per review of chart patient regularly follows with Dr. Ardis Hughs and his last colonoscopy was on 08/06/2006 which was abnormal for 3 diminutive polyps. It was recommended he have repeat in 10 years.   Today, the patient presents to clinic accompanied by his wife, and neither of them are really sure why he is here but they do know he is due for a screening colonoscopy. Patient tells me he did have some intermittent abdominal pain for a while, but this is now gone after they "stopped that one medicine". He does not know what medicine and cannot really tell me much about his abdominal pain.   Patient denies fever, chills, blood in his stool, melena, change in bowel habits, weight loss, fatigue, vomiting, heartburn or reflux.  Past Medical History:  Diagnosis Date  . Alcohol abuse   . Arthropathy    shoulder  . Chest pain   . Chronic cough   . Foot pain    bilateral  . Hyperlipidemia   . Hypertension   . Intermittent claudication (HCC)    bilateral  . Latent tuberculosis   . Lumbago   . Shoulder pain     Past Surgical History:  Procedure Laterality Date  . APPENDECTOMY    . HAMMER TOE SURGERY     right 2nd. and 5th  . LAMINECTOMY  [id number]   left lumbar     Current Outpatient Prescriptions  Medication Sig Dispense Refill  . amLODipine (NORVASC) 10 MG tablet Take 1 tablet (10 mg total) by mouth daily. 30 tablet 11  . atorvastatin (LIPITOR) 80 MG tablet TAKE ONE (1) TABLET BY MOUTH EVERY DAY AT 6:00PM 30 tablet 11  . cilostazol (PLETAL) 100 MG tablet TAKE ONE (1) TABLET BY MOUTH TWO (2) TIMES DAILY 60 tablet 6  . clopidogrel (PLAVIX) 75 MG  tablet TAKE ONE (1) TABLET BY MOUTH EVERY DAY 30 tablet 11  . cyclobenzaprine (FLEXERIL) 10 MG tablet TAKE ONE TABLET BY MOUTH THREE TIMES DAILY AS NEEDED FOR MUSCLE SPASMS 30 tablet 0  . hydrochlorothiazide (HYDRODIURIL) 25 MG tablet TAKE 1 TABLET BY MOUTH EVERY DAY 30 tablet 11  . meloxicam (MOBIC) 7.5 MG tablet TAKE ONE (1) TABLET BY MOUTH EVERY DAY 30 tablet 0  . pantoprazole (PROTONIX) 20 MG tablet TAKE ONE (1) TABLET BY MOUTH EVERY DAY 90 tablet 0  . potassium chloride SA (K-DUR,KLOR-CON) 20 MEQ tablet TAKE ONE (1) TABLET BY MOUTH EVERY DAY 30 tablet 2  . VOLTAREN 1 % GEL APPLY 4 GRAMS TO THE AFFECTED AREA FOUR TIMES DAILY 100 g 0  . hydrocortisone cream 1 % Apply to affected area 2 times daily (Patient not taking: Reported on 08/10/2016) 30 g 1   No current facility-administered medications for this visit.     Allergies as of 08/10/2016 - Review Complete 08/10/2016  Allergen Reaction Noted  . Aspirin Other (See Comments)   . Lisinopril Other (See Comments) 09/28/2011    Family History  Problem Relation Age of Onset  . Other Unknown        there is no specific hx of coronary arterial disease/He does not know his parents history  . Cancer Sister  unknown type    Social History   Social History  . Marital status: Legally Separated    Spouse name: N/A  . Number of children: N/A  . Years of education: N/A   Occupational History  . Not on file.   Social History Main Topics  . Smoking status: Former Smoker    Types: Cigarettes    Quit date: 01/29/1990  . Smokeless tobacco: Never Used  . Alcohol use No     Comment: quit alcohol approximately 20 years ago  . Drug use: Yes    Frequency: 1.0 time per week    Types: Marijuana     Comment: marijuana use about once a week. Former cocaine use. quit in the 1980's  . Sexual activity: Not on file   Other Topics Concern  . Not on file   Social History Narrative  . No narrative on file    Review of Systems:      Constitutional: No weight loss, fever or chills Skin: No rash  Cardiovascular: No chest pain Respiratory: No SOB Gastrointestinal: See HPI and otherwise negative Genitourinary: No dysuria Neurological: No headache Musculoskeletal: No new muscle or joint pain Hematologic: No bleeding or bruising Psychiatric: No history of depression or anxiety    Physical Exam:  Vital signs: BP 110/64   Pulse 88   Ht 5\' 11"  (1.803 m)   Wt 155 lb 9.6 oz (70.6 kg)   BMI 21.70 kg/m   Constitutional:   Pleasant African American male appears to be in NAD, Well developed, Well nourished, alert and cooperative Head:  Normocephalic and atraumatic. Eyes:   PEERL, EOMI. No icterus. Conjunctiva pink. Ears:  Normal auditory acuity. Neck:  Supple Throat: Oral cavity and pharynx without inflammation, swelling or lesion.  Respiratory: Respirations even and unlabored. Lungs clear to auscultation bilaterally.   No wheezes, crackles, or rhonchi.  Cardiovascular: Normal S1, S2. No MRG. Regular rate and rhythm. No peripheral edema, cyanosis or pallor.  Gastrointestinal:  Soft, nondistended, nontender. No rebound or guarding. Normal bowel sounds. No appreciable masses or hepatomegaly. Rectal:  Not performed.  Msk:  Symmetrical without gross deformities. Without edema, no deformity or joint abnormality.  Neurologic:  Alert and  oriented x4;  grossly normal neurologically.  Skin:   Dry and intact without significant lesions or rashes. Psychiatric: Demonstrates good judgement and reason without abnormal affect or behaviors.  No recent labs or imaging.  Assessment: 1. Preprocedural exam for screening colonoscopy:last one 2008, due for repeat 2. Chronic anticoagulation: Platel and Plavix  Plan: 1. Patient is already scheduled for his screening colonoscopy on 08/28/16 with Dr. Ardis Hughs. Did review risks, benefits, limitations and alternatives the patient agrees to proceed. 2. Patient was advised to hold his Plavix for  5 days prior to his procedure. We will communicate with his cardiologist to ensure that holding his Plavix is acceptable for him. 3. Patient to return to clinic per recommendations from Dr. Ardis Hughs after time of procedure.  Ellouise Newer, PA-C [facility] 08/10/2016, 1:59 PM  Cc: Zada Finders, MD

## 2016-08-10 NOTE — Patient Instructions (Addendum)
You have been scheduled for a colonoscopy. Please follow written instructions given to you at your visit today.  Please pick up your prep supplies at the pharmacy within the next 1-3 days. [place] If you use inhalers (even only as needed), please bring them with you on the day of your procedure. Your physician has requested that you go to www.startemmi.com and enter the access code given to you at your visit today. This web site gives a general overview about your procedure. However, you should still follow specific instructions given to you by our office regarding your preparation for the procedure.

## 2016-08-10 NOTE — Progress Notes (Signed)
I agree with the above note, plan 

## 2016-08-13 NOTE — Telephone Encounter (Signed)
Mr. Waddington is taking Plavix for secondary stroke prophylaxis. If needed, it is okay to hold Plavix 5 days prior to his scheduled endoscopic procedure on 08/28/16. Would recommend resuming as soon as possible after procedure if able (24 hours).   He is also on Pletal for peripheral vascular disease and claudication. Ok to hold Pletal 5 days before procedure if needed. Pletal may be held after procedure if needed, otherwise may resume 1-2 days afterwards.  Please contact me for any questions.  Thank you,  Zada Finders, MD

## 2016-08-16 NOTE — Telephone Encounter (Signed)
Called patient to advise, per Dr. Zada Finders, he can hold both the Plavix and Pletal 5 days prior to the procedure date of 08-28-2016. I told him he can hold it on 7-26 through and including 7-31. He can resume within 24 hours of the procedure. The patient verbalized understanding the medication instructions.

## 2016-08-28 ENCOUNTER — Encounter: Payer: Self-pay | Admitting: Gastroenterology

## 2016-08-28 ENCOUNTER — Ambulatory Visit (AMBULATORY_SURGERY_CENTER): Payer: Medicare HMO | Admitting: Gastroenterology

## 2016-08-28 VITALS — BP 156/74 | HR 49 | Temp 100.0°F | Resp 22 | Ht 71.0 in | Wt 155.0 lb

## 2016-08-28 DIAGNOSIS — Z1212 Encounter for screening for malignant neoplasm of rectum: Secondary | ICD-10-CM

## 2016-08-28 DIAGNOSIS — K649 Unspecified hemorrhoids: Secondary | ICD-10-CM | POA: Diagnosis not present

## 2016-08-28 DIAGNOSIS — K635 Polyp of colon: Secondary | ICD-10-CM | POA: Diagnosis not present

## 2016-08-28 DIAGNOSIS — D127 Benign neoplasm of rectosigmoid junction: Secondary | ICD-10-CM | POA: Diagnosis not present

## 2016-08-28 DIAGNOSIS — Z1211 Encounter for screening for malignant neoplasm of colon: Secondary | ICD-10-CM

## 2016-08-28 DIAGNOSIS — D123 Benign neoplasm of transverse colon: Secondary | ICD-10-CM

## 2016-08-28 DIAGNOSIS — D120 Benign neoplasm of cecum: Secondary | ICD-10-CM | POA: Diagnosis not present

## 2016-08-28 MED ORDER — SODIUM CHLORIDE 0.9 % IV SOLN: 500.0000 mL | INTRAVENOUS | Status: DC

## 2016-08-28 NOTE — Progress Notes (Signed)
To PACU VSS. Report to [email or web address] 

## 2016-08-28 NOTE — Patient Instructions (Addendum)
YOU HAD AN ENDOSCOPIC PROCEDURE TODAY AT [facility]:   Refer to the procedure report that was given to you for any specific questions about what was found during the examination.  If the procedure report does not answer your questions, please call your gastroenterologist to clarify.  If you requested that your care partner not be given the details of your procedure findings, then the procedure report has been included in a sealed envelope for you to review at your convenience later.  YOU SHOULD EXPECT: Some feelings of bloating in the abdomen. Passage of more gas than usual.  Walking can help get rid of the air that was put into your GI tract during the procedure and reduce the bloating. If you had a lower endoscopy (such as a colonoscopy or flexible sigmoidoscopy) you may notice spotting of blood in your stool or on the toilet paper. If you underwent a bowel prep for your procedure, you may not have a normal bowel movement for a few days.  Please Note:  You might notice some irritation and congestion in your nose or some drainage.  This is from the oxygen used during your procedure.  There is no need for concern and it should clear up in a day or so.  SYMPTOMS TO REPORT IMMEDIATELY:   Following lower endoscopy (colonoscopy or flexible sigmoidoscopy):  Excessive amounts of blood in the stool  Significant tenderness or worsening of abdominal pains  Swelling of the abdomen that is new, acute  Fever of 100F or higher  Please read all handouts given to you by your recovery nurse. Restart plavix and pletal tomorrow.  For urgent or emergent issues, a gastroenterologist can be reached at any hour by calling [phone].   DIET:  We do recommend a small meal at first, but then you may proceed to your regular diet.  Drink plenty of fluids but you should avoid alcoholic beverages for 24 hours.  ACTIVITY:  You should plan to take it easy for the rest of today and you should NOT  DRIVE or use heavy machinery until tomorrow (because of the sedation medicines used during the test).    FOLLOW UP: Our staff will call the number listed on your records the next business day following your procedure to check on you and address any questions or concerns that you may have regarding the information given to you following your procedure. If we do not reach you, we will leave a message.  However, if you are feeling well and you are not experiencing any problems, there is no need to return our call.  We will assume that you have returned to your regular daily activities without incident.  If any biopsies were taken you will be contacted by phone or by letter within the next 1-3 weeks.  Please call us at [phone] if you have not heard about the biopsies in 3 weeks.    SIGNATURES/CONFIDENTIALITY: You and/or your care partner have signed paperwork which will be entered into your electronic medical record.  These signatures attest to the fact that that the information above on your After Visit Summary has been reviewed and is understood.  Full responsibility of the confidentiality of this discharge information lies with you and/or your care-partner.  Thank you for letting us take care of your healthcare needs today.

## 2016-08-28 NOTE — Progress Notes (Signed)
Pt. Reports he has not taken any of his medication in the past week.  Pt.'s wife confirms this.  Pt. Denies any awareness of febrile illness.  Pt. Not coughing, denies headache, malaise, aching, chills, congestion or runny nose.  Dr. Ardis Hughs informed of pt. Temperature, and lack of symptoms.  Pt.'s wife denies awareness of any recent symptoms of complaint of illness from patient.

## 2016-08-28 NOTE — Op Note (Signed)
[facility] Patient Name: Stephen [name] Procedure Date: 08/28/2016 1:49 PM MRN: [id number] Endoscopist: Milus Banister , MD Age: 66 Referring MD:  Date of Birth: 08/03/1950 Gender: Male Account #: [id number] Procedure:                Colonoscopy Indications:              Screening for colorectal malignant neoplasm;                            colonoscopy 2008 3 small hyperplastic polyps Medicines:                Monitored Anesthesia Care Procedure:                Pre-Anesthesia Assessment:                           - Prior to the procedure, a History and Physical                            was performed, and patient medications and                            allergies were reviewed. The patient's tolerance of                            previous anesthesia was also reviewed. The risks                            and benefits of the procedure and the sedation                            options and risks were discussed with the patient.                            All questions were answered, and informed consent                            was obtained. Prior Anticoagulants: The patient has                            taken Plavix (clopidogrel), last dose was 5 days                            prior to procedure. ASA Grade Assessment: III - A                            patient with severe systemic disease. After                            reviewing the risks and benefits, the patient was                            deemed in satisfactory condition to undergo the  procedure.                           After obtaining informed consent, the colonoscope                            was passed under direct vision. Throughout the                            procedure, the patient's blood pressure, pulse, and                            oxygen saturations were monitored continuously. The                            Colonoscope was introduced through the anus and                    advanced to the the cecum, identified by                            appendiceal orifice and ileocecal valve. The                            colonoscopy was performed without difficulty. The                            patient tolerated the procedure well. The quality                            of the bowel preparation was good. The ileocecal                            valve, appendiceal orifice, and rectum were                            photographed. Scope In: 1:56:12 PM Scope Out: 2:21:56 PM Scope Withdrawal Time: 0 hours 21 minutes 51 seconds  Total Procedure Duration: 0 hours 25 minutes 44 seconds  Findings:                 Two sessile polyps were found in the transverse                            colon and cecum. The polyps were 2 to 3 mm in size.                            These polyps were removed with a cold snare.                            Resection and retrieval were complete.                           A 20 mm polyp was found in the hepatic flexure. The  polyp was sessile. The polyp was removed with a hot                            snare, piecemeal technique. Resection and retrieval                            were complete.                           Multiple hyperplastic appearing polyps were found                            in the recto-sigmoid colon. The polyps were 2 to 3                            mm in size. These were sampled with forcep biopsy.                           The exam was otherwise without abnormality on                            direct and retroflexion views.                           Internal hemorrhoids were found. The hemorrhoids                            were medium-sized. Complications:            No immediate complications. Estimated blood loss:                            None. Estimated Blood Loss:     Estimated blood loss: none. Impression:               - Two 2 to 3 mm polyps in the transverse colon and                             in the cecum, removed with a cold snare. Resected                            and retrieved. jar 1                           - One 20 mm polyp at the hepatic flexure, removed                            with a hot snare. Resected and retrieved. jar 2                           - Multiple (20-30) hyperplastic appearing 2 to 3 mm                            polyps at the recto-sigmoid colon. Sampled by  biopsy. jar 3                           - The examination was otherwise normal on direct                            and retroflexion views.                           - Internal hemorrhoids. Recommendation:           - Patient has a contact number available for                            emergencies. The signs and symptoms of potential                            delayed complications were discussed with the                            patient. Return to normal activities tomorrow.                            Written discharge instructions were provided to the                            patient.                           - Resume previous diet.                           - Continue present medications.                           You will receive a letter within 2-3 weeks with the                            pathology results and my final recommendations.                           If the polyp(s) is proven to be 'pre-cancerous' on                            pathology, you will need repeat colonoscopy in 6-12                            months given the piecemeal resection needed to                            removed the largest polyp today. Milus Banister, MD 08/28/2016 2:30:05 PM This report has been signed electronically.

## 2016-08-28 NOTE — Progress Notes (Signed)
Pt. Reports no change in surgical or medical history since pre-visit 08/10/2016.

## 2016-08-29 ENCOUNTER — Telehealth: Payer: Self-pay

## 2016-08-29 NOTE — Telephone Encounter (Signed)
  Follow up Call-  Call back number 08/28/2016  Post procedure Call Back phone  # [phone]  Permission to leave phone message Yes  Some recent data might be hidden     Patient questions:  Do you have a fever, pain , or abdominal swelling? No. Pain Score  0 *  Have you tolerated food without any problems? Yes.    Have you been able to return to your normal activities? Yes.    Do you have any questions about your discharge instructions: Diet   No. Medications  No. Follow up visit  No.  Do you have questions or concerns about your Care? No.  Actions: * If pain score is 4 or above: No action needed, pain <4.  No problems noted per pt. maw

## 2016-08-31 ENCOUNTER — Encounter: Payer: Self-pay | Admitting: Gastroenterology

## 2016-11-07 ENCOUNTER — Encounter: Payer: Self-pay | Admitting: Internal Medicine

## 2016-11-07 ENCOUNTER — Telehealth: Payer: Self-pay | Admitting: *Deleted

## 2016-11-07 ENCOUNTER — Ambulatory Visit (INDEPENDENT_AMBULATORY_CARE_PROVIDER_SITE_OTHER): Payer: Medicare HMO | Admitting: Internal Medicine

## 2016-11-07 VITALS — BP 105/79 | HR 92 | Temp 98.2°F | Ht 71.0 in | Wt 153.0 lb

## 2016-11-07 DIAGNOSIS — G8929 Other chronic pain: Secondary | ICD-10-CM

## 2016-11-07 DIAGNOSIS — M545 Low back pain: Secondary | ICD-10-CM

## 2016-11-07 DIAGNOSIS — Z0000 Encounter for general adult medical examination without abnormal findings: Secondary | ICD-10-CM

## 2016-11-07 DIAGNOSIS — I739 Peripheral vascular disease, unspecified: Secondary | ICD-10-CM | POA: Diagnosis not present

## 2016-11-07 DIAGNOSIS — K649 Unspecified hemorrhoids: Secondary | ICD-10-CM | POA: Diagnosis not present

## 2016-11-07 DIAGNOSIS — I10 Essential (primary) hypertension: Secondary | ICD-10-CM

## 2016-11-07 DIAGNOSIS — Z23 Encounter for immunization: Secondary | ICD-10-CM | POA: Diagnosis not present

## 2016-11-07 MED ORDER — NAPROXEN 500 MG PO TBEC: 500.0000 mg | DELAYED_RELEASE_TABLET | Freq: Two times a day (BID) | ORAL | 0 refills | Status: DC

## 2016-11-07 MED ORDER — HYDROCORTISONE ACETATE 25 MG RE SUPP: 25.0000 mg | Freq: Two times a day (BID) | RECTAL | 0 refills | Status: DC

## 2016-11-07 MED ORDER — NAPROXEN 500 MG PO TBEC: 500.0000 mg | DELAYED_RELEASE_TABLET | Freq: Two times a day (BID) | ORAL | 0 refills | Status: DC | PRN

## 2016-11-07 NOTE — Telephone Encounter (Signed)
[facility] calls and states supp ordered are over 100.00 for 12 and require PA, change to regular anusol supp Naproxen to reg not enteric coated, directions changed to 1 tablet twice daily w/ a meal AS NEEDED

## 2016-11-07 NOTE — Assessment & Plan Note (Signed)
BP Readings from Last 3 Encounters:  11/07/16 105/79  08/28/16 (!) 156/74  08/10/16 110/64   BP well controlled. - Continue HCTZ 25 mg daily - Continue Amlodipine 10 mg daily

## 2016-11-07 NOTE — Progress Notes (Signed)
CC: Back pain  HPI:  [name] is a 66 y.o. male with PMH as listed below including HTN, HLD, CVA, PVD with claudication who presents for follow up management of his HTN, back pain, and hemorrhoids.  Chronic Back Pain with Lumbar Strain: Patient with chronic back pain with several lumbar laminectomies in the past for disk herniations. Most recent surgery was in April 2006 for re-exploration, microdiskectomy, right L5-S1, removal of foraminal herniated nucleus pulposis performed by Dr. Lorin Mercy. Patient has intermittent flare ups of back pain which have resolved with short courses of as needed NSAIDs. He feels like his back pain has flared up again. He denies any focal weakness or incontinence.  HTN: Patient currently takes HCTZ 25 mg daily and Amlodipine 10 mg daily to which he reports adherence. BP today is 105/79.  Peripheral Vascular Disease: Patient currently takes Pletal for claudication with relief. He also takes Plavix for secondary prevention after prior CVA. He is on high-intensity Atorvastatin 80 mg daily. He reports adherence to these medications. He denies any recent claudication symptoms.  Hemorrhoids: Patient reports rectal discomfort with occasional spotting of blood on toilet paper without blood dripping into toilet, mixed into stool, or melena. He has a history of external and internal hemorrhoids. He had a repeat colonoscopy on 08/28/16 which revealed multiple polyps and internal hemorrhoids. Pathology showed tubular adenomas and hyperplastic polyps without evidence of malignancy. He is anticipated to have repeat colonoscopy in 6 months.   Healthcare Maintenance: Patient agreeable for flu shot today.  Past Medical History:  Diagnosis Date  . Alcohol abuse   . Arthropathy    shoulder  . Chest pain   . Chronic cough   . Foot pain    bilateral  . Hyperlipidemia   . Hypertension   . Intermittent claudication (HCC)    bilateral  . Latent tuberculosis   . Lumbago     . Shoulder pain    Review of Systems:   Review of Systems  Constitutional: Negative for chills and fever.  HENT: Negative for nosebleeds.   Respiratory: Negative for cough, hemoptysis and shortness of breath.   Cardiovascular: Negative for chest pain.  Gastrointestinal: Negative for abdominal pain, blood in stool, constipation, diarrhea, melena, nausea and vomiting.  Genitourinary: Negative for dysuria, hematuria and urgency.  Musculoskeletal: Positive for back pain. Negative for falls.  Neurological: Negative for dizziness, focal weakness and loss of consciousness.       No incontinence     Physical Exam:  Vitals:   11/07/16 1422  BP: 105/79  Pulse: 92  Temp: 98.2 F (36.8 C)  TempSrc: Oral  SpO2: 100%  Weight: 153 lb (69.4 kg)  Height: 5\' 11"  (1.803 m)   Physical Exam  Constitutional: He is oriented to person, place, and time. He appears well-nourished. No distress.  HENT:  Head: Normocephalic and atraumatic.  Cardiovascular: Normal rate and regular rhythm.   Pulmonary/Chest: Effort normal. No respiratory distress. He has no wheezes.  Abdominal: Soft. There is no tenderness.  Musculoskeletal: He exhibits no edema.       Cervical back: He exhibits no tenderness.       Thoracic back: He exhibits no tenderness.       Lumbar back: He exhibits no tenderness.  Strength 5/5 bilateral LEs.  Neurological: He is alert and oriented to person, place, and time.  Skin: Skin is warm. He is not diaphoretic.    Assessment & Plan:   See Encounters Tab for problem based charting.  Patient discussed with Dr. Evette Doffing  Chronic back pain s/p L5-S1 laminectomy Patient reports having a mild flare up of his chronic back pain for which he is using heat and occasional tylenol. He denies any focal weakness or bowel/bladder incontinence. Will recommend he try Naproxen 500 mg only as needed twice daily with food. Continue activity as tolerated.  Essential hypertension BP Readings from  Last 3 Encounters:  11/07/16 105/79  08/28/16 (!) 156/74  08/10/16 110/64   BP well controlled. - Continue HCTZ 25 mg daily - Continue Amlodipine 10 mg daily  Hemorrhoids Patient reports rectal discomfort which he feels is similar to his prior hemorrhoidal pain. He has been using Preparation H without significant relief. He says he cannot try sitz baths as he has a hard time getting up out of the tub due to his chronic back pain. He has seen spotty blood on toilet paper at times but no bloody stool or melena. Would not expect pain from internal hemorrhoids however he does have history of external hemorrhoids seen on a previous exam by me. Repeat rectal exam was not performed today.   Will try Anusol suppositories for further relief. If no improvement will need to consider fissure as possible cause of his symptoms.  Healthcare maintenance Flu shot given today.  Need for immunization against influenza Flu shot given today.

## 2016-11-07 NOTE — Telephone Encounter (Signed)
Thank you :)

## 2016-11-07 NOTE — Assessment & Plan Note (Signed)
Flu shot given today

## 2016-11-07 NOTE — Patient Instructions (Addendum)
It was a pleasure to see you again Stephen [name].  We will try Anusol Suppositories for your hemorrhoids. Please follow up with your GI doctor for your repeat colonoscopy.  We will try Naproxen for your back pain. Take this with food. Please let us know if you notice any bleeding or stomach pain after you start this.  We are giving you the flu shot today.  Please follow up with me in 6 months or sooner if needed.   How to Take a Sitz Bath A sitz bath is a warm water bath that is taken while you are sitting down. The water should only come up to your hips and should cover your buttocks. Your health care provider may recommend a sitz bath to help you:  Clean the lower part of your body, including your genital area.  With itching.  With pain.  With sore muscles or muscles that tighten or spasm.  How to take a sitz bath Take 3-4 sitz baths per day or as told by your health care provider. 1. Partially fill a bathtub with warm water. You will only need the water to be deep enough to cover your hips and buttocks when you are sitting in it. 2. If your health care provider told you to put medicine in the water, follow the directions exactly. 3. Sit in the water and open the tub drain a little. 4. Turn on the warm water again to keep the tub at the correct level. Keep the water running constantly. 5. Soak in the water for 15-20 minutes or as told by your health care provider. 6. After the sitz bath, pat the affected area dry first. Do not rub it. 7. Be careful when you stand up after the sitz bath because you may feel dizzy.  Contact a health care provider if:  Your symptoms get worse. Do not continue with sitz baths if your symptoms get worse.  You have new symptoms. Do not continue with sitz baths until you talk with your health care provider. This information is not intended to replace advice given to you by your health care provider. Make sure you discuss any questions you have with your  health care provider. Document Released: 10/08/2003 Document Revised: 06/15/2015 Document Reviewed: 01/13/2014 Elsevier Interactive Patient Education  [employer].

## 2016-11-07 NOTE — Assessment & Plan Note (Signed)
Patient reports having a mild flare up of his chronic back pain for which he is using heat and occasional tylenol. He denies any focal weakness or bowel/bladder incontinence. Will recommend he try Naproxen 500 mg only as needed twice daily with food. Continue activity as tolerated.

## 2016-11-07 NOTE — Assessment & Plan Note (Signed)
Patient reports rectal discomfort which he feels is similar to his prior hemorrhoidal pain. He has been using Preparation H without significant relief. He says he cannot try sitz baths as he has a hard time getting up out of the tub due to his chronic back pain. He has seen spotty blood on toilet paper at times but no bloody stool or melena. Would not expect pain from internal hemorrhoids however he does have history of external hemorrhoids seen on a previous exam by me. Repeat rectal exam was not performed today.   Will try Anusol suppositories for further relief. If no improvement will need to consider fissure as possible cause of his symptoms.

## 2016-11-08 NOTE — Progress Notes (Signed)
Internal Medicine Clinic Attending  Case discussed with Dr. Patel at the time of the visit.  We reviewed the resident's history and exam and pertinent patient test results.  I agree with the assessment, diagnosis, and plan of care documented in the resident's note.  

## 2016-11-29 ENCOUNTER — Other Ambulatory Visit: Payer: Self-pay | Admitting: Internal Medicine

## 2017-01-02 ENCOUNTER — Other Ambulatory Visit: Payer: Self-pay | Admitting: Internal Medicine

## 2017-01-02 NOTE — Telephone Encounter (Signed)
Next appt scheduled  05/08/17 with PCP.

## 2017-02-01 ENCOUNTER — Other Ambulatory Visit: Payer: Self-pay | Admitting: Internal Medicine

## 2017-03-05 ENCOUNTER — Other Ambulatory Visit: Payer: Self-pay | Admitting: Internal Medicine

## 2017-03-05 ENCOUNTER — Encounter: Payer: Self-pay | Admitting: Gastroenterology

## 2017-03-05 NOTE — Telephone Encounter (Signed)
Next appt scheduled  05/08/17 with PCP.

## 2017-04-09 ENCOUNTER — Other Ambulatory Visit: Payer: Self-pay | Admitting: Internal Medicine

## 2017-05-08 ENCOUNTER — Encounter: Payer: Medicare HMO | Admitting: Internal Medicine

## 2017-05-08 ENCOUNTER — Other Ambulatory Visit: Payer: Self-pay | Admitting: Internal Medicine

## 2017-05-08 DIAGNOSIS — I10 Essential (primary) hypertension: Secondary | ICD-10-CM

## 2017-06-19 ENCOUNTER — Other Ambulatory Visit: Payer: Self-pay

## 2017-06-19 ENCOUNTER — Ambulatory Visit (INDEPENDENT_AMBULATORY_CARE_PROVIDER_SITE_OTHER): Payer: Medicare HMO | Admitting: Internal Medicine

## 2017-06-19 ENCOUNTER — Other Ambulatory Visit: Payer: Self-pay | Admitting: Internal Medicine

## 2017-06-19 ENCOUNTER — Encounter: Payer: Self-pay | Admitting: Internal Medicine

## 2017-06-19 VITALS — BP 126/71 | HR 72 | Temp 98.1°F | Ht 71.0 in | Wt 156.7 lb

## 2017-06-19 DIAGNOSIS — E876 Hypokalemia: Secondary | ICD-10-CM

## 2017-06-19 DIAGNOSIS — I10 Essential (primary) hypertension: Secondary | ICD-10-CM

## 2017-06-19 DIAGNOSIS — Z7902 Long term (current) use of antithrombotics/antiplatelets: Secondary | ICD-10-CM

## 2017-06-19 DIAGNOSIS — E785 Hyperlipidemia, unspecified: Secondary | ICD-10-CM

## 2017-06-19 DIAGNOSIS — I739 Peripheral vascular disease, unspecified: Secondary | ICD-10-CM

## 2017-06-19 DIAGNOSIS — G8929 Other chronic pain: Secondary | ICD-10-CM | POA: Diagnosis not present

## 2017-06-19 DIAGNOSIS — M545 Low back pain: Secondary | ICD-10-CM | POA: Diagnosis not present

## 2017-06-19 DIAGNOSIS — Z79899 Other long term (current) drug therapy: Secondary | ICD-10-CM | POA: Diagnosis not present

## 2017-06-19 DIAGNOSIS — Z9889 Other specified postprocedural states: Secondary | ICD-10-CM

## 2017-06-19 DIAGNOSIS — Z8673 Personal history of transient ischemic attack (TIA), and cerebral infarction without residual deficits: Secondary | ICD-10-CM

## 2017-06-19 DIAGNOSIS — Z791 Long term (current) use of non-steroidal anti-inflammatories (NSAID): Secondary | ICD-10-CM | POA: Diagnosis not present

## 2017-06-19 MED ORDER — GABAPENTIN 300 MG PO CAPS: 300.0000 mg | ORAL_CAPSULE | Freq: Three times a day (TID) | ORAL | 3 refills | Status: DC

## 2017-06-19 NOTE — Progress Notes (Signed)
CC: Back pain  HPI:  [name] is a 67 y.o. male with past medical history as listed below including hypertension, hyperlipidemia, CVA, PVD with claudication who presents for follow-up management of his hypertension and back pain.  Chronic back pain s/p L5-S1 laminectomy He has chronic back pain with prior lumbar laminectomies performed for disc herniations.  His most recent surgery was in April 2006 for reexploration, microdiscectomy, right L5-S1, removal of foraminal herniated nucleus pulposus performed by Dr. Lorin Mercy.  He has intermittent flareups of his back pain for which she is using as needed naproxen up to twice a day with some relief.  He reports recent feelings of a shooting pain from his lower back down to the back of his left thigh.  He denies any bowel/bladder incontinence or new weakness in his legs. A/P: He is having continued back pain now with suspected left-sided radiculopathy of his left lower extremity.  Straight leg test is positive on the left.  We will start gabapentin 300 mg 3 times daily and continue his naproxen 500 mg twice daily only as needed.  Offered physical therapy referral however he says he would not be able to afford this.  We will continue GI prophylaxis with pantoprazole.  We will need to monitor closely for any signs or symptoms of bleeding.  Peripheral vascular disease He is currently taking Pletal 100 mg p.o. twice daily for claudication which is currently stable.  He is also taking Plavix for secondary stroke prevention. A/P: His claudication secondary to PVD is currently stable -Continue Pletal 100 mg p.o. twice daily -Continue Plavix 75 mg once daily -Continue high intensity atorvastatin 80 mg daily  Essential hypertension Is currently prescribed and reports adherence to amlodipine 10 mg daily and HCTZ 25 mg daily.  A/P:  BP Readings from Last 3 Encounters:  06/19/17 126/71  11/07/16 105/79  08/28/16 (!) 156/74   His blood pressure is  currently well controlled.  Repeat BMP shows stable renal function with mild hypokalemia.  Potassium supplementation 40 mEq x 2 doses is ordered.  I have discussed the results with patient by phone and he expresses understanding. -Continue HCTZ 25 mg daily -Continue amlodipine 10 mg daily -Supplement potassium    Past Medical History:  Diagnosis Date  . Alcohol abuse   . Arthropathy    shoulder  . Chest pain   . Chronic cough   . Foot pain    bilateral  . Hyperlipidemia   . Hypertension   . Intermittent claudication (HCC)    bilateral  . Latent tuberculosis   . Lumbago   . Shoulder pain    Review of Systems:   Review of Systems  HENT: Negative for nosebleeds.   Respiratory: Negative for hemoptysis and shortness of breath.   Cardiovascular: Negative for chest pain and leg swelling.  Gastrointestinal: Negative for blood in stool and melena.  Genitourinary: Negative for dysuria and hematuria.  Musculoskeletal: Positive for back pain.     Physical Exam:  Vitals:   06/19/17 1451  BP: 126/71  Pulse: 72  Temp: 98.1 F (36.7 C)  TempSrc: Oral  SpO2: 100%  Weight: 156 lb 11.2 oz (71.1 kg)  Height: 5\' 11"  (1.803 m)   Physical Exam  Constitutional: He is oriented to person, place, and time. No distress.  Ambulates with cane  HENT:  Head: Normocephalic and atraumatic.  Cardiovascular: Normal rate and regular rhythm.  No murmur heard. Pulmonary/Chest: Effort normal. No respiratory distress. He has no wheezes. He has  no rales.  Musculoskeletal: He exhibits no edema.       Lumbar back: He exhibits tenderness.  Straight leg test positive on the left  Neurological: He is alert and oriented to person, place, and time.  Skin: He is not diaphoretic.     Assessment & Plan:   See Encounters Tab for problem based charting.  Patient discussed with Dr. Dareen Piano

## 2017-06-19 NOTE — Patient Instructions (Signed)
It was a pleasure to see you again Stephen [name].  We will start Gabapentin 300 mg three times a day for your back and nerve pain. Ask the pharmacist how much this costs. If it is too expensive, do not pick it up.  Please continue your other medications as prescribed and consider the Pneumonia vaccine on your next visit.  Please follow up with [place] again in 6 months or sooner if needed.

## 2017-06-20 LAB — BMP8+ANION GAP
ANION GAP: 17 mmol/L (ref 10.0–18.0)
BUN / CREAT RATIO: 13 (ref 10–24)
BUN: 14 mg/dL (ref 8–27)
CO2: 26 mmol/L (ref 20–29)
CREATININE: 1.07 mg/dL (ref 0.76–1.27)
Calcium: 9.8 mg/dL (ref 8.6–10.2)
Chloride: 96 mmol/L (ref 96–106)
GFR calc Af Amer: 82 mL/min/{1.73_m2} (ref >59)
GFR, EST NON AFRICAN AMERICAN: 71 mL/min/{1.73_m2} (ref >59)
Glucose: 86 mg/dL (ref 65–99)
Potassium: 3.2 mmol/L — ABNORMAL LOW (ref 3.5–5.2)
Sodium: 139 mmol/L (ref 134–144)

## 2017-06-20 MED ORDER — POTASSIUM CHLORIDE CRYS ER 20 MEQ PO TBCR: 40.0000 meq | EXTENDED_RELEASE_TABLET | Freq: Two times a day (BID) | ORAL | 0 refills | Status: DC

## 2017-06-20 NOTE — Assessment & Plan Note (Signed)
He has chronic back pain with prior lumbar laminectomies performed for disc herniations.  His most recent surgery was in April 2006 for reexploration, microdiscectomy, right L5-S1, removal of foraminal herniated nucleus pulposus performed by Dr. Lorin Mercy.  He has intermittent flareups of his back pain for which she is using as needed naproxen up to twice a day with some relief.  He reports recent feelings of a shooting pain from his lower back down to the back of his left thigh.  He denies any bowel/bladder incontinence or new weakness in his legs. A/P: He is having continued back pain now with suspected left-sided radiculopathy of his left lower extremity.  Straight leg test is positive on the left.  We will start gabapentin 300 mg 3 times daily and continue his naproxen 500 mg twice daily only as needed.  Offered physical therapy referral however he says he would not be able to afford this.  We will continue GI prophylaxis with pantoprazole.  We will need to monitor closely for any signs or symptoms of bleeding.

## 2017-06-20 NOTE — Assessment & Plan Note (Addendum)
Is currently prescribed and reports adherence to amlodipine 10 mg daily and HCTZ 25 mg daily.  A/P:  BP Readings from Last 3 Encounters:  06/19/17 126/71  11/07/16 105/79  08/28/16 (!) 156/74   His blood pressure is currently well controlled.  Repeat BMP shows stable renal function with mild hypokalemia.  Potassium supplementation 40 mEq x 2 doses is ordered.  I have discussed the results with patient by phone and he expresses understanding. -Continue HCTZ 25 mg daily -Continue amlodipine 10 mg daily -Supplement potassium

## 2017-06-20 NOTE — Assessment & Plan Note (Signed)
He is currently taking Pletal 100 mg p.o. twice daily for claudication which is currently stable.  He is also taking Plavix for secondary stroke prevention. A/P: His claudication secondary to PVD is currently stable -Continue Pletal 100 mg p.o. twice daily -Continue Plavix 75 mg once daily -Continue high intensity atorvastatin 80 mg daily

## 2017-06-21 NOTE — Progress Notes (Signed)
Internal Medicine Clinic Attending  Case discussed with Dr. Patel at the time of the visit.  We reviewed the resident's history and exam and pertinent patient test results.  I agree with the assessment, diagnosis, and plan of care documented in the resident's note.  

## 2017-08-20 ENCOUNTER — Encounter: Payer: Self-pay | Admitting: *Deleted

## 2017-09-05 ENCOUNTER — Other Ambulatory Visit: Payer: Self-pay | Admitting: Internal Medicine

## 2017-12-04 ENCOUNTER — Encounter: Payer: Self-pay | Admitting: Internal Medicine

## 2017-12-04 ENCOUNTER — Other Ambulatory Visit: Payer: Self-pay

## 2017-12-04 ENCOUNTER — Ambulatory Visit (INDEPENDENT_AMBULATORY_CARE_PROVIDER_SITE_OTHER): Payer: Medicare HMO | Admitting: Internal Medicine

## 2017-12-04 VITALS — BP 131/62 | HR 94 | Temp 98.5°F | Ht 71.0 in | Wt 161.0 lb

## 2017-12-04 DIAGNOSIS — E785 Hyperlipidemia, unspecified: Secondary | ICD-10-CM | POA: Diagnosis not present

## 2017-12-04 DIAGNOSIS — M5416 Radiculopathy, lumbar region: Secondary | ICD-10-CM | POA: Diagnosis not present

## 2017-12-04 DIAGNOSIS — E876 Hypokalemia: Secondary | ICD-10-CM | POA: Diagnosis not present

## 2017-12-04 DIAGNOSIS — I10 Essential (primary) hypertension: Secondary | ICD-10-CM | POA: Diagnosis not present

## 2017-12-04 DIAGNOSIS — K219 Gastro-esophageal reflux disease without esophagitis: Secondary | ICD-10-CM

## 2017-12-04 DIAGNOSIS — I739 Peripheral vascular disease, unspecified: Secondary | ICD-10-CM

## 2017-12-04 DIAGNOSIS — I69351 Hemiplegia and hemiparesis following cerebral infarction affecting right dominant side: Secondary | ICD-10-CM | POA: Diagnosis not present

## 2017-12-04 DIAGNOSIS — Z7902 Long term (current) use of antithrombotics/antiplatelets: Secondary | ICD-10-CM | POA: Diagnosis not present

## 2017-12-04 DIAGNOSIS — D126 Benign neoplasm of colon, unspecified: Secondary | ICD-10-CM | POA: Diagnosis not present

## 2017-12-04 DIAGNOSIS — Z79899 Other long term (current) drug therapy: Secondary | ICD-10-CM

## 2017-12-04 DIAGNOSIS — M79601 Pain in right arm: Secondary | ICD-10-CM

## 2017-12-04 DIAGNOSIS — Z0000 Encounter for general adult medical examination without abnormal findings: Secondary | ICD-10-CM

## 2017-12-04 MED ORDER — PANTOPRAZOLE SODIUM 20 MG PO TBEC: DELAYED_RELEASE_TABLET | ORAL | 3 refills | Status: DC

## 2017-12-04 MED ORDER — CLOPIDOGREL BISULFATE 75 MG PO TABS: ORAL_TABLET | ORAL | 11 refills | Status: DC

## 2017-12-04 MED ORDER — NAPROXEN 500 MG PO TABS: ORAL_TABLET | ORAL | 2 refills | Status: DC

## 2017-12-04 MED ORDER — VOLTAREN 1 % TD GEL: TRANSDERMAL | 0 refills | Status: DC

## 2017-12-04 MED ORDER — AMLODIPINE BESYLATE 10 MG PO TABS: ORAL_TABLET | ORAL | 3 refills | Status: DC

## 2017-12-04 MED ORDER — CILOSTAZOL 100 MG PO TABS: ORAL_TABLET | ORAL | 6 refills | Status: DC

## 2017-12-04 MED ORDER — GABAPENTIN 400 MG PO CAPS: 400.0000 mg | ORAL_CAPSULE | Freq: Three times a day (TID) | ORAL | 3 refills | Status: DC

## 2017-12-04 MED ORDER — HYDROCHLOROTHIAZIDE 25 MG PO TABS: ORAL_TABLET | ORAL | 3 refills | Status: DC

## 2017-12-04 NOTE — Progress Notes (Signed)
CC: Follow-up of hypertension  HPI:  [name] is a 67 y.o. male with a history of hypertension, peripheral vascular disease, GERD, hyperlipidemia, history of CVA who presents for follow-up of hypertension.  Hypertension: He is currently on amlodipine 10 mg daily and hydrochlorothiazide 25 mg daily.  He reports good compliance.  Blood pressure today is 131/62.  Peripheral vascular disease: Stephen [name] currently takes cilastazol for lower extremity claudication. He denies any recent symptoms while on this medication.   Right arm and right-sided back pain: Stephen [name] reports intermittent right arm and right-sided back pain for the last several months. He has a history of CVA in 2015 with residual right-sided sensation and weakness.  His symptoms have been somewhat alleviated with the use of Voltaren gel and Naproxen 500 mg BID PRN. He is also taking Gabapentin 300 mg TID which was originally prescribed for left lower extremity radiculopathy with some alleviation of symptoms. He denies any other neurologic symptoms, fever, urinary incontinence, and changes in bowel movements.  History of CVA: He takes Plavix for secondary prevention.  He also takes atorvastatin 80 mg daily.  He reports good compliance with these medications.   Tubular Adenoma Colonoscopy performed on 08/28/2016 for evaluation of bright red blood per rectum.  Results included multiple polyps and internal hemorrhoids.  Pathology showed tubular adenomas and hyperplastic polyps without evidence of malignancy. He denies changes in bowel movements, melena, hematochezia, and abdominal pain.     GERD: His acid reflux is well controlled on pantoprazole 20 mg QD. He is requesting a prescription today.   Healthcare maintenance: Stephen [name] would like a Flu shot today.    Past Medical History:  Diagnosis Date  . Alcohol abuse   . Arthropathy    shoulder  . Chest pain   . Chronic cough   . Foot pain    bilateral  .  Hyperlipidemia   . Hypertension   . Intermittent claudication (HCC)    bilateral  . Latent tuberculosis   . Lumbago   . Shoulder pain    Review of Systems:   Review of Systems  Constitutional: Negative for chills, fever and weight loss.  HENT: Negative for congestion, sinus pain and sore throat.   Respiratory: Negative for cough, hemoptysis, sputum production and shortness of breath.   Cardiovascular: Negative for chest pain, palpitations, claudication and leg swelling.  Gastrointestinal: Negative for abdominal pain, blood in stool, constipation, diarrhea, melena and vomiting.  Genitourinary: Negative for dysuria, frequency, hematuria and urgency.  Skin: Negative for itching and rash.  Neurological: Negative for sensory change, weakness and headaches.    Physical Exam:  Vitals:   12/04/17 1606  BP: 131/62  Pulse: 94  Temp: 98.5 F (36.9 C)  SpO2: 99%  Weight: 161 lb (73 kg)  Height: 5\' 11"  (1.803 m)   Physical Exam  Constitutional: He is oriented to person, place, and time and well-developed, well-nourished, and in no distress.  HENT:  Head: Normocephalic.  Eyes: Conjunctivae and EOM are normal.  Neck: Normal range of motion. Neck supple.  Cardiovascular: Normal rate, regular rhythm and normal heart sounds.  Pulmonary/Chest: Effort normal and breath sounds normal.  Abdominal: Soft. Bowel sounds are normal. He exhibits no distension. There is no tenderness.  Musculoskeletal: Normal range of motion. He exhibits no edema or tenderness.  Paraspinal tenderness to lumbar area of back.  Neurological: He is alert and oriented to person, place, and time.  4/5 strength of right upper and lower extremities. 5/5 strength  of left upper and lower extremities. No changes in sensation of UE and LE bilaterally.   Skin: Skin is warm and dry.  Psychiatric: Affect and judgment normal.  Nursing note and vitals reviewed.   Assessment & Plan:   See Encounters Tab for problem based  charting.  Patient seen with Dr. Daryll Drown

## 2017-12-09 DIAGNOSIS — D126 Benign neoplasm of colon, unspecified: Secondary | ICD-10-CM | POA: Insufficient documentation

## 2017-12-09 DIAGNOSIS — M79601 Pain in right arm: Secondary | ICD-10-CM | POA: Insufficient documentation

## 2017-12-09 NOTE — Assessment & Plan Note (Signed)
Assessment: [name] has had intermittent hypokalemia since 2015 which is likely related to his HCTZ use. Last K 3.2 (5 months ago). He has been intermittently treated with K-dur 20 mEq daily.  Plan: 1. Repeat BMP today 2. Consider restarting potassium oral supplementation if K remains low.

## 2017-12-09 NOTE — Assessment & Plan Note (Signed)
Assessment: 2018 Colonoscopy showed tubular adenomas and hyperplastic polyps without evidence of malignancy. He will need a repeat colonoscopy in 5 years (2023) for his low risk tubular adenomas.  Plan: 1. Repeat Colonoscopy in 2023

## 2017-12-09 NOTE — Assessment & Plan Note (Signed)
Assessment: Well-controlled (no signs of LE claudication) while on cilostazol 100 mg BID.  Plan: 1. Continue cilostazol 100 mg BID. Refill sent.

## 2017-12-09 NOTE — Assessment & Plan Note (Addendum)
Assessment: Blood pressure well controlled on HCTZ 25 mg QD and Amlodipine 10 mg QD.   Plan: 1. Continue HCTZ 25 mg QD and Amlodipine 10 mg QD. 2. Refills sent

## 2017-12-09 NOTE — Assessment & Plan Note (Addendum)
Assessment/Plan: 1. Flu shot administered today 2. He has never had an A1c checked. Will screen for diabetes today.  ADDENDUM: A1c 5.7. WNL. No further testing needed.

## 2017-12-09 NOTE — Assessment & Plan Note (Signed)
Assessment: Acid reflux well controlled on pantoprazole 20 mg QD.  Plan: 1. Sent refill for pantoprazole 20 mg QD

## 2017-12-09 NOTE — Assessment & Plan Note (Signed)
Assessment: Stephen [name] has intermittent right arm and right-sided back pain which is likely secondary to residual right-sided weakness, spacticity, and pain from his previous CVA. His physical exam today is largely unremarkable other than decreased right-sided strength. His symptoms have been somewhat alleviated with the use of Voltaren gel and Naproxen 500 mg BID PRN. He is also taking Gabapentin 300 mg TID which was originally prescribed for left lower extremity radiculopathy with some alleviation of symptoms.  I will increase his dose to 400 mg TID to help alleviate his pain as well as send in a refill prescription for Voltaren gel and Naproxen 500 mg BID.  Plan: 1. Increase dose of Gabapentin to 400 mg TID 2. Sent refills of  Voltaren gel and Naproxen 500 mg BID.

## 2017-12-09 NOTE — Assessment & Plan Note (Deleted)
Assessment: Mr. Lanphere has intermittent right arm and right-sided back pain which is likely secondary to residual right-sided weakness, spacticity, and pain from his previous CVA. His physical exam today is largely unremarkable other than decreased right-sided strength. His symptoms have been somewhat alleviated with the use of Voltaren gel and Naproxen 500 mg BID PRN. He is also taking Gabapentin 300 mg TID which was originally prescribed for left lower extremity radiculopathy with some alleviation of symptoms.  I will increase his dose to 400 mg TID to help alleviate his pain as well as send in a refill prescription for Voltaren gel and Naproxen 500 mg BID.  Plan: 1. Increase dose of Gabapentin to 400 mg TID 2. Sent refills of  Voltaren gel and Naproxen 500 mg BID.

## 2017-12-11 ENCOUNTER — Other Ambulatory Visit (INDEPENDENT_AMBULATORY_CARE_PROVIDER_SITE_OTHER): Payer: Medicare HMO

## 2017-12-11 DIAGNOSIS — I10 Essential (primary) hypertension: Secondary | ICD-10-CM

## 2017-12-11 LAB — GLUCOSE, CAPILLARY: GLUCOSE-CAPILLARY: 125 mg/dL — AB (ref 70–99)

## 2017-12-11 LAB — POCT GLYCOSYLATED HEMOGLOBIN (HGB A1C): HEMOGLOBIN A1C: 5.7 % — AB (ref 4.0–5.6)

## 2017-12-11 NOTE — Progress Notes (Signed)
Internal Medicine Clinic Attending  I saw and evaluated the patient.  I personally confirmed the key portions of the history and exam documented by Dr. Prince and I reviewed pertinent patient test results.  The assessment, diagnosis, and plan were formulated together and I agree with the documentation in the resident's note.  

## 2017-12-12 LAB — BMP8+ANION GAP
ANION GAP: 20 mmol/L — AB (ref 10.0–18.0)
BUN/Creatinine Ratio: 12 (ref 10–24)
BUN: 15 mg/dL (ref 8–27)
CALCIUM: 10.3 mg/dL — AB (ref 8.6–10.2)
CO2: 24 mmol/L (ref 20–29)
Chloride: 97 mmol/L (ref 96–106)
Creatinine, Ser: 1.25 mg/dL (ref 0.76–1.27)
GFR calc Af Amer: 67 mL/min/{1.73_m2} (ref >59)
GFR calc non Af Amer: 58 mL/min/{1.73_m2} — ABNORMAL LOW (ref >59)
Glucose: 113 mg/dL — ABNORMAL HIGH (ref 65–99)
POTASSIUM: 3.7 mmol/L (ref 3.5–5.2)
Sodium: 141 mmol/L (ref 134–144)

## 2017-12-27 ENCOUNTER — Other Ambulatory Visit: Payer: Self-pay

## 2017-12-27 ENCOUNTER — Emergency Department (HOSPITAL_COMMUNITY): Payer: Medicare HMO

## 2017-12-27 ENCOUNTER — Emergency Department (HOSPITAL_COMMUNITY)
Admission: EM | Admit: 2017-12-27 | Discharge: 2017-12-27 | Disposition: A | Discharge status: Home / Self Care | Payer: Medicare HMO | Attending: Emergency Medicine | Admitting: Emergency Medicine

## 2017-12-27 ENCOUNTER — Encounter (HOSPITAL_COMMUNITY): Payer: Self-pay | Admitting: Emergency Medicine

## 2017-12-27 DIAGNOSIS — M9983 Other biomechanical lesions of lumbar region: Secondary | ICD-10-CM

## 2017-12-27 DIAGNOSIS — Z87891 Personal history of nicotine dependence: Secondary | ICD-10-CM | POA: Diagnosis not present

## 2017-12-27 DIAGNOSIS — Z8673 Personal history of transient ischemic attack (TIA), and cerebral infarction without residual deficits: Secondary | ICD-10-CM | POA: Insufficient documentation

## 2017-12-27 DIAGNOSIS — M4807 Spinal stenosis, lumbosacral region: Secondary | ICD-10-CM | POA: Diagnosis not present

## 2017-12-27 DIAGNOSIS — I10 Essential (primary) hypertension: Secondary | ICD-10-CM | POA: Diagnosis not present

## 2017-12-27 DIAGNOSIS — M5126 Other intervertebral disc displacement, lumbar region: Secondary | ICD-10-CM | POA: Diagnosis not present

## 2017-12-27 DIAGNOSIS — Z79899 Other long term (current) drug therapy: Secondary | ICD-10-CM | POA: Diagnosis not present

## 2017-12-27 DIAGNOSIS — E785 Hyperlipidemia, unspecified: Secondary | ICD-10-CM | POA: Diagnosis not present

## 2017-12-27 DIAGNOSIS — M545 Low back pain: Secondary | ICD-10-CM | POA: Diagnosis not present

## 2017-12-27 DIAGNOSIS — R1084 Generalized abdominal pain: Secondary | ICD-10-CM | POA: Diagnosis present

## 2017-12-27 DIAGNOSIS — Z7902 Long term (current) use of antithrombotics/antiplatelets: Secondary | ICD-10-CM | POA: Insufficient documentation

## 2017-12-27 DIAGNOSIS — K439 Ventral hernia without obstruction or gangrene: Secondary | ICD-10-CM | POA: Diagnosis not present

## 2017-12-27 LAB — CBC WITH DIFFERENTIAL/PLATELET
ABS IMMATURE GRANULOCYTES: 0.05 10*3/uL (ref 0.00–0.07)
BASOS PCT: 1 %
Basophils Absolute: 0.1 10*3/uL (ref 0.0–0.1)
Eosinophils Absolute: 0.3 10*3/uL (ref 0.0–0.5)
Eosinophils Relative: 2 %
HCT: 39.3 % (ref 39.0–52.0)
Hemoglobin: 13.1 g/dL (ref 13.0–17.0)
Immature Granulocytes: 0 %
Lymphocytes Relative: 31 %
Lymphs Abs: 3.5 10*3/uL (ref 0.7–4.0)
MCH: 29 pg (ref 26.0–34.0)
MCHC: 33.3 g/dL (ref 30.0–36.0)
MCV: 87.1 fL (ref 80.0–100.0)
Monocytes Absolute: 1.1 10*3/uL — ABNORMAL HIGH (ref 0.1–1.0)
Monocytes Relative: 10 %
Neutro Abs: 6.3 10*3/uL (ref 1.7–7.7)
Neutrophils Relative %: 56 %
Platelets: 235 10*3/uL (ref 150–400)
RBC: 4.51 MIL/uL (ref 4.22–5.81)
RDW: 16.5 % — ABNORMAL HIGH (ref 11.5–15.5)
WBC: 11.2 10*3/uL — ABNORMAL HIGH (ref 4.0–10.5)
nRBC: 0 % (ref 0.0–0.2)

## 2017-12-27 LAB — COMPREHENSIVE METABOLIC PANEL
ALT: 20 U/L (ref 0–44)
AST: 20 U/L (ref 15–41)
Albumin: 4 g/dL (ref 3.5–5.0)
Alkaline Phosphatase: 73 U/L (ref 38–126)
Anion gap: 12 (ref 5–15)
BILIRUBIN TOTAL: 0.8 mg/dL (ref 0.3–1.2)
BUN: 16 mg/dL (ref 8–23)
CO2: 25 mmol/L (ref 22–32)
Calcium: 9.6 mg/dL (ref 8.9–10.3)
Chloride: 100 mmol/L (ref 98–111)
Creatinine, Ser: 1.11 mg/dL (ref 0.61–1.24)
GFR calc Af Amer: >60 mL/min (ref >60)
GFR calc non Af Amer: >60 mL/min (ref >60)
Glucose, Bld: 102 mg/dL — ABNORMAL HIGH (ref 70–99)
Potassium: 3.5 mmol/L (ref 3.5–5.1)
Sodium: 137 mmol/L (ref 135–145)
Total Protein: 7.4 g/dL (ref 6.5–8.1)

## 2017-12-27 LAB — URINALYSIS, ROUTINE W REFLEX MICROSCOPIC
Bilirubin Urine: NEGATIVE
Glucose, UA: NEGATIVE mg/dL
Hgb urine dipstick: NEGATIVE
Ketones, ur: NEGATIVE mg/dL
Leukocytes, UA: NEGATIVE
Nitrite: NEGATIVE
PH: 7 (ref 5.0–8.0)
Protein, ur: NEGATIVE mg/dL
Specific Gravity, Urine: 1.013 (ref 1.005–1.030)

## 2017-12-27 MED ORDER — HYDROMORPHONE HCL 1 MG/ML IJ SOLN
1.0000 mg | Freq: Once | INTRAMUSCULAR | Status: AC
  Administered 2017-12-27: 1 mg via INTRAVENOUS
  Filled 2017-12-27: qty 1

## 2017-12-27 MED ORDER — PREDNISONE 10 MG (21) PO TBPK: ORAL_TABLET | Freq: Every day | ORAL | 0 refills | Status: DC

## 2017-12-27 MED ORDER — HYDROCODONE-ACETAMINOPHEN 5-325 MG PO TABS: 1.0000 | ORAL_TABLET | ORAL | 0 refills | Status: DC | PRN

## 2017-12-27 MED ORDER — POTASSIUM CHLORIDE CRYS ER 20 MEQ PO TBCR
40.0000 meq | EXTENDED_RELEASE_TABLET | Freq: Once | ORAL | Status: AC
  Administered 2017-12-27: 40 meq via ORAL
  Filled 2017-12-27: qty 2

## 2017-12-27 MED ORDER — SODIUM CHLORIDE 0.9 % IV BOLUS
1000.0000 mL | Freq: Once | INTRAVENOUS | Status: AC
  Administered 2017-12-27: 1000 mL via INTRAVENOUS

## 2017-12-27 MED ORDER — PREDNISONE 20 MG PO TABS
60.0000 mg | ORAL_TABLET | Freq: Once | ORAL | Status: AC
  Administered 2017-12-27: 60 mg via ORAL
  Filled 2017-12-27: qty 3

## 2017-12-27 MED ORDER — LORAZEPAM 2 MG/ML IJ SOLN
1.0000 mg | Freq: Once | INTRAMUSCULAR | Status: AC | PRN
  Administered 2017-12-27: 1 mg via INTRAVENOUS
  Filled 2017-12-27: qty 1

## 2017-12-27 NOTE — ED Triage Notes (Signed)
Per pt he has had off and on left flank to lower back pain that radiates to his LLQ.  Pt able to walk but is very slow and comlains of increased pain on the left side. NAD noted at this time.

## 2017-12-27 NOTE — ED Provider Notes (Signed)
[facility] Provider Note   CSN: [id number] Arrival date & time: 12/27/17  1652     History   Chief Complaint Chief Complaint  Patient presents with  . Flank Pain    HPI Stephen [name] is a 67 y.o. male.  HPI 67 year old male presents with severe back pain.  Has been ongoing for about 2 or 3 weeks and worsening.  Primarily seems to start in the left flank/lower back and radiates across his anterior abdomen and to his right back.  However he primarily does not have abdominal pain.  No vomiting or fevers.  He has been taking the medicine prescribed to him which appears to be gabapentin.  However his pain is worsening and is currently an 8 out of 10.  He denies any weakness or numbness in his legs but the pain also does radiate down to his knees bilaterally.  Worse on the left. Feels like a sharp pain and sometimes a burning.  Past Medical History:  Diagnosis Date  . Alcohol abuse   . Arthropathy    shoulder  . Chest pain   . Chronic cough   . Foot pain    bilateral  . Hyperlipidemia   . Hypertension   . Intermittent claudication (HCC)    bilateral  . Latent tuberculosis   . Lumbago   . Shoulder pain     Patient Active Problem List   Diagnosis Date Noted  . Right arm and right-sided back pain 12/09/2017  . Tubular adenoma of colon 12/09/2017  . Hemorrhoids 07/06/2015  . Healthcare maintenance 12/19/2012  . Hypokalemia 09/19/2012  . History of CVA (cerebrovascular accident) 09/08/2012  . GERD (gastroesophageal reflux disease) 01/28/2012  . Chronic back pain s/p L5-S1 laminectomy 05/09/2011  . Peripheral vascular disease ([place]) 12/10/2006  . Hyperlipidemia 06/12/2006  . Essential hypertension 06/12/2006    Past Surgical History:  Procedure Laterality Date  . APPENDECTOMY    . HAMMER TOE SURGERY     right 2nd. and 5th  . LAMINECTOMY  [id number]   left lumbar         Home Medications    Prior to Admission medications     Medication Sig Start Date End Date Taking? Authorizing Provider  amLODipine (NORVASC) 10 MG tablet TAKE ONE (1) TABLET BY MOUTH EVERY DAY Patient taking differently: Take 10 mg by mouth daily.  12/04/17  Yes Carroll Sage, MD  atorvastatin (LIPITOR) 80 MG tablet TAKE ONE (1) TABLET BY MOUTH EVERY DAY AT [facility] EVENING Patient taking differently: Take 80 mg by mouth every evening.  02/04/17  Yes Lenore Cordia, MD  cilostazol (PLETAL) 100 MG tablet TAKE ONE (1) TABLET BY MOUTH TWO (2) TIMES DAILY Patient taking differently: Take 100 mg by mouth 2 (two) times daily.  12/04/17  Yes Carroll Sage, MD  gabapentin (NEURONTIN) 400 MG capsule Take 1 capsule (400 mg total) by mouth 3 (three) times daily. 12/04/17  Yes Carroll Sage, MD  hydrochlorothiazide (HYDRODIURIL) 25 MG tablet TAKE ONE (1) TABLET BY MOUTH EVERY DAY Patient taking differently: Take 25 mg by mouth daily.  12/04/17  Yes Carroll Sage, MD  naproxen (NAPROSYN) 500 MG tablet TAKE ONE (1) TABLET BY MOUTH TWO (2) TIMES DAILY WITH A MEAL AS NEEDED Patient taking differently: Take 500 mg by mouth 2 (two) times daily with a meal.  12/04/17  Yes Carroll Sage, MD  pantoprazole (PROTONIX) 20 MG tablet TAKE ONE (1) TABLET BY  MOUTH EVERY DAY Patient taking differently: Take 20 mg by mouth daily.  12/04/17  Yes Carroll Sage, MD  clopidogrel (PLAVIX) 75 MG tablet TAKE ONE (1) TABLET BY MOUTH EVERY DAY Patient taking differently: Take 75 mg by mouth daily.  12/04/17   Carroll Sage, MD  HYDROcodone-acetaminophen (NORCO) 5-325 MG tablet Take 1 tablet by mouth every 4 (four) hours as needed for severe pain. 12/27/17   Sherwood Gambler, MD  predniSONE (STERAPRED UNI-PAK 21 TAB) 10 MG (21) TBPK tablet Take by mouth daily. Take 6 tabs by mouth daily  for 1 days, then 5 tabs for 2 days, then 4 tabs for 2 days, then 3 tabs for 2 days, 2 tabs for 2 days, then 1 tab by mouth daily for 2 days 12/28/17   Sherwood Gambler, MD  VOLTAREN 1 % GEL APPLY 4  GRAMS TO THE AFFECTED AREA FOUR TIMES DAILY Patient not taking: Reported on 12/27/2017 12/04/17   Carroll Sage, MD    Family History Family History  Problem Relation Age of Onset  . Other Unknown        there is no specific hx of coronary arterial disease/He does not know his parents history  . Cancer Sister        unknown type    Social History Social History   Tobacco Use  . Smoking status: Former Smoker    Types: Cigarettes    Last attempt to quit: 01/29/1990    Years since quitting: 27.9  . Smokeless tobacco: Never Used  Substance Use Topics  . Alcohol use: No    Alcohol/week: 0.0 standard drinks    Comment: quit alcohol approximately 20 years ago  . Drug use: Yes    Frequency: 1.0 times per week    Types: Marijuana    Comment: marijuana use about once a month. Former cocaine use. quit in the 1980's     Allergies   Aspirin and Lisinopril   Review of Systems Review of Systems  Constitutional: Negative for fever.  Gastrointestinal: Negative for abdominal pain and vomiting.  Genitourinary: Positive for flank pain. Negative for dysuria and hematuria.  Musculoskeletal: Positive for back pain.  Neurological: Negative for weakness and numbness.  All other systems reviewed and are negative.    Physical Exam Updated Vital Signs BP (!) 142/88 (BP Location: Right Arm)   Pulse 76   Temp 97.6 F (36.4 C) (Oral)   Resp 18   Ht 5\' 11"  (1.803 m)   Wt 77.1 kg   SpO2 100%   BMI 23.71 kg/m   Physical Exam  Constitutional: He appears well-developed and well-nourished. He appears distressed (in pain).  HENT:  Head: Normocephalic and atraumatic.  Right Ear: External ear normal.  Left Ear: External ear normal.  Nose: Nose normal.  Eyes: Right eye exhibits no discharge. Left eye exhibits no discharge.  Neck: Neck supple.  Cardiovascular: Normal rate, regular rhythm and normal heart sounds.  Pulmonary/Chest: Effort normal and breath sounds normal.  Abdominal: Soft.  He exhibits no distension. There is no tenderness.  Musculoskeletal: He exhibits no edema.       Thoracic back: He exhibits no tenderness.       Lumbar back: He exhibits tenderness and bony tenderness.       Back:  Neurological: He is alert. He displays no Babinski's sign on the right side. He displays no Babinski's sign on the left side.  Reflex Scores:      Patellar reflexes are 3+ on  the right side and 3+ on the left side.      Achilles reflexes are 2+ on the right side and 2+ on the left side. 5/5 strength in BLE. Normal gross sensation  Skin: Skin is warm and dry. He is not diaphoretic.  Psychiatric: His mood appears not anxious.  Nursing note and vitals reviewed.    ED Treatments / Results  Labs (all labs ordered are listed, but only abnormal results are displayed) Labs Reviewed  COMPREHENSIVE METABOLIC PANEL - Abnormal; Notable for the following components:      Result Value   Glucose, Bld 102 (*)    All other components within normal limits  CBC WITH DIFFERENTIAL/PLATELET - Abnormal; Notable for the following components:   WBC 11.2 (*)    RDW 16.5 (*)    Monocytes Absolute 1.1 (*)    All other components within normal limits  URINALYSIS, ROUTINE W REFLEX MICROSCOPIC - Abnormal; Notable for the following components:   Color, Urine STRAW (*)    All other components within normal limits    EKG None  Radiology Mr Lumbar Spine Wo Contrast  Result Date: 12/27/2017 CLINICAL DATA:  Left flank and low back pain EXAM: MRI LUMBAR SPINE WITHOUT CONTRAST TECHNIQUE: Multiplanar, multisequence MR imaging of the lumbar spine was performed. No intravenous contrast was administered. COMPARISON:  Lumbar spine MRI 06/27/2004 FINDINGS: Segmentation: Normal. The lowest disc space is considered to be L5-S1. Alignment:  Normal Vertebrae: No acute compression fracture, discitis-osteomyelitis of focal marrow lesion. Conus medullaris and cauda equina: The conus medullaris terminates at the L1  level. The cauda equina and conus medullaris are both normal. Paraspinal and other soft tissues: The visualized aorta, IVC and iliac vessels are normal. The visualized retroperitoneal organs and paraspinal soft tissues are normal. Disc levels: Sagittal plane imaging includes the T10-11 disc level through the upper sacrum, with axial imaging of the L1-2 to L5-S1 disc levels. T10-11: Normal. T11-12: Disc desiccation without spinal canal or neural foraminal stenosis. T12-L1: Normal. L1-2: Normal. L2-3: Left eccentric disc bulge with narrowing of the left lateral recess. No spinal canal or neural foraminal stenosis. L3-4: Diffuse disc bulge with mild bilateral lateral recess narrowing. No spinal canal or neural foraminal stenosis. L4-5: Disc space narrowing with left eccentric disc bulge and superimposed left subarticular extrusion with inferior migration, causing left lateral recess stenosis and displacement of the descending left L5 nerve root. Mild right and moderate left neural foraminal stenosis. L5-S1: Right eccentric disc bulge with endplate spurring. Severe right neural foraminal stenosis. No spinal canal or left neural foraminal stenosis. Prior right laminectomy. The visualized portion of the sacrum is normal. IMPRESSION: 1. Severe right L5-S1 neural foraminal stenosis secondary to eccentric disc bulge and endplate osteophyte formation. 2. L4-5 disc bulge with superimposed left subarticular extrusion causing left lateral recess stenosis and displacement of the descending left L5 nerve root. There is also mild right and moderate left foraminal stenosis at this level. Electronically Signed   By: Ulyses Jarred M.D.   On: 12/27/2017 20:35   Ct Renal Stone Study  Result Date: 12/27/2017 CLINICAL DATA:  Flank pain EXAM: CT ABDOMEN AND PELVIS WITHOUT CONTRAST TECHNIQUE: Multidetector CT imaging of the abdomen and pelvis was performed following the standard protocol without IV contrast. COMPARISON:  CT 09/30/2010  FINDINGS: Lower chest: Lung bases demonstrate emphysema. No acute consolidation or effusion. Normal heart size. Hepatobiliary: No focal liver abnormality is seen. No gallstones, gallbladder wall thickening, or biliary dilatation. Pancreas: Unremarkable. No pancreatic ductal dilatation  or surrounding inflammatory changes. Spleen: Normal in size without focal abnormality. Adrenals/Urinary Tract: Adrenal glands are unremarkable. Kidneys are normal, without renal calculi, focal lesion, or hydronephrosis. Bladder is unremarkable. Stomach/Bowel: Stomach is within normal limits. No evidence of bowel wall thickening, distention, or inflammatory changes. Vascular/Lymphatic: Moderate aortic atherosclerosis. No aneurysm. No significantly enlarged lymph nodes. Reproductive: Prostate is unremarkable. Other: Negative for free air or free fluid. Small fat containing supraumbilical ventral hernia. Musculoskeletal: Degenerative changes without acute or suspicious abnormality IMPRESSION: 1. Negative for hydronephrosis or ureteral stone. 2. Basilar emphysema 3. Small fat containing supraumbilical ventral hernia Electronically Signed   By: Donavan Foil M.D.   On: 12/27/2017 18:04    Procedures Procedures (including critical care time)  Medications Ordered in ED Medications  sodium chloride 0.9 % bolus 1,000 mL (0 mLs Intravenous Stopped 12/27/17 1826)  HYDROmorphone (DILAUDID) injection 1 mg (1 mg Intravenous Given 12/27/17 1722)  LORazepam (ATIVAN) injection 1 mg (1 mg Intravenous Given 12/27/17 1941)  potassium chloride SA (K-DUR,KLOR-CON) CR tablet 40 mEq (40 mEq Oral Given 12/27/17 1841)  predniSONE (DELTASONE) tablet 60 mg (60 mg Oral Given 12/27/17 2110)     Initial Impression / Assessment and Plan / ED Course  I have reviewed the triage vital signs and the nursing notes.  Pertinent labs & imaging results that were available during my care of the patient were reviewed by me and considered in my medical  decision making (see chart for details).     Patient's pain has been controlled with an IV dose of pain medicine.  Pain is more likely back related than flank/intra-abdominal/retroperitoneal.  CT stone study benign.  Labs unremarkable.  MRI obtained given the 3+ reflexes and severe/uncontrolled pain.  Shows findings as above but no acute spinal emergency.  Given degree of pain I have ordered steroid taper and will give oral pain control.  I have discussed the need to follow-up with spinal surgery.  Otherwise he appears stable for discharge home.  We discussed strict return precautions.  Final Clinical Impressions(s) / ED Diagnoses   Final diagnoses:  Neuroforaminal stenosis of lumbosacral spine  Lumbar herniated disc    ED Discharge Orders         Ordered    predniSONE (STERAPRED UNI-PAK 21 TAB) 10 MG (21) TBPK tablet  Daily     12/27/17 2057    HYDROcodone-acetaminophen (NORCO) 5-325 MG tablet  Every 4 hours PRN     12/27/17 2057           Sherwood Gambler, MD 12/27/17 2259

## 2017-12-27 NOTE — Discharge Instructions (Addendum)
If you develop incontinence of your bowels or bladders, severe or uncontrolled back pain, vomiting, fever, weakness or numbness in your arms or legs, or any other new/concerning symptoms and return to the ER for evaluation.  You were given your first dose of steroids tonight, start the prescription steroids tomorrow.

## 2017-12-27 NOTE — ED Notes (Signed)
Patient transported to MRI 

## 2017-12-27 NOTE — ED Notes (Addendum)
Patient transported to CT 

## 2018-01-13 ENCOUNTER — Ambulatory Visit: Payer: Medicare HMO

## 2018-06-24 ENCOUNTER — Other Ambulatory Visit: Payer: Self-pay

## 2018-06-24 ENCOUNTER — Ambulatory Visit (INDEPENDENT_AMBULATORY_CARE_PROVIDER_SITE_OTHER): Payer: Medicare Other | Admitting: Internal Medicine

## 2018-06-24 DIAGNOSIS — M79601 Pain in right arm: Secondary | ICD-10-CM

## 2018-06-24 DIAGNOSIS — K219 Gastro-esophageal reflux disease without esophagitis: Secondary | ICD-10-CM

## 2018-06-24 DIAGNOSIS — Z9114 Patient's other noncompliance with medication regimen: Secondary | ICD-10-CM | POA: Diagnosis not present

## 2018-06-24 DIAGNOSIS — I10 Essential (primary) hypertension: Secondary | ICD-10-CM

## 2018-06-24 DIAGNOSIS — M545 Low back pain: Secondary | ICD-10-CM | POA: Diagnosis not present

## 2018-06-24 DIAGNOSIS — Z79899 Other long term (current) drug therapy: Secondary | ICD-10-CM

## 2018-06-24 MED ORDER — PANTOPRAZOLE SODIUM 20 MG PO TBEC: DELAYED_RELEASE_TABLET | ORAL | 3 refills | Status: DC

## 2018-06-24 MED ORDER — AMLODIPINE BESYLATE 10 MG PO TABS: ORAL_TABLET | ORAL | 3 refills | Status: DC

## 2018-06-24 MED ORDER — LIDOCAINE 4 % EX PTCH: 1.0000 | MEDICATED_PATCH | Freq: Every day | CUTANEOUS | 2 refills | Status: DC

## 2018-06-24 MED ORDER — GABAPENTIN 400 MG PO CAPS: 400.0000 mg | ORAL_CAPSULE | Freq: Three times a day (TID) | ORAL | 3 refills | Status: DC

## 2018-06-24 MED ORDER — NAPROXEN 500 MG PO TABS: ORAL_TABLET | ORAL | 0 refills | Status: DC

## 2018-06-24 MED ORDER — VOLTAREN 1 % TD GEL: TRANSDERMAL | 0 refills | Status: DC

## 2018-06-24 MED ORDER — HYDROCHLOROTHIAZIDE 25 MG PO TABS: ORAL_TABLET | ORAL | 3 refills | Status: DC

## 2018-06-24 MED ORDER — ATORVASTATIN CALCIUM 80 MG PO TABS: ORAL_TABLET | ORAL | 11 refills | Status: DC

## 2018-06-24 NOTE — Assessment & Plan Note (Signed)
Characteristics of his pain are most likely due to musculoskeletal.  Quite tenderness along ischial crest, might be due to tendinitis.  No red flags.  -Gave some naproxen 500 mg to be taken twice daily for 3 days then as needed.  Patient has a very low health literacy, gave him 15 pills only so he does not continue to take it regularly.  Patient is on Plavix also. -Gave him a new prescription of gabapentin to [facility] as he ran out of his previous meds. -Voltaren gel. -Lidocaine patch. -If his symptoms does not improve over the next couple of weeks-might get benefit with steroid injection.

## 2018-06-24 NOTE — Assessment & Plan Note (Signed)
BP Readings from Last 3 Encounters:  06/24/18 (!) 153/73  12/27/17 (!) 142/88  12/04/17 131/62   His blood pressure was elevated.  He has not taken his medications for about 2 weeks now, stating that he ran out of his meds and his pharmacy is closed.  -Continue current management-recent all his meds to [facility] as [facility] is closed.

## 2018-06-24 NOTE — Progress Notes (Signed)
   CC: Right-sided lower back pain for 2 weeks.  HPI:  [name] is a 68 y.o. with past medical history as listed below came to the clinic with complaint of right-sided lower back pain for 2 weeks. Patient has an history of similar pain in the past, stating that pain medications given to him before was helping but he ran out of all his meds for about 2 weeks now as his pharmacy is closed and he does not know from where to get his medications.  He was having nonradiating, 8/10 pain on right lower back around ischial crest area.  Denies any tingling, numbness or focal weakness.  Denies any saddle anesthesia or fecal or urinary incontinence.  Denies any urinary urgency or frequency.  No hematuria. He could not tell me any aggravating or elevating factors.  During night he struggled to find a good position to sleep.  Please see assessment and plan for his chronic conditions.  Past Medical History:  Diagnosis Date  . Alcohol abuse   . Arthropathy    shoulder  . Chest pain   . Chronic cough   . Foot pain    bilateral  . Hyperlipidemia   . Hypertension   . Intermittent claudication (HCC)    bilateral  . Latent tuberculosis   . Lumbago   . Shoulder pain    Review of Systems: Negative except mentioned in HPI.  Physical Exam:  Vitals:   06/24/18 1002 06/24/18 1004  BP:  (!) 153/73  Pulse:  65  Temp:  98.2 F (36.8 C)  TempSrc:  Oral  SpO2:  100%  Weight: 167 lb (75.8 kg)   Height: 5\' 11"  (1.803 m)     General: Vital signs reviewed.  Patient is well-developed and well-nourished, in no acute distress and cooperative with exam.  Head: Normocephalic and atraumatic. Eyes: EOMI, conjunctivae normal, no scleral icterus.  Cardiovascular: RRR, S1 normal, S2 normal, no murmurs, gallops, or rubs. Pulmonary/Chest: Clear to auscultation bilaterally, Abdominal: Soft, non-tender, non-distended, BS +, Musculoskeletal: Tenderness along right ischial crest area.  No erythema or edema.  Extremities: No lower extremity edema bilaterally,  pulses symmetric and intact bilaterally. No cyanosis or clubbing. Skin: Warm, dry and intact. No rashes or erythema. Psychiatric: Normal mood and affect. speech and behavior is normal. Cognition and memory are normal.  Assessment & Plan:   See Encounters Tab for problem based charting.  Patient discussed with Dr. Lynnae January.

## 2018-06-24 NOTE — Patient Instructions (Signed)
Thank you for visiting clinic today. I am giving you naproxen 500 mg to be taken twice daily for 3 days, then you can take it only as needed for pain. I am also giving you a lidocaine patch which you can put at your area of pain, change your patch daily. You can also try some Voltaren gel at the affected area and see if that will help with your pain. If your pain continue to get worse or does not improve please come back to the clinic, we might have to try steroid injection at that time. I am also sending all of your medications to [facility] as your pharmacy is still closed, please pick up your medications and start taking them as directed.

## 2018-06-25 NOTE — Progress Notes (Signed)
Internal Medicine Clinic Attending  Case discussed with Dr. Amin at the time of the visit.  We reviewed the resident's history and exam and pertinent patient test results.  I agree with the assessment, diagnosis, and plan of care documented in the resident's note.    

## 2018-06-29 ENCOUNTER — Other Ambulatory Visit: Payer: Self-pay

## 2018-06-29 ENCOUNTER — Emergency Department (HOSPITAL_COMMUNITY): Payer: Medicare Other

## 2018-06-29 ENCOUNTER — Emergency Department (HOSPITAL_COMMUNITY)
Admission: EM | Admit: 2018-06-29 | Discharge: 2018-06-29 | Disposition: A | Discharge status: Home / Self Care | Payer: Medicare Other | Attending: Emergency Medicine | Admitting: Emergency Medicine

## 2018-06-29 DIAGNOSIS — Z8673 Personal history of transient ischemic attack (TIA), and cerebral infarction without residual deficits: Secondary | ICD-10-CM | POA: Insufficient documentation

## 2018-06-29 DIAGNOSIS — R51 Headache: Secondary | ICD-10-CM | POA: Diagnosis not present

## 2018-06-29 DIAGNOSIS — Z87891 Personal history of nicotine dependence: Secondary | ICD-10-CM | POA: Insufficient documentation

## 2018-06-29 DIAGNOSIS — Z79899 Other long term (current) drug therapy: Secondary | ICD-10-CM | POA: Insufficient documentation

## 2018-06-29 DIAGNOSIS — R42 Dizziness and giddiness: Secondary | ICD-10-CM | POA: Insufficient documentation

## 2018-06-29 DIAGNOSIS — Y929 Unspecified place or not applicable: Secondary | ICD-10-CM | POA: Diagnosis not present

## 2018-06-29 DIAGNOSIS — R0789 Other chest pain: Secondary | ICD-10-CM | POA: Insufficient documentation

## 2018-06-29 DIAGNOSIS — R112 Nausea with vomiting, unspecified: Secondary | ICD-10-CM | POA: Diagnosis not present

## 2018-06-29 DIAGNOSIS — Y999 Unspecified external cause status: Secondary | ICD-10-CM | POA: Insufficient documentation

## 2018-06-29 DIAGNOSIS — Y9389 Activity, other specified: Secondary | ICD-10-CM | POA: Insufficient documentation

## 2018-06-29 DIAGNOSIS — Z7901 Long term (current) use of anticoagulants: Secondary | ICD-10-CM | POA: Diagnosis not present

## 2018-06-29 DIAGNOSIS — S0990XA Unspecified injury of head, initial encounter: Secondary | ICD-10-CM | POA: Diagnosis not present

## 2018-06-29 DIAGNOSIS — W1830XA Fall on same level, unspecified, initial encounter: Secondary | ICD-10-CM | POA: Diagnosis not present

## 2018-06-29 DIAGNOSIS — I10 Essential (primary) hypertension: Secondary | ICD-10-CM | POA: Diagnosis not present

## 2018-06-29 DIAGNOSIS — S199XXA Unspecified injury of neck, initial encounter: Secondary | ICD-10-CM | POA: Diagnosis not present

## 2018-06-29 DIAGNOSIS — W19XXXA Unspecified fall, initial encounter: Secondary | ICD-10-CM

## 2018-06-29 LAB — URINALYSIS, ROUTINE W REFLEX MICROSCOPIC
Bacteria, UA: NONE SEEN
Bilirubin Urine: NEGATIVE
Glucose, UA: NEGATIVE mg/dL
Ketones, ur: NEGATIVE mg/dL
Leukocytes,Ua: NEGATIVE
Nitrite: NEGATIVE
Protein, ur: NEGATIVE mg/dL
Specific Gravity, Urine: 1.004 — ABNORMAL LOW (ref 1.005–1.030)
pH: 6 (ref 5.0–8.0)

## 2018-06-29 LAB — CBC WITH DIFFERENTIAL/PLATELET
Abs Immature Granulocytes: 0.01 10*3/uL (ref 0.00–0.07)
Basophils Absolute: 0.1 10*3/uL (ref 0.0–0.1)
Basophils Relative: 1 %
Eosinophils Absolute: 0.2 10*3/uL (ref 0.0–0.5)
Eosinophils Relative: 2 %
HCT: 44.4 % (ref 39.0–52.0)
Hemoglobin: 14.8 g/dL (ref 13.0–17.0)
Immature Granulocytes: 0 %
Lymphocytes Relative: 34 %
Lymphs Abs: 2.7 10*3/uL (ref 0.7–4.0)
MCH: 28.8 pg (ref 26.0–34.0)
MCHC: 33.3 g/dL (ref 30.0–36.0)
MCV: 86.5 fL (ref 80.0–100.0)
Monocytes Absolute: 0.6 10*3/uL (ref 0.1–1.0)
Monocytes Relative: 8 %
Neutro Abs: 4.4 10*3/uL (ref 1.7–7.7)
Neutrophils Relative %: 55 %
Platelets: 244 10*3/uL (ref 150–400)
RBC: 5.13 MIL/uL (ref 4.22–5.81)
RDW: 15.1 % (ref 11.5–15.5)
WBC: 7.9 10*3/uL (ref 4.0–10.5)
nRBC: 0 % (ref 0.0–0.2)

## 2018-06-29 LAB — COMPREHENSIVE METABOLIC PANEL
ALT: 12 U/L (ref 0–44)
AST: 18 U/L (ref 15–41)
Albumin: 4.2 g/dL (ref 3.5–5.0)
Alkaline Phosphatase: 61 U/L (ref 38–126)
Anion gap: 16 — ABNORMAL HIGH (ref 5–15)
BUN: 13 mg/dL (ref 8–23)
CO2: 22 mmol/L (ref 22–32)
Calcium: 10.1 mg/dL (ref 8.9–10.3)
Chloride: 99 mmol/L (ref 98–111)
Creatinine, Ser: 1.07 mg/dL (ref 0.61–1.24)
GFR calc Af Amer: >60 mL/min (ref >60)
GFR calc non Af Amer: >60 mL/min (ref >60)
Glucose, Bld: 114 mg/dL — ABNORMAL HIGH (ref 70–99)
Potassium: 3.1 mmol/L — ABNORMAL LOW (ref 3.5–5.1)
Sodium: 137 mmol/L (ref 135–145)
Total Bilirubin: 0.8 mg/dL (ref 0.3–1.2)
Total Protein: 8.4 g/dL — ABNORMAL HIGH (ref 6.5–8.1)

## 2018-06-29 LAB — TROPONIN I: Troponin I: <0.03 ng/mL (ref <0.03)

## 2018-06-29 LAB — LIPASE, BLOOD: Lipase: 27 U/L (ref 11–51)

## 2018-06-29 LAB — CBG MONITORING, ED: Glucose-Capillary: 93 mg/dL (ref 70–99)

## 2018-06-29 MED ORDER — POTASSIUM CHLORIDE CRYS ER 20 MEQ PO TBCR
40.0000 meq | EXTENDED_RELEASE_TABLET | Freq: Once | ORAL | Status: AC
  Administered 2018-06-29: 15:00:00 40 meq via ORAL
  Filled 2018-06-29: qty 2

## 2018-06-29 MED ORDER — SODIUM CHLORIDE 0.9 % IV BOLUS
1000.0000 mL | Freq: Once | INTRAVENOUS | Status: AC
  Administered 2018-06-29: 12:00:00 1000 mL via INTRAVENOUS

## 2018-06-29 MED ORDER — ONDANSETRON HCL 4 MG/2ML IJ SOLN
4.0000 mg | Freq: Once | INTRAMUSCULAR | Status: AC
  Administered 2018-06-29: 4 mg via INTRAVENOUS
  Filled 2018-06-29: qty 2

## 2018-06-29 NOTE — ED Notes (Signed)
Pt able to drink fluids and take potassium tablets without incident.  States no dizziness currently, pleasant and joival

## 2018-06-29 NOTE — Discharge Instructions (Signed)
You have been seen today for fall. Please read and follow all provided instructions. Return to the emergency room for worsening condition or new concerning symptoms.    1. Medications:  Continue usual home medications Take medications as prescribed. Please review all of the medicines and only take them if you do not have an allergy to them.   2. Treatment: rest, drink plenty of fluids  3. Follow Up: Please follow up with your primary doctor in 2-5 days for discussion of your diagnoses and further evaluation after today's visit; Call today to arrange your follow up.  If you do not have a primary care doctor use the resource guide provided to find one;   It is also a possibility that you have an allergic reaction to any of the medicines that you have been prescribed - Everybody reacts differently to medications and while MOST people have no trouble with most medicines, you may have a reaction such as nausea, vomiting, rash, swelling, shortness of breath. If this is the case, please stop taking the medicine immediately and contact your physician.  ?

## 2018-06-29 NOTE — ED Triage Notes (Signed)
Pt reports that he is dizzy with lightheadedness.  Has fallen tgwice in 24 hours.  Speech slow

## 2018-06-29 NOTE — ED Notes (Signed)
Pt does admit to nausea and CP.

## 2018-06-29 NOTE — ED Notes (Signed)
Wife, [name]- [phone] for updates

## 2018-06-29 NOTE — ED Provider Notes (Signed)
[facility] EMERGENCY DEPARTMENT Provider Note   CSN: [id number] Arrival date & time: 06/29/18  1135    History   Chief Complaint Dizziness, fall  HPI Stephen [name] is a 68 y.o. male with history of hypertension, presented to emergency department today with chief complaint of dizziness and falls x2 days.  Patient reports yesterday he started to feel dizzy.  He describes the dizziness as he feels like the room is spinning.  When walking to use the restroom he fell yesterday twice.  He is unsure if he hit his head.  He does not think that he passed out.  He is currently complaining of pain in his head located across his forehead.  He is also reporting nausea and vomiting, states he had one episode of nonbloody nonbilious emesis on the ride to the hospital. Unable to give more specific description of how he is feeling, just repeats "feeling bad." Decreased PO intake over last 24 hours  History provided by patient with additional history obtained from chart review.       Past Medical History:  Diagnosis Date   Alcohol abuse    Arthropathy    shoulder   Chest pain    Chronic cough    Foot pain    bilateral   Hyperlipidemia    Hypertension    Intermittent claudication (HCC)    bilateral   Latent tuberculosis    Lumbago    Shoulder pain     Patient Active Problem List   Diagnosis Date Noted   Right arm and right-sided back pain 12/09/2017   Tubular adenoma of colon 12/09/2017   Hemorrhoids 07/06/2015   Healthcare maintenance 12/19/2012   Hypokalemia 09/19/2012   History of CVA (cerebrovascular accident) 09/08/2012   GERD (gastroesophageal reflux disease) 01/28/2012   Chronic back pain s/p L5-S1 laminectomy 05/09/2011   Peripheral vascular disease ([place]) 12/10/2006   Hyperlipidemia 06/12/2006   Essential hypertension 06/12/2006    Past Surgical History:  Procedure Laterality Date   APPENDECTOMY     HAMMER TOE SURGERY     right  2nd. and 5th   LAMINECTOMY  1989-1991   left lumbar         Home Medications    Prior to Admission medications   Medication Sig Start Date End Date Taking? Authorizing Provider  amLODipine (NORVASC) 10 MG tablet TAKE ONE (1) TABLET BY MOUTH EVERY DAY Patient taking differently: Take 10 mg by mouth daily. TAKE ONE (1) TABLET BY MOUTH EVERY DAY 06/24/18  Yes Lorella Nimrod, MD  atorvastatin (LIPITOR) 80 MG tablet TAKE ONE (1) TABLET BY MOUTH EVERY DAY AT SIX IN THE EVENING Patient taking differently: Take 80 mg by mouth daily at 6 PM. TAKE ONE (1) TABLET BY MOUTH EVERY DAY AT SIX IN THE EVENING 06/24/18  Yes Lorella Nimrod, MD  clopidogrel (PLAVIX) 75 MG tablet TAKE ONE (1) TABLET BY MOUTH EVERY DAY Patient taking differently: Take 75 mg by mouth daily.  12/04/17  Yes Carroll Sage, MD  gabapentin (NEURONTIN) 400 MG capsule Take 1 capsule (400 mg total) by mouth 3 (three) times daily. 06/24/18  Yes Lorella Nimrod, MD  hydrochlorothiazide (HYDRODIURIL) 25 MG tablet TAKE ONE (1) TABLET BY MOUTH EVERY DAY Patient taking differently: 25 mg daily. TAKE ONE (1) TABLET BY MOUTH EVERY DAY 06/24/18  Yes Lorella Nimrod, MD  Lidocaine (HM LIDOCAINE PATCH) 4 % PTCH Apply 1 patch topically daily. 06/24/18  Yes Lorella Nimrod, MD  naproxen (NAPROSYN) 500 MG tablet TAKE  ONE (1) TABLET BY MOUTH TWO (2) TIMES DAILY WITH A MEAL AS NEEDED Patient taking differently: Take 500 mg by mouth 2 (two) times daily as needed for mild pain or moderate pain. TAKE ONE (1) TABLET BY MOUTH TWO (2) TIMES DAILY WITH A MEAL AS NEEDED 06/24/18  Yes Lorella Nimrod, MD  pantoprazole (PROTONIX) 20 MG tablet TAKE ONE (1) TABLET BY MOUTH EVERY DAY Patient taking differently: Take 20 mg by mouth daily. TAKE ONE (1) TABLET BY MOUTH EVERY DAY 06/24/18  Yes Lorella Nimrod, MD  VOLTAREN 1 % GEL APPLY 4 GRAMS TO THE AFFECTED AREA FOUR TIMES DAILY Patient taking differently: Apply 4 g topically 4 (four) times daily.  06/24/18  Yes Lorella Nimrod, MD    cilostazol (PLETAL) 100 MG tablet TAKE ONE (1) TABLET BY MOUTH TWO (2) TIMES DAILY Patient not taking: Reported on 06/29/2018 12/04/17   Carroll Sage, MD  HYDROcodone-acetaminophen [facility]) 5-325 MG tablet Take 1 tablet by mouth every 4 (four) hours as needed for severe pain. Patient not taking: Reported on 06/29/2018 12/27/17   Sherwood Gambler, MD  predniSONE (STERAPRED UNI-PAK 21 TAB) 10 MG (21) TBPK tablet Take by mouth daily. Take 6 tabs by mouth daily  for 1 days, then 5 tabs for 2 days, then 4 tabs for 2 days, then 3 tabs for 2 days, 2 tabs for 2 days, then 1 tab by mouth daily for 2 days Patient not taking: Reported on 06/29/2018 12/28/17   Sherwood Gambler, MD    Family History Family History  Problem Relation Age of Onset   Other Unknown        there is no specific hx of coronary arterial disease/He does not know his parents history   Cancer Sister        unknown type    Social History Social History   Tobacco Use   Smoking status: Former Smoker    Types: Cigarettes    Last attempt to quit: 01/29/1990    Years since quitting: 28.4   Smokeless tobacco: Never Used  Substance Use Topics   Alcohol use: No    Alcohol/week: 0.0 standard drinks    Comment: quit alcohol approximately 20 years ago   Drug use: Yes    Frequency: 1.0 times per week    Types: Marijuana    Comment: marijuana use about once a month. Former cocaine use. quit in the 1980's     Allergies   Aspirin and Lisinopril   Review of Systems Review of Systems  Constitutional: Negative for chills and fever.  HENT: Negative for congestion, rhinorrhea, sinus pressure and sore throat.   Eyes: Negative for pain and redness.  Respiratory: Negative for cough, shortness of breath and wheezing.   Cardiovascular: Positive for chest pain. Negative for palpitations.  Gastrointestinal: Positive for nausea and vomiting. Negative for abdominal pain, constipation and diarrhea.  Genitourinary: Negative for dysuria.   Musculoskeletal: Positive for arthralgias. Negative for back pain, myalgias and neck pain.  Skin: Negative for rash and wound.  Neurological: Positive for dizziness and light-headedness. Negative for syncope, weakness, numbness and headaches.  Psychiatric/Behavioral: Negative for confusion.     Physical Exam Updated Vital Signs BP (!) 158/51    Pulse (!) 54    Temp 98.9 F (37.2 C) (Oral)    Resp 14    SpO2 100%   Physical Exam Vitals signs and nursing note reviewed.  Constitutional:      General: He is not in acute distress.  Appearance: He is not ill-appearing.     Comments: Patient is slow-moving with slow speech.  HENT:     Head: Normocephalic and atraumatic.     Comments: No tenderness to palpation of skull. No deformities or crepitus noted. No open wounds, abrasions or lacerations.    Right Ear: Tympanic membrane and external ear normal.     Left Ear: Tympanic membrane and external ear normal.     Nose: Nose normal.     Mouth/Throat:     Mouth: Mucous membranes are moist.     Pharynx: Oropharynx is clear.  Eyes:     General: No scleral icterus.       Right eye: No discharge.        Left eye: No discharge.     Extraocular Movements: Extraocular movements intact.     Conjunctiva/sclera: Conjunctivae normal.     Pupils: Pupils are equal, round, and reactive to light.  Neck:     Musculoskeletal: Normal range of motion. No muscular tenderness.     Vascular: No JVD.     Comments: Full ROM intact without spinous process TTP. No bony stepoffs or deformities, no paraspinous muscle TTP or muscle spasms. No bruising, erythema, or swelling.  Cardiovascular:     Rate and Rhythm: Regular rhythm. Bradycardia present.     Pulses: Normal pulses.          Radial pulses are 2+ on the right side and 2+ on the left side.     Heart sounds: Normal heart sounds.  Pulmonary:     Comments: Lungs clear to auscultation in all fields. Symmetric chest rise. No wheezing, rales, or  rhonchi. Chest:    Abdominal:     Comments: Abdomen is soft, non-distended, and non-tender in all quadrants. No rigidity, no guarding. No peritoneal signs.  Musculoskeletal: Normal range of motion.  Skin:    General: Skin is warm and dry.     Capillary Refill: Capillary refill takes less than 2 seconds.  Neurological:     Mental Status: He is oriented to person, place, and time.     GCS: GCS eye subscore is 4. GCS verbal subscore is 5. GCS motor subscore is 6.     Comments: Mental Status:  Alert, oriented, thought content appropriate, able to give a coherent history. Slow speech without evidence of aphasia. Able to follow 2 step commands without difficulty.  Cranial Nerves:  II:  Peripheral visual fields grossly normal, pupils equal, round, reactive to light. disconjugate gaze on right III,IV, VI: ptosis not present, extra-ocular motions intact bilaterally  V,VII: smile symmetric, facial light touch sensation equal VIII: hearing grossly normal to voice  X: uvula elevates symmetrically  XI: bilateral shoulder shrug symmetric and strong XII: midline tongue extension without fassiculations Motor:  Normal tone. 5/5 in upper and lower extremities bilaterally including strong and equal grip strength and dorsiflexion/plantar flexion Sensory: Pinprick and light touch normal in all extremities.  Deep Tendon Reflexes: 2+ and symmetric in the biceps and patella Cerebellar: dysmetria of right finger to nose, normal on left Gait: normal gait and balance CV: distal pulses palpable throughout     Psychiatric:        Behavior: Behavior normal.      ED Treatments / Results  Labs (all labs ordered are listed, but only abnormal results are displayed) Labs Reviewed  COMPREHENSIVE METABOLIC PANEL - Abnormal; Notable for the following components:      Result Value   Potassium 3.1 (*)  Glucose, Bld 114 (*)    Total Protein 8.4 (*)    Anion gap 16 (*)    All other components within normal  limits  URINALYSIS, ROUTINE W REFLEX MICROSCOPIC - Abnormal; Notable for the following components:   Color, Urine STRAW (*)    Specific Gravity, Urine 1.004 (*)    Hgb urine dipstick SMALL (*)    All other components within normal limits  CBC WITH DIFFERENTIAL/PLATELET  LIPASE, BLOOD  TROPONIN I  CBG MONITORING, ED    EKG None  Radiology Dg Chest 2 View  Result Date: 06/29/2018 CLINICAL DATA:  Dizziness.  Lightheaded. EXAM: CHEST - 2 VIEW COMPARISON:  11/21/2006 FINDINGS: Normal heart size. Lungs clear. No pneumothorax. No pleural effusion. IMPRESSION: No active cardiopulmonary disease. Electronically Signed   By: Marybelle Killings M.D.   On: 06/29/2018 14:15   Ct Head Wo Contrast  Result Date: 06/29/2018 CLINICAL DATA:  Dizziness and lightheadedness. Status post fall x2 over the past 24 hours. Initial encounter. EXAM: CT HEAD WITHOUT CONTRAST CT CERVICAL SPINE WITHOUT CONTRAST TECHNIQUE: Multidetector CT imaging of the head and cervical spine was performed following the standard protocol without intravenous contrast. Multiplanar CT image reconstructions of the cervical spine were also generated. COMPARISON:  Head CT and brain MRI 09/08/2012. FINDINGS: CT HEAD FINDINGS Brain: No acute infarct, hemorrhage, midline shift or abnormal extra-axial fluid collection. Atrophy, chronic microvascular ischemic change remote occipital lobe infarct noted. Remote lacunar infarction left basal ganglia also seen. The patient has a meningioma over the right frontal convexities measuring 1.1 cm in diameter, unchanged. Vascular: No hyperdense vessel.  Atherosclerosis noted. Skull: Intact.  No focal lesion. Sinuses/Orbits: Mucous retention cyst or polyp right maxillary sinus and mild mucosal thickening right maxillary sinus noted. Other: None. CT CERVICAL SPINE FINDINGS Alignment: Maintained. Skull base and vertebrae: No acute fracture. No primary bone lesion or focal pathologic process. Soft tissues and spinal canal:  No prevertebral fluid or swelling. No visible canal hematoma. Disc levels: Multilevel loss of disc space height is worst at C3-4, C5-6 and C6-7. Upper chest: Emphysematous disease is present. Other: None. IMPRESSION: No acute abnormality head or cervical spine. Atrophy, chronic microvascular ischemic change and remote right occipital lobe infarct. No change in a small meningioma over the right frontal convexities. Atherosclerosis. Emphysema. Electronically Signed   By: Inge Rise M.D.   On: 06/29/2018 13:09   Ct Cervical Spine Wo Contrast  Result Date: 06/29/2018 CLINICAL DATA:  Dizziness and lightheadedness. Status post fall x2 over the past 24 hours. Initial encounter. EXAM: CT HEAD WITHOUT CONTRAST CT CERVICAL SPINE WITHOUT CONTRAST TECHNIQUE: Multidetector CT imaging of the head and cervical spine was performed following the standard protocol without intravenous contrast. Multiplanar CT image reconstructions of the cervical spine were also generated. COMPARISON:  Head CT and brain MRI 09/08/2012. FINDINGS: CT HEAD FINDINGS Brain: No acute infarct, hemorrhage, midline shift or abnormal extra-axial fluid collection. Atrophy, chronic microvascular ischemic change remote occipital lobe infarct noted. Remote lacunar infarction left basal ganglia also seen. The patient has a meningioma over the right frontal convexities measuring 1.1 cm in diameter, unchanged. Vascular: No hyperdense vessel.  Atherosclerosis noted. Skull: Intact.  No focal lesion. Sinuses/Orbits: Mucous retention cyst or polyp right maxillary sinus and mild mucosal thickening right maxillary sinus noted. Other: None. CT CERVICAL SPINE FINDINGS Alignment: Maintained. Skull base and vertebrae: No acute fracture. No primary bone lesion or focal pathologic process. Soft tissues and spinal canal: No prevertebral fluid or swelling. No visible canal hematoma.  Disc levels: Multilevel loss of disc space height is worst at C3-4, C5-6 and C6-7. Upper  chest: Emphysematous disease is present. Other: None. IMPRESSION: No acute abnormality head or cervical spine. Atrophy, chronic microvascular ischemic change and remote right occipital lobe infarct. No change in a small meningioma over the right frontal convexities. Atherosclerosis. Emphysema. Electronically Signed   By: Inge Rise M.D.   On: 06/29/2018 13:09    Procedures Procedures (including critical care time)  Medications Ordered in ED Medications  sodium chloride 0.9 % bolus 1,000 mL (0 mLs Intravenous Stopped 06/29/18 1448)  ondansetron (ZOFRAN) injection 4 mg (4 mg Intravenous Given 06/29/18 1218)  potassium chloride SA (K-DUR) CR tablet 40 mEq (40 mEq Oral Given 06/29/18 1448)     Initial Impression / Assessment and Plan / ED Course  I have reviewed the triage vital signs and the nursing notes.  Pertinent labs & imaging results that were available during my care of the patient were reviewed by me and considered in my medical decision making (see chart for details).  On arrival pt has slow speech and is unable to give a history to why he is here. He is alert and oriented x3, afebrile, non toxic appearing, normotensive. Spoke to pt's spouse on the phone who states he was complaining of feeling dizzy and he had to mechanical falls at home without LOC. CT head and neck viewed by me are without signs of bleeding or injury. He was also reporting chest pain, chest xray also viewed by me is without infiltrate. Labs remarkable for hypokalemia of 3.1, replaced with PO. Troponin is negative, pt's chest pain is not consistent with ACS. UA without signs of infection.  IVF given.  On reassessment pt is more alert, speaking clearly. Attending Dr. Venora Maples and myself assisted pt with ambulation and he did so with steady gait. He uses a cane and sometimes walker at home. He is tolerating PO intake with sandwich and water.  Patient is hemodynamically stable, in NAD at discharge. Evaluation does not show  pathology that would require ongoing emergent intervention or inpatient treatment. I explained the diagnosis to the patient. Patient is comfortable with above plan and is stable for discharge at this time. All questions were answered prior to disposition. Strict return precautions for returning to the ED were discussed. Encouraged close follow up with PCP.  This note was prepared using Dragon voice recognition software and may include unintentional dictation errors due to the inherent limitations of voice recognition software.   Final Clinical Impressions(s) / ED Diagnoses   Final diagnoses:  Fall, initial encounter    ED Discharge Orders    None       Flint Melter 06/29/18 [place], MD 07/03/18 [phone]

## 2018-06-29 NOTE — ED Notes (Signed)
This RN acting as Art therapist and asked pt if he would like for me to call any family/friends. Pt requested that his wife, Stephen [name] be called. I spoke with Stephen [name] and let her know pt's care thus far in the ED. Pt's wife will be picking him up if he is d/ced.

## 2018-07-20 ENCOUNTER — Encounter: Payer: Self-pay | Admitting: *Deleted

## 2018-07-25 ENCOUNTER — Other Ambulatory Visit: Payer: Self-pay | Admitting: Internal Medicine

## 2018-07-25 DIAGNOSIS — M79601 Pain in right arm: Secondary | ICD-10-CM

## 2018-10-01 ENCOUNTER — Ambulatory Visit (INDEPENDENT_AMBULATORY_CARE_PROVIDER_SITE_OTHER): Payer: Medicare Other | Admitting: Internal Medicine

## 2018-10-01 ENCOUNTER — Other Ambulatory Visit: Payer: Self-pay

## 2018-10-01 VITALS — BP 112/87 | HR 59 | Temp 98.3°F | Ht 69.0 in | Wt 159.8 lb

## 2018-10-01 DIAGNOSIS — Z79899 Other long term (current) drug therapy: Secondary | ICD-10-CM

## 2018-10-01 DIAGNOSIS — Z23 Encounter for immunization: Secondary | ICD-10-CM | POA: Diagnosis not present

## 2018-10-01 DIAGNOSIS — G8929 Other chronic pain: Secondary | ICD-10-CM | POA: Diagnosis not present

## 2018-10-01 DIAGNOSIS — M5417 Radiculopathy, lumbosacral region: Secondary | ICD-10-CM

## 2018-10-01 DIAGNOSIS — M79601 Pain in right arm: Secondary | ICD-10-CM

## 2018-10-01 DIAGNOSIS — Z981 Arthrodesis status: Secondary | ICD-10-CM | POA: Diagnosis not present

## 2018-10-01 DIAGNOSIS — Z0000 Encounter for general adult medical examination without abnormal findings: Secondary | ICD-10-CM

## 2018-10-01 DIAGNOSIS — M5442 Lumbago with sciatica, left side: Secondary | ICD-10-CM

## 2018-10-01 MED ORDER — DULOXETINE HCL 30 MG PO CPEP: 30.0000 mg | ORAL_CAPSULE | Freq: Every day | ORAL | 1 refills | Status: DC

## 2018-10-01 NOTE — Assessment & Plan Note (Signed)
Flu shot administered today 10/01/2018

## 2018-10-01 NOTE — Progress Notes (Signed)
   CC: L leg pain  HPI:  Mr.[name] Majka is a 68 y.o. M with significant PMH as outlined below. Please see problem based charting for additional information.   Past Medical History:  Diagnosis Date  . Alcohol abuse   . Arthropathy    shoulder  . Chest pain   . Chronic cough   . Foot pain    bilateral  . Hyperlipidemia   . Hypertension   . Intermittent claudication (HCC)    bilateral  . Latent tuberculosis   . Lumbago   . Shoulder pain    Review of Systems:   Review of Systems  Constitutional: Negative for chills and fever.  Gastrointestinal: Negative for constipation and diarrhea.  Genitourinary: Negative for dysuria.  Musculoskeletal: Positive for back pain and falls. Negative for joint pain.  Neurological: Negative for tingling and weakness.   Physical Exam:  Vitals:   10/01/18 1316  BP: 112/87  Pulse: (!) 59  Temp: 98.3 F (36.8 C)  TempSrc: Oral  SpO2: 93%  Weight: 159 lb 12.8 oz (72.5 kg)  Height: 5\' 9"  (1.753 m)   Physical Exam Vitals signs and nursing note reviewed.  Constitutional:      General: He is not in acute distress.    Appearance: He is ill-appearing (chronically).     Comments: Appears older than stated age.  Cardiovascular:     Rate and Rhythm: Normal rate and regular rhythm.     Heart sounds: Normal heart sounds.  Pulmonary:     Effort: Pulmonary effort is normal.     Breath sounds: Normal breath sounds.  Abdominal:     General: Abdomen is flat. Bowel sounds are normal.     Palpations: Abdomen is soft.  Musculoskeletal: Normal range of motion.     Comments: Tenderness to palpation on posterior L leg and R calf. Normal ROM and full strength. No joint pain swelling or gross deformities.  Skin:    General: Skin is warm and dry.     Findings: No rash.  Neurological:     Mental Status: He is alert.     Motor: Motor function is intact.    Assessment & Plan:   See Encounters Tab for problem based charting.  Patient seen with Dr.  Rebeca Alert

## 2018-10-01 NOTE — Patient Instructions (Addendum)
Stephen [name],  Your ongoing leg pain was evaluated today. It looks to be related to your prior back pain and pinched nerves that come out of the spine and travel down your leg. I have placed a referral to Neurosurgery who would be the ones able to discuss doing a joint injection in the back for you. Also try taking duloxetine once a day for additional pain relief.  Call a provider if you begin experiencing any changes in your bowel or bladder function, worsening weakness in the legs, or numbness/tingling around your back, bottom, and legs.   Sciatica  Sciatica is pain, weakness, tingling, or loss of feeling (numbness) along the sciatic nerve. The sciatic nerve starts in the lower back and goes down the back of each leg. Sciatica usually goes away on its own or with treatment. Sometimes, sciatica may come back (recur). What are the causes? This condition happens when the sciatic nerve is pinched or has pressure put on it. This may be the result of:  A disk in between the bones of the spine bulging out too far (herniated disk).  Changes in the spinal disks that occur with aging.  A condition that affects a muscle in the butt.  Extra bone growth near the sciatic nerve.  A break (fracture) of the area between your hip bones (pelvis).  Pregnancy.  Tumor. This is rare. What increases the risk? You are more likely to develop this condition if you:  Play sports that put pressure or stress on the spine.  Have poor strength and ease of movement (flexibility).  Have had a back injury in the past.  Have had back surgery.  Sit for long periods of time.  Do activities that involve bending or lifting over and over again.  Are very overweight (obese). What are the signs or symptoms? Symptoms can vary from mild to very bad. They may include:  Any of these problems in the lower back, leg, hip, or butt: ? Mild tingling, loss of feeling, or dull aches. ? Burning sensations. ? Sharp  pains.  Loss of feeling in the back of the calf or the sole of the foot.  Leg weakness.  Very bad back pain that makes it hard to move. These symptoms may get worse when you cough, sneeze, or laugh. They may also get worse when you sit or stand for long periods of time. How is this treated? This condition often gets better without any treatment. However, treatment may include:  Changing or cutting back on physical activity when you have pain.  Doing exercises and stretching.  Putting ice or heat on the affected area.  Medicines that help: ? To relieve pain and swelling. ? To relax your muscles.  Shots (injections) of medicines that help to relieve pain, irritation, and swelling.  Surgery. Follow these instructions at home: Medicines  Take over-the-counter and prescription medicines only as told by your doctor.  Ask your doctor if the medicine prescribed to you: ? Requires you to avoid driving or using heavy machinery. ? Can cause trouble pooping (constipation). You may need to take these steps to prevent or treat trouble pooping:  Drink enough fluids to keep your pee (urine) pale yellow.  Take over-the-counter or prescription medicines.  Eat foods that are high in fiber. These include beans, whole grains, and fresh fruits and vegetables.  Limit foods that are high in fat and sugar. These include fried or sweet foods. Managing pain      If told, put  ice on the affected area. ? Put ice in a plastic bag. ? Place a towel between your skin and the bag. ? Leave the ice on for 20 minutes, 2-3 times a day.  If told, put heat on the affected area. Use the heat source that your doctor tells you to use, such as a moist heat pack or a heating pad. ? Place a towel between your skin and the heat source. ? Leave the heat on for 20-30 minutes. ? Remove the heat if your skin turns bright red. This is very important if you are unable to feel pain, heat, or cold. You may have a  greater risk of getting burned. Activity   Return to your normal activities as told by your doctor. Ask your doctor what activities are safe for you.  Avoid activities that make your symptoms worse.  Take short rests during the day. ? When you rest for a long time, do some physical activity or stretching between periods of rest. ? Avoid sitting for a long time without moving. Get up and move around at least one time each hour.  Exercise and stretch regularly, as told by your doctor.  Do not lift anything that is heavier than 10 lb (4.5 kg) while you have symptoms of sciatica. ? Avoid lifting heavy things even when you do not have symptoms. ? Avoid lifting heavy things over and over.  When you lift objects, always lift in a way that is safe for your body. To do this, you should: ? Bend your knees. ? Keep the object close to your body. ? Avoid twisting. General instructions  Stay at a healthy weight.  Wear comfortable shoes that support your feet. Avoid wearing high heels.  Avoid sleeping on a mattress that is too soft or too hard. You might have less pain if you sleep on a mattress that is firm enough to support your back.  Keep all follow-up visits as told by your doctor. This is important. Contact a doctor if:  You have pain that: ? Wakes you up when you are sleeping. ? Gets worse when you lie down. ? Is worse than the pain you have had in the past. ? Lasts longer than 4 weeks.  You lose weight without trying. Get help right away if:  You cannot control when you pee (urinate) or poop (have a bowel movement).  You have weakness in any of these areas and it gets worse: ? Lower back. ? The area between your hip bones. ? Butt. ? Legs.  You have redness or swelling of your back.  You have a burning feeling when you pee. Summary  Sciatica is pain, weakness, tingling, or loss of feeling (numbness) along the sciatic nerve.  This condition happens when the sciatic  nerve is pinched or has pressure put on it.  Sciatica can cause pain, tingling, or loss of feeling (numbness) in the lower back, legs, hips, and butt.  Treatment often includes rest, exercise, medicines, and putting ice or heat on the affected area. This information is not intended to replace advice given to you by your health care provider. Make sure you discuss any questions you have with your health care provider. Document Released: 10/25/2007 Document Revised: 02/03/2018 Document Reviewed: 02/03/2018 Elsevier Patient Education  [place].

## 2018-10-01 NOTE — Assessment & Plan Note (Addendum)
Pt presents today for ongoing L leg pain that has been present for years. He states that the pain radiates from his back down to his feet. He denies any muscle weakness, though wife stated he had a recent fall at the grocery store a few months ago. He uses a cane to mobilize and states he is not very active. Denies changes in bowel or bladder function, weakness in the LEs, or saddle anesthesia. Was previously on gabapentin, though could not remember why it was discontinued. Pt with history of lumbar laminectomies in 2006.   No neurologic deficits on exam. Full ROM and strength in the lower extremities bilaterally. Pt endorses muscle tenderness to palpation on the posterior side of his L leg (hamstring and calf) and his R calf.   Assessment - Chronic lumbosacral radiculopathy - referral to neurosurgery for potential epidural glucocorticoid injection - will start duloxetine 30mg  daily for neuropathic pain, can titrate up dose in a few months if he tolerates the medication well

## 2018-10-07 NOTE — Progress Notes (Signed)
Internal Medicine Clinic Attending  I saw and evaluated the patient.  I personally confirmed the key portions of the history and exam documented by Dr. Ronnald Ramp and I reviewed pertinent patient test results.  The assessment, diagnosis, and plan were formulated together and I agree with the documentation in the resident's note.  Lenice Pressman, M.D., Ph.D.

## 2018-10-08 ENCOUNTER — Encounter: Payer: Self-pay | Admitting: Internal Medicine

## 2018-10-30 ENCOUNTER — Other Ambulatory Visit: Payer: Self-pay | Admitting: *Deleted

## 2018-10-30 DIAGNOSIS — M79601 Pain in right arm: Secondary | ICD-10-CM

## 2018-10-30 MED ORDER — GABAPENTIN 400 MG PO CAPS: 400.0000 mg | ORAL_CAPSULE | Freq: Three times a day (TID) | ORAL | 3 refills | Status: DC

## 2018-11-03 NOTE — Addendum Note (Signed)
Addended by: Hulan Fray on: 11/03/2018 06:17 AM   Modules accepted: Orders

## 2018-12-30 ENCOUNTER — Telehealth: Payer: Self-pay | Admitting: *Deleted

## 2018-12-30 NOTE — Telephone Encounter (Signed)
Yes please

## 2018-12-30 NOTE — Telephone Encounter (Signed)
[facility]:  Quanti flow: bil LE decreased  0.33 L 0.48 R No symptoms, pt states he feels well Would you like an appt in jan 2021?

## 2018-12-31 NOTE — Telephone Encounter (Signed)
Appt sch with PCP on 02/24/2019 with PCP @ 1:15pm.

## 2018-12-31 NOTE — Telephone Encounter (Signed)
Per dr Darrick Meigs please schedule appt in [place] with her

## 2019-01-01 ENCOUNTER — Emergency Department (HOSPITAL_COMMUNITY): Payer: Medicare Other

## 2019-01-01 ENCOUNTER — Inpatient Hospital Stay (HOSPITAL_COMMUNITY)
Admission: EM | Admit: 2019-01-01 | Discharge: 2019-01-05 | DRG: 065 | Disposition: A | Discharge status: Skilled Nursing Facility | Payer: Medicare Other | Attending: Internal Medicine | Admitting: Internal Medicine

## 2019-01-01 ENCOUNTER — Other Ambulatory Visit: Payer: Self-pay

## 2019-01-01 ENCOUNTER — Encounter (HOSPITAL_COMMUNITY): Payer: Self-pay

## 2019-01-01 DIAGNOSIS — I6381 Other cerebral infarction due to occlusion or stenosis of small artery: Principal | ICD-10-CM | POA: Diagnosis present

## 2019-01-01 DIAGNOSIS — Z7982 Long term (current) use of aspirin: Secondary | ICD-10-CM | POA: Diagnosis not present

## 2019-01-01 DIAGNOSIS — I63311 Cerebral infarction due to thrombosis of right middle cerebral artery: Secondary | ICD-10-CM

## 2019-01-01 DIAGNOSIS — G8194 Hemiplegia, unspecified affecting left nondominant side: Secondary | ICD-10-CM | POA: Diagnosis not present

## 2019-01-01 DIAGNOSIS — M2550 Pain in unspecified joint: Secondary | ICD-10-CM | POA: Diagnosis not present

## 2019-01-01 DIAGNOSIS — R29706 NIHSS score 6: Secondary | ICD-10-CM | POA: Diagnosis present

## 2019-01-01 DIAGNOSIS — Z23 Encounter for immunization: Secondary | ICD-10-CM

## 2019-01-01 DIAGNOSIS — K219 Gastro-esophageal reflux disease without esophagitis: Secondary | ICD-10-CM | POA: Diagnosis not present

## 2019-01-01 DIAGNOSIS — R7303 Prediabetes: Secondary | ICD-10-CM | POA: Diagnosis not present

## 2019-01-01 DIAGNOSIS — Z8673 Personal history of transient ischemic attack (TIA), and cerebral infarction without residual deficits: Secondary | ICD-10-CM | POA: Diagnosis not present

## 2019-01-01 DIAGNOSIS — Z888 Allergy status to other drugs, medicaments and biological substances status: Secondary | ICD-10-CM

## 2019-01-01 DIAGNOSIS — G9340 Encephalopathy, unspecified: Secondary | ICD-10-CM

## 2019-01-01 DIAGNOSIS — I6501 Occlusion and stenosis of right vertebral artery: Secondary | ICD-10-CM | POA: Diagnosis not present

## 2019-01-01 DIAGNOSIS — R29810 Facial weakness: Secondary | ICD-10-CM | POA: Diagnosis present

## 2019-01-01 DIAGNOSIS — M6281 Muscle weakness (generalized): Secondary | ICD-10-CM | POA: Diagnosis not present

## 2019-01-01 DIAGNOSIS — I2510 Atherosclerotic heart disease of native coronary artery without angina pectoris: Secondary | ICD-10-CM | POA: Diagnosis present

## 2019-01-01 DIAGNOSIS — Z8615 Personal history of latent tuberculosis infection: Secondary | ICD-10-CM

## 2019-01-01 DIAGNOSIS — Z87891 Personal history of nicotine dependence: Secondary | ICD-10-CM | POA: Diagnosis not present

## 2019-01-01 DIAGNOSIS — Z791 Long term (current) use of non-steroidal anti-inflammatories (NSAID): Secondary | ICD-10-CM

## 2019-01-01 DIAGNOSIS — R1311 Dysphagia, oral phase: Secondary | ICD-10-CM | POA: Diagnosis not present

## 2019-01-01 DIAGNOSIS — D329 Benign neoplasm of meninges, unspecified: Secondary | ICD-10-CM | POA: Diagnosis not present

## 2019-01-01 DIAGNOSIS — I739 Peripheral vascular disease, unspecified: Secondary | ICD-10-CM | POA: Diagnosis present

## 2019-01-01 DIAGNOSIS — I63211 Cerebral infarction due to unspecified occlusion or stenosis of right vertebral arteries: Secondary | ICD-10-CM | POA: Diagnosis not present

## 2019-01-01 DIAGNOSIS — R531 Weakness: Secondary | ICD-10-CM

## 2019-01-01 DIAGNOSIS — Z91138 Patient's unintentional underdosing of medication regimen for other reason: Secondary | ICD-10-CM | POA: Diagnosis not present

## 2019-01-01 DIAGNOSIS — R05 Cough: Secondary | ICD-10-CM | POA: Diagnosis not present

## 2019-01-01 DIAGNOSIS — I639 Cerebral infarction, unspecified: Secondary | ICD-10-CM | POA: Diagnosis not present

## 2019-01-01 DIAGNOSIS — M25562 Pain in left knee: Secondary | ICD-10-CM

## 2019-01-01 DIAGNOSIS — R079 Chest pain, unspecified: Secondary | ICD-10-CM

## 2019-01-01 DIAGNOSIS — T45526A Underdosing of antithrombotic drugs, initial encounter: Secondary | ICD-10-CM | POA: Diagnosis present

## 2019-01-01 DIAGNOSIS — Z886 Allergy status to analgesic agent status: Secondary | ICD-10-CM

## 2019-01-01 DIAGNOSIS — R5381 Other malaise: Secondary | ICD-10-CM | POA: Diagnosis not present

## 2019-01-01 DIAGNOSIS — F0150 Vascular dementia without behavioral disturbance: Secondary | ICD-10-CM | POA: Diagnosis present

## 2019-01-01 DIAGNOSIS — M62471 Contracture of muscle, right ankle and foot: Secondary | ICD-10-CM | POA: Diagnosis not present

## 2019-01-01 DIAGNOSIS — I679 Cerebrovascular disease, unspecified: Secondary | ICD-10-CM | POA: Diagnosis not present

## 2019-01-01 DIAGNOSIS — S8992XA Unspecified injury of left lower leg, initial encounter: Secondary | ICD-10-CM | POA: Diagnosis not present

## 2019-01-01 DIAGNOSIS — R471 Dysarthria and anarthria: Secondary | ICD-10-CM | POA: Diagnosis present

## 2019-01-01 DIAGNOSIS — R258 Other abnormal involuntary movements: Secondary | ICD-10-CM | POA: Diagnosis not present

## 2019-01-01 DIAGNOSIS — M62462 Contracture of muscle, left lower leg: Secondary | ICD-10-CM | POA: Diagnosis not present

## 2019-01-01 DIAGNOSIS — Z741 Need for assistance with personal care: Secondary | ICD-10-CM | POA: Diagnosis not present

## 2019-01-01 DIAGNOSIS — E785 Hyperlipidemia, unspecified: Secondary | ICD-10-CM | POA: Diagnosis present

## 2019-01-01 DIAGNOSIS — R488 Other symbolic dysfunctions: Secondary | ICD-10-CM | POA: Diagnosis not present

## 2019-01-01 DIAGNOSIS — I69351 Hemiplegia and hemiparesis following cerebral infarction affecting right dominant side: Secondary | ICD-10-CM

## 2019-01-01 DIAGNOSIS — I10 Essential (primary) hypertension: Secondary | ICD-10-CM | POA: Diagnosis present

## 2019-01-01 DIAGNOSIS — E876 Hypokalemia: Secondary | ICD-10-CM | POA: Diagnosis present

## 2019-01-01 DIAGNOSIS — I6389 Other cerebral infarction: Secondary | ICD-10-CM | POA: Diagnosis not present

## 2019-01-01 DIAGNOSIS — D320 Benign neoplasm of cerebral meninges: Secondary | ICD-10-CM | POA: Diagnosis present

## 2019-01-01 DIAGNOSIS — Z20828 Contact with and (suspected) exposure to other viral communicable diseases: Secondary | ICD-10-CM | POA: Diagnosis present

## 2019-01-01 DIAGNOSIS — F1010 Alcohol abuse, uncomplicated: Secondary | ICD-10-CM | POA: Diagnosis present

## 2019-01-01 DIAGNOSIS — T466X6A Underdosing of antihyperlipidemic and antiarteriosclerotic drugs, initial encounter: Secondary | ICD-10-CM | POA: Diagnosis present

## 2019-01-01 DIAGNOSIS — Z7401 Bed confinement status: Secondary | ICD-10-CM | POA: Diagnosis not present

## 2019-01-01 DIAGNOSIS — Z79899 Other long term (current) drug therapy: Secondary | ICD-10-CM | POA: Diagnosis not present

## 2019-01-01 DIAGNOSIS — R0789 Other chest pain: Secondary | ICD-10-CM | POA: Diagnosis not present

## 2019-01-01 DIAGNOSIS — Z7902 Long term (current) use of antithrombotics/antiplatelets: Secondary | ICD-10-CM | POA: Diagnosis not present

## 2019-01-01 DIAGNOSIS — Z9119 Patient's noncompliance with other medical treatment and regimen: Secondary | ICD-10-CM | POA: Diagnosis not present

## 2019-01-01 LAB — HEPATIC FUNCTION PANEL
ALT: 17 U/L (ref 0–44)
AST: 22 U/L (ref 15–41)
Albumin: 3.9 g/dL (ref 3.5–5.0)
Alkaline Phosphatase: 83 U/L (ref 38–126)
Bilirubin, Direct: <0.1 mg/dL (ref 0.0–0.2)
Total Bilirubin: 0.4 mg/dL (ref 0.3–1.2)
Total Protein: 7.9 g/dL (ref 6.5–8.1)

## 2019-01-01 LAB — SARS CORONAVIRUS 2 (TAT 6-24 HRS): SARS Coronavirus 2: NEGATIVE

## 2019-01-01 LAB — TROPONIN I (HIGH SENSITIVITY)
Troponin I (High Sensitivity): 10 ng/L (ref <18)
Troponin I (High Sensitivity): 13 ng/L (ref <18)

## 2019-01-01 LAB — CBC
HCT: 43 % (ref 39.0–52.0)
Hemoglobin: 14.5 g/dL (ref 13.0–17.0)
MCH: 28.6 pg (ref 26.0–34.0)
MCHC: 33.7 g/dL (ref 30.0–36.0)
MCV: 84.8 fL (ref 80.0–100.0)
Platelets: 251 10*3/uL (ref 150–400)
RBC: 5.07 MIL/uL (ref 4.22–5.81)
RDW: 16.2 % — ABNORMAL HIGH (ref 11.5–15.5)
WBC: 8.6 10*3/uL (ref 4.0–10.5)
nRBC: 0 % (ref 0.0–0.2)

## 2019-01-01 LAB — BASIC METABOLIC PANEL
Anion gap: 11 (ref 5–15)
BUN: 16 mg/dL (ref 8–23)
CO2: 26 mmol/L (ref 22–32)
Calcium: 9.7 mg/dL (ref 8.9–10.3)
Chloride: 98 mmol/L (ref 98–111)
Creatinine, Ser: 1.14 mg/dL (ref 0.61–1.24)
GFR calc Af Amer: >60 mL/min (ref >60)
GFR calc non Af Amer: >60 mL/min (ref >60)
Glucose, Bld: 133 mg/dL — ABNORMAL HIGH (ref 70–99)
Potassium: 2.8 mmol/L — ABNORMAL LOW (ref 3.5–5.1)
Sodium: 135 mmol/L (ref 135–145)

## 2019-01-01 LAB — ETHANOL: Alcohol, Ethyl (B): <10 mg/dL (ref <10)

## 2019-01-01 LAB — HIV ANTIBODY (ROUTINE TESTING W REFLEX): HIV Screen 4th Generation wRfx: NONREACTIVE

## 2019-01-01 LAB — CBG MONITORING, ED: Glucose-Capillary: 116 mg/dL — ABNORMAL HIGH (ref 70–99)

## 2019-01-01 LAB — LIPASE, BLOOD: Lipase: 30 U/L (ref 11–51)

## 2019-01-01 MED ORDER — ASPIRIN 325 MG PO TABS
325.0000 mg | ORAL_TABLET | Freq: Every day | ORAL | Status: DC
  Administered 2019-01-02: 325 mg via ORAL
  Filled 2019-01-01: qty 1

## 2019-01-01 MED ORDER — POTASSIUM CHLORIDE CRYS ER 20 MEQ PO TBCR
40.0000 meq | EXTENDED_RELEASE_TABLET | Freq: Once | ORAL | Status: AC
  Administered 2019-01-01: 40 meq via ORAL
  Filled 2019-01-01: qty 2

## 2019-01-01 MED ORDER — GABAPENTIN 400 MG PO CAPS
400.0000 mg | ORAL_CAPSULE | Freq: Three times a day (TID) | ORAL | Status: DC
  Administered 2019-01-02 (×4): 400 mg via ORAL
  Filled 2019-01-01 (×4): qty 1

## 2019-01-01 MED ORDER — POTASSIUM CHLORIDE CRYS ER 20 MEQ PO TBCR
40.0000 meq | EXTENDED_RELEASE_TABLET | ORAL | Status: AC
  Administered 2019-01-01 – 2019-01-02 (×2): 40 meq via ORAL
  Filled 2019-01-01 (×3): qty 2

## 2019-01-01 MED ORDER — ASPIRIN 300 MG RE SUPP: 300.0000 mg | Freq: Every day | RECTAL | Status: DC

## 2019-01-01 MED ORDER — SENNOSIDES-DOCUSATE SODIUM 8.6-50 MG PO TABS
1.0000 | ORAL_TABLET | Freq: Every evening | ORAL | Status: DC | PRN
  Administered 2019-01-02 – 2019-01-04 (×2): 1 via ORAL
  Filled 2019-01-01 (×2): qty 1

## 2019-01-01 MED ORDER — ACETAMINOPHEN 325 MG PO TABS
650.0000 mg | ORAL_TABLET | ORAL | Status: DC | PRN
  Administered 2019-01-04 – 2019-01-05 (×2): 650 mg via ORAL
  Filled 2019-01-01 (×2): qty 2

## 2019-01-01 MED ORDER — STROKE: EARLY STAGES OF RECOVERY BOOK
Freq: Once | Status: AC
  Administered 2019-01-01: 22:00:00
  Filled 2019-01-01: qty 1

## 2019-01-01 MED ORDER — CLOPIDOGREL BISULFATE 75 MG PO TABS
75.0000 mg | ORAL_TABLET | Freq: Every day | ORAL | Status: DC
  Administered 2019-01-02 – 2019-01-05 (×4): 75 mg via ORAL
  Filled 2019-01-01 (×4): qty 1

## 2019-01-01 MED ORDER — ACETAMINOPHEN 650 MG RE SUPP: 650.0000 mg | RECTAL | Status: DC | PRN

## 2019-01-01 MED ORDER — ENOXAPARIN SODIUM 40 MG/0.4ML ~~LOC~~ SOLN
40.0000 mg | SUBCUTANEOUS | Status: DC
  Administered 2019-01-02 – 2019-01-05 (×4): 40 mg via SUBCUTANEOUS
  Filled 2019-01-01 (×4): qty 0.4

## 2019-01-01 MED ORDER — ATORVASTATIN CALCIUM 80 MG PO TABS
80.0000 mg | ORAL_TABLET | Freq: Every day | ORAL | Status: DC
  Administered 2019-01-02 – 2019-01-05 (×5): 80 mg via ORAL
  Filled 2019-01-01 (×5): qty 1

## 2019-01-01 MED ORDER — ACETAMINOPHEN 160 MG/5ML PO SOLN: 650.0000 mg | ORAL | Status: DC | PRN

## 2019-01-01 MED ORDER — DULOXETINE HCL 30 MG PO CPEP
30.0000 mg | ORAL_CAPSULE | Freq: Every day | ORAL | Status: DC
  Administered 2019-01-02 – 2019-01-05 (×4): 30 mg via ORAL
  Filled 2019-01-01 (×4): qty 1

## 2019-01-01 MED ORDER — CILOSTAZOL 100 MG PO TABS: 100.0000 mg | ORAL_TABLET | Freq: Two times a day (BID) | ORAL | Status: DC

## 2019-01-01 NOTE — ED Notes (Signed)
Patient transported to CT 

## 2019-01-01 NOTE — ED Notes (Signed)
2 person assist to get pt from w/c to bed. Wife is with patient, they do not live together- last time she saw him normal was 1-2 weeks ago-- patient is a poor historian.

## 2019-01-01 NOTE — Plan of Care (Signed)
  Problem: Education: Goal: Knowledge of General Education information will improve Description: Including pain rating scale, medication(s)/side effects and non-pharmacologic comfort measures Outcome: Progressing   Problem: Health Behavior/Discharge Planning: Goal: Ability to manage health-related needs will improve Outcome: Progressing   Problem: Clinical Measurements: Goal: Ability to maintain clinical measurements within normal limits will improve Outcome: Progressing Goal: Will remain free from infection Outcome: Progressing Goal: Diagnostic test results will improve Outcome: Progressing Goal: Respiratory complications will improve Outcome: Progressing Goal: Cardiovascular complication will be avoided Outcome: Progressing   Problem: Activity: Goal: Risk for activity intolerance will decrease Outcome: Progressing   Problem: Nutrition: Goal: Adequate nutrition will be maintained Outcome: Progressing   Problem: Coping: Goal: Level of anxiety will decrease Outcome: Progressing   Problem: Elimination: Goal: Will not experience complications related to bowel motility Outcome: Progressing Goal: Will not experience complications related to urinary retention Outcome: Progressing   Problem: Pain Managment: Goal: General experience of comfort will improve Outcome: Progressing   Problem: Safety: Goal: Ability to remain free from injury will improve Outcome: Progressing   Problem: Skin Integrity: Goal: Risk for impaired skin integrity will decrease Outcome: Progressing   Problem: Education: Goal: Knowledge of disease or condition will improve Outcome: Progressing Goal: Knowledge of secondary prevention will improve Outcome: Progressing Goal: Knowledge of patient specific risk factors addressed and post discharge goals established will improve Outcome: Progressing Goal: Individualized Educational Video(s) Outcome: Progressing   Ival Bible, BSN, RN

## 2019-01-01 NOTE — ED Provider Notes (Signed)
[facility] EMERGENCY DEPARTMENT Provider Note   CSN: [id number] Arrival date & time: 01/01/19  1320     History   Chief Complaint Chief Complaint  Patient presents with   Weakness    HPI Stephen [name] is a 68 y.o. male.     HPI  68 year old male history of stroke, left-sided chest pain, hypertension, hyperlipidemia, alcohol abuse presents today with increased left-sided weakness that began yesterday.  Patient lives alone.  He states that his symptoms began yesterday but is unable to give me the specific course.  His wife is present in the room.  She lives in a different household.  She checks on him periodically.  She states she had no reason to think anything was wrong came by for regular check today.  She noted a left facial droop on her arrival.  He has some left-sided weakness at baseline and uses a cane.  However, now he is much more weak than baseline.  He denies any headache, head injury, fever, or chills.  He does complain of some anterior chest pain.  Is unable to quantify it or give me further description.  His wife states that he has not been eating and drinking as much as usual, but he states is because of inability to get up and get around to fix any food due to the weakness. Past Medical History:  Diagnosis Date   Alcohol abuse    Arthropathy    shoulder   Chest pain    Chronic cough    Foot pain    bilateral   Hyperlipidemia    Hypertension    Intermittent claudication (HCC)    bilateral   Latent tuberculosis    Lumbago    Shoulder pain     Patient Active Problem List   Diagnosis Date Noted   Right arm and right-sided back pain 12/09/2017   Tubular adenoma of colon 12/09/2017   Hemorrhoids 07/06/2015   Healthcare maintenance 12/19/2012   Hypokalemia 09/19/2012   History of CVA (cerebrovascular accident) 09/08/2012   GERD (gastroesophageal reflux disease) 01/28/2012   Chronic back pain s/p L5-S1 laminectomy 05/09/2011     Peripheral vascular disease ([place]) 12/10/2006   Hyperlipidemia 06/12/2006   Essential hypertension 06/12/2006    Past Surgical History:  Procedure Laterality Date   APPENDECTOMY     HAMMER TOE SURGERY     right 2nd. and 5th   LAMINECTOMY  1989-1991   left lumbar         Home Medications    Prior to Admission medications   Medication Sig Start Date End Date Taking? Authorizing Provider  amLODipine (NORVASC) 10 MG tablet TAKE ONE (1) TABLET BY MOUTH EVERY DAY Patient taking differently: Take 10 mg by mouth daily. TAKE ONE (1) TABLET BY MOUTH EVERY DAY 06/24/18   Lorella Nimrod, MD  atorvastatin (LIPITOR) 80 MG tablet TAKE ONE (1) TABLET BY MOUTH EVERY DAY AT SIX IN THE EVENING Patient taking differently: Take 80 mg by mouth daily at 6 PM. TAKE ONE (1) TABLET BY MOUTH EVERY DAY AT SIX IN THE EVENING 06/24/18   Lorella Nimrod, MD  cilostazol (PLETAL) 100 MG tablet TAKE ONE (1) TABLET BY MOUTH TWO (2) TIMES DAILY Patient not taking: Reported on 06/29/2018 12/04/17   Carroll Sage, MD  clopidogrel (PLAVIX) 75 MG tablet TAKE ONE (1) [place] DAY Patient taking differently: Take 75 mg by mouth daily.  12/04/17   Carroll Sage, MD  diclofenac sodium (VOLTAREN)  1 % GEL APPLY 4 GRAMS TO THE AFFECTED AREA FOUR TIMES DAILY 07/25/18   Lucious Groves, DO  DULoxetine (CYMBALTA) 30 MG capsule Take 1 capsule (30 mg total) by mouth daily. 10/01/18 11/30/18  Ladona Horns, MD  gabapentin (NEURONTIN) 400 MG capsule Take 1 capsule (400 mg total) by mouth 3 (three) times daily. 10/30/18   Mitzi Hansen, MD  hydrochlorothiazide (HYDRODIURIL) 25 MG tablet TAKE ONE (1) TABLET BY MOUTH EVERY DAY Patient taking differently: 25 mg daily. TAKE ONE (1) TABLET BY MOUTH EVERY DAY 06/24/18   Lorella Nimrod, MD  HYDROcodone-acetaminophen (NORCO) 5-325 MG tablet Take 1 tablet by mouth every 4 (four) hours as needed for severe pain. Patient not taking: Reported on 06/29/2018 12/27/17   Sherwood Gambler, MD   Lidocaine (HM LIDOCAINE PATCH) 4 % PTCH Apply 1 patch topically daily. 06/24/18   Lorella Nimrod, MD  naproxen (NAPROSYN) 500 MG tablet TAKE ONE (1) TABLET BY MOUTH TWO (2) TIMES DAILY WITH A MEAL AS NEEDED Patient taking differently: Take 500 mg by mouth 2 (two) times daily as needed for mild pain or moderate pain. TAKE ONE (1) TABLET BY MOUTH TWO (2) TIMES DAILY WITH A MEAL AS NEEDED 06/24/18   Lorella Nimrod, MD  pantoprazole (PROTONIX) 20 MG tablet TAKE ONE (1) TABLET BY MOUTH EVERY DAY Patient taking differently: Take 20 mg by mouth daily. TAKE ONE (1) TABLET BY MOUTH EVERY DAY 06/24/18   Lorella Nimrod, MD  predniSONE (STERAPRED UNI-PAK 21 TAB) 10 MG (21) TBPK tablet Take by mouth daily. Take 6 tabs by mouth daily  for 1 days, then 5 tabs for 2 days, then 4 tabs for 2 days, then 3 tabs for 2 days, 2 tabs for 2 days, then 1 tab by mouth daily for 2 days Patient not taking: Reported on 06/29/2018 12/28/17   Sherwood Gambler, MD    Family History Family History  Problem Relation Age of Onset   Other Unknown        there is no specific hx of coronary arterial disease/He does not know his parents history   Cancer Sister        unknown type    Social History Social History   Tobacco Use   Smoking status: Former Smoker    Types: Cigarettes    Quit date: 01/29/1990    Years since quitting: 28.9   Smokeless tobacco: Never Used  Substance Use Topics   Alcohol use: No    Alcohol/week: 0.0 standard drinks    Comment: quit alcohol approximately 20 years ago   Drug use: Yes    Frequency: 1.0 times per week    Types: Marijuana    Comment: marijuana use about once a month. Former cocaine use. quit in the 1980's     Allergies   Aspirin and Lisinopril   Review of Systems Review of Systems  All other systems reviewed and are negative.    Physical Exam Updated Vital Signs BP (!) 149/97    Pulse 99    Temp 97.7 F (36.5 C) (Oral)    Resp 16    Ht 1.956 m (6\' 5" )    Wt 68 kg    SpO2  99%    BMI 17.79 kg/m   Physical Exam Vitals signs and nursing note reviewed.  Constitutional:      Appearance: Normal appearance.  HENT:     Head: Normocephalic and atraumatic.     Right Ear: External ear normal.     Left Ear:  External ear normal.     Nose: Nose normal.     Mouth/Throat:     Mouth: Mucous membranes are moist.  Eyes:     Extraocular Movements: Extraocular movements intact.     Pupils: Pupils are equal, round, and reactive to light.  Neck:     Musculoskeletal: Normal range of motion.  Cardiovascular:     Rate and Rhythm: Normal rate and regular rhythm.  Pulmonary:     Effort: Pulmonary effort is normal.     Breath sounds: Normal breath sounds.  Abdominal:     General: Abdomen is flat. Bowel sounds are normal. There is no distension.     Palpations: Abdomen is soft. There is no mass.     Tenderness: There is no abdominal tenderness.  Musculoskeletal: Normal range of motion.  Skin:    General: Skin is warm and dry.     Capillary Refill: Capillary refill takes less than 2 seconds.  Neurological:     Mental Status: He is alert.     Cranial Nerves: Cranial nerve deficit present.     Sensory: No sensory deficit.     Motor: Weakness present.     Comments: Patient unable to lift left arm against gravity Patient unable to lift left leg against gravity Left facial droop   Psychiatric:        Mood and Affect: Mood normal.      ED Treatments / Results  Labs (all labs ordered are listed, but only abnormal results are displayed) Labs Reviewed  CBC - Abnormal; Notable for the following components:      Result Value   RDW 16.2 (*)    All other components within normal limits  BASIC METABOLIC PANEL  URINALYSIS, ROUTINE W REFLEX MICROSCOPIC  HEPATIC FUNCTION PANEL  LIPASE, BLOOD  CBG MONITORING, ED  TROPONIN I (HIGH SENSITIVITY)    EKG EKG Interpretation  Date/Time:  Thursday January 01 2019 13:24:45 EST Ventricular Rate:  90 PR Interval:  178 QRS  Duration: 92 QT Interval:  350 QTC Calculation: 428 R Axis:   70 Text Interpretation: Normal sinus rhythm Minimal voltage criteria for LVH, may be normal variant ( Sokolow-Lyon ) Nonspecific ST abnormality Abnormal ECG Confirmed by Pattricia Boss [phone]) on 01/01/2019 2:13:26 PM   Radiology Ct Head Wo Contrast  Result Date: 01/01/2019 CLINICAL DATA:  Focal neuro deficit, > 6 hrs, stroke suspected. Weakness. EXAM: CT HEAD WITHOUT CONTRAST TECHNIQUE: Contiguous axial images were obtained from the base of the skull through the vertex without intravenous contrast. COMPARISON:  Head CT 06/29/2018 FINDINGS: Brain: No intracranial hemorrhage. Generalized atrophy, unchanged from prior exam. Moderate to advanced chronic small vessel ischemia. Remote lacunar infarct in the left caudate and basal ganglia and right cerebellum. Remote cortical infarcts of the left posterior frontal and right occipital lobe. Stable hyperdense extra-axial meningioma in the right frontal region measuring approximately 10 mm, unchanged. Slight mass effect but no midline shift. No hydrocephalus. The basilar cisterns are patent. No evidence of territorial infarct or acute ischemia. No extra-axial or intracranial fluid collection. Vascular: Atherosclerosis of skullbase vasculature without hyperdense vessel or abnormal calcification. Skull: No fracture or focal lesion. Sinuses/Orbits: Paranasal sinuses and mastoid air cells are clear. The visualized orbits are unremarkable. Other: None. IMPRESSION: 1. No acute intracranial abnormality. 2. Unchanged atrophy, chronic small vessel ischemia, and remote infarcts. 3. Unchanged right frontal meningioma. Electronically Signed   By: Keith Rake M.D.   On: 01/01/2019 15:58   [facility]  Result Date: 01/01/2019 CLINICAL DATA:  Cough, chest pain EXAM: PORTABLE CHEST 1 VIEW COMPARISON:  06/29/2018 FINDINGS: The heart size and mediastinal contours are stable. No focal airspace  consolidation, pleural effusion, or pneumothorax. The visualized skeletal structures are unremarkable. IMPRESSION: No active disease. Electronically Signed   By: Davina Poke M.D.   On: 01/01/2019 14:30    Procedures Procedures (including critical care time)  Medications Ordered in ED Medications - No data to display   Initial Impression / Assessment and Plan / ED Course  I have reviewed the triage vital signs and the nursing notes.  Pertinent labs & imaging results that were available during my care of the patient were reviewed by me and considered in my medical decision making (see chart for details).                    68 yo male with increased weakness of left side that began yesterday.  Patient with prior stroke with some residual weakness but now unable to lift leg against gravity and unable to walk.   Discussed with IM teaching service and will see for admission. Neurology paged Discussed with Dr. Alvino Chapel and will assume care Emeri Mcbreen was evaluated in Emergency Department on 01/01/2019 for the symptoms described in the history of present illness. He was evaluated in the context of the global COVID-19 pandemic, which necessitated consideration that the patient might be at risk for infection with the SARS-CoV-2 virus that causes COVID-19. Institutional protocols and algorithms that pertain to the evaluation of patients at risk for COVID-19 are in a state of rapid change based on information released by regulatory bodies including the CDC and federal and state organizations. These policies and algorithms were followed during the patient's care in the ED.   Final Clinical Impressions(s) / ED Diagnoses   Final diagnoses:  Left-sided weakness    ED Discharge Orders    None       Pattricia Boss, MD 01/01/19 1640

## 2019-01-01 NOTE — H&P (Addendum)
Date: 01/01/2019               Patient Name:  Stephen [name] MRN: [id number]  DOB: [birth year] Age / Sex: 68 y.o., male   PCP: Mitzi Hansen, MD         Medical Service: Internal Medicine Teaching Service         Attending Physician: Dr. Bartholomew Crews, MD    First Contact: Dr. Marianna Payment Pager: [phone]  Second Contact: Dr. Gilberto Better Pager: [phone]       After Hours (After 5p/  First Contact Pager: [phone]  weekends / holidays): Second Contact Pager: [phone]   Chief Complaint: Chest pain  History of Present Illness:  Stephen [name] is a 68 yo M w/ PMH of PAD, CVA, stable right frontal meningioma, GERD< HLD who presents with complaint of chest pain and left sided weakness.  Stephen [name] was examined and evaluated at his bedside in ED. He was observed to be dysarthric and having difficulty with concentration and remembering his symptoms. He was AAOx2 to name, location but not to time/date. He mentions that he was in his usual state of health until two days ago (12/30/18) when he began to have acute onset L sided weakness, leg pain. He also mentions chest pain that occurred earlier today but is unable to provide further details. He mentions no inciting event and states that he feels better now but his wife, who lives separately from him, found him in bed and called EMS for him to come to the hospital.   He mentions a possible synocapal event yesterday but when pressed for further details, he is unable to provide any, repeatedly saying 'im not sure.' He denies any headache, palpitations, shortness of breath, nausea, vomiting.  Spoke with Stephen [name], his wife via phone, who states that she went to visit him earlier today and found him to be incomprehensible and unable to sit up from his chair with left sided weakness. When she saw him, he was noted to have left sided facial droop and inability to get up out of his chair which is different from his baseline. She also mentions that he has not  been taking his medications at home.  In the ED, he was found to have negative troponin and no acute findings on CT head. IM was consulted for admission for possible CVA.  Meds: No outpatient medications have been marked as taking for the 01/01/19 encounter [facility] Encounter).   Allergies: Allergies as of 01/01/2019 - Review Complete 01/01/2019  Allergen Reaction Noted  . Aspirin Other (See Comments)   . Lisinopril Other (See Comments) 09/28/2011   Past Medical History:  Diagnosis Date  . Alcohol abuse   . Arthropathy    shoulder  . Chest pain   . Chronic cough   . Foot pain    bilateral  . Hyperlipidemia   . Hypertension   . Intermittent claudication (HCC)    bilateral  . Latent tuberculosis   . Lumbago   . Shoulder pain    Family History:  Unable to provide Family History  Problem Relation Age of Onset  . Other Unknown        there is no specific hx of coronary arterial disease/He does not know his parents history  . Cancer Sister        unknown type   Social History: Lives alone. Married but lives separately with wife who only visits occasionally.  Social History   Tobacco Use  . Smoking  status: Former Smoker    Types: Cigarettes    Quit date: 01/29/1990    Years since quitting: 28.9  . Smokeless tobacco: Never Used  Substance Use Topics  . Alcohol use: No    Alcohol/week: 0.0 standard drinks    Comment: quit alcohol approximately 20 years ago  . Drug use: Yes    Frequency: 1.0 times per week    Types: Marijuana    Comment: marijuana use about once a month. Former cocaine use. quit in the 1980's   Review of Systems: A complete ROS was negative except as per HPI.  Physical Exam: Blood pressure (!) 159/101, pulse 78, temperature 97.7 F (36.5 C), temperature source Oral, resp. rate (!) 21, height 6\' 5"  (1.956 m), weight 68 kg, SpO2 99 %. Physical Exam  Constitutional: He appears unhealthy.  HENT:  Head: Atraumatic.  Eyes: Conjunctivae and EOM are  normal. No scleral icterus.  Neck: Normal range of motion.  Cardiovascular: Normal rate, regular rhythm and intact distal pulses. Exam reveals no gallop and no friction rub.  No murmur heard. Pulmonary/Chest: Breath sounds normal. Accessory muscle usage present. Tachypnea noted. No respiratory distress. He exhibits no tenderness.  Abdominal: Soft. He exhibits no distension. There is no abdominal tenderness.  Musculoskeletal: Normal range of motion.        General: No tenderness or edema.  Neurological: He is alert. He is not disoriented (mildly confused). He displays weakness (leftsided 3/5), tremor, facial asymmetry (left sided) and abnormal speech (dysarthric). No cranial nerve deficit. Coordination abnormal.  Skin: Skin is warm and dry.      EKG: personally reviewed my interpretation is normal sinus, normal axis, no ST changes  CXR: personally reviewed my interpretation is no lobar consolidation, no pleural effusion, no pulmonary edema  Assessment & Plan by Problem: Active Problems:   CVA (cerebral vascular accident) ([place])  Stephen [name] is a 68 yo M w/ PMH of PAD, CVA, stable right frontal meningioma, GERD< HLD who presents with left sided weakness  Acute vs chronic encephalopathy Unclear etiology and/or chronicity. Wife describes ongoing inability to perform IADL/ADLs at home. W/ hx of significant CVA history. Likely due to vascular dementia. Will rule out infectious etiologies. - F/u UA, CBC  Left sided weakness 2/2 CVA vs TIA Presents w/ new focal deficits. Left sided deficit on exam. Prior CVA w/ residual right sided weakness but not left. Suppose to be due clopidogrel at home. Also prescribed Pletal but per med review, not dispensed for a year. High risk for new stroke due to medication non-adherence at home. Case discussed with Dr. Rory Percy who recommend stat MRI and ruling out infectious etiology for encephalopathy. - Stat MRI/MRA -- Neurology consult after MRI -- Allow for  permissive HTN in the setting of possible CVA (systolic < XX123456 and diastolic < 123456)  -- ASA XX123456 mg / 81 mg daily, Clopidogrel 75mg  daily -- Atorvastatin 80mg  daily -- Echocardiogram  -- A1C  -- Lipid panel  -- Tele monitoring  -- SLP eval -- PT/OT  Hypokalemia K 2.8. Likely due to poor oral intake at home due to declining functioning. - K-dur 20meq x2  CAD - C/w asa, plavix  HTN - allowing permissive hypertension   Dispo: Admit patient to Inpatient with expected length of stay greater than 2 midnights.  Signed: Marianna Payment, D.O. Date 01/02/2019 Time 6:21 AM [facility] Internal Medicine, PGY-1 Pager: [phone]

## 2019-01-01 NOTE — ED Triage Notes (Signed)
Pt arrives POV for eval of generalized weakness x >24 hours. Pt and family are poor historian, but support person reports that sometime yesterday he started feeling weak, pt reports L>R. Noted w/ a L sided arm drift, L sided facial droop. Decreased grip strength on L hand. Pt denies daily ETOH use, speech is slow, slightly slurred. Unclear LKN

## 2019-01-02 ENCOUNTER — Inpatient Hospital Stay (HOSPITAL_COMMUNITY): Payer: Medicare Other

## 2019-01-02 DIAGNOSIS — R7303 Prediabetes: Secondary | ICD-10-CM

## 2019-01-02 DIAGNOSIS — I63311 Cerebral infarction due to thrombosis of right middle cerebral artery: Secondary | ICD-10-CM

## 2019-01-02 DIAGNOSIS — I6389 Other cerebral infarction: Secondary | ICD-10-CM

## 2019-01-02 DIAGNOSIS — Z8673 Personal history of transient ischemic attack (TIA), and cerebral infarction without residual deficits: Secondary | ICD-10-CM

## 2019-01-02 DIAGNOSIS — Z9119 Patient's noncompliance with other medical treatment and regimen: Secondary | ICD-10-CM

## 2019-01-02 LAB — RAPID URINE DRUG SCREEN, HOSP PERFORMED
Amphetamines: NOT DETECTED
Barbiturates: NOT DETECTED
Benzodiazepines: NOT DETECTED
Cocaine: NOT DETECTED
Opiates: NOT DETECTED
Tetrahydrocannabinol: POSITIVE — AB

## 2019-01-02 LAB — COMPREHENSIVE METABOLIC PANEL
ALT: 14 U/L (ref 0–44)
AST: 23 U/L (ref 15–41)
Albumin: 3.4 g/dL — ABNORMAL LOW (ref 3.5–5.0)
Alkaline Phosphatase: 74 U/L (ref 38–126)
Anion gap: 10 (ref 5–15)
BUN: 16 mg/dL (ref 8–23)
CO2: 23 mmol/L (ref 22–32)
Calcium: 9.1 mg/dL (ref 8.9–10.3)
Chloride: 103 mmol/L (ref 98–111)
Creatinine, Ser: 1.04 mg/dL (ref 0.61–1.24)
GFR calc Af Amer: >60 mL/min (ref >60)
GFR calc non Af Amer: >60 mL/min (ref >60)
Glucose, Bld: 115 mg/dL — ABNORMAL HIGH (ref 70–99)
Potassium: 4.1 mmol/L (ref 3.5–5.1)
Sodium: 136 mmol/L (ref 135–145)
Total Bilirubin: 0.7 mg/dL (ref 0.3–1.2)
Total Protein: 6.8 g/dL (ref 6.5–8.1)

## 2019-01-02 LAB — CBC
HCT: 39.6 % (ref 39.0–52.0)
Hemoglobin: 13.5 g/dL (ref 13.0–17.0)
MCH: 28.8 pg (ref 26.0–34.0)
MCHC: 34.1 g/dL (ref 30.0–36.0)
MCV: 84.6 fL (ref 80.0–100.0)
Platelets: 215 10*3/uL (ref 150–400)
RBC: 4.68 MIL/uL (ref 4.22–5.81)
RDW: 16.1 % — ABNORMAL HIGH (ref 11.5–15.5)
WBC: 7.9 10*3/uL (ref 4.0–10.5)
nRBC: 0 % (ref 0.0–0.2)

## 2019-01-02 LAB — LIPID PANEL
Cholesterol: 121 mg/dL (ref 0–200)
HDL: 29 mg/dL — ABNORMAL LOW (ref >40)
LDL Cholesterol: 79 mg/dL (ref 0–99)
Total CHOL/HDL Ratio: 4.2 RATIO
Triglycerides: 65 mg/dL (ref <150)
VLDL: 13 mg/dL (ref 0–40)

## 2019-01-02 LAB — GLUCOSE, CAPILLARY
Glucose-Capillary: 99 mg/dL (ref 70–99)
Glucose-Capillary: 113 mg/dL — ABNORMAL HIGH (ref 70–99)
Glucose-Capillary: 101 mg/dL — ABNORMAL HIGH (ref 70–99)

## 2019-01-02 LAB — HEMOGLOBIN A1C
Hgb A1c MFr Bld: 6.3 % — ABNORMAL HIGH (ref 4.8–5.6)
Mean Plasma Glucose: 134.11 mg/dL

## 2019-01-02 MED ORDER — ENSURE ENLIVE PO LIQD
237.0000 mL | Freq: Two times a day (BID) | ORAL | Status: DC
  Administered 2019-01-02 – 2019-01-04 (×5): 237 mL via ORAL

## 2019-01-02 MED ORDER — ASPIRIN EC 81 MG PO TBEC: 81.0000 mg | DELAYED_RELEASE_TABLET | Freq: Every day | ORAL | Status: DC

## 2019-01-02 MED ORDER — INSULIN ASPART 100 UNIT/ML ~~LOC~~ SOLN
0.0000 [IU] | Freq: Three times a day (TID) | SUBCUTANEOUS | Status: DC
  Administered 2019-01-04 – 2019-01-05 (×2): 1 [IU] via SUBCUTANEOUS
  Administered 2019-01-05: 2 [IU] via SUBCUTANEOUS

## 2019-01-02 MED ORDER — CILOSTAZOL 50 MG PO TABS
50.0000 mg | ORAL_TABLET | Freq: Two times a day (BID) | ORAL | Status: DC
  Administered 2019-01-02 – 2019-01-05 (×8): 50 mg via ORAL
  Filled 2019-01-02 (×8): qty 1

## 2019-01-02 MED ORDER — PNEUMOCOCCAL VAC POLYVALENT 25 MCG/0.5ML IJ INJ
0.5000 mL | INJECTION | INTRAMUSCULAR | Status: AC
  Administered 2019-01-03: 0.5 mL via INTRAMUSCULAR
  Filled 2019-01-02: qty 0.5

## 2019-01-02 MED ORDER — GADOBUTROL 1 MMOL/ML IV SOLN
6.6000 mL | Freq: Once | INTRAVENOUS | Status: AC | PRN
  Administered 2019-01-02: 6.6 mL via INTRAVENOUS

## 2019-01-02 MED ORDER — INFLUENZA VAC A&B SA ADJ QUAD 0.5 ML IM PRSY
0.5000 mL | PREFILLED_SYRINGE | INTRAMUSCULAR | Status: AC
  Administered 2019-01-05: 0.5 mL via INTRAMUSCULAR
  Filled 2019-01-02 (×2): qty 0.5

## 2019-01-02 MED ORDER — SODIUM CHLORIDE 0.9% FLUSH: 9.0000 mL | INTRAVENOUS | Status: DC | PRN

## 2019-01-02 NOTE — Progress Notes (Signed)
  Date: 01/02/2019  Patient name: Stephen [name]  Medical record number: [id number]  Date of birth: 10-Jan-1951        I have seen and evaluated this patient and I have discussed the plan of care with the house staff. Please see their note for complete details. I concur with their findings with the following additions/corrections: Stephen [name] was seen this morning on team rounds.  He is a 68 year old community dwelling gentleman who was admitted with left-sided weakness and found to have an acute lacunar infarct of the right posterior corona radiata and lentiform.  He was on Plavix and Pletal on admission along with atorvastatin 80 mg although there is concerned that he was noncompliant with these medications.  Of note, he has a listed allergy of aspirin.    Today, he continues to have left-sided weakness of the face, left upper and left lower extremity along with dysarthria.  He was oriented to person, place, year, but not month.  Blood pressure is 156/69 and his home amlodipine and hydrochlorothiazide have been held.  Pertinent labs Albumin 3.4 Total cholesterol 121, HDL 29, LDL 79, triglycerides 6 5 A1c 6.3 EKG which I personally reviewed showed sinus rhythm normal axis, and LVH.  Assessment and plan Stephen [name] is a 16 year old community dwelling gentleman unable to manage his medical conditions at home who now presents with a small vessel CVA, acute lacunar infarct of the right posterior corona radiata and lentiform.  He will be on dual antiplatelet therapy with Pletal and Plavix for 3 weeks and then Plavix alone.  His high intensity statin, atorvastatin 80 mg, has been resumed.  PT and OT have recommended SNF.  Disposition after SNF will depend on progress in his ability to manage at home independently as he lives alone.  Stephen Crews, MD 01/02/2019, 2:07 PM

## 2019-01-02 NOTE — Progress Notes (Signed)
Initial Nutrition Assessment  DOCUMENTATION CODES:   Not applicable  INTERVENTION:  Continue Ensure Enlive po BID, each supplement provides 350 kcal and 20 grams of protein  Encourage adequate PO intake.   NUTRITION DIAGNOSIS:   Increased nutrient needs related to acute illness as evidenced by estimated needs.  GOAL:   Patient will meet greater than or equal to 90% of their needs  MONITOR:   PO intake, Supplement acceptance, Skin, Weight trends, Labs, I & O's  REASON FOR ASSESSMENT:   Malnutrition Screening Tool    ASSESSMENT:   68 y.o. male with PMH of PAD, CVA 2014, HLD, stable R frontal meningioma, GERD who was admitted 12/3 for L sided weakness and numbness. Pt with R posterior corona radiata and lentiform nucleus infarct secondary to small vessel disease source.  Meal completion has been 75%. Pt reports having a good appetite currently and PTA with usual consumption of at least 3 meals a day. Pt unable to describe usual foods eaten at meals. Per weight records, pt with a 9% weight loss in 3 months, which is significant for time frame. Pt currently has Ensure ordered and has been consuming them. RD to continue with current orders to aid in caloric and protein needs.   NUTRITION - FOCUSED PHYSICAL EXAM:    Most Recent Value  Orbital Region  Unable to assess  Upper Arm Region  No depletion  Thoracic and Lumbar Region  No depletion  Buccal Region  Unable to assess  Temple Region  Moderate depletion  Clavicle Bone Region  Moderate depletion  Clavicle and Acromion Bone Region  Moderate depletion  Scapular Bone Region  Unable to assess  Dorsal Hand  Unable to assess  Patellar Region  Moderate depletion  Anterior Thigh Region  Moderate depletion  Posterior Calf Region  No depletion  Edema (RD Assessment)  None  Hair  Reviewed  Eyes  Reviewed  Mouth  Reviewed  Skin  Reviewed  Nails  Reviewed      Labs and medications reviewed.   Diet Order:   Diet Order         Diet Heart Room service appropriate? Yes; Fluid consistency: Thin  Diet effective now              EDUCATION NEEDS:   Not appropriate for education at this time  Skin:  Skin Assessment: Reviewed RN Assessment  Last BM:  11/30  Height:   Ht Readings from Last 1 Encounters:  01/01/19 5\' 6"  (1.676 m)    Weight:   Wt Readings from Last 1 Encounters:  01/01/19 66.2 kg    Ideal Body Weight:  64.5 kg  BMI:  Body mass index is 23.56 kg/m.  Estimated Nutritional Needs:   Kcal:  1850-2050  Protein:  85-100 grams  Fluid:  >/= 1.8 L/day    Corrin Parker, MS, RD, LDN Pager # [phone] After hours/ weekend pager # [phone]

## 2019-01-02 NOTE — Consult Note (Addendum)
Stroke Neurology Consultation Note  Consult Requested by: Internal Medicine Teaching Service  Reason for Consult: new stroke  Consult Date:  01/02/19  History of Present Illness:  Stephen [name] is an 68 y.o. African American male with PMH of PAD, CVA 2014, HLD, stable R frontal meningioma, GERD who was admitted 12/3 for L sided weakness and numbness. Found to have new onset L sided weakness 2 days prior to admission.   Pt is poor historian. It seems that he had left sided weakness for 2-3 days, and wife found him in chair and not able to get out of bed, slurry speech and incoherent, EMS called. On arrival, pt was found to have left facial droop, left arm and leg weakness and numbness. He stated that he had some chest pain initially but resolved. He denies any HA, LOC, seizure, visual changes. He denies smoking or illicit drug use.  He had stroke in 2014 with right arm leg weakness and MRI showed left BG/CR infarct. He was put on plavix and zocor on discharge. Apparently he is on pletal and plavix at home for PVD and stroke. However, he stated that he did not refill his meds so essentially he is not taking meds at home.    Date last known well: Date: 12/30/2018 Time last known well: Unable to determine tPA Given: No: out of the window MRS:  2  Past Medical History:  Diagnosis Date  . Alcohol abuse   . Arthropathy    shoulder  . Chest pain   . Chronic cough   . Foot pain    bilateral  . Hyperlipidemia   . Hypertension   . Intermittent claudication (HCC)    bilateral  . Latent tuberculosis   . Lumbago   . Shoulder pain      Past Surgical History:  Procedure Laterality Date  . APPENDECTOMY    . HAMMER TOE SURGERY     right 2nd. and 5th  . LAMINECTOMY  [id number]   left lumbar     Family History  Problem Relation Age of Onset  . Other Unknown        there is no specific hx of coronary arterial disease/He does not know his parents history  . Cancer Sister        unknown type      Social History:  reports that he quit smoking about 28 years ago. His smoking use included cigarettes. He has never used smokeless tobacco. He reports current drug use. Frequency: 1.00 time per week. Drug: Marijuana. He reports that he does not drink alcohol.  Review of Systems: A full ROS was attempted today and was  able to be performed.  Systems assessed include - Constitutional, Eyes, HENT, Respiratory, Cardiovascular, Gastrointestinal, Genitourinary, Integument/breast, Hematologic/lymphatic, Musculoskeletal, Neurological, Behavioral/Psych, Endocrine, Allergic/Immunologic - with pertinent responses as per HPI.  Allergies:  Allergies  Allergen Reactions  . Aspirin Other (See Comments)    Stomach pain/ dizziness  . Lisinopril Other (See Comments)    Acute kidney insufficiency in 2013 (Cr elevated from 1.00 to 1.79     Test Results: CBC:  Recent Labs  Lab 01/01/19 1340 01/02/19 0444  WBC 8.6 7.9  HGB 14.5 13.5  HCT 43.0 39.6  MCV 84.8 84.6  PLT 251 [id number]   Basic Metabolic Panel:  Recent Labs  Lab 01/01/19 1340 01/02/19 0444  NA 135 136  K 2.8* 4.1  CL 98 103  CO2 26 23  GLUCOSE 133* 115*  BUN 16 16  CREATININE  1.14 1.04  CALCIUM 9.7 9.1   Liver Function Tests: Recent Labs  Lab 01/01/19 1416 01/02/19 0444  AST 22 23  ALT 17 14  ALKPHOS 83 74  BILITOT 0.4 0.7  PROT 7.9 6.8  ALBUMIN 3.9 3.4*   Recent Labs  Lab 01/01/19 1416  LIPASE 30   CBG:  Recent Labs  Lab 01/01/19 1424  GLUCAP 116*   Microbiology:  Results for orders placed or performed during the hospital encounter of 01/01/19  SARS CORONAVIRUS 2 (TAT 6-24 HRS) Nasopharyngeal Nasopharyngeal Swab     Status: None   Collection Time: 01/01/19  2:16 PM   Specimen: Nasopharyngeal Swab  Result Value Ref Range Status   SARS Coronavirus 2 NEGATIVE NEGATIVE Final    Comment: (NOTE) SARS-CoV-2 target nucleic acids are NOT DETECTED. The SARS-CoV-2 RNA is generally detectable in upper and  lower respiratory specimens during the acute phase of infection. Negative results do not preclude SARS-CoV-2 infection, do not rule out co-infections with other pathogens, and should not be used as the sole basis for treatment or other patient management decisions. Negative results must be combined with clinical observations, patient history, and epidemiological information. The expected result is Negative. Fact Sheet for Patients: SugarRoll.be Fact Sheet for Healthcare Providers: https://www.woods-mathews.com/ This test is not yet approved or cleared by the [place] FDA and  has been authorized for detection and/or diagnosis of SARS-CoV-2 by FDA under an Emergency Use Authorization (EUA). This EUA will remain  in effect (meaning this test can be used) for the duration of the COVID-19 declaration under Section 56 4(b)(1) of the Act, 21 U.S.C. section 360bbb-3(b)(1), unless the authorization is terminated or revoked sooner. Performed at [facility], [place] [address] [place], [place] [address]    Lipid Panel:     Component Value Date/Time   CHOL 121 01/02/2019 0444   TRIG 65 01/02/2019 0444   HDL 29 (L) 01/02/2019 0444   CHOLHDL 4.2 01/02/2019 0444   VLDL 13 01/02/2019 0444   LDLCALC 79 01/02/2019 0444   HgbA1c:  Lab Results  Component Value Date   HGBA1C 6.3 (H) 01/02/2019    Alcohol Level:  Recent Labs  Lab 01/01/19 1416  ETH <10    Ct Head Wo Contrast  Result Date: 01/01/2019 CLINICAL DATA:  Focal neuro deficit, > 6 hrs, stroke suspected. Weakness. EXAM: CT HEAD WITHOUT CONTRAST TECHNIQUE: Contiguous axial images were obtained from the base of the skull through the vertex without intravenous contrast. COMPARISON:  Head CT 06/29/2018 FINDINGS: Brain: No intracranial hemorrhage. Generalized atrophy, unchanged from prior exam. Moderate to advanced chronic small vessel ischemia. Remote lacunar infarct in the left caudate  and basal ganglia and right cerebellum. Remote cortical infarcts of the left posterior frontal and right occipital lobe. Stable hyperdense extra-axial meningioma in the right frontal region measuring approximately 10 mm, unchanged. Slight mass effect but no midline shift. No hydrocephalus. The basilar cisterns are patent. No evidence of territorial infarct or acute ischemia. No extra-axial or intracranial fluid collection. Vascular: Atherosclerosis of skullbase vasculature without hyperdense vessel or abnormal calcification. Skull: No fracture or focal lesion. Sinuses/Orbits: Paranasal sinuses and mastoid air cells are clear. The visualized orbits are unremarkable. Other: None. IMPRESSION: 1. No acute intracranial abnormality. 2. Unchanged atrophy, chronic small vessel ischemia, and remote infarcts. 3. Unchanged right frontal meningioma. Electronically Signed   By: Keith Rake M.D.   On: 01/01/2019 15:58   Mr Angio Neck W Wo Contrast  Result Date: 01/02/2019 CLINICAL DATA:  68 year old male with abnormal speech, concentration and memory. Onset of left side weakness and leg pain on 12/30/2018. Chronic meningioma. EXAM: MRI HEAD WITHOUT CONTRAST MRA NECK WITHOUT AND WITH CONTRAST TECHNIQUE: Multiplanar, multiecho pulse sequences of the brain and surrounding structures were obtained without intravenous contrast. Angiographic images of the neck were obtained using MRA technique without and with intravenous contrast. Carotid stenosis measurements (when applicable) are obtained utilizing NASCET criteria, using the distal internal carotid diameter as the denominator. CONTRAST:  6.84mL GADAVIST GADOBUTROL 1 MMOL/ML IV SOLN COMPARISON:  Brain MRI and head and neck MRA 09/08/2012 FINDINGS: MRI HEAD FINDINGS Brain: Patchy restricted diffusion from the posterior right corona radiata tracking to the posterior right lentiform (series 5 images 65-72 and coronal series 7 image 51. Associated T2 and FLAIR hyperintensity. No  associated hemorrhage or mass effect. No other restricted diffusion. Widespread chronic lacunar infarcts in the bilateral corona radiata, bilateral deep gray matter nuclei, bilateral cerebellum. Occasional associated hemosiderin (left basal ganglia). Patchy additional bilateral cerebral white matter T2 and FLAIR hyperintensity. No cortical encephalomalacia identified. On these noncontrast images a small round right posterior frontal convexity meningioma appears stable since 2014 measuring 10 millimeters (series 17, image 15). No significant mass effect. No associated edema. No midline shift, ventriculomegaly, extra-axial collection or acute intracranial hemorrhage. Cervicomedullary junction and pituitary are within normal limits. Vascular: Major intracranial vascular flow voids are stable since 2014. Skull and upper cervical spine: Partially visible multilevel cervical spine degeneration. Normal background bone marrow signal. Sinuses/Orbits: Negative orbits.  Paranasal sinuses are clear. Other: Mastoid air cells are clear. Visible internal auditory structures appear normal. Scalp and face soft tissues appear negative. MRA NECK FINDINGS Pre contrast 3D time-of-flight MRA images. Preserved antegrade flow in the bilateral cervical carotids and the left vertebral artery. Chronically absent antegrade flow signal in the right vertebral artery unchanged since 2014. The carotid bifurcations appear within normal limits. No hemodynamically significant stenosis is evident. Post-contrast neck MRA images. Three vessel arch configuration. Great vessel origins appear normal. Right CCA, right carotid bifurcation and cervical right ICA appear normal. Grossly normal visible right anterior circulation (dominant left and diminutive or absent right ACA A1 segments again noted. Negative left CCA, left carotid bifurcation and cervical left ICA. Grossly negative visible left anterior circulation. No proximal subclavian artery stenosis.  Normal left vertebral artery origin. Patent left vertebral artery to the skull base with mild tortuosity but no stenosis. Occluded right vertebral artery without enhancement, until probable retrograde faint reconstitution of the right V4 segment in the posterior fossa. Visible posterior circulation appears grossly stable since 2014. IMPRESSION: 1. Acute lacunar infarct of the right posterior corona radiata and lentiform. No associated hemorrhage or mass effect. 2. Underlying advanced chronic small vessel disease. 3. 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 Some retrograde reconstitution of the right V4 segment again noted. 4. Small chronic right posterior frontal convexity meningioma unchanged since 2014. Electronically Signed   By: Genevie Ann M.D.   On: 01/02/2019 02:51   Mr Jeri Cos [id number] Contrast  Result Date: 01/02/2019 CLINICAL DATA:  68 year old male with abnormal speech, concentration and memory. Onset of left side weakness and leg pain on 12/30/2018. Chronic meningioma. EXAM: MRI HEAD WITHOUT CONTRAST MRA NECK WITHOUT AND WITH CONTRAST TECHNIQUE: Multiplanar, multiecho pulse sequences of the brain and surrounding structures were obtained without intravenous contrast. Angiographic images of the neck were obtained using MRA technique without and with intravenous contrast. Carotid  stenosis measurements (when applicable) are obtained utilizing NASCET criteria, using the distal internal carotid diameter as the denominator. CONTRAST:  6.34mL GADAVIST GADOBUTROL 1 MMOL/ML IV SOLN COMPARISON:  Brain MRI and head and neck MRA 09/08/2012 FINDINGS: MRI HEAD FINDINGS Brain: Patchy restricted diffusion from the posterior right corona radiata tracking to the posterior right lentiform (series 5 images 65-72 and coronal series 7 image 51. Associated T2 and FLAIR hyperintensity. No associated hemorrhage or mass effect. No other restricted diffusion.  Widespread chronic lacunar infarcts in the bilateral corona radiata, bilateral deep gray matter nuclei, bilateral cerebellum. Occasional associated hemosiderin (left basal ganglia). Patchy additional bilateral cerebral white matter T2 and FLAIR hyperintensity. No cortical encephalomalacia identified. On these noncontrast images a small round right posterior frontal convexity meningioma appears stable since 2014 measuring 10 millimeters (series 17, image 15). No significant mass effect. No associated edema. No midline shift, ventriculomegaly, extra-axial collection or acute intracranial hemorrhage. Cervicomedullary junction and pituitary are within normal limits. Vascular: Major intracranial vascular flow voids are stable since 2014. Skull and upper cervical spine: Partially visible multilevel cervical spine degeneration. Normal background bone marrow signal. Sinuses/Orbits: Negative orbits.  Paranasal sinuses are clear. Other: Mastoid air cells are clear. Visible internal auditory structures appear normal. Scalp and face soft tissues appear negative. MRA NECK FINDINGS Pre contrast 3D time-of-flight MRA images. Preserved antegrade flow in the bilateral cervical carotids and the left vertebral artery. Chronically absent antegrade flow signal in the right vertebral artery unchanged since 2014. The carotid bifurcations appear within normal limits. No hemodynamically significant stenosis is evident. Post-contrast neck MRA images. Three vessel arch configuration. Great vessel origins appear normal. Right CCA, right carotid bifurcation and cervical right ICA appear normal. Grossly normal visible right anterior circulation (dominant left and diminutive or absent right ACA A1 segments again noted. Negative left CCA, left carotid bifurcation and cervical left ICA. Grossly negative visible left anterior circulation. No proximal subclavian artery stenosis. Normal left vertebral artery origin. Patent left vertebral artery to the  skull base with mild tortuosity but no stenosis. Occluded right vertebral artery without enhancement, until probable retrograde faint reconstitution of the right V4 segment in the posterior fossa. Visible posterior circulation appears grossly stable since 2014. IMPRESSION: 1. Acute lacunar infarct of the right posterior corona radiata and lentiform. No associated hemorrhage or mass effect. 2. Underlying advanced chronic small vessel disease. 3. 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 Some retrograde reconstitution of the right V4 segment again noted. 4. Small chronic right posterior frontal convexity meningioma unchanged since 2014. Electronically Signed   By: Genevie Ann M.D.   On: 01/02/2019 02:51   Dg Chest Port 1 View  Result Date: 01/01/2019 CLINICAL DATA:  Cough, chest pain EXAM: PORTABLE CHEST 1 VIEW COMPARISON:  06/29/2018 FINDINGS: The heart size and mediastinal contours are stable. No focal airspace consolidation, pleural effusion, or pneumothorax. The visualized skeletal structures are unremarkable. IMPRESSION: No active disease. Electronically Signed   By: Davina Poke M.D.   On: 01/01/2019 14:30     EKG: normal sinus rhythm.   Physical Examination: Temp:  [97.7 F (36.5 C)-98.2 F (36.8 C)] 97.8 F (36.6 C) (12/04 0911) Pulse Rate:  [56-99] 70 (12/04 0911) Resp:  [16-21] 18 (12/04 0911) BP: (122-159)/(63-101) 159/77 (12/04 0911) SpO2:  [98 %-100 %] 100 % (12/04 0911) Weight:  [66.2 kg-68 kg] 66.2 kg (12/03 2000)  General - Well nourished, well developed, in no apparent distress.  Ophthalmologic - fundi not visualized due to noncooperation.  Cardiovascular - Regular rate and rhythm.  Mental Status -  Level of arousal and orientation to time, place, and person were intact, however, not orientated to his age. Language including expression, naming, repetition, comprehension was assessed and found intact.  Mild to moderate dysarthria  Cranial Nerves II - XII - II - Visual field intact OU. III, IV, VI - Extraocular movements intact. V - Facial sensation decreased on the left. VII - left facial droop. VIII - Hearing & vestibular intact bilaterally. X - Palate elevates symmetrically, mild to moderate dysarthria. XI - Chin turning & shoulder shrug intact bilaterally. XII - Tongue protrusion intact.  Motor Strength - The patient's strength was normal in RUE and RLE, however LUE 2+/5 and LLE 3/5.  Bulk was normal and fasciculations were absent.   Motor Tone - Muscle tone was assessed at the neck and appendages and was normal.  Reflexes - The patient's reflexes were symmetrical in all extremities and he had no pathological reflexes.  Sensory - Light touch, temperature/pinprick were assessed and were decreased on the left.    Coordination - The patient had normal movements in the right hand with no ataxia or dysmetria.  Tremor was absent.  Gait and Station - deferred.    Assessment:  [name] is a 68 y.o. male with history of PAD, CVA, HLD, stable R frontal meningioma, GERD admitted with Chest Pain and L sised weakness. MRI showed acute stroke. He did not receive IV t-PA due to being out of the window.   Stroke:   R posterior corona radiata and lentiform nucleus infarct secondary to small vessel disease source  CT head No acute stroke. Small vessel disease. Atrophy. Unchanged R frontal meningioma.     MRI  R posterior corona radiata and lentiform nucleus infarct. Advanced Small vessel disease. 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 Continue DAPT x 3 weeks then plavix alone  (ASA allergy reported)  Therapy recommendations:  SNF   Disposition:  pending   Hx of stroke  08/2012 admitted for right arm and leg and facial weakness with numbness, difficulty walking. MRI left BG/CR infarct. CUS and TTE unremarkable. MRA right VA occlusion, left A2 stenosis - LDL 110 and A1C 6.2. he was discharged with plavix and zocor.   Hypertension  Stable . Permissive hypertension (OK if < 220/120) but gradually normalize in 5-7 days . Long-term BP goal normotensive  Hyperlipidemia  Home meds:  lipitor 80, resumed in hospital  LDL 79, goal < 70  Continue statin at discharge  Other Stroke Risk Factors  Advanced age  Former Cigarette smoker, quit 29 yrs ago  Hx ETOH abuse, advised to drink no more than 2 drink(s) a day  marijuana user about once a month, UDS not perfomed  Hx cocaine use, UDS positive for THC this time, educated for cessation  PAD not compliant with meds  Other Active Problems  Hypokalemia, resolved 2.8->4.1   Hospital day # 1   Neurology will sign off. Please call with questions. Pt will follow up with stroke clinic NP at [facility] in about 4 weeks.  Thank you for this consultation and allowing [place] to participate in the care of this patient.  Rosalin Hawking, MD PhD Stroke Neurology 01/02/2019 12:15 PM    To contact Stroke Continuity provider, please refer to [email or web address] After hours, contact General Neurology

## 2019-01-02 NOTE — Progress Notes (Signed)
OT Cancellation Note  Patient Details Name: Stephen [name] MRN: [id number] DOB: Apr 14, 1950   Cancelled Treatment:    Reason Eval/Treat Not Completed: Patient at procedure or test/ unavailable Will attempt to return at later time as pt availability allows.  Simonne Come 01/02/2019, 8:40 AM

## 2019-01-02 NOTE — Evaluation (Signed)
Speech Language Pathology Evaluation Patient Details Name: Stephen [name] MRN: [id number] DOB: [birth year] Today's Date: 01/02/2019 Time: [id number] SLP Time Calculation (min) (ACUTE ONLY): 13 min  Problem List:  Patient Active Problem List   Diagnosis Date Noted  . CVA (cerebral vascular accident) ([place]) 01/01/2019  . Right arm and right-sided back pain 12/09/2017  . Tubular adenoma of colon 12/09/2017  . Hemorrhoids 07/06/2015  . Healthcare maintenance 12/19/2012  . Hypokalemia 09/19/2012  . History of CVA (cerebrovascular accident) 09/08/2012  . GERD (gastroesophageal reflux disease) 01/28/2012  . Chronic back pain s/p L5-S1 laminectomy 05/09/2011  . Peripheral vascular disease ([place]) 12/10/2006  . Hyperlipidemia 06/12/2006  . Essential hypertension 06/12/2006   Past Medical History:  Past Medical History:  Diagnosis Date  . Alcohol abuse   . Arthropathy    shoulder  . Chest pain   . Chronic cough   . Foot pain    bilateral  . Hyperlipidemia   . Hypertension   . Intermittent claudication (HCC)    bilateral  . Latent tuberculosis   . Lumbago   . Shoulder pain    Past Surgical History:  Past Surgical History:  Procedure Laterality Date  . APPENDECTOMY    . HAMMER TOE SURGERY     right 2nd. and 5th  . LAMINECTOMY  [id number]   left lumbar    HPI:  Pt is a 68 y/o M admitted on 01/01/19 for chest pain & L sided weakness.  PMH significant for PAD, CVA with residual R sided weakness, stable R frontal meningioma, GERD, HLD, HTN, & intermittent claudication. MRI revealed acute lacunar infarct of the right posterior corona radiata and   Assessment / Plan / Recommendation Clinical Impression  Pt presents with cognitive skills that suggest pt would benefit from assistance at discharge. Baseline cognitive function is unknown and per pt's description of himself and tasks that he performed it would appear that pt had baseline deficits. At baseline, pt has a wife but she doesn't reside  in the same house with him. Pt's wife pays all of his bills and frequently performs tasks such as obtaining groceries and pt's medication but pt is evasive about whether he takes them appropriately. Pt's speech is imprecise but his is able to communicate his wants and needs (such as wanting to return to bed instead of sitting in the recliner). Pt presents with current deficits in awareness (specifically safety awareness), orientation, decreased task initiation and overall slow processing. At this time, ST recommends SNF for further care. ST to defer further treatment to this venue of care.     SLP Assessment  SLP Recommendation/Assessment: All further Speech Lanaguage Pathology  needs can be addressed in the next venue of care SLP Visit Diagnosis: Cognitive communication deficit (R41.841)    Follow Up Recommendations  Skilled Nursing facility    Frequency and Duration   N/A        SLP Evaluation Cognition  Overall Cognitive Status: No family/caregiver present to determine baseline cognitive functioning Arousal/Alertness: Awake/alert Orientation Level: Oriented to person;Oriented to place;Oriented to situation;Disoriented to time Attention: Sustained Sustained Attention: Impaired Sustained Attention Impairment: Verbal basic;Functional basic Awareness: Impaired Awareness Impairment: Intellectual impairment Problem Solving: Impaired Problem Solving Impairment: Verbal basic;Functional basic Behaviors: Restless Safety/Judgment: Impaired       Comprehension  Auditory Comprehension Overall Auditory Comprehension: Appears within functional limits for tasks assessed    Expression Expression Primary Mode of Expression: Verbal Verbal Expression Overall Verbal Expression: Appears within functional limits for tasks assessed Written Expression  Dominant Hand: Right Written Expression: Not tested   Oral / Motor  Oral Motor/Sensory Function Overall Oral Motor/Sensory Function: Moderate  impairment Facial ROM: Reduced left Facial Strength: Reduced left Lingual ROM: Reduced left Lingual Symmetry: Abnormal symmetry left Lingual Strength: Reduced Motor Speech Overall Motor Speech: Impaired Respiration: Within functional limits Phonation: Normal Articulation: Impaired Level of Impairment: Sentence Intelligibility: Intelligibility reduced Word: 75-100% accurate Phrase: 50-74% accurate Sentence: 50-74% accurate Conversation: 0-24% accurate Motor Planning: Witnin functional limits Motor Speech Errors: Not applicable Effective Techniques: Slow rate;Increased vocal intensity   GO                    Stephen [name] 01/02/2019, 5:48 PM

## 2019-01-02 NOTE — Progress Notes (Signed)
RN attempted to ambulate with Pt to check oximetry but pt very weak on the left side and 2 people to hold to try to ambulate. Pt noted to be dragging the left side and unable to assess the oximetry with ambulation. Pt laid back to bed with second RN assistance as pt noted to be leaning mostly to the left side and about to fall. Stephen Heady RN

## 2019-01-02 NOTE — Evaluation (Addendum)
Physical Therapy Evaluation Patient Details Name: Stephen [name] MRN: [id number] DOB: 08-14-1950 Today's Date: 01/02/2019   History of Present Illness  Pt is a 68 y/o M admitted on 01/01/19 for chest pain & L sided weakness.  PMH significant for PAD, CVA with residual R sided weakness, stable R frontal meningioma, GERD, HLD, HTN, & intermittent claudication.    Clinical Impression  Pt currently requires significant assistance (up to max for stand pivot transfers) for functional mobility 2/2 new L sided deficits. Pt will require 24 hr assistance upon d/c & after discussion pt is aware of this. Pt will benefit from ongoing acute PT services to focus on bed mobility, transfers, gait with LRAD, & L NMR to increase safety & independence with all functional mobility tasks.   Addendum: Pt reports he wears glasses at all times at baseline but does not have them with him currently. When asked if his vision has changed (blurry or diplopia) pt cites both but this not observed during session. Will continue to assess pt's vision in a functional context.      Follow Up Recommendations SNF;Supervision/Assistance - 24 hour    Equipment Recommendations  (TBD in next venue)    Recommendations for Other Services Speech consult     Precautions / Restrictions Precautions Precautions: Fall Precaution Comments: new L hemi, old R hemi Restrictions Weight Bearing Restrictions: No      Mobility  Bed Mobility Overal bed mobility: Needs Assistance Bed Mobility: Sit to Supine       Sit to supine: Mod assist;HOB elevated   General bed mobility comments: pt requires extra time to advance BLE to EOB (LLE requires more time than RLE), assistance with fully turning & scooting to EOB  Transfers Overall transfer level: Needs assistance Equipment used: Rolling walker (2 wheeled) Transfers: Sit to/from [facility] Sit to Stand: Mod assist Stand pivot transfers: Max assist       General  transfer comment: pt requires cuing for safe hand placement, pt with significant difficulty to advance LLE to step towards recliner on L, pt requires max assist to control descent during stand>sit  Ambulation/Gait                Stairs            Wheelchair Mobility    Modified Rankin (Stroke Patients Only) Modified Rankin (Stroke Patients Only) Pre-Morbid Rankin Score: Slight disability Modified Rankin: Severe disability     Balance Overall balance assessment: Needs assistance Sitting-balance support: Feet unsupported;Bilateral upper extremity supported Sitting balance-Leahy Scale: Poor Sitting balance - Comments: posterior LOB while sitting EOB without back support   Standing balance support: During functional activity;Bilateral upper extremity supported Standing balance-Leahy Scale: Poor Standing balance comment: max assist when standing/transferring with BUE on RW                             Pertinent Vitals/Pain Pain Assessment: No/denies pain    Home Living Family/patient expects to be discharged to:: Private residence Living Arrangements: Alone Available Help at Discharge: Family(wife checks on him intermittently, pt reports he has other family in the area) Type of Home: House Home Access: Stairs to enter Entrance Stairs-Rails: Left;Right;Can reach both(pt speaks as if the rails are not in good shape) Entrance Stairs-Number of Steps: 4 Home Layout: One level Home Equipment: Cane - single point Additional Comments: pt has been using [facility] for years    Prior Function Level of Independence: Independent  with assistive device(s)               Hand Dominance   Dominant Hand: Right    Extremity/Trunk Assessment   Upper Extremity Assessment Upper Extremity Assessment: (impaired L shoulder flexion & LUE grasp)    Lower Extremity Assessment Lower Extremity Assessment: Generalized weakness(impaired L hip flexion & knee  flexion/extension)    Cervical / Trunk Assessment Cervical / Trunk Assessment: (posterior pelvic tilt, rounded shoulders, slightly forward head)  Communication   Communication: Expressive difficulties  Cognition Arousal/Alertness: Awake/alert   Overall Cognitive Status: Impaired/Different from baseline Area of Impairment: Orientation;Safety/judgement;Awareness;Problem solving                 Orientation Level: Disoriented to;Situation       Safety/Judgement: Decreased awareness of deficits;Decreased awareness of safety Awareness: Anticipatory Problem Solving: Slow processing;Decreased initiation;Difficulty sequencing        General Comments General comments (skin integrity, edema, etc.): pt reports fatigue & appears to have significant physical exertion after stand pivot to recliner with RW but HR = 84bpm & decreases to 73 bpm    Exercises     Assessment/Plan    PT Assessment Patient needs continued PT services  PT Problem List Decreased strength;Decreased balance;Decreased cognition;Cardiopulmonary status limiting activity;Decreased knowledge of use of DME;Decreased mobility;Decreased range of motion;Decreased activity tolerance;Decreased coordination;Decreased safety awareness;Impaired sensation       PT Treatment Interventions DME instruction;Therapeutic activities;Cognitive remediation;Patient/family education;Therapeutic exercise;Gait training;Modalities;Stair training;Balance training;Wheelchair mobility training;Manual techniques;Neuromuscular re-education;Functional mobility training    PT Goals (Current goals can be found in the Care Plan section)  Acute Rehab PT Goals Patient Stated Goal: none stated PT Goal Formulation: With patient Time For Goal Achievement: 01/16/19 Potential to Achieve Goals: Fair    Frequency Min 3X/week   Barriers to discharge Decreased caregiver support pt does not have 24 hr support at home    Co-evaluation                AM-PAC PT "6 Clicks" Mobility  Outcome Measure Help needed turning from your back to your side while in a flat bed without using bedrails?: A Little Help needed moving from lying on your back to sitting on the side of a flat bed without using bedrails?: A Little Help needed moving to and from a bed to a chair (including a wheelchair)?: A Lot Help needed standing up from a chair using your arms (e.g., wheelchair or bedside chair)?: A Lot Help needed to walk in hospital room?: A Lot Help needed climbing 3-5 steps with a railing? : Total 6 Click Score: 13    End of Session Equipment Utilized During Treatment: Gait belt Activity Tolerance: Patient tolerated treatment well Patient left: in chair;with call bell/phone within reach;with chair alarm set Nurse Communication: Mobility status PT Visit Diagnosis: Other abnormalities of gait and mobility (R26.89);Muscle weakness (generalized) (M62.81);Difficulty in walking, not elsewhere classified (R26.2)    Time: [id number] PT Time Calculation (min) (ACUTE ONLY): 21 min   Charges:   PT Evaluation $PT Eval Moderate Complexity: [place], PT, DPT 01/02/2019, 11:59 AM

## 2019-01-02 NOTE — TOC Initial Note (Signed)
Transition of Care [facility]) - Initial/Assessment Note    Patient Details  Name: Stephen [name] MRN: [id number] Date of Birth: 1950-11-19  Transition of Care [facility]) CM/SW Contact:    Pollie Friar, RN Phone Number: 01/02/2019, 4:32 PM  Clinical Narrative:                 CM met with the patient and his wife. They are interested in patient going to SNF rehab prior to home. They asked he be faxed out in the [facility] area. FL2 done.  Pt will need insurance auth through [facility].  Expected Discharge Plan: Skilled Nursing Facility Barriers to Discharge: Continued Medical Work up   Patient Goals and CMS Choice   CMS Medicare.gov Compare Post Acute Care list provided to:: Patient Choice offered to / list presented to : Patient, Spouse  Expected Discharge Plan and Services Expected Discharge Plan: [place] In-house Referral: Clinical Social Work Discharge Planning Services: CM Consult Post Acute Care Choice: [place] arrangements for the past 2 months: [place]                                      Prior Living Arrangements/Services Living arrangements for the past 2 months: Single Family Home Lives with:: Spouse Patient language and need for interpreter reviewed:: Yes Do you feel safe going back to the place where you live?: Yes      Need for Family Participation in Patient Care: Yes (Comment) Care giver support system in place?: No (comment) Current home services: DME(cane) Criminal Activity/Legal Involvement Pertinent to Current Situation/Hospitalization: No - Comment as needed  Activities of Daily Living Home Assistive Devices/Equipment: Cane (specify quad or straight) ADL Screening (condition at time of admission) Patient's cognitive ability adequate to safely complete daily activities?: Yes Is the patient deaf or have difficulty hearing?: No Does the patient have difficulty seeing, even when wearing  glasses/contacts?: No Does the patient have difficulty concentrating, remembering, or making decisions?: Yes Patient able to express need for assistance with ADLs?: Yes Does the patient have difficulty dressing or bathing?: Yes Independently performs ADLs?: No Communication: Independent Dressing (OT): Needs assistance Is this a change from baseline?: Change from baseline, expected to last <3days Grooming: Independent Feeding: Independent Bathing: Independent with device (comment) Toileting: Independent In/Out Bed: Independent with device (comment) Walks in Home: Independent with device (comment) Does the patient have difficulty walking or climbing stairs?: Yes Weakness of Legs: Both Weakness of Arms/Hands: Left  Permission Sought/Granted                  Emotional Assessment Appearance:: Appears stated age Attitude/Demeanor/Rapport: Engaged Affect (typically observed): Accepting, Pleasant Orientation: : Oriented to Self, Oriented to Place, Oriented to Situation   Psych Involvement: No (comment)  Admission diagnosis:  Left-sided weakness [R53.1] Patient Active Problem List   Diagnosis Date Noted  . CVA (cerebral vascular accident) ([place]) 01/01/2019  . Right arm and right-sided back pain 12/09/2017  . Tubular adenoma of colon 12/09/2017  . Hemorrhoids 07/06/2015  . Healthcare maintenance 12/19/2012  . Hypokalemia 09/19/2012  . History of CVA (cerebrovascular accident) 09/08/2012  . GERD (gastroesophageal reflux disease) 01/28/2012  . Chronic back pain s/p L5-S1 laminectomy 05/09/2011  . Peripheral vascular disease ([place]) 12/10/2006  . Hyperlipidemia 06/12/2006  . Essential hypertension 06/12/2006   PCP:  Mitzi Hansen, MD Pharmacy:   [id number] [place] [address] [place] [address] Phone: [phone] Fax: [phone]  [id number] [place] [address] [place]Phone: [phone]: [phone]    Social Determinants of Health (SDOH) Interventions    Readmission Risk Interventions No flowsheet data found.

## 2019-01-02 NOTE — Progress Notes (Signed)
  Echocardiogram 2D Echocardiogram has been performed.  Stephen [name] 01/02/2019, 8:49 AM

## 2019-01-02 NOTE — NC FL2 (Signed)
[place] LEVEL OF CARE SCREENING TOOL     IDENTIFICATION  Patient Name: Stephen [name] Birthdate: [birth year] Sex: male Admission Date (Current Location): 01/01/2019  [facility] and [place] Number:  Herbalist and Address:  [facility] [place], [place] [address] [place], [place] [address]      Provider Number: [id number]  Attending Physician Name and Address:  Bartholomew Crews, MD  Relative Name and Phone Number:       Current Level of Care: Hospital Recommended Level of Care: [place] Prior Approval Number:    Date Approved/Denied:   PASRR Number: [id number]  Discharge Plan: SNF    Current Diagnoses: Patient Active Problem List   Diagnosis Date Noted  . CVA (cerebral vascular accident) ([place]) 01/01/2019  . Right arm and right-sided back pain 12/09/2017  . Tubular adenoma of colon 12/09/2017  . Hemorrhoids 07/06/2015  . Healthcare maintenance 12/19/2012  . Hypokalemia 09/19/2012  . History of CVA (cerebrovascular accident) 09/08/2012  . GERD (gastroesophageal reflux disease) 01/28/2012  . Chronic back pain s/p L5-S1 laminectomy 05/09/2011  . Peripheral vascular disease ([place]) 12/10/2006  . Hyperlipidemia 06/12/2006  . Essential hypertension 06/12/2006    Orientation RESPIRATION BLADDER Height & Weight     Self, Situation, Place  Normal Continent Weight: 66.2 kg Height:  5\' 6"  (167.6 cm)  BEHAVIORAL SYMPTOMS/MOOD NEUROLOGICAL BOWEL NUTRITION STATUS      Continent Diet(Heart Healthy)  AMBULATORY STATUS COMMUNICATION OF NEEDS Skin   Extensive Assist Verbally Normal                       Personal Care Assistance Level of Assistance  Bathing, Feeding, Dressing Bathing Assistance: Maximum assistance Feeding assistance: Limited assistance Dressing Assistance: Limited assistance     Functional Limitations Info  Sight, Hearing, Speech Sight Info: Adequate Hearing Info: Adequate Speech Info:  Impaired(expressive aphasia)    SPECIAL CARE FACTORS FREQUENCY  PT (By licensed PT), OT (By licensed OT), Speech therapy     PT Frequency: 5x/wk OT Frequency: 5x/wk     Speech Therapy Frequency: 5x/wk      Contractures Contractures Info: Not present    Additional Factors Info  Code Status, Allergies, Psychotropic, Insulin Sliding Scale Code Status Info: Partial code Allergies Info: Aspirin and lisinopril Psychotropic Info: Cymbalta DR 30 mg daily/ Neurontin 400 mg TID Insulin Sliding Scale Info: Novolog 0-9 units SQ three times a day       Current Medications (01/02/2019):  This is the current hospital active medication list Current Facility-Administered Medications  Medication Dose Route Frequency Provider Last Rate Last Dose  . acetaminophen (TYLENOL) tablet 650 mg  650 mg Oral Q4H PRN Marianna Payment, MD       Or  . acetaminophen (TYLENOL) 160 MG/5ML solution 650 mg  650 mg Per Tube Q4H PRN Marianna Payment, MD       Or  . acetaminophen (TYLENOL) suppository 650 mg  650 mg Rectal Q4H PRN Marianna Payment, MD      . atorvastatin (LIPITOR) tablet 80 mg  80 mg Oral q1800 Marianna Payment, MD   80 mg at 01/02/19 1624  . cilostazol (PLETAL) tablet 50 mg  50 mg Oral BID Rosalin Hawking, MD   50 mg at 01/02/19 1317  . clopidogrel (PLAVIX) tablet 75 mg  75 mg Oral Daily Marianna Payment, MD   75 mg at 01/02/19 0847  . DULoxetine (CYMBALTA) DR capsule 30 mg  30 mg Oral Daily Coe,  Marland Kitchen, MD   30 mg at 01/02/19 0846  . enoxaparin (LOVENOX) injection 40 mg  40 mg Subcutaneous Q24H Marianna Payment, MD   40 mg at 01/02/19 1625  . feeding supplement (ENSURE ENLIVE) (ENSURE ENLIVE) liquid 237 mL  237 mL Oral BID BM Bartholomew Crews, MD   237 mL at 01/02/19 1516  . gabapentin (NEURONTIN) capsule 400 mg  400 mg Oral TID Marianna Payment, MD   400 mg at 01/02/19 1624  . [START ON 01/03/2019] influenza vaccine adjuvanted (FLUAD) injection 0.5 mL  0.5 mL Intramuscular Tomorrow-1000 Larey Dresser A, MD       . insulin aspart (novoLOG) injection 0-9 Units  0-9 Units Subcutaneous TID WC Mosetta Anis, MD      . Derrill Memo ON 01/03/2019] pneumococcal 23 valent vaccine (PNEUMOVAX-23) injection 0.5 mL  0.5 mL Intramuscular Tomorrow-1000 Bartholomew Crews, MD      . senna-docusate (Senokot-S) tablet 1 tablet  1 tablet Oral QHS PRN Marianna Payment, MD   1 tablet at 01/02/19 0848  . sodium chloride flush (NS) 0.9 % injection 9 mL  9 mL Intravenous PRN Bartholomew Crews, MD         Discharge Medications: Please see discharge summary for a list of discharge medications.  Relevant Imaging Results:  Relevant Lab Results:   Additional Information SS#: [id number]  Pollie Friar, RN

## 2019-01-02 NOTE — Evaluation (Signed)
Occupational Therapy Evaluation Patient Details Name: Stephen [name] MRN: [id number] DOB: 1950/05/01 Today's Date: 01/02/2019    History of Present Illness Pt is a 68 y/o M admitted on 01/01/19 for chest pain & L sided weakness.  PMH significant for PAD, CVA with residual R sided weakness, stable R frontal meningioma, GERD, HLD, HTN, & intermittent claudication.   Clinical Impression   Pt admitted with above and presents to OT with impairments impacting ability to complete ADLs at [facility].  Pt required max assist for sit <> stand and stand pivot transfers due to LLE weakness and difficulty with sequencing during functional mobility.  Unable to maintain standing to allow therapist to assist with hygiene, would benefit from +2 to complete hygiene and clothing management.  O2 sats monitored during session.  Pt 93% on room air upon arrival, post standing sats 98% on room air and maintained 97-98 although breathing sounding labored.  Unable to complete any ambulation as unsafe at this time.  Pt will benefit from OT acutely to address impairments below and increase independence with ADLs and functional mobility.  Recommend SNF level therapist when medically stable to continue to address ADLs and functional mobility prior to d/c home.    Follow Up Recommendations  SNF    Equipment Recommendations  (defer to next venue)       Precautions / Restrictions Precautions Precautions: Fall Precaution Comments: new L hemi, old R hemi Restrictions Weight Bearing Restrictions: No      Mobility Bed Mobility Overal bed mobility: Needs Assistance Bed Mobility: Sit to Supine       Sit to supine: Mod assist;HOB elevated   General bed mobility comments: pt requires extra time to advance BLE to EOB (LLE requires more time than RLE), assistance with fully turning & scooting to EOB  Transfers Overall transfer level: Needs assistance Equipment used: Rolling walker (2 wheeled) Transfers: Sit to/from [employer] Sit to Stand: Mod assist Stand pivot transfers: Max assist       General transfer comment: Pt requires max assist sit <> stand as he requires cues for hand placement and assist to control descent    Balance Overall balance assessment: Needs assistance Sitting-balance support: Feet unsupported;Bilateral upper extremity supported Sitting balance-Leahy Scale: Poor Sitting balance - Comments: posterior LOB while sitting EOB without back support   Standing balance support: During functional activity;Bilateral upper extremity supported Standing balance-Leahy Scale: Poor Standing balance comment: max assist when standing/transferring with BUE on RW                           ADL either performed or assessed with clinical judgement   ADL Overall ADL's : Needs assistance/impaired Eating/Feeding: Minimal assistance   Grooming: Wash/dry hands;Wash/dry face;Minimal assistance;Sitting Grooming Details (indicate cue type and reason): assist to wash Lt hand, decreased grasp impacting ability to utilize LUE Upper Body Bathing: Minimal assistance   Lower Body Bathing: Maximal assistance;Sit to/from stand   Upper Body Dressing : Minimal assistance   Lower Body Dressing: Maximal assistance;Sit to/from stand;+2 for physical assistance   Toilet Transfer: Maximal assistance;Stand-pivot;BSC;RW Toilet Transfer Details (indicate cue type and reason): Max assist stand pivot transfer to recliner to simulate [facility] Toileting- Clothing Manipulation and Hygiene: Maximal assistance;+2 for physical assistance       Functional mobility during ADLs: Maximal assistance;+2 for physical assistance;Rolling walker General ADL Comments: Max assist for standing balance, +2 to complete clothing/hygiene in standing due to decreased stability in standing even  with UE support on RW     Vision Baseline Vision/History: Wears glasses Wears Glasses: At all times Patient Visual Report: Blurring  of vision Vision Assessment?: Vision impaired- to be further tested in functional context Additional Comments: difficult to fully assess due to inconsistent report with assessment.            Pertinent Vitals/Pain Pain Assessment: No/denies pain     Hand Dominance Right   Extremity/Trunk Assessment Upper Extremity Assessment Upper Extremity Assessment: LUE deficits/detail LUE Deficits / Details: diminished/trace Lt shoulder flexion, elbow flexion grossly 50*, loose gross grasp no finger opposition LUE Sensation: decreased light touch LUE Coordination: decreased fine motor;decreased gross motor   Lower Extremity Assessment Lower Extremity Assessment: Generalized weakness   Cervical / Trunk Assessment Cervical / Trunk Assessment: (posterior pelvic tilt, rounded shoulders, slightly forward head)   Communication Communication Communication: Expressive difficulties   Cognition Arousal/Alertness: Awake/alert Behavior During Therapy: WFL for tasks assessed/performed Overall Cognitive Status: Impaired/Different from baseline Area of Impairment: Orientation;Safety/judgement;Awareness;Problem solving                 Orientation Level: Disoriented to;Situation       Safety/Judgement: Decreased awareness of deficits;Decreased awareness of safety Awareness: Anticipatory Problem Solving: Slow processing;Decreased initiation;Difficulty sequencing     General Comments  pt reports fatigue & appears to have significant physical exertion after stand pivot to recliner with RW but HR = 84bpm & decreases to 73 bpm            Home Living Family/patient expects to be discharged to:: Private residence Living Arrangements: Alone Available Help at Discharge: Family(wife checks in on him intermittently, pt reports he has other family in the area) Type of Home: House Home Access: Stairs to enter Technical brewer of Steps: 4 Entrance Stairs-Rails: Left;Right;Can reach both Home  Layout: One level               Home Equipment: Cane - single point   Additional Comments: pt has been using [place] for years      Prior Functioning/Environment Level of Independence: Independent with assistive device(s)                 OT Problem List: Decreased strength;Decreased range of motion;Decreased activity tolerance;Impaired balance (sitting and/or standing);Impaired vision/perception;Decreased coordination;Decreased safety awareness;Decreased knowledge of use of DME or AE;Decreased cognition;Decreased knowledge of precautions;Cardiopulmonary status limiting activity;Impaired sensation;Impaired UE functional use      OT Treatment/Interventions: Self-care/ADL training;Neuromuscular education;DME and/or AE instruction;Energy conservation;Therapeutic activities;Cognitive remediation/compensation;Visual/perceptual remediation/compensation;Patient/family education;Balance training    OT Goals(Current goals can be found in the care plan section) Acute Rehab OT Goals Patient Stated Goal: none stated OT Goal Formulation: With patient Time For Goal Achievement: 01/16/19 Potential to Achieve Goals: Good  OT Frequency: Min 2X/week   Barriers to D/C: Decreased caregiver support;Inaccessible home environment             AM-PAC OT "6 Clicks" Daily Activity     Outcome Measure Help from another person eating meals?: A Little Help from another person taking care of personal grooming?: A Little Help from another person toileting, which includes using toliet, bedpan, or urinal?: A Lot Help from another person bathing (including washing, rinsing, drying)?: A Lot Help from another person to put on and taking off regular upper body clothing?: A Little Help from another person to put on and taking off regular lower body clothing?: A Lot 6 Click Score: 15   End of Session Equipment Utilized During Treatment: Gait belt;Rolling walker  Nurse Communication: Mobility status  Activity  Tolerance: Patient tolerated treatment well Patient left: in chair;with call bell/phone within reach;with chair alarm set  OT Visit Diagnosis: Unsteadiness on feet (R26.81);Muscle weakness (generalized) (M62.81);History of falling (Z91.81);Hemiplegia and hemiparesis Hemiplegia - Right/Left: Left Hemiplegia - dominant/non-dominant: Non-Dominant Hemiplegia - caused by: Cerebral infarction                Time: [id number] OT Time Calculation (min): 24 min Charges:  OT General Charges $OT Visit: 1 Visit OT Evaluation $OT Eval Moderate Complexity: 1 Mod OT Treatments $Self Care/Home Management : 8-22 mins   [place], [place] 01/02/2019, 1:25 PM

## 2019-01-02 NOTE — Progress Notes (Signed)
Subjective:   Stephen [name] was examined and evaluated at bedside this AM. He was AAOx3 to name, date and place. He mentions that he feels better this morning compared to yesterday. Still continuing to endorse left sided weakness and also endorsing pain of his left lower extremity. Denies any chest pain, palpitations, shortness of breath. Discussed findings of new acute stroke. Stephen [name] expressed understanding.  Objective:  Vital signs in last 24 hours: Vitals:   01/01/19 2200 01/02/19 0000 01/02/19 0243 01/02/19 0332  BP: 133/83 135/70 122/63 130/79  Pulse: 70 83 72 66  Resp: 18 18 18 16   Temp: 97.7 F (36.5 C) 97.7 F (36.5 C) 97.8 F (36.6 C) 98.2 F (36.8 C)  TempSrc: Oral Axillary Oral   SpO2: 100% 100% 100% 100%  Weight:      Height:        Physical Exam: Physical Exam  Constitutional: He is oriented to person, place, and time and well-developed, well-nourished, and in no distress.  HENT:  Head: Normocephalic and atraumatic.  Eyes: EOM are normal.  Neck: Normal range of motion.  Cardiovascular: Normal rate, regular rhythm, normal heart sounds and intact distal pulses. Exam reveals no gallop and no friction rub.  No murmur heard. Pulmonary/Chest: Effort normal and breath sounds normal. No respiratory distress. He exhibits no tenderness.  Abdominal: Soft. Bowel sounds are normal. He exhibits no distension. There is no abdominal tenderness.  Musculoskeletal: Normal range of motion.        General: No tenderness or edema.  Neurological: Diffuse left sided weakness, dysarthria. Left sided facial droop,  Skin: Skin is warm and dry.    Pertinent labs/Imaging: CBC Latest Ref Rng & Units 01/02/2019 01/01/2019 06/29/2018  WBC 4.0 - 10.5 K/uL 7.9 8.6 7.9  Hemoglobin 13.0 - 17.0 g/dL 13.5 14.5 14.8  Hematocrit 39.0 - 52.0 % 39.6 43.0 44.4  Platelets 150 - 400 K/uL 215 251 244    CMP Latest Ref Rng & Units 01/02/2019 01/01/2019 06/29/2018  Glucose 70 - 99 mg/dL 115(H) 133(H) 114(H)   BUN 8 - 23 mg/dL 16 16 13   Creatinine 0.61 - 1.24 mg/dL 1.04 1.14 1.07  Sodium 135 - 145 mmol/L 136 135 137  Potassium 3.5 - 5.1 mmol/L 4.1 2.8(L) 3.1(L)  Chloride 98 - 111 mmol/L 103 98 99  CO2 22 - 32 mmol/L 23 26 22   Calcium 8.9 - 10.3 mg/dL 9.1 9.7 10.1  Total Protein 6.5 - 8.1 g/dL 6.8 7.9 8.4(H)  Total Bilirubin 0.3 - 1.2 mg/dL 0.7 0.4 0.8  Alkaline Phos 38 - 126 U/L 74 83 61  AST 15 - 41 U/L 23 22 18   ALT 0 - 44 U/L 14 17 12    MRI/A head: 1. Acute lacunar infarct of the right posterior corona radiata and lentiform. No associated hemorrhage or mass effect. 2. Underlying advanced chronic small vessel disease. 3. 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 Some retrograde reconstitution of the right V4 segment again noted. 4. Small chronic right posterior frontal convexity meningioma unchanged since 2014.   Assessment/Plan:  Active Problems:   CVA (cerebral vascular accident) [facility])    Patient Summary: Stephen [name] is a 68 y.o. male with a pertinent PMH of PAD, CVA, stable right frontal meningioma, GERD, HLD, who presented with acute onset left sided weakness concerning for CVA.   #CVA  - Consult Neurology today. Appreciate neurology recommendation. 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   Marianna Payment, MD 01/02/2019, 5:54 AM Pager: [phone]

## 2019-01-02 NOTE — Progress Notes (Signed)
CSW acknowledges consult for SNF. Patient has pending PT/OT evaluations pending. CSW will continue to follow and base disposition off therapy recommendations.   Domenic Schwab, MSW, [place] Worker [facility]  [phone]

## 2019-01-03 LAB — CBC
HCT: 38.6 % — ABNORMAL LOW (ref 39.0–52.0)
Hemoglobin: 13.2 g/dL (ref 13.0–17.0)
MCH: 28.5 pg (ref 26.0–34.0)
MCHC: 34.2 g/dL (ref 30.0–36.0)
MCV: 83.4 fL (ref 80.0–100.0)
Platelets: 221 10*3/uL (ref 150–400)
RBC: 4.63 MIL/uL (ref 4.22–5.81)
RDW: 16.1 % — ABNORMAL HIGH (ref 11.5–15.5)
WBC: 9.1 10*3/uL (ref 4.0–10.5)
nRBC: 0 % (ref 0.0–0.2)

## 2019-01-03 LAB — GLUCOSE, CAPILLARY
Glucose-Capillary: 113 mg/dL — ABNORMAL HIGH (ref 70–99)
Glucose-Capillary: 106 mg/dL — ABNORMAL HIGH (ref 70–99)
Glucose-Capillary: 92 mg/dL (ref 70–99)
Glucose-Capillary: 181 mg/dL — ABNORMAL HIGH (ref 70–99)

## 2019-01-03 MED ORDER — GABAPENTIN 400 MG PO CAPS
500.0000 mg | ORAL_CAPSULE | Freq: Three times a day (TID) | ORAL | Status: DC
  Administered 2019-01-03 – 2019-01-05 (×9): 500 mg via ORAL
  Filled 2019-01-03 (×9): qty 1

## 2019-01-03 NOTE — Progress Notes (Signed)
Subjective:   Patient seen this morning on rounds. He states that he is have some pain in his left arm that is making it difficult for him to rest. He is currently taking Duloxetine 30 mg BID and Gabapentin 400 mg TID.  Objective:  Vital signs in last 24 hours: Vitals:   01/02/19 2018 01/03/19 0019 01/03/19 0404 01/03/19 0831  BP: 139/89 125/83 (!) 111/97 139/83  Pulse: 83 71 77 72  Resp: 16 18 20    Temp: (!) 97.5 F (36.4 C) 98 F (36.7 C) 98.6 F (37 C) 97.9 F (36.6 C)  TempSrc: Oral Oral Oral Oral  SpO2: 99% 100% 97% 100%  Weight:      Height:        Physical Exam: Physical Exam  Constitutional: He is oriented to person, place, and time and well-developed, well-nourished, and in no distress.  HENT:  Head: Normocephalic and atraumatic.  Eyes: EOM are normal.  Neck: Normal range of motion.  Cardiovascular: Normal rate, regular rhythm, normal heart sounds and intact distal pulses. Exam reveals no gallop and no friction rub.  No murmur heard. Pulmonary/Chest: Effort normal and breath sounds normal. No respiratory distress. He exhibits no tenderness.  Abdominal: Soft. Bowel sounds are normal. He exhibits no distension. There is no abdominal tenderness.  Musculoskeletal:     Comments: Patient has decreased range of motion of his left with 2/5 strength with pain.   Neurological: He is alert and oriented to person, place, and time. He is not disoriented.  Diffuse left sided weakness, with dysarthria  Skin: Skin is warm.       Pertinent labs/Imaging: CBC Latest Ref Rng & Units 01/03/2019 01/02/2019 01/01/2019  WBC 4.0 - 10.5 K/uL 9.1 7.9 8.6  Hemoglobin 13.0 - 17.0 g/dL 13.2 13.5 14.5  Hematocrit 39.0 - 52.0 % 38.6(L) 39.6 43.0  Platelets 150 - 400 K/uL 221 215 251    CMP Latest Ref Rng & Units 01/02/2019 01/01/2019 06/29/2018  Glucose 70 - 99 mg/dL 115(H) 133(H) 114(H)  BUN 8 - 23 mg/dL 16 16 13   Creatinine 0.61 - 1.24 mg/dL 1.04 1.14 1.07  Sodium 135 - 145 mmol/L  136 135 137  Potassium 3.5 - 5.1 mmol/L 4.1 2.8(L) 3.1(L)  Chloride 98 - 111 mmol/L 103 98 99  CO2 22 - 32 mmol/L 23 26 22   Calcium 8.9 - 10.3 mg/dL 9.1 9.7 10.1  Total Protein 6.5 - 8.1 g/dL 6.8 7.9 8.4(H)  Total Bilirubin 0.3 - 1.2 mg/dL 0.7 0.4 0.8  Alkaline Phos 38 - 126 U/L 74 83 61  AST 15 - 41 U/L 23 22 18   ALT 0 - 44 U/L 14 17 12    MRA of head: 1. Significantly motion degraded exam. 2. Intracranial atherosclerotic disease with multifocal stenoses, most notably as follows. 3. Moderate segmental narrowing of the proximal M2 left MCA superior division. 4. Redemonstrated moderate/severe stenosis within the proximal A2 left ACA. 5. 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 Some flow related signal is demonstrated within the very distal intracranial right vertebral artery. The intracranial left vertebral artery is patent without stenosis and supplies the basilar artery. 6. Mild/moderate focal stenosis within the P1 right PCA.  MRA of neck: 1. Acute lacunar infarct of the right posterior corona radiata and lentiform. No associated hemorrhage or mass effect. 2. Underlying advanced chronic small vessel disease. 3. 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 Some retrograde reconstitution of the right V4 segment again noted. 4. Small chronic right posterior frontal convexity meningioma unchanged since 2014.  Echo:  1. Left ventricular ejection fraction, by visual estimation, is 60 to 65%. The left ventricle has normal function. There is no left ventricular hypertrophy.  2. Left ventricular diastolic parameters are consistent with Grade I diastolic dysfunction (impaired relaxation).  3. Global right ventricle has normal systolic function.The right ventricular size is normal. No increase in right ventricular wall thickness.  4. Left  atrial size was normal.  5. Right atrial size was normal.  6. The mitral valve is grossly normal. No evidence of mitral valve regurgitation.  7. The tricuspid valve is normal in structure. Tricuspid valve regurgitation is trivial.  8. The aortic valve is normal in structure. Aortic valve regurgitation is not visualized.  9. The pulmonic valve was grossly normal. Pulmonic valve regurgitation is not visualized. 10. The atrial septum is grossly normal.  Assessment/Plan:  Active Problems:   CVA (cerebral vascular accident) [facility])   Patient Summary: Mr. Tenerelli is a 68 y.o. male with a pertinent PMH of PAD, CVA, stable right frontal meningioma, GERD, HLD, who presented with acute onset left sided weakness concerning for CVA.   #CVA  - Appreciate neurology recommendation. - Permissive HTN (Systolic < XX123456, Diastolic < 123456) - Continue Clopidogrel 75 mg, start pletal 50 mg for 3 weeks then plavix alone - Atorvastatin 80 mg daily  - Echo - EF of 123456, Grade I diastolic dysfunction  - Continue PT/OT  - Will need SNF placement  - increase Gabapentin to 500 TID for pain.  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 DAPT for 3 weeks, then plavix alone   #HTN: - Continue permissive HTN (<220 SBP, < 110 DBP). Normalize in roughly 1 week.   #Prediabetes: - A1C 6.3  Diet: heart healthy IVF: none VTE: Enoxaparin Code: DNI  Dispo: Anticipated discharge pending clinical improvement.   Diet: Heart Healthy IVF: none VTE: Enoxaparin  Code: DNI  Dispo: Anticipated discharge pending SNF placement.   Marianna Payment, MD 01/03/2019, 8:51 AM Pager: [phone]

## 2019-01-03 NOTE — TOC Progression Note (Signed)
Transition of Care [facility]) - Progression Note    Patient Details  Name: Stephen [name] MRN: [id number] Date of Birth: January 20, 1951  Transition of Care [facility]) CM/SW [place], [place] Phone Number: 01/03/2019, 12:54 PM  Clinical Narrative:     CSW called patient's wife and left a voicemail asking for a return phone call to discuss SNF options. CSW started insurance authorization with [facility] and CSW faxed clinicals.   CSW will continue to follow and assist with disposition planning.   Expected Discharge Plan: [place] Barriers to Discharge: Continued Medical Work up  Expected Discharge Plan and Services Expected Discharge Plan: [place] In-house Referral: Clinical Social Work Discharge Planning Services: CM Consult Post Acute Care Choice: [place] arrangements for the past 2 months: Single Family Home                                       Social Determinants of Health (SDOH) Interventions    Readmission Risk Interventions No flowsheet data found.

## 2019-01-04 DIAGNOSIS — I639 Cerebral infarction, unspecified: Secondary | ICD-10-CM

## 2019-01-04 LAB — CBC
HCT: 36.6 % — ABNORMAL LOW (ref 39.0–52.0)
Hemoglobin: 12.8 g/dL — ABNORMAL LOW (ref 13.0–17.0)
MCH: 28.9 pg (ref 26.0–34.0)
MCHC: 35 g/dL (ref 30.0–36.0)
MCV: 82.6 fL (ref 80.0–100.0)
Platelets: 221 10*3/uL (ref 150–400)
RBC: 4.43 MIL/uL (ref 4.22–5.81)
RDW: 15.9 % — ABNORMAL HIGH (ref 11.5–15.5)
WBC: 9.2 10*3/uL (ref 4.0–10.5)
nRBC: 0 % (ref 0.0–0.2)

## 2019-01-04 LAB — GLUCOSE, CAPILLARY
Glucose-Capillary: 95 mg/dL (ref 70–99)
Glucose-Capillary: 125 mg/dL — ABNORMAL HIGH (ref 70–99)
Glucose-Capillary: 111 mg/dL — ABNORMAL HIGH (ref 70–99)

## 2019-01-04 LAB — BASIC METABOLIC PANEL
Anion gap: 8 (ref 5–15)
BUN: 13 mg/dL (ref 8–23)
CO2: 22 mmol/L (ref 22–32)
Calcium: 9 mg/dL (ref 8.9–10.3)
Chloride: 104 mmol/L (ref 98–111)
Creatinine, Ser: 0.9 mg/dL (ref 0.61–1.24)
GFR calc Af Amer: >60 mL/min (ref >60)
GFR calc non Af Amer: >60 mL/min (ref >60)
Glucose, Bld: 100 mg/dL — ABNORMAL HIGH (ref 70–99)
Potassium: 3.9 mmol/L (ref 3.5–5.1)
Sodium: 134 mmol/L — ABNORMAL LOW (ref 135–145)

## 2019-01-04 LAB — URINALYSIS, ROUTINE W REFLEX MICROSCOPIC
Bilirubin Urine: NEGATIVE
Glucose, UA: NEGATIVE mg/dL
Hgb urine dipstick: NEGATIVE
Ketones, ur: NEGATIVE mg/dL
Leukocytes,Ua: NEGATIVE
Nitrite: NEGATIVE
Protein, ur: NEGATIVE mg/dL
Specific Gravity, Urine: 1.026 (ref 1.005–1.030)
pH: 5 (ref 5.0–8.0)

## 2019-01-04 NOTE — TOC Progression Note (Signed)
Transition of Care [facility]) - Progression Note    Patient Details  Name: Stephen [name] MRN: [id number] Date of Birth: 08-30-1950  Transition [facility]) CM/SW [place], [place] Phone Number: 01/04/2019, 11:07 AM  Clinical Narrative:     Provided bed offers to patient's wife. She wanted a little time to chose between the three choices. CSW stated she would follow up with her later on this afternoon.   CSW will continue to follow and assist with disposition planning.   Expected Discharge Plan: [place] Barriers to Discharge: Continued Medical Work up  Expected Discharge Plan and Services Expected Discharge Plan: [place] In-house Referral: Clinical Social Work Discharge Planning Services: CM Consult Post Acute Care Choice: [place] arrangements for the past 2 months: Single Family Home                                       Social Determinants of Health (SDOH) Interventions    Readmission Risk Interventions No flowsheet data found.

## 2019-01-04 NOTE — Progress Notes (Signed)
Subjective:   pt seen a the bedsdie this AM on rounds. Pt says his L arm is moving better than when he came in. Was able to get some rest last night. Pt is concerned that his L hand is contracted. Says he is not able to move his L leg much and it's painful. Pt is alert and  oriented x3. Has not had a chance to eat breakfast yet, but appeitite is there. Denies SOB, abd pain, or urinary problems. Pt understands he needs more rehab at a nursing facility when he leaves the hospital  Objective:  Vital signs in last 24 hours: Vitals:   01/03/19 1726 01/03/19 2002 01/03/19 2313 01/04/19 0351  BP: 134/71 133/74 127/81 136/84  Pulse: 79 91 96 90  Resp:  20 16 16   Temp: 97.9 F (36.6 C) 98.6 F (37 C) 98.8 F (37.1 C) 98.5 F (36.9 C)  TempSrc: Oral Oral Oral Oral  SpO2: 100% 99% 97% 100%  Weight:      Height:        Physical Exam: Physical Exam  Constitutional: He is oriented to person, place, and time and well-developed, well-nourished, and in no distress.  HENT:  Head: Normocephalic and atraumatic.  Eyes: EOM are normal.  Neck: Normal range of motion.  Cardiovascular: Normal rate, regular rhythm, normal heart sounds and intact distal pulses. Exam reveals no gallop and no friction rub.  No murmur heard. Pulmonary/Chest: Effort normal and breath sounds normal. No respiratory distress. He exhibits no tenderness.  Abdominal: Soft. Bowel sounds are normal. He exhibits no distension. There is no abdominal tenderness.  Musculoskeletal: Normal range of motion.        General: No tenderness or edema.  Neurological: He is alert and oriented to person, place, and time.  LUE 2/5 strength, but improved from the day before. LLE 1/5 strength. Pain improved, mentation improved. Still has some dysarthria   Skin: Skin is warm and dry.     Pertinent labs/Imaging: CBC Latest Ref Rng & Units 01/04/2019 01/03/2019 01/02/2019  WBC 4.0 - 10.5 K/uL 9.2 9.1 7.9  Hemoglobin 13.0 - 17.0 g/dL 12.8(L) 13.2  13.5  Hematocrit 39.0 - 52.0 % 36.6(L) 38.6(L) 39.6  Platelets 150 - 400 K/uL 221 221 215    CMP Latest Ref Rng & Units 01/02/2019 01/01/2019 06/29/2018  Glucose 70 - 99 mg/dL 115(H) 133(H) 114(H)  BUN 8 - 23 mg/dL 16 16 13   Creatinine 0.61 - 1.24 mg/dL 1.04 1.14 1.07  Sodium 135 - 145 mmol/L 136 135 137  Potassium 3.5 - 5.1 mmol/L 4.1 2.8(L) 3.1(L)  Chloride 98 - 111 mmol/L 103 98 99  CO2 22 - 32 mmol/L 23 26 22   Calcium 8.9 - 10.3 mg/dL 9.1 9.7 10.1  Total Protein 6.5 - 8.1 g/dL 6.8 7.9 8.4(H)  Total Bilirubin 0.3 - 1.2 mg/dL 0.7 0.4 0.8  Alkaline Phos 38 - 126 U/L 74 83 61  AST 15 - 41 U/L 23 22 18   ALT 0 - 44 U/L 14 17 12     Assessment/Plan:  Active Problems:   CVA (cerebral vascular accident) [facility])   Patient Summary: Mr. Pallanes is a 68 y.o. male with a pertinent PMH of PAD, CVA, stable right frontal meningioma,GERD, HLD, who presented with acute onset left sided weakness concerning for CVA.  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 Improved today.  #CAD: - continue ASA/Plavix. DAPT for 3 weeks, then plavix alone   #HTN: - Continue permissive HTN (<220 SBP, < 110 DBP). Normalize in roughly 1 week.   #Prediabetes: - A1C 6.3  Diet: Heart health IVF: none VTE: Enoxaparin Code: DNI   Dispo: Anticipated discharge pending SNF placement.   Marianna Payment, MD 01/04/2019, 5:34 AM Pager: [phone]

## 2019-01-05 ENCOUNTER — Encounter (HOSPITAL_COMMUNITY): Payer: Self-pay | Admitting: *Deleted

## 2019-01-05 ENCOUNTER — Inpatient Hospital Stay (HOSPITAL_COMMUNITY): Payer: Medicare Other

## 2019-01-05 DIAGNOSIS — R488 Other symbolic dysfunctions: Secondary | ICD-10-CM | POA: Diagnosis not present

## 2019-01-05 DIAGNOSIS — M6281 Muscle weakness (generalized): Secondary | ICD-10-CM | POA: Diagnosis not present

## 2019-01-05 DIAGNOSIS — Z7401 Bed confinement status: Secondary | ICD-10-CM | POA: Diagnosis not present

## 2019-01-05 DIAGNOSIS — G9340 Encephalopathy, unspecified: Secondary | ICD-10-CM | POA: Diagnosis not present

## 2019-01-05 DIAGNOSIS — Z79899 Other long term (current) drug therapy: Secondary | ICD-10-CM | POA: Diagnosis not present

## 2019-01-05 DIAGNOSIS — R1311 Dysphagia, oral phase: Secondary | ICD-10-CM | POA: Diagnosis not present

## 2019-01-05 DIAGNOSIS — I639 Cerebral infarction, unspecified: Secondary | ICD-10-CM | POA: Diagnosis not present

## 2019-01-05 DIAGNOSIS — I739 Peripheral vascular disease, unspecified: Secondary | ICD-10-CM | POA: Diagnosis not present

## 2019-01-05 DIAGNOSIS — R7303 Prediabetes: Secondary | ICD-10-CM | POA: Diagnosis not present

## 2019-01-05 DIAGNOSIS — M62471 Contracture of muscle, right ankle and foot: Secondary | ICD-10-CM | POA: Diagnosis not present

## 2019-01-05 DIAGNOSIS — Z23 Encounter for immunization: Secondary | ICD-10-CM | POA: Diagnosis not present

## 2019-01-05 DIAGNOSIS — M25562 Pain in left knee: Secondary | ICD-10-CM | POA: Diagnosis not present

## 2019-01-05 DIAGNOSIS — Z741 Need for assistance with personal care: Secondary | ICD-10-CM | POA: Diagnosis not present

## 2019-01-05 DIAGNOSIS — I679 Cerebrovascular disease, unspecified: Secondary | ICD-10-CM | POA: Diagnosis not present

## 2019-01-05 DIAGNOSIS — G8194 Hemiplegia, unspecified affecting left nondominant side: Secondary | ICD-10-CM | POA: Diagnosis not present

## 2019-01-05 DIAGNOSIS — I10 Essential (primary) hypertension: Secondary | ICD-10-CM | POA: Diagnosis not present

## 2019-01-05 DIAGNOSIS — E785 Hyperlipidemia, unspecified: Secondary | ICD-10-CM | POA: Diagnosis not present

## 2019-01-05 DIAGNOSIS — S8992XA Unspecified injury of left lower leg, initial encounter: Secondary | ICD-10-CM | POA: Diagnosis not present

## 2019-01-05 DIAGNOSIS — M2550 Pain in unspecified joint: Secondary | ICD-10-CM | POA: Diagnosis not present

## 2019-01-05 DIAGNOSIS — R5381 Other malaise: Secondary | ICD-10-CM | POA: Diagnosis not present

## 2019-01-05 DIAGNOSIS — K219 Gastro-esophageal reflux disease without esophagitis: Secondary | ICD-10-CM | POA: Diagnosis not present

## 2019-01-05 DIAGNOSIS — I6501 Occlusion and stenosis of right vertebral artery: Secondary | ICD-10-CM | POA: Diagnosis not present

## 2019-01-05 DIAGNOSIS — D329 Benign neoplasm of meninges, unspecified: Secondary | ICD-10-CM | POA: Diagnosis not present

## 2019-01-05 DIAGNOSIS — I2510 Atherosclerotic heart disease of native coronary artery without angina pectoris: Secondary | ICD-10-CM | POA: Diagnosis not present

## 2019-01-05 LAB — GLUCOSE, CAPILLARY
Glucose-Capillary: 111 mg/dL — ABNORMAL HIGH (ref 70–99)
Glucose-Capillary: 142 mg/dL — ABNORMAL HIGH (ref 70–99)
Glucose-Capillary: 117 mg/dL — ABNORMAL HIGH (ref 70–99)
Glucose-Capillary: 123 mg/dL — ABNORMAL HIGH (ref 70–99)

## 2019-01-05 LAB — BASIC METABOLIC PANEL
Anion gap: 11 (ref 5–15)
BUN: 14 mg/dL (ref 8–23)
CO2: 23 mmol/L (ref 22–32)
Calcium: 9 mg/dL (ref 8.9–10.3)
Chloride: 101 mmol/L (ref 98–111)
Creatinine, Ser: 0.95 mg/dL (ref 0.61–1.24)
GFR calc Af Amer: >60 mL/min (ref >60)
GFR calc non Af Amer: >60 mL/min (ref >60)
Glucose, Bld: 98 mg/dL (ref 70–99)
Potassium: 4.1 mmol/L (ref 3.5–5.1)
Sodium: 135 mmol/L (ref 135–145)

## 2019-01-05 LAB — SARS CORONAVIRUS 2 (TAT 6-24 HRS): SARS Coronavirus 2: NEGATIVE

## 2019-01-05 MED ORDER — CILOSTAZOL 50 MG PO TABS: 50.0000 mg | ORAL_TABLET | Freq: Two times a day (BID) | ORAL | 0 refills | Status: AC

## 2019-01-05 MED ORDER — FENTANYL CITRATE (PF) 100 MCG/2ML IJ SOLN
INTRAMUSCULAR | Status: AC
  Administered 2019-01-05: 12:00:00
  Filled 2019-01-05: qty 2

## 2019-01-05 MED ORDER — TIZANIDINE HCL 4 MG PO TABS: 4.0000 mg | ORAL_TABLET | Freq: Four times a day (QID) | ORAL | 2 refills | Status: AC | PRN

## 2019-01-05 MED ORDER — DICLOFENAC SODIUM 1 % EX GEL
2.0000 g | Freq: Three times a day (TID) | CUTANEOUS | Status: DC | PRN
  Administered 2019-01-05: 2 g via TOPICAL
  Filled 2019-01-05: qty 100

## 2019-01-05 NOTE — Progress Notes (Signed)
Pt c/o of pain ad more weakness to  LUA & LLE when elevating them , more tremor to RLE when elevating it as well. Tylenol  given but did not help. MD on call notified about pain and increased weakness to the left side..  RN will continue to monitor.

## 2019-01-05 NOTE — Progress Notes (Signed)
Paged by nurse stating that the patient has increased weakness on right side along with pain in his left lower extremity.  Went to bedside to evaluate. Neuro exam: CN2-12 intact, sensation intact in upper and lower extremities, 0/5 strength in lle, 2/5 strength in lue, 4/5 strength in [place], 3/5 strength in rle along with constant spastic plantar flexion movements present in right ankle that temporarily resolve with forced dorsiflexion. Tenderness present markedly over left knee (that is worsened with palpation) and to a lesser extent further down the left lower extremity.   Patient states that acetaminophen relieves the pain in lower extremity somewhat.   Assessment and plan  Patient appears to have slightly worsening right sided weakness in comparison to physical exam documentation by primary team. The patients worsening weakness maybe as a result of deconditioning and pain.   Will get imaging of left knee due to evaluate for any effusion, structural changes.   Patient's repeated muscular contractions at right ankle appear consistent with clonus which maybe a result from recent stroke. Patient not on any serotonergic agents. Uncertain if has any epileptiform activity. Will need to evaluate for spinal cord damage.   -left knee xray   -voltaren gel over left knee  -apply heat to left knee  -Neurology follow up to determine cause of clonus like activity  Lars Mage, MD Internal Medicine PGY3 Pager:[phone] 01/05/2019, 6:06 AM

## 2019-01-05 NOTE — Discharge Summary (Signed)
Name: Declen Homrich MRN: [id number] DOB: 1950-07-20 68 y.o. PCP: Mitzi Hansen, MD  Date of Admission: 01/01/2019  1:24 PM Date of Discharge:  Attending Physician: Bartholomew Crews, MD  Discharge Diagnosis: 1. CVA  Discharge Medications: Allergies as of 01/05/2019      Reactions   Aspirin Other (See Comments)   Stomach pain/ dizziness   Lisinopril Other (See Comments)   Acute kidney insufficiency in 2013 (Cr elevated from 1.00 to 1.79      Medication List    STOP taking these medications   amLODipine 10 MG tablet Commonly known as: NORVASC   hydrochlorothiazide 25 MG tablet Commonly known as: HYDRODIURIL     TAKE these medications   atorvastatin 80 MG tablet Commonly known as: LIPITOR TAKE ONE (1) TABLET BY MOUTH EVERY DAY AT SIX IN THE EVENING What changed:   how much to take  how to take this  when to take this   cilostazol 50 MG tablet Commonly known as: PLETAL Take 1 tablet (50 mg total) by mouth 2 (two) times daily for 17 days. 50 mg tablet by mouth twice daily for 17 days. What changed:   medication strength  how much to take  how to take this  when to take this  additional instructions   clopidogrel 75 MG tablet Commonly known as: PLAVIX TAKE ONE (1) TABLET BY MOUTH EVERY DAY   DULoxetine 30 MG capsule Commonly known as: Cymbalta Take 1 capsule (30 mg total) by mouth daily.   gabapentin 400 MG capsule Commonly known as: NEURONTIN Take 1 capsule (400 mg total) by mouth 3 (three) times daily.   Lidocaine 4 % Ptch Apply 1 patch topically daily as needed (pain).   pantoprazole 20 MG tablet Commonly known as: PROTONIX TAKE ONE (1) TABLET BY MOUTH EVERY DAY What changed:   how much to take  how to take this  when to take this   tiZANidine 4 MG tablet Commonly known as: Zanaflex Take 1 tablet (4 mg total) by mouth every 6 (six) hours as needed for up to 14 days for muscle spasms.       Disposition and follow-up:     Mr.[name] Schulenburg was discharged from [facility] in Stable condition.  At the hospital follow up visit please address:  1. Follow up:  A) CVA - follow up with neurology in 4 weeks, make sure patient has appointment set up.  B) HTN - patient discharged off of HTN meds. Will need to be re-evaluated in 1 to 2 weeks to restart his HTN medications.  C) DAPT - make sure patient continue DAPT for 3 weeks, then Plavix alone  D) Prediabetes - likely need metformin.   2.  Labs / imaging needed at time of follow-up: cbc, bmp  3.  Pending labs/ test needing follow-up: none  Follow-up Appointments: Follow-up Information    [facility]. Schedule an appointment as soon as possible for a visit in 4 week(s).   Specialty: Neurology Contact information: [address] [place] [phone]       Mitzi Hansen, MD. Schedule an appointment as soon as possible for a visit in 2 week(s).   Specialty: Internal Medicine Why: [facility] will call the patient to make an appointment.  Contact information: [address] [place] [address] [place] [facility] Course by problem list: Mr. Sokolik is a 68 year old  male with a pertinent past medical history of PAD, CVA, stable right frontal meningioma, GERD, hyperlipidemia, who presented to [facility] with acute onset left-sided weakness with dysarthria and facial asymmetry secondary to acute CVA.  1.  CVA -patient presented to [facility] ED on 12/3 with sudden onset dysarthria, diffuse left-sided weakness, facial asymmetry, and difficulty concentrating and remembering symptoms.  Neurology was consulted and MRA of head and neck showed intracranial atherosclerosis with multifocal stenosis, moderate segmental narrowing of proximal M2 left MCA superior division, and mild and moderate focal stenosis within the P1 right PCA.  MRA of the neck shows acute lacunar  infarct of the right posterior corona radiata and lentiform without associated hemorrhage echocardiogram showed ejection fraction of 60-65% with grade 1 diastolic dysfunction. Patient was started on dual antiplatelet therapy with clopidogrel and cilostazol and atorvastatin 80 mg.  Permissive hypertension was permitted for the first week.  He will be discharged without his hypertensive medications due to soft blood pressures.  Over the course of the hospitalization the patient has had minimal improvement of his left-sided weakness.  Patient will be discharged to SNF for further PT.   2. Prediabetes -patient's hemoglobin A1c came back at 6.3.  Never diagnosed with diabetes prior. Will likely need metformin for prediabetes therapy.  Discharge Vitals:   BP 128/79 (BP Location: Right Arm)    Pulse 85    Temp 99 F (37.2 C) (Oral)    Resp 20    Ht 5\' 6"  (1.676 m)    Wt 66.2 kg    SpO2 98%    BMI 23.56 kg/m   Pertinent Labs, Studies, and Procedures:  CBC Latest Ref Rng & Units 01/04/2019 01/03/2019 01/02/2019  WBC 4.0 - 10.5 K/uL 9.2 9.1 7.9  Hemoglobin 13.0 - 17.0 g/dL 12.8(L) 13.2 13.5  Hematocrit 39.0 - 52.0 % 36.6(L) 38.6(L) 39.6  Platelets 150 - 400 K/uL 221 221 215   CMP Latest Ref Rng & Units 01/05/2019 01/04/2019 01/02/2019  Glucose 70 - 99 mg/dL 98 100(H) 115(H)  BUN 8 - 23 mg/dL 14 13 16   Creatinine 0.61 - 1.24 mg/dL 0.95 0.90 1.04  Sodium 135 - 145 mmol/L 135 134(L) 136  Potassium 3.5 - 5.1 mmol/L 4.1 3.9 4.1  Chloride 98 - 111 mmol/L 101 104 103  CO2 22 - 32 mmol/L 23 22 23   Calcium 8.9 - 10.3 mg/dL 9.0 9.0 9.1  Total Protein 6.5 - 8.1 g/dL - - 6.8  Total Bilirubin 0.3 - 1.2 mg/dL - - 0.7  Alkaline Phos 38 - 126 U/L - - 74  AST 15 - 41 U/L - - 23  ALT 0 - 44 U/L - - 14   Lipid Panel     Component Value Date/Time   CHOL 121 01/02/2019 0444   TRIG 65 01/02/2019 0444   HDL 29 (L) 01/02/2019 0444   CHOLHDL 4.2 01/02/2019 0444   VLDL 13 01/02/2019 0444   LDLCALC 79 01/02/2019 0444     MRA Head/Neck: MRA of head: 1. Significantly motion degraded exam. 2. Intracranial atherosclerotic disease with multifocal stenoses, most notably as follows. 3. Moderate segmental narrowing of the proximal M2 left MCA superior division. 4. Redemonstrated moderate/severe stenosis within the proximal A2 left ACA. 5. 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 Some flow related signal is demonstrated within the very distal intracranial right vertebral artery. The intracranial left vertebral artery is patent without stenosis and supplies the  basilar artery. 6. Mild/moderate focal stenosis within the P1 right PCA.  MRA of neck: 1. Acute lacunar infarct of the right posterior corona radiata and lentiform. No associated hemorrhage or mass effect. 2. Underlying advanced chronic small vessel disease. 3. 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 Some retrograde reconstitution of the right V4 segment again noted. 4. Small chronic right posterior frontal convexity meningioma unchanged since 2014.  Echo: 1. Left ventricular ejection fraction, by visual estimation, is 60 to 65%. The left ventricle has normal function. There is no left ventricular hypertrophy. 2. Left ventricular diastolic parameters are consistent with Grade I diastolic dysfunction (impaired relaxation). 3. Global right ventricle has normal systolic function.The right ventricular size is normal. No increase in right ventricular wall thickness. 4. Left atrial size was normal. 5. Right atrial size was normal. 6. The mitral valve is grossly normal. No evidence of mitral valve regurgitation. 7. The tricuspid valve is normal in structure. Tricuspid valve regurgitation is trivial. 8. The aortic valve is normal in structure. Aortic valve regurgitation is not visualized. 9. The  pulmonic valve was grossly normal. Pulmonic valve regurgitation is not visualized. 10. The atrial septum is grossly normal  Discharge Instructions: Discharge Instructions    Ambulatory referral to Neurology   Complete by: As directed    Follow up with stroke clinic NP (Jessica Vanschaick or Cecille Rubin, if both not available, consider Zachery Dauer, or [place]) at [facility] in about 4 weeks. Thanks.   Diet - low sodium heart healthy   Complete by: As directed    Diet - low sodium heart healthy   Complete by: As directed    Discharge instructions   Complete by: As directed    You were hospitalized for Stroke. Thank you for allowing [place] to be part of your care.   We arranged for you to follow up at:   1) [facility] in 4 weeks 2) [facility] in 1-2 weeks    Please note these changes made to your medications:   Please START taking:   1) Cilastazol 50 twice daily for 17 days 2) Tizanadine 4 mg every 6 hours as needed for muscle pain.   Please STOP taking:  1) Amlodipine 10 mg 2) Hydrochlorothiazide 25 mg    Please make sure to follow up with your PCP  Please call our clinic if you have any questions or concerns, we may be able to help and keep you from a long and expensive emergency room wait. Our clinic and after hours phone number is [phone], the best time to call is Monday through Friday 9 am to 4 pm but there is always someone available 24/7 if you have an emergency. If you need medication refills please notify your pharmacy one week in advance and they will send [place] a request.   Increase activity slowly   Complete by: As directed    Increase activity slowly   Complete by: As directed       Signed: Marianna Payment, MD 01/05/2019, 12:30 PM   Pager: [phone]

## 2019-01-05 NOTE — Plan of Care (Signed)
  Problem: Clinical Measurements: Goal: Cardiovascular complication will be avoided Outcome: Progressing   Problem: Clinical Measurements: Goal: Respiratory complications will improve Outcome: Progressing   Problem: Clinical Measurements: Goal: Diagnostic test results will improve Outcome: Not Progressing   Problem: Clinical Measurements: Goal: Will remain free from infection Outcome: Progressing

## 2019-01-05 NOTE — Progress Notes (Signed)
   MD came to reevaluate pt , New orders written and carried out.  waheat pack applied to left knee some reliefnoted but pt still rate pain at 6/10 . Awaiting Voltaren cream from pharmacy to applied.

## 2019-01-05 NOTE — Care Management Important Message (Signed)
Important Message  Patient Details  Name: Stephen [name] MRN: [id number] Date of Birth: Oct 18, 1950   Medicare Important Message Given:  Yes     [name] Montine Circle 01/05/2019, 4:49 PM

## 2019-01-05 NOTE — TOC Progression Note (Signed)
Transition of Care [facility]) - Progression Note    Patient Details  Name: Stephen [name] MRN: [id number] Date of Birth: 1950-02-17  Transition of Care [facility]) CM/SW [place], [place] Phone Number: 01/05/2019, 8:53 AM  Clinical Narrative:     CSW called and spoke with patient's wife. She chose [facility] for his SNF placement.   CSW called and confirmed with Bryson Ha that they are able to take him. They are requesting patient have an updated COVID test before discharge.   CSW has paged MD to relay this information. CSW is awaiting a return phone call.   Expected Discharge Plan: [place] Barriers to Discharge: Continued Medical Work up  Expected Discharge Plan and Services Expected Discharge Plan: [place] In-house Referral: Clinical Social Work Discharge Planning Services: CM Consult Post Acute Care Choice: [place] arrangements for the past 2 months: Single Family Home                                       Social Determinants of Health (SDOH) Interventions    Readmission Risk Interventions No flowsheet data found.

## 2019-01-05 NOTE — TOC Transition Note (Signed)
Transition of Care [facility] - CM/SW Discharge Note   Patient Details  Name: Stephen [name] MRN: [id number] Date of Birth: 07/28/1950  Transition of Care [facility]) CM/SW Contact:  Gelene Mink, [place] Phone Number: 01/05/2019, 3:38 PM   Clinical Narrative:     Patient will DC to: [place] Anticipated DC date: 01/05/2019 Family notified: Yes Transport by: Corey Harold   Per MD patient ready for DC to . RN, patient, patient's family, and facility notified of DC. Discharge Summary and FL2 sent to facility. RN to call report prior to discharge ([phone]). Patient will report to room 124.  DC packet on chart. Ambulance transport requested for patient.   CSW will sign off for now as social work intervention is no longer needed. Please consult [place] again if new needs arise.  Yonathan Perrow, LCSW-A [place]/Clinical Social Work Department Cell: [phone]    Final next level of care: [place] Barriers to Discharge: Continued Medical Work up   Patient Goals and CMS Choice Patient states their goals for this hospitalization and ongoing recovery are:: Pt will discharge to SNF CMS Medicare.gov Compare Post Acute Care list provided to:: Patient Represenative (must comment) Choice offered to / list presented to : Spouse  Discharge Placement   Existing PASRR number confirmed : 01/05/19          Patient chooses bed at: Other - please specify in the comment section below:([facility]) Patient to be transferred to facility by: [place] Name of family member notified: [name] Patient and family notified of of transfer: 01/05/19  Discharge Plan and Services In-house Referral: Clinical Social Work Discharge Planning Services: CM Consult Post Acute Care Choice: [place]          DME Arranged: N/A DME Agency: NA       HH Arranged: NA HH Agency: NA        Social Determinants of Health (SDOH) Interventions     Readmission Risk Interventions No  flowsheet data found.

## 2019-01-05 NOTE — Progress Notes (Signed)
Subjective:   Mr.Vanalstyne was examined and evaluated at bedside this AM. He was observed resting comfortably in bed. He is complaining of left knee pain. His left knee was noted to be in flexion but states once it was extended out, he had improvement in pain. He is also able to demonstrate movement of his left upper extremity.  Objective:  Vital signs in last 24 hours: Vitals:   01/04/19 2345 01/05/19 0339 01/05/19 0816 01/05/19 1155  BP: 128/81 131/71 137/72 128/79  Pulse: 100 86 87 85  Resp: 18 20 20 20   Temp: 98.6 F (37 C) 98.4 F (36.9 C) 98.4 F (36.9 C) 99 F (37.2 C)  TempSrc: Oral Oral Oral Oral  SpO2: 98% 100% 99% 98%  Weight:      Height:        Physical Exam: Physical Exam  Constitutional: He is oriented to person, place, and time and well-developed, well-nourished, and in no distress.  HENT:  Head: Normocephalic and atraumatic.  Eyes: EOM are normal.  Neck: Normal range of motion.  Cardiovascular: Normal rate, regular rhythm, normal heart sounds and intact distal pulses. Exam reveals no gallop and no friction rub.  No murmur heard. Pulmonary/Chest: Effort normal. No respiratory distress. He exhibits no tenderness.  Abdominal: Soft. Bowel sounds are normal. He exhibits no distension. There is no guarding.  Musculoskeletal:        General: Tenderness present. No edema.  Neurological: He is alert and oriented to person, place, and time. He is not disoriented. He displays weakness and tremor (When he tries to move his leg.).  Left upper extremity strength: 2/5 patient able to lift his hand minimally against gravity.  This is improved from yesterday Left lower extremity strength: 1/5 patient able to plantar flex foot minimally. Right upper extremity strength: 4/5 Right lower extremity strength: 4 out of 5  Skin: Skin is warm and dry.    Pertinent labs/Imaging: CBC Latest Ref Rng & Units 01/04/2019 01/03/2019 01/02/2019  WBC 4.0 - 10.5 K/uL 9.2 9.1 7.9  Hemoglobin  13.0 - 17.0 g/dL 12.8(L) 13.2 13.5  Hematocrit 39.0 - 52.0 % 36.6(L) 38.6(L) 39.6  Platelets 150 - 400 K/uL 221 221 215    CMP Latest Ref Rng & Units 01/05/2019 01/04/2019 01/02/2019  Glucose 70 - 99 mg/dL 98 100(H) 115(H)  BUN 8 - 23 mg/dL 14 13 16   Creatinine 0.61 - 1.24 mg/dL 0.95 0.90 1.04  Sodium 135 - 145 mmol/L 135 134(L) 136  Potassium 3.5 - 5.1 mmol/L 4.1 3.9 4.1  Chloride 98 - 111 mmol/L 101 104 103  CO2 22 - 32 mmol/L 23 22 23   Calcium 8.9 - 10.3 mg/dL 9.0 9.0 9.1  Total Protein 6.5 - 8.1 g/dL - - 6.8  Total Bilirubin 0.3 - 1.2 mg/dL - - 0.7  Alkaline Phos 38 - 126 U/L - - 74  AST 15 - 41 U/L - - 23  ALT 0 - 44 U/L - - 14    Assessment/Plan:  Active Problems:   CVA (cerebral vascular accident) [facility])   Patient Summary: Mr. Lovena Le is a 68 year old male with a pertinent past medical history of PAD, CVA, stable right frontal meningioma, GERD, HLD, who presented with acute onset left-sided weakness due to acute CVA.  #CVA -Continue permissive hypertension.  Patient has been normotensive at this time off of antihypertensive medications.  Patient will need close follow-up with primary care physician in order to restart hypertensive medications as needed. - Continue DAPT. We will  discontinue cilostazol after 3 weeks and continue clopidogrel daily - Continue atorvastatin 80 mg -Patient will be discharged to SNF for PT - Patient has worsening left lower extremity pain secondary to muscle contractures. I will discharge him with a muscle relaxant to help with his pain.  #Acute vs Chronic Encephalopathy: - appears to be at baseline.  #CAD: - Continue ASA/Plavix  #HTN: -Patient continues to be normotensive despite being off antihypertensive medication.  We will hold these until he is able to follow-up with his primary care doctor to reevaluate his need for antihypertensive meds.   Diet: Heart Healthy IVF: None VTE: Enoxaparin Code: DNI  Dispo: Anticipated discharge pending  COVID test and SNF placement.   Marianna Payment, MD 01/05/2019, 12:19 PM Pager: [phone]

## 2019-01-08 DIAGNOSIS — I639 Cerebral infarction, unspecified: Secondary | ICD-10-CM | POA: Diagnosis not present

## 2019-01-08 DIAGNOSIS — R5381 Other malaise: Secondary | ICD-10-CM | POA: Diagnosis not present

## 2019-01-08 DIAGNOSIS — E785 Hyperlipidemia, unspecified: Secondary | ICD-10-CM | POA: Diagnosis not present

## 2019-01-08 DIAGNOSIS — I10 Essential (primary) hypertension: Secondary | ICD-10-CM | POA: Diagnosis not present

## 2019-01-30 DIAGNOSIS — R2689 Other abnormalities of gait and mobility: Secondary | ICD-10-CM | POA: Diagnosis not present

## 2019-01-30 DIAGNOSIS — M62471 Contracture of muscle, right ankle and foot: Secondary | ICD-10-CM | POA: Diagnosis not present

## 2019-01-30 DIAGNOSIS — K219 Gastro-esophageal reflux disease without esophagitis: Secondary | ICD-10-CM | POA: Diagnosis not present

## 2019-01-30 DIAGNOSIS — R7303 Prediabetes: Secondary | ICD-10-CM | POA: Diagnosis not present

## 2019-01-30 DIAGNOSIS — I639 Cerebral infarction, unspecified: Secondary | ICD-10-CM | POA: Diagnosis not present

## 2019-01-30 DIAGNOSIS — I739 Peripheral vascular disease, unspecified: Secondary | ICD-10-CM | POA: Diagnosis not present

## 2019-01-30 DIAGNOSIS — E785 Hyperlipidemia, unspecified: Secondary | ICD-10-CM | POA: Diagnosis not present

## 2019-01-30 DIAGNOSIS — D329 Benign neoplasm of meninges, unspecified: Secondary | ICD-10-CM | POA: Diagnosis not present

## 2019-01-30 DIAGNOSIS — R1311 Dysphagia, oral phase: Secondary | ICD-10-CM | POA: Diagnosis not present

## 2019-01-30 DIAGNOSIS — I10 Essential (primary) hypertension: Secondary | ICD-10-CM | POA: Diagnosis not present

## 2019-01-30 DIAGNOSIS — M6281 Muscle weakness (generalized): Secondary | ICD-10-CM | POA: Diagnosis not present

## 2019-01-30 DIAGNOSIS — Z741 Need for assistance with personal care: Secondary | ICD-10-CM | POA: Diagnosis not present

## 2019-01-30 DIAGNOSIS — R488 Other symbolic dysfunctions: Secondary | ICD-10-CM | POA: Diagnosis not present

## 2019-01-30 DIAGNOSIS — I6501 Occlusion and stenosis of right vertebral artery: Secondary | ICD-10-CM | POA: Diagnosis not present

## 2019-01-30 DIAGNOSIS — I679 Cerebrovascular disease, unspecified: Secondary | ICD-10-CM | POA: Diagnosis not present

## 2019-01-30 DIAGNOSIS — R5381 Other malaise: Secondary | ICD-10-CM | POA: Diagnosis not present

## 2019-02-09 ENCOUNTER — Telehealth: Payer: Self-pay | Admitting: Adult Health

## 2019-02-09 DIAGNOSIS — E785 Hyperlipidemia, unspecified: Secondary | ICD-10-CM | POA: Diagnosis not present

## 2019-02-09 DIAGNOSIS — I639 Cerebral infarction, unspecified: Secondary | ICD-10-CM | POA: Diagnosis not present

## 2019-02-09 DIAGNOSIS — R2689 Other abnormalities of gait and mobility: Secondary | ICD-10-CM | POA: Diagnosis not present

## 2019-02-09 DIAGNOSIS — M6281 Muscle weakness (generalized): Secondary | ICD-10-CM | POA: Diagnosis not present

## 2019-02-09 DIAGNOSIS — I10 Essential (primary) hypertension: Secondary | ICD-10-CM | POA: Diagnosis not present

## 2019-02-09 DIAGNOSIS — Z741 Need for assistance with personal care: Secondary | ICD-10-CM | POA: Diagnosis not present

## 2019-02-09 DIAGNOSIS — R5381 Other malaise: Secondary | ICD-10-CM | POA: Diagnosis not present

## 2019-02-09 NOTE — Telephone Encounter (Signed)
I attempted to contact patient regarding 1/14 appointment, due to NP out of office. Patient does not have a voicemail set up. I called patient's wife and LVM requesting she or patient call back to reschedule.

## 2019-02-12 ENCOUNTER — Inpatient Hospital Stay: Payer: Self-pay | Admitting: Adult Health

## 2019-02-12 ENCOUNTER — Telehealth: Payer: Self-pay | Admitting: Internal Medicine

## 2019-02-12 DIAGNOSIS — D329 Benign neoplasm of meninges, unspecified: Secondary | ICD-10-CM | POA: Diagnosis not present

## 2019-02-12 DIAGNOSIS — Z993 Dependence on wheelchair: Secondary | ICD-10-CM | POA: Diagnosis not present

## 2019-02-12 DIAGNOSIS — E785 Hyperlipidemia, unspecified: Secondary | ICD-10-CM | POA: Diagnosis not present

## 2019-02-12 DIAGNOSIS — I739 Peripheral vascular disease, unspecified: Secondary | ICD-10-CM | POA: Diagnosis not present

## 2019-02-12 DIAGNOSIS — M545 Low back pain: Secondary | ICD-10-CM | POA: Diagnosis not present

## 2019-02-12 DIAGNOSIS — Z9181 History of falling: Secondary | ICD-10-CM | POA: Diagnosis not present

## 2019-02-12 DIAGNOSIS — R7303 Prediabetes: Secondary | ICD-10-CM | POA: Diagnosis not present

## 2019-02-12 DIAGNOSIS — R1310 Dysphagia, unspecified: Secondary | ICD-10-CM | POA: Diagnosis not present

## 2019-02-12 DIAGNOSIS — I10 Essential (primary) hypertension: Secondary | ICD-10-CM | POA: Diagnosis not present

## 2019-02-12 DIAGNOSIS — I69354 Hemiplegia and hemiparesis following cerebral infarction affecting left non-dominant side: Secondary | ICD-10-CM | POA: Diagnosis not present

## 2019-02-12 NOTE — Telephone Encounter (Signed)
Physical Therapist  Darnelle Going [phone] from  [facility] calling  for VO to start PT for  2 a week  for 4 weeks. Please call back.

## 2019-02-12 NOTE — Telephone Encounter (Signed)
Cordelia Pen PT with [facility] - requesting verbal order for " PT twice a week x 4 weeks for w/c mobility and transfer". VO given - if not appropriate, let me know.

## 2019-02-13 NOTE — Telephone Encounter (Signed)
I agree

## 2019-02-15 DIAGNOSIS — Z993 Dependence on wheelchair: Secondary | ICD-10-CM | POA: Diagnosis not present

## 2019-02-15 DIAGNOSIS — R7303 Prediabetes: Secondary | ICD-10-CM | POA: Diagnosis not present

## 2019-02-15 DIAGNOSIS — E785 Hyperlipidemia, unspecified: Secondary | ICD-10-CM | POA: Diagnosis not present

## 2019-02-15 DIAGNOSIS — Z9181 History of falling: Secondary | ICD-10-CM | POA: Diagnosis not present

## 2019-02-15 DIAGNOSIS — R1310 Dysphagia, unspecified: Secondary | ICD-10-CM | POA: Diagnosis not present

## 2019-02-15 DIAGNOSIS — I739 Peripheral vascular disease, unspecified: Secondary | ICD-10-CM | POA: Diagnosis not present

## 2019-02-15 DIAGNOSIS — I69354 Hemiplegia and hemiparesis following cerebral infarction affecting left non-dominant side: Secondary | ICD-10-CM | POA: Diagnosis not present

## 2019-02-15 DIAGNOSIS — I10 Essential (primary) hypertension: Secondary | ICD-10-CM | POA: Diagnosis not present

## 2019-02-15 DIAGNOSIS — M545 Low back pain: Secondary | ICD-10-CM | POA: Diagnosis not present

## 2019-02-15 DIAGNOSIS — D329 Benign neoplasm of meninges, unspecified: Secondary | ICD-10-CM | POA: Diagnosis not present

## 2019-02-16 DIAGNOSIS — R7303 Prediabetes: Secondary | ICD-10-CM | POA: Diagnosis not present

## 2019-02-16 DIAGNOSIS — I69354 Hemiplegia and hemiparesis following cerebral infarction affecting left non-dominant side: Secondary | ICD-10-CM | POA: Diagnosis not present

## 2019-02-16 DIAGNOSIS — M545 Low back pain: Secondary | ICD-10-CM | POA: Diagnosis not present

## 2019-02-16 DIAGNOSIS — D329 Benign neoplasm of meninges, unspecified: Secondary | ICD-10-CM | POA: Diagnosis not present

## 2019-02-16 DIAGNOSIS — Z993 Dependence on wheelchair: Secondary | ICD-10-CM | POA: Diagnosis not present

## 2019-02-16 DIAGNOSIS — E785 Hyperlipidemia, unspecified: Secondary | ICD-10-CM | POA: Diagnosis not present

## 2019-02-16 DIAGNOSIS — I739 Peripheral vascular disease, unspecified: Secondary | ICD-10-CM | POA: Diagnosis not present

## 2019-02-16 DIAGNOSIS — R1310 Dysphagia, unspecified: Secondary | ICD-10-CM | POA: Diagnosis not present

## 2019-02-16 DIAGNOSIS — I10 Essential (primary) hypertension: Secondary | ICD-10-CM | POA: Diagnosis not present

## 2019-02-16 DIAGNOSIS — Z9181 History of falling: Secondary | ICD-10-CM | POA: Diagnosis not present

## 2019-02-17 ENCOUNTER — Telehealth: Payer: Self-pay | Admitting: Internal Medicine

## 2019-02-17 DIAGNOSIS — Z993 Dependence on wheelchair: Secondary | ICD-10-CM | POA: Diagnosis not present

## 2019-02-17 DIAGNOSIS — M545 Low back pain: Secondary | ICD-10-CM | POA: Diagnosis not present

## 2019-02-17 DIAGNOSIS — I10 Essential (primary) hypertension: Secondary | ICD-10-CM | POA: Diagnosis not present

## 2019-02-17 DIAGNOSIS — I739 Peripheral vascular disease, unspecified: Secondary | ICD-10-CM | POA: Diagnosis not present

## 2019-02-17 DIAGNOSIS — Z9181 History of falling: Secondary | ICD-10-CM | POA: Diagnosis not present

## 2019-02-17 DIAGNOSIS — R7303 Prediabetes: Secondary | ICD-10-CM | POA: Diagnosis not present

## 2019-02-17 DIAGNOSIS — R1310 Dysphagia, unspecified: Secondary | ICD-10-CM | POA: Diagnosis not present

## 2019-02-17 DIAGNOSIS — I69354 Hemiplegia and hemiparesis following cerebral infarction affecting left non-dominant side: Secondary | ICD-10-CM | POA: Diagnosis not present

## 2019-02-17 DIAGNOSIS — D329 Benign neoplasm of meninges, unspecified: Secondary | ICD-10-CM | POA: Diagnosis not present

## 2019-02-17 DIAGNOSIS — E785 Hyperlipidemia, unspecified: Secondary | ICD-10-CM | POA: Diagnosis not present

## 2019-02-17 NOTE — Telephone Encounter (Signed)
Pt need standard drop arm bedside commode to adapt. Will send to lauren and mamie  Also dr Darrick Meigs do you agree with VO for OT 2x week for 1 week 2x week for 2 weeks 1x week for 1 week Gave the VO

## 2019-02-17 NOTE — Telephone Encounter (Signed)
Stephen [name] from [place] [facility] [id number]

## 2019-02-17 NOTE — Telephone Encounter (Signed)
I agree. Thank you.

## 2019-02-18 ENCOUNTER — Telehealth: Payer: Self-pay | Admitting: Internal Medicine

## 2019-02-18 ENCOUNTER — Telehealth: Payer: Self-pay

## 2019-02-18 DIAGNOSIS — Z9181 History of falling: Secondary | ICD-10-CM | POA: Diagnosis not present

## 2019-02-18 DIAGNOSIS — R1310 Dysphagia, unspecified: Secondary | ICD-10-CM | POA: Diagnosis not present

## 2019-02-18 DIAGNOSIS — I10 Essential (primary) hypertension: Secondary | ICD-10-CM | POA: Diagnosis not present

## 2019-02-18 DIAGNOSIS — I69354 Hemiplegia and hemiparesis following cerebral infarction affecting left non-dominant side: Secondary | ICD-10-CM | POA: Diagnosis not present

## 2019-02-18 DIAGNOSIS — E785 Hyperlipidemia, unspecified: Secondary | ICD-10-CM | POA: Diagnosis not present

## 2019-02-18 DIAGNOSIS — Z993 Dependence on wheelchair: Secondary | ICD-10-CM | POA: Diagnosis not present

## 2019-02-18 DIAGNOSIS — D329 Benign neoplasm of meninges, unspecified: Secondary | ICD-10-CM | POA: Diagnosis not present

## 2019-02-18 DIAGNOSIS — R7303 Prediabetes: Secondary | ICD-10-CM | POA: Diagnosis not present

## 2019-02-18 DIAGNOSIS — M545 Low back pain: Secondary | ICD-10-CM | POA: Diagnosis not present

## 2019-02-18 DIAGNOSIS — I739 Peripheral vascular disease, unspecified: Secondary | ICD-10-CM | POA: Diagnosis not present

## 2019-02-18 DIAGNOSIS — M6281 Muscle weakness (generalized): Secondary | ICD-10-CM | POA: Insufficient documentation

## 2019-02-18 NOTE — Telephone Encounter (Signed)
Received TC from [place], [place] with [facility], she is requesting VO for disease management from nursing services for:  2X/week for 2 weeks 1X/week for 1 week  VO given.   Will forward to PCP for agreement or denial. Higinio Roger, RN,BSN

## 2019-02-18 NOTE — Telephone Encounter (Signed)
yes

## 2019-02-18 NOTE — Telephone Encounter (Signed)
[place] calls for diag code r/t HH PT and OT, states insurance is denying CVA as the diag for [place], added ACUTE LEFT SIDED MUSCLE WEAKNESS R/T CVA diag code M62.81 Do you agree?

## 2019-02-18 NOTE — Telephone Encounter (Signed)
Per peter from [facility] [phone]

## 2019-02-19 DIAGNOSIS — Z9181 History of falling: Secondary | ICD-10-CM | POA: Diagnosis not present

## 2019-02-19 DIAGNOSIS — I10 Essential (primary) hypertension: Secondary | ICD-10-CM | POA: Diagnosis not present

## 2019-02-19 DIAGNOSIS — Z993 Dependence on wheelchair: Secondary | ICD-10-CM | POA: Diagnosis not present

## 2019-02-19 DIAGNOSIS — R7303 Prediabetes: Secondary | ICD-10-CM | POA: Diagnosis not present

## 2019-02-19 DIAGNOSIS — E785 Hyperlipidemia, unspecified: Secondary | ICD-10-CM | POA: Diagnosis not present

## 2019-02-19 DIAGNOSIS — M545 Low back pain: Secondary | ICD-10-CM | POA: Diagnosis not present

## 2019-02-19 DIAGNOSIS — D329 Benign neoplasm of meninges, unspecified: Secondary | ICD-10-CM | POA: Diagnosis not present

## 2019-02-19 DIAGNOSIS — I69354 Hemiplegia and hemiparesis following cerebral infarction affecting left non-dominant side: Secondary | ICD-10-CM | POA: Diagnosis not present

## 2019-02-19 DIAGNOSIS — I739 Peripheral vascular disease, unspecified: Secondary | ICD-10-CM | POA: Diagnosis not present

## 2019-02-19 DIAGNOSIS — R1310 Dysphagia, unspecified: Secondary | ICD-10-CM | POA: Diagnosis not present

## 2019-02-19 NOTE — Telephone Encounter (Signed)
I agree

## 2019-02-20 NOTE — Telephone Encounter (Signed)
Patient has appt with PCP on 02/24/2019 at 2:15. Patient made aware that need for drop arm BSC can be discussed at that time. Patient is in agreement. Hubbard Hartshorn, BSN, RN-BC

## 2019-02-24 ENCOUNTER — Ambulatory Visit (HOSPITAL_COMMUNITY)
Admission: RE | Admit: 2019-02-24 | Discharge: 2019-02-24 | Disposition: A | Discharge status: Home / Self Care | Payer: Medicare Other | Source: Ambulatory Visit | Attending: Internal Medicine | Admitting: Internal Medicine

## 2019-02-24 ENCOUNTER — Encounter: Payer: Self-pay | Admitting: Internal Medicine

## 2019-02-24 ENCOUNTER — Ambulatory Visit (INDEPENDENT_AMBULATORY_CARE_PROVIDER_SITE_OTHER): Payer: Medicare Other | Admitting: Internal Medicine

## 2019-02-24 ENCOUNTER — Other Ambulatory Visit: Payer: Self-pay

## 2019-02-24 VITALS — BP 129/83 | HR 92 | Temp 98.1°F | Ht 71.0 in | Wt 142.5 lb

## 2019-02-24 DIAGNOSIS — R64 Cachexia: Secondary | ICD-10-CM

## 2019-02-24 DIAGNOSIS — R911 Solitary pulmonary nodule: Secondary | ICD-10-CM

## 2019-02-24 DIAGNOSIS — M545 Low back pain: Secondary | ICD-10-CM | POA: Diagnosis not present

## 2019-02-24 DIAGNOSIS — Z7902 Long term (current) use of antithrombotics/antiplatelets: Secondary | ICD-10-CM

## 2019-02-24 DIAGNOSIS — R0602 Shortness of breath: Secondary | ICD-10-CM

## 2019-02-24 DIAGNOSIS — I10 Essential (primary) hypertension: Secondary | ICD-10-CM | POA: Diagnosis not present

## 2019-02-24 DIAGNOSIS — M79604 Pain in right leg: Secondary | ICD-10-CM

## 2019-02-24 DIAGNOSIS — Z993 Dependence on wheelchair: Secondary | ICD-10-CM | POA: Diagnosis not present

## 2019-02-24 DIAGNOSIS — Z9181 History of falling: Secondary | ICD-10-CM | POA: Diagnosis not present

## 2019-02-24 DIAGNOSIS — R1310 Dysphagia, unspecified: Secondary | ICD-10-CM | POA: Diagnosis not present

## 2019-02-24 DIAGNOSIS — E785 Hyperlipidemia, unspecified: Secondary | ICD-10-CM | POA: Diagnosis not present

## 2019-02-24 DIAGNOSIS — I69351 Hemiplegia and hemiparesis following cerebral infarction affecting right dominant side: Secondary | ICD-10-CM | POA: Diagnosis not present

## 2019-02-24 DIAGNOSIS — I63311 Cerebral infarction due to thrombosis of right middle cerebral artery: Secondary | ICD-10-CM

## 2019-02-24 DIAGNOSIS — D329 Benign neoplasm of meninges, unspecified: Secondary | ICD-10-CM | POA: Diagnosis not present

## 2019-02-24 DIAGNOSIS — I739 Peripheral vascular disease, unspecified: Secondary | ICD-10-CM | POA: Diagnosis not present

## 2019-02-24 DIAGNOSIS — R7303 Prediabetes: Secondary | ICD-10-CM | POA: Diagnosis not present

## 2019-02-24 DIAGNOSIS — R0989 Other specified symptoms and signs involving the circulatory and respiratory systems: Secondary | ICD-10-CM

## 2019-02-24 DIAGNOSIS — Z79899 Other long term (current) drug therapy: Secondary | ICD-10-CM

## 2019-02-24 DIAGNOSIS — I69354 Hemiplegia and hemiparesis following cerebral infarction affecting left non-dominant side: Secondary | ICD-10-CM | POA: Diagnosis not present

## 2019-02-24 DIAGNOSIS — Z681 Body mass index (BMI) 19 or less, adult: Secondary | ICD-10-CM

## 2019-02-24 MED ORDER — AMLODIPINE BESYLATE 10 MG PO TABS: 10.0000 mg | ORAL_TABLET | Freq: Every day | ORAL | 0 refills | Status: DC

## 2019-02-24 MED ORDER — RIVAROXABAN 20 MG PO TABS: 20.0000 mg | ORAL_TABLET | Freq: Every day | ORAL | Status: DC

## 2019-02-24 MED ORDER — RIVAROXABAN 10 MG PO TABS
20.0000 mg | ORAL_TABLET | Freq: Once | ORAL | Status: AC
  Administered 2019-02-24: 16:00:00 20 mg via ORAL

## 2019-02-24 MED ORDER — HYDROCHLOROTHIAZIDE 12.5 MG PO CAPS: 25.0000 mg | ORAL_CAPSULE | Freq: Every day | ORAL | 0 refills | Status: DC

## 2019-02-24 NOTE — Progress Notes (Signed)
CC: CVA  HPI:  [name] is a 69 y.o. male who presents for post hospital for CVA. Please see problem based assessment and plan for additional details.     Past Medical History:  Diagnosis Date  . Alcohol abuse   . Arthropathy    shoulder  . Chest pain   . Chronic cough   . Foot pain    bilateral  . Hyperlipidemia   . Hypertension   . Intermittent claudication (HCC)    bilateral  . Latent tuberculosis   . Lumbago   . Shoulder pain     Review of Systems:  Review of Systems - General ROS: negative for - chills or fever Respiratory ROS: positive for - cough, shortness of breath, sputum changes and tachypnea Cardiovascular ROS: negative for - chest pain Musculoskeletal ROS: positive for - gait disturbance and muscular weakness Neurological ROS: positive for - CVA Dermatological ROS: negative for rash   Physical Exam:  Vitals:   02/24/19 1434  BP: 129/83  Pulse: 92  Temp: 98.1 F (36.7 C)  TempSrc: Oral  SpO2: 100%  Weight: 142 lb 8 oz (64.6 kg)  Height: 5\' 11"  (1.803 m)    GENERAL: chronically ill appearing male in wheelchair. Cachetic appearing CARDIAC: heart regular rate and rhythm PULMONARY: diminished lung sounds throughout MSK: pain with palpation over the right lower leg and posterior popliteal region NEURO: 3/5 lower extremity strength. 4/5 UE strength. No facial asymmetry appreciated.   Assessment & Plan:   1. Lacunar CVA. 69 yo male presents today with his wife for post hospital. 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 He suffered significant motor deficits from this and was discharged home with home health PT.  Since discharging home, patient has continued to have significant motor deficits resulting in him requiring 24h assistance. He is unable to contribute much to the conversation today and wife is the primary historian. She is also his primary caregiver however they are separated and she does  not live with him. She does note that she spends the majority of her time at his home though. She does not plan to continue to be able to do this however. Leane Para presents today in a wheelchair and PE demonstrates very poor motor strength. On exam, he is barely able to lift his leg an inch off the wheelchair foot rest. The wife notes that he is able to stand and is able to take a couple of steps into the restroom with assistance. They deny that he has fell at home. He was discharged home on DAPT x3w and then plavix alone which wife reports he has been taking. His antihypertensives were also discontinued at discharge however he has been taking them at home and his blood pressure is good in the clinic today. Plan -continue plavix, statin -will have him continue his antihypertensives since his blood pressure appears well controlled in the clinic today  -continue home health PT -discussed with him and his wife that they will need to discuss how long she plans on being his primary caregiver as it is clear to me that he will not be able to care for himself at home independently and will likely require 24h care.  2. Lung nodule: Patient presented for hospital follow up after CVA. He noted that he has been having a productive cough but no known fever.  CXR was obtained today which showed a 1 cm nodular opacity in the anterior mediastinum, not seen on most  recent lateral chest radiograph. Also questionable retraction in the right hilum seen on the frontal view. No infiltrate or other findings noted. No significant leukocytosis on CBC. 25+ pack year smoking history. Assessment: CXR initially obtained to evaluate for a pneumonia. Given CXR findings and lack of significant leukocytosis or fever, unlikely to be a pneumonia. Given his smoking history and CXR findings however, a malignancy is very possible. Given the location and possible need for EBUS, will discuss with pulm as to which imaging technique they would  recommend--regular CT with contrast vs Supra-D CT.  Plan:  -discussed results with wife and need for follow up imaging.  -Will order imaging once I have spoke with pulm.  3. PE rule out: Patient presents today for CVA post hospital follow up. He also notes a productive cough and some dyspnea since discharge. Denies pleuritic type chest pain however did endorse right leg pain. PE negative for tachycardia or tachypnea however right lower leg was slightly warm to touch and was equisitly tender along the lateral and posterior popliteal region. Given his recent history of stroke and immobility in addition to his symptoms, I felt that he warranted a CXR and LE duplex to rule out DVT. Assessment: Since his visit was at the end of the day and it was unclear if he would receive the duplex today, I did give him one dose of xarelto in the clinic to cover him until the duplex was back. CXR did not demonstrate findings consistent with PE however did show a lung nodule. See "lung nodule" problem for further details. 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  PE much less likely now given the negative duplex and normal VS. Well's score is low risk. Plan:  -no further workup for PE however will be obtaining a CT for the lung nodule which should also be able to finish r/o PE.   Patient seen with Dr. Marylyn Ishihara, MD Internal Medicine Resident-PGY1 02/24/19

## 2019-02-25 ENCOUNTER — Telehealth: Payer: Self-pay | Admitting: *Deleted

## 2019-02-25 DIAGNOSIS — R1310 Dysphagia, unspecified: Secondary | ICD-10-CM | POA: Diagnosis not present

## 2019-02-25 DIAGNOSIS — I69354 Hemiplegia and hemiparesis following cerebral infarction affecting left non-dominant side: Secondary | ICD-10-CM | POA: Diagnosis not present

## 2019-02-25 DIAGNOSIS — I10 Essential (primary) hypertension: Secondary | ICD-10-CM | POA: Diagnosis not present

## 2019-02-25 DIAGNOSIS — D329 Benign neoplasm of meninges, unspecified: Secondary | ICD-10-CM | POA: Diagnosis not present

## 2019-02-25 DIAGNOSIS — I739 Peripheral vascular disease, unspecified: Secondary | ICD-10-CM | POA: Diagnosis not present

## 2019-02-25 DIAGNOSIS — R7303 Prediabetes: Secondary | ICD-10-CM | POA: Diagnosis not present

## 2019-02-25 DIAGNOSIS — E785 Hyperlipidemia, unspecified: Secondary | ICD-10-CM | POA: Diagnosis not present

## 2019-02-25 DIAGNOSIS — Z993 Dependence on wheelchair: Secondary | ICD-10-CM | POA: Diagnosis not present

## 2019-02-25 DIAGNOSIS — M545 Low back pain: Secondary | ICD-10-CM | POA: Diagnosis not present

## 2019-02-25 DIAGNOSIS — Z9181 History of falling: Secondary | ICD-10-CM | POA: Diagnosis not present

## 2019-02-25 LAB — CBC
Hematocrit: 39.8 % (ref 37.5–51.0)
Hemoglobin: 13.4 g/dL (ref 13.0–17.7)
MCH: 27.6 pg (ref 26.6–33.0)
MCHC: 33.7 g/dL (ref 31.5–35.7)
MCV: 82 fL (ref 79–97)
Platelets: 399 10*3/uL (ref 150–450)
RBC: 4.86 x10E6/uL (ref 4.14–5.80)
RDW: 15.4 % (ref 11.6–15.4)
WBC: 11 10*3/uL — ABNORMAL HIGH (ref 3.4–10.8)

## 2019-02-25 NOTE — Telephone Encounter (Signed)
Spoke with PCP regarding request for [facility]. States at [place] on 02/24/2019 patient's wife declined need for this as he has a urinal. L. [place], BSN, RN-BC

## 2019-02-25 NOTE — Telephone Encounter (Signed)
Stephen [name] with [facility] called to report new finding on CXR taken 02/24/2019. Report is in [place].  IMPRESSION: Questionable 1 cm nodular opacity in the anterior mediastinum at the level of the aortic valve on the lateral view, not seen on most recent lateral chest radiograph. Also questionable retraction in the right hilum seen on the frontal view. These findings warrant correlation with contrast enhanced chest CT to further evaluate  L. Torin Whisner, BSN, RN-BC

## 2019-02-26 ENCOUNTER — Other Ambulatory Visit: Payer: Self-pay | Admitting: Internal Medicine

## 2019-02-26 DIAGNOSIS — R911 Solitary pulmonary nodule: Secondary | ICD-10-CM | POA: Insufficient documentation

## 2019-02-26 DIAGNOSIS — R0989 Other specified symptoms and signs involving the circulatory and respiratory systems: Secondary | ICD-10-CM | POA: Insufficient documentation

## 2019-02-26 NOTE — Assessment & Plan Note (Signed)
Antihypertensives discontinued at hospital discharge however blood pressure appears well controlled in the clinic today and he has been taking his antihypertensives at home. Will have him continue taking these.

## 2019-02-26 NOTE — Assessment & Plan Note (Addendum)
Patient presents today for CVA post hospital follow up. He also notes a productive cough and some dyspnea since discharge. Denies pleuritic type chest pain however did endorse right leg pain. PE negative for tachycardia or tachypnea however right lower leg was slightly warm to touch and was equisitly tender along the lateral and posterior popliteal region. Given his recent history of stroke and immobility in addition to his symptoms, I felt that he warranted a CXR and LE duplex to rule out DVT.  Assessment: Since his visit was at the end of the day and it was unclear if he would receive the duplex today, I did give him one dose of xarelto in the clinic to cover him until the duplex was back. CXR did not demonstrate findings consistent with PE however did show a lung nodule. See "lung nodule" problem for further details. 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  PE much less likely now given the negative duplex and normal VS. Well's score is low risk.  Plan: no further workup for PE however will be obtaining a CT for the lung nodule which should also be able to finish r/o PE.

## 2019-02-26 NOTE — Assessment & Plan Note (Signed)
69 yo male presents today with his wife for post hospital. 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 He suffered significant motor deficits from this and was discharged home with home health PT.  Since discharging home, patient has continued to have significant motor deficits resulting in him requiring 24h assistance. He is unable to contribute much to the conversation today and wife is the primary historian. She is also his primary caregiver however they are separated and she does not live with him. She does note that she spends the majority of her time at his home though. She does not plan to continue to be able to do this however. Leane Para presents today in a wheelchair and PE demonstrates very poor motor strength. On exam, he is barely able to lift his leg an inch off the wheelchair foot rest. The wife notes that he is able to stand and is able to take a couple of steps into the restroom with assistance. They deny that he has fell at home. He was discharged home on DAPT x3w and then plavix alone which wife reports he has been taking. His antihypertensives were also discontinued at discharge however he has been taking them at home and his blood pressure is good in the clinic today.  Plan -continue plavix, statin -will have him continue his antihypertensives since his blood pressure appears well controlled in the clinic today  -continue home health PT -discussed with him and his wife that they will need to discuss how long she plans on being his primary caregiver as it is clear to me that he will not be able to care for himself at home independently and will likely require 24h care.

## 2019-02-26 NOTE — Assessment & Plan Note (Signed)
Patient presented for hospital follow up after CVA. He noted that he has been having a productive cough but no known fever.  CXR was obtained today which showed a 1 cm nodular opacity in the anterior mediastinum, not seen on most recent lateral chest radiograph. Also questionable retraction in the right hilum seen on the frontal view. No infiltrate or other findings noted. No significant leukocytosis on CBC. 25+ pack year smoking history.  Assessment: CXR initially obtained to evaluate for a pneumonia. Given CXR findings and lack of significant leukocytosis or fever, unlikely to be a pneumonia. Given his smoking history and CXR findings however, a malignancy is very possible. Given the location and possible need for EBUS, will discuss with pulm as to which imaging technique they would recommend--regular CT with contrast vs Supra-D CT.   Plan: discussed results with wife and need for follow up imaging. Will order imaging once I have spoke with pulm.

## 2019-02-27 ENCOUNTER — Other Ambulatory Visit: Payer: Self-pay | Admitting: *Deleted

## 2019-02-27 DIAGNOSIS — E785 Hyperlipidemia, unspecified: Secondary | ICD-10-CM | POA: Diagnosis not present

## 2019-02-27 DIAGNOSIS — I739 Peripheral vascular disease, unspecified: Secondary | ICD-10-CM | POA: Diagnosis not present

## 2019-02-27 DIAGNOSIS — Z9181 History of falling: Secondary | ICD-10-CM | POA: Diagnosis not present

## 2019-02-27 DIAGNOSIS — D329 Benign neoplasm of meninges, unspecified: Secondary | ICD-10-CM | POA: Diagnosis not present

## 2019-02-27 DIAGNOSIS — R7303 Prediabetes: Secondary | ICD-10-CM | POA: Diagnosis not present

## 2019-02-27 DIAGNOSIS — I10 Essential (primary) hypertension: Secondary | ICD-10-CM | POA: Diagnosis not present

## 2019-02-27 DIAGNOSIS — M545 Low back pain: Secondary | ICD-10-CM | POA: Diagnosis not present

## 2019-02-27 DIAGNOSIS — Z993 Dependence on wheelchair: Secondary | ICD-10-CM | POA: Diagnosis not present

## 2019-02-27 DIAGNOSIS — I69354 Hemiplegia and hemiparesis following cerebral infarction affecting left non-dominant side: Secondary | ICD-10-CM | POA: Diagnosis not present

## 2019-02-27 DIAGNOSIS — R1310 Dysphagia, unspecified: Secondary | ICD-10-CM | POA: Diagnosis not present

## 2019-03-02 DIAGNOSIS — I739 Peripheral vascular disease, unspecified: Secondary | ICD-10-CM | POA: Diagnosis not present

## 2019-03-02 DIAGNOSIS — I10 Essential (primary) hypertension: Secondary | ICD-10-CM | POA: Diagnosis not present

## 2019-03-02 DIAGNOSIS — I69354 Hemiplegia and hemiparesis following cerebral infarction affecting left non-dominant side: Secondary | ICD-10-CM | POA: Diagnosis not present

## 2019-03-02 DIAGNOSIS — R1310 Dysphagia, unspecified: Secondary | ICD-10-CM | POA: Diagnosis not present

## 2019-03-02 DIAGNOSIS — Z993 Dependence on wheelchair: Secondary | ICD-10-CM | POA: Diagnosis not present

## 2019-03-02 DIAGNOSIS — D329 Benign neoplasm of meninges, unspecified: Secondary | ICD-10-CM | POA: Diagnosis not present

## 2019-03-02 DIAGNOSIS — Z9181 History of falling: Secondary | ICD-10-CM | POA: Diagnosis not present

## 2019-03-02 DIAGNOSIS — R7303 Prediabetes: Secondary | ICD-10-CM | POA: Diagnosis not present

## 2019-03-02 DIAGNOSIS — E785 Hyperlipidemia, unspecified: Secondary | ICD-10-CM | POA: Diagnosis not present

## 2019-03-02 DIAGNOSIS — M545 Low back pain: Secondary | ICD-10-CM | POA: Diagnosis not present

## 2019-03-02 NOTE — Progress Notes (Signed)
Internal Medicine Clinic Attending  Case discussed with Dr. Christian at the time of the visit.  We reviewed the resident's history and exam and pertinent patient test results.  I agree with the assessment, diagnosis, and plan of care documented in the resident's note.    

## 2019-03-03 DIAGNOSIS — D329 Benign neoplasm of meninges, unspecified: Secondary | ICD-10-CM | POA: Diagnosis not present

## 2019-03-03 DIAGNOSIS — R7303 Prediabetes: Secondary | ICD-10-CM | POA: Diagnosis not present

## 2019-03-03 DIAGNOSIS — I739 Peripheral vascular disease, unspecified: Secondary | ICD-10-CM | POA: Diagnosis not present

## 2019-03-03 DIAGNOSIS — R1310 Dysphagia, unspecified: Secondary | ICD-10-CM | POA: Diagnosis not present

## 2019-03-03 DIAGNOSIS — E785 Hyperlipidemia, unspecified: Secondary | ICD-10-CM | POA: Diagnosis not present

## 2019-03-03 DIAGNOSIS — I10 Essential (primary) hypertension: Secondary | ICD-10-CM | POA: Diagnosis not present

## 2019-03-03 DIAGNOSIS — I69354 Hemiplegia and hemiparesis following cerebral infarction affecting left non-dominant side: Secondary | ICD-10-CM | POA: Diagnosis not present

## 2019-03-03 DIAGNOSIS — Z993 Dependence on wheelchair: Secondary | ICD-10-CM | POA: Diagnosis not present

## 2019-03-03 DIAGNOSIS — Z9181 History of falling: Secondary | ICD-10-CM | POA: Diagnosis not present

## 2019-03-03 DIAGNOSIS — M545 Low back pain: Secondary | ICD-10-CM | POA: Diagnosis not present

## 2019-03-04 NOTE — Telephone Encounter (Signed)
Radiology Appt has been sch for 03/13/2019.

## 2019-03-05 DIAGNOSIS — I10 Essential (primary) hypertension: Secondary | ICD-10-CM | POA: Diagnosis not present

## 2019-03-05 DIAGNOSIS — M545 Low back pain: Secondary | ICD-10-CM | POA: Diagnosis not present

## 2019-03-05 DIAGNOSIS — R1310 Dysphagia, unspecified: Secondary | ICD-10-CM | POA: Diagnosis not present

## 2019-03-05 DIAGNOSIS — I69354 Hemiplegia and hemiparesis following cerebral infarction affecting left non-dominant side: Secondary | ICD-10-CM | POA: Diagnosis not present

## 2019-03-05 DIAGNOSIS — D329 Benign neoplasm of meninges, unspecified: Secondary | ICD-10-CM | POA: Diagnosis not present

## 2019-03-05 DIAGNOSIS — E785 Hyperlipidemia, unspecified: Secondary | ICD-10-CM | POA: Diagnosis not present

## 2019-03-05 DIAGNOSIS — I739 Peripheral vascular disease, unspecified: Secondary | ICD-10-CM | POA: Diagnosis not present

## 2019-03-05 DIAGNOSIS — Z993 Dependence on wheelchair: Secondary | ICD-10-CM | POA: Diagnosis not present

## 2019-03-05 DIAGNOSIS — R7303 Prediabetes: Secondary | ICD-10-CM | POA: Diagnosis not present

## 2019-03-05 DIAGNOSIS — Z9181 History of falling: Secondary | ICD-10-CM | POA: Diagnosis not present

## 2019-03-06 DIAGNOSIS — I739 Peripheral vascular disease, unspecified: Secondary | ICD-10-CM | POA: Diagnosis not present

## 2019-03-06 DIAGNOSIS — I10 Essential (primary) hypertension: Secondary | ICD-10-CM | POA: Diagnosis not present

## 2019-03-06 DIAGNOSIS — R1310 Dysphagia, unspecified: Secondary | ICD-10-CM | POA: Diagnosis not present

## 2019-03-06 DIAGNOSIS — M545 Low back pain: Secondary | ICD-10-CM | POA: Diagnosis not present

## 2019-03-06 DIAGNOSIS — I69354 Hemiplegia and hemiparesis following cerebral infarction affecting left non-dominant side: Secondary | ICD-10-CM | POA: Diagnosis not present

## 2019-03-06 DIAGNOSIS — D329 Benign neoplasm of meninges, unspecified: Secondary | ICD-10-CM | POA: Diagnosis not present

## 2019-03-06 DIAGNOSIS — Z993 Dependence on wheelchair: Secondary | ICD-10-CM | POA: Diagnosis not present

## 2019-03-06 DIAGNOSIS — Z9181 History of falling: Secondary | ICD-10-CM | POA: Diagnosis not present

## 2019-03-06 DIAGNOSIS — E785 Hyperlipidemia, unspecified: Secondary | ICD-10-CM | POA: Diagnosis not present

## 2019-03-06 DIAGNOSIS — R7303 Prediabetes: Secondary | ICD-10-CM | POA: Diagnosis not present

## 2019-03-09 DIAGNOSIS — E785 Hyperlipidemia, unspecified: Secondary | ICD-10-CM | POA: Diagnosis not present

## 2019-03-09 DIAGNOSIS — Z9181 History of falling: Secondary | ICD-10-CM | POA: Diagnosis not present

## 2019-03-09 DIAGNOSIS — R1310 Dysphagia, unspecified: Secondary | ICD-10-CM | POA: Diagnosis not present

## 2019-03-09 DIAGNOSIS — R7303 Prediabetes: Secondary | ICD-10-CM | POA: Diagnosis not present

## 2019-03-09 DIAGNOSIS — M545 Low back pain: Secondary | ICD-10-CM | POA: Diagnosis not present

## 2019-03-09 DIAGNOSIS — Z993 Dependence on wheelchair: Secondary | ICD-10-CM | POA: Diagnosis not present

## 2019-03-09 DIAGNOSIS — I10 Essential (primary) hypertension: Secondary | ICD-10-CM | POA: Diagnosis not present

## 2019-03-09 DIAGNOSIS — I739 Peripheral vascular disease, unspecified: Secondary | ICD-10-CM | POA: Diagnosis not present

## 2019-03-09 DIAGNOSIS — I69354 Hemiplegia and hemiparesis following cerebral infarction affecting left non-dominant side: Secondary | ICD-10-CM | POA: Diagnosis not present

## 2019-03-09 DIAGNOSIS — D329 Benign neoplasm of meninges, unspecified: Secondary | ICD-10-CM | POA: Diagnosis not present

## 2019-03-10 DIAGNOSIS — E785 Hyperlipidemia, unspecified: Secondary | ICD-10-CM | POA: Diagnosis not present

## 2019-03-10 DIAGNOSIS — M545 Low back pain: Secondary | ICD-10-CM | POA: Diagnosis not present

## 2019-03-10 DIAGNOSIS — R1310 Dysphagia, unspecified: Secondary | ICD-10-CM | POA: Diagnosis not present

## 2019-03-10 DIAGNOSIS — I69354 Hemiplegia and hemiparesis following cerebral infarction affecting left non-dominant side: Secondary | ICD-10-CM | POA: Diagnosis not present

## 2019-03-10 DIAGNOSIS — I10 Essential (primary) hypertension: Secondary | ICD-10-CM | POA: Diagnosis not present

## 2019-03-10 DIAGNOSIS — Z9181 History of falling: Secondary | ICD-10-CM | POA: Diagnosis not present

## 2019-03-10 DIAGNOSIS — D329 Benign neoplasm of meninges, unspecified: Secondary | ICD-10-CM | POA: Diagnosis not present

## 2019-03-10 DIAGNOSIS — Z993 Dependence on wheelchair: Secondary | ICD-10-CM | POA: Diagnosis not present

## 2019-03-10 DIAGNOSIS — I739 Peripheral vascular disease, unspecified: Secondary | ICD-10-CM | POA: Diagnosis not present

## 2019-03-10 DIAGNOSIS — R7303 Prediabetes: Secondary | ICD-10-CM | POA: Diagnosis not present

## 2019-03-11 ENCOUNTER — Telehealth: Payer: Self-pay

## 2019-03-11 DIAGNOSIS — R1310 Dysphagia, unspecified: Secondary | ICD-10-CM | POA: Diagnosis not present

## 2019-03-11 DIAGNOSIS — I739 Peripheral vascular disease, unspecified: Secondary | ICD-10-CM | POA: Diagnosis not present

## 2019-03-11 DIAGNOSIS — D329 Benign neoplasm of meninges, unspecified: Secondary | ICD-10-CM | POA: Diagnosis not present

## 2019-03-11 DIAGNOSIS — I10 Essential (primary) hypertension: Secondary | ICD-10-CM | POA: Diagnosis not present

## 2019-03-11 DIAGNOSIS — M545 Low back pain: Secondary | ICD-10-CM | POA: Diagnosis not present

## 2019-03-11 DIAGNOSIS — I69354 Hemiplegia and hemiparesis following cerebral infarction affecting left non-dominant side: Secondary | ICD-10-CM | POA: Diagnosis not present

## 2019-03-11 DIAGNOSIS — R7303 Prediabetes: Secondary | ICD-10-CM | POA: Diagnosis not present

## 2019-03-11 DIAGNOSIS — E785 Hyperlipidemia, unspecified: Secondary | ICD-10-CM | POA: Diagnosis not present

## 2019-03-11 DIAGNOSIS — Z9181 History of falling: Secondary | ICD-10-CM | POA: Diagnosis not present

## 2019-03-11 DIAGNOSIS — Z993 Dependence on wheelchair: Secondary | ICD-10-CM | POA: Diagnosis not present

## 2019-03-11 NOTE — Telephone Encounter (Signed)
Stephen [name] with [facility] requesting additional order for PT. Please call back.

## 2019-03-11 NOTE — Telephone Encounter (Signed)
VO for continued [facility] PT services  2x week for 4 weeks for strengthening, balance, safety VO given, do you agree?

## 2019-03-12 ENCOUNTER — Telehealth: Payer: Self-pay

## 2019-03-12 DIAGNOSIS — Z9181 History of falling: Secondary | ICD-10-CM | POA: Diagnosis not present

## 2019-03-12 DIAGNOSIS — D329 Benign neoplasm of meninges, unspecified: Secondary | ICD-10-CM | POA: Diagnosis not present

## 2019-03-12 DIAGNOSIS — I69354 Hemiplegia and hemiparesis following cerebral infarction affecting left non-dominant side: Secondary | ICD-10-CM | POA: Diagnosis not present

## 2019-03-12 DIAGNOSIS — E785 Hyperlipidemia, unspecified: Secondary | ICD-10-CM | POA: Diagnosis not present

## 2019-03-12 DIAGNOSIS — R2689 Other abnormalities of gait and mobility: Secondary | ICD-10-CM | POA: Diagnosis not present

## 2019-03-12 DIAGNOSIS — Z993 Dependence on wheelchair: Secondary | ICD-10-CM | POA: Diagnosis not present

## 2019-03-12 DIAGNOSIS — I10 Essential (primary) hypertension: Secondary | ICD-10-CM | POA: Diagnosis not present

## 2019-03-12 DIAGNOSIS — M545 Low back pain: Secondary | ICD-10-CM | POA: Diagnosis not present

## 2019-03-12 DIAGNOSIS — M6281 Muscle weakness (generalized): Secondary | ICD-10-CM | POA: Diagnosis not present

## 2019-03-12 DIAGNOSIS — R1310 Dysphagia, unspecified: Secondary | ICD-10-CM | POA: Diagnosis not present

## 2019-03-12 DIAGNOSIS — I739 Peripheral vascular disease, unspecified: Secondary | ICD-10-CM | POA: Diagnosis not present

## 2019-03-12 DIAGNOSIS — Z741 Need for assistance with personal care: Secondary | ICD-10-CM | POA: Diagnosis not present

## 2019-03-12 DIAGNOSIS — R7303 Prediabetes: Secondary | ICD-10-CM | POA: Diagnosis not present

## 2019-03-12 DIAGNOSIS — I639 Cerebral infarction, unspecified: Secondary | ICD-10-CM | POA: Diagnosis not present

## 2019-03-12 NOTE — Telephone Encounter (Signed)
Received TC from [place] with [facility] requesting VO to extend OT:  1X/week X 2 weeks 2X/week for week 2 1X/week for week 3 Improve motor skills  VO given.  Will forward to Dr. Darrick Meigs for agreement or denial. Thank you, SChaplin, RN,BSN

## 2019-03-12 NOTE — Telephone Encounter (Signed)
Opened in error.   

## 2019-03-13 ENCOUNTER — Ambulatory Visit (HOSPITAL_COMMUNITY): Payer: Medicare Other | Attending: Internal Medicine

## 2019-03-16 DIAGNOSIS — I69354 Hemiplegia and hemiparesis following cerebral infarction affecting left non-dominant side: Secondary | ICD-10-CM | POA: Diagnosis not present

## 2019-03-16 DIAGNOSIS — D329 Benign neoplasm of meninges, unspecified: Secondary | ICD-10-CM | POA: Diagnosis not present

## 2019-03-16 DIAGNOSIS — I10 Essential (primary) hypertension: Secondary | ICD-10-CM | POA: Diagnosis not present

## 2019-03-16 DIAGNOSIS — E785 Hyperlipidemia, unspecified: Secondary | ICD-10-CM | POA: Diagnosis not present

## 2019-03-16 DIAGNOSIS — M545 Low back pain: Secondary | ICD-10-CM | POA: Diagnosis not present

## 2019-03-16 DIAGNOSIS — Z993 Dependence on wheelchair: Secondary | ICD-10-CM | POA: Diagnosis not present

## 2019-03-16 DIAGNOSIS — I739 Peripheral vascular disease, unspecified: Secondary | ICD-10-CM | POA: Diagnosis not present

## 2019-03-16 DIAGNOSIS — R7303 Prediabetes: Secondary | ICD-10-CM | POA: Diagnosis not present

## 2019-03-16 DIAGNOSIS — Z9181 History of falling: Secondary | ICD-10-CM | POA: Diagnosis not present

## 2019-03-16 DIAGNOSIS — R1310 Dysphagia, unspecified: Secondary | ICD-10-CM | POA: Diagnosis not present

## 2019-03-16 NOTE — Telephone Encounter (Signed)
I agree

## 2019-03-17 ENCOUNTER — Ambulatory Visit (INDEPENDENT_AMBULATORY_CARE_PROVIDER_SITE_OTHER): Payer: Medicare Other | Admitting: Internal Medicine

## 2019-03-17 ENCOUNTER — Other Ambulatory Visit: Payer: Self-pay

## 2019-03-17 ENCOUNTER — Encounter: Payer: Self-pay | Admitting: Internal Medicine

## 2019-03-17 DIAGNOSIS — Z79899 Other long term (current) drug therapy: Secondary | ICD-10-CM | POA: Diagnosis not present

## 2019-03-17 DIAGNOSIS — I69398 Other sequelae of cerebral infarction: Secondary | ICD-10-CM

## 2019-03-17 DIAGNOSIS — K219 Gastro-esophageal reflux disease without esophagitis: Secondary | ICD-10-CM

## 2019-03-17 DIAGNOSIS — Z7902 Long term (current) use of antithrombotics/antiplatelets: Secondary | ICD-10-CM

## 2019-03-17 DIAGNOSIS — R911 Solitary pulmonary nodule: Secondary | ICD-10-CM

## 2019-03-17 DIAGNOSIS — G629 Polyneuropathy, unspecified: Secondary | ICD-10-CM

## 2019-03-17 DIAGNOSIS — I739 Peripheral vascular disease, unspecified: Secondary | ICD-10-CM

## 2019-03-17 MED ORDER — PANTOPRAZOLE SODIUM 20 MG PO TBEC: DELAYED_RELEASE_TABLET | ORAL | 3 refills | Status: DC

## 2019-03-17 MED ORDER — CLOPIDOGREL BISULFATE 75 MG PO TABS: ORAL_TABLET | ORAL | 3 refills | Status: DC

## 2019-03-17 MED ORDER — ATORVASTATIN CALCIUM 80 MG PO TABS: ORAL_TABLET | ORAL | 4 refills | Status: DC

## 2019-03-17 MED ORDER — HYDROCHLOROTHIAZIDE 12.5 MG PO CAPS: 25.0000 mg | ORAL_CAPSULE | Freq: Every day | ORAL | 3 refills | Status: DC

## 2019-03-17 MED ORDER — AMLODIPINE BESYLATE 10 MG PO TABS: 10.0000 mg | ORAL_TABLET | Freq: Every day | ORAL | 3 refills | Status: DC

## 2019-03-17 NOTE — Patient Instructions (Signed)
I'm glad to see you are doing so much better. I would like to see you back in the office about one week after you get that CT done. For your foot pain, I would like you to try buying some capsasin cream and lidocaine cream. Mix them together and put it on your foot. We can chat about how it is working next time I see you.

## 2019-03-17 NOTE — Progress Notes (Signed)
   CC: stroke, lung nodule  HPI:  [name] is a 69 y.o. male who presents for 2w follow up for stroke and lung nodule.  Please see problem based assessment and plan for additional details.     Past Medical History:  Diagnosis Date  . Alcohol abuse   . Arthropathy    shoulder  . Chest pain   . Chronic cough   . Foot pain    bilateral  . Hyperlipidemia   . Hypertension   . Intermittent claudication (HCC)    bilateral  . Latent tuberculosis   . Lumbago   . Shoulder pain     Review of Systems:  Review of Systems - General ROS: negative for - chills or fever Respiratory ROS: negative for - pleuritic pain or sputum changes Musculoskeletal ROS: positive for - muscular weakness Neurological ROS: positive for - numbness/tingling   Physical Exam:  Vitals:   03/17/19 1319  BP: (!) 108/95  Pulse: 87  Temp: 98.4 F (36.9 C)  TempSrc: Oral  SpO2: 100%    GENERAL: chronically ill appearing but in no acute distress CARDIAC: heart regular rate and rhythm, no peripheral edema appreciated PULMONARY: lung sounds clear to auscultation SKIN: no wounds on his feet. Skin appears dry. NEURO: impaired sensation of his bilateral feet; L>R.    Assessment & Plan:   Lung nodule.  CXR obtained at last visit showed a 1cmx0.8cm anterior mediastinal lung nodule with possible retraction in the right perihilar region. Chest CT was ordered at that time however has not been scheduled yet for an unknown reason. We discussed the importance of getting this done and they agreed to reschedule. Appt set for 03/25/19.  Plan They agree to follow up with me the following week to discuss those results.  CVA.  He also presents today for follow up on his residual motor deficits related to his recent CVA. His deficits are debilitating and he was requiring 24h care to assist with his ADLs. At last visit, it was found that his ex-spouse was caring for him at home however she did not plan on doing this  for much longer. We had discussed the need for further discussion between them as to how long she planned on staying so that we could reassess his needs at home.  Assessment: today, he appears to be doing better and his relationship with his caregiver appears improved. She notes that the home health folks are helping the situation a lot and that he appears to be getting stronger. Plan Encouraged him to follow up with neurology Continue plavix--refill sent Continue high intensity statin Continue tight blood pressure control   Peripheral neuropathy Medications: gabapentin 400mg  tid He notes worsening symptoms involving his left foot. On exam, there are no apparent wounds however his symptoms are made worse by me even lightly touching his foot. Assessment: we discussed that this is likely a progression of his neuropathy. Plan Will have him trial capsacin and lidocaine mixture and will reassess at next visit.  Patient is in agreement with the plan and endorses no further questions at this time.  Patient discussed with Dr. Elwanda [place], MD Internal Medicine Resident-PGY1 03/17/19

## 2019-03-18 DIAGNOSIS — E785 Hyperlipidemia, unspecified: Secondary | ICD-10-CM | POA: Diagnosis not present

## 2019-03-18 DIAGNOSIS — I69354 Hemiplegia and hemiparesis following cerebral infarction affecting left non-dominant side: Secondary | ICD-10-CM | POA: Diagnosis not present

## 2019-03-18 DIAGNOSIS — Z993 Dependence on wheelchair: Secondary | ICD-10-CM | POA: Diagnosis not present

## 2019-03-18 DIAGNOSIS — R7303 Prediabetes: Secondary | ICD-10-CM | POA: Diagnosis not present

## 2019-03-18 DIAGNOSIS — D329 Benign neoplasm of meninges, unspecified: Secondary | ICD-10-CM | POA: Diagnosis not present

## 2019-03-18 DIAGNOSIS — Z9181 History of falling: Secondary | ICD-10-CM | POA: Diagnosis not present

## 2019-03-18 DIAGNOSIS — I739 Peripheral vascular disease, unspecified: Secondary | ICD-10-CM | POA: Diagnosis not present

## 2019-03-18 DIAGNOSIS — I10 Essential (primary) hypertension: Secondary | ICD-10-CM | POA: Diagnosis not present

## 2019-03-18 DIAGNOSIS — M545 Low back pain: Secondary | ICD-10-CM | POA: Diagnosis not present

## 2019-03-18 DIAGNOSIS — R1310 Dysphagia, unspecified: Secondary | ICD-10-CM | POA: Diagnosis not present

## 2019-03-18 NOTE — Progress Notes (Signed)
Internal Medicine Clinic Attending  Case discussed with Dr. Christian at the time of the visit.  We reviewed the resident's history and exam and pertinent patient test results.  I agree with the assessment, diagnosis, and plan of care documented in the resident's note.    

## 2019-03-25 ENCOUNTER — Ambulatory Visit (HOSPITAL_COMMUNITY): Payer: Medicare Other

## 2019-03-30 DIAGNOSIS — R1310 Dysphagia, unspecified: Secondary | ICD-10-CM | POA: Diagnosis not present

## 2019-03-30 DIAGNOSIS — Z9181 History of falling: Secondary | ICD-10-CM | POA: Diagnosis not present

## 2019-03-30 DIAGNOSIS — Z993 Dependence on wheelchair: Secondary | ICD-10-CM | POA: Diagnosis not present

## 2019-03-30 DIAGNOSIS — I69354 Hemiplegia and hemiparesis following cerebral infarction affecting left non-dominant side: Secondary | ICD-10-CM | POA: Diagnosis not present

## 2019-03-30 DIAGNOSIS — M545 Low back pain: Secondary | ICD-10-CM | POA: Diagnosis not present

## 2019-03-30 DIAGNOSIS — E785 Hyperlipidemia, unspecified: Secondary | ICD-10-CM | POA: Diagnosis not present

## 2019-03-30 DIAGNOSIS — R7303 Prediabetes: Secondary | ICD-10-CM | POA: Diagnosis not present

## 2019-03-30 DIAGNOSIS — I10 Essential (primary) hypertension: Secondary | ICD-10-CM | POA: Diagnosis not present

## 2019-03-30 DIAGNOSIS — D329 Benign neoplasm of meninges, unspecified: Secondary | ICD-10-CM | POA: Diagnosis not present

## 2019-03-30 DIAGNOSIS — I739 Peripheral vascular disease, unspecified: Secondary | ICD-10-CM | POA: Diagnosis not present

## 2019-04-01 DIAGNOSIS — I69354 Hemiplegia and hemiparesis following cerebral infarction affecting left non-dominant side: Secondary | ICD-10-CM | POA: Diagnosis not present

## 2019-04-01 DIAGNOSIS — I739 Peripheral vascular disease, unspecified: Secondary | ICD-10-CM | POA: Diagnosis not present

## 2019-04-01 DIAGNOSIS — Z993 Dependence on wheelchair: Secondary | ICD-10-CM | POA: Diagnosis not present

## 2019-04-01 DIAGNOSIS — E785 Hyperlipidemia, unspecified: Secondary | ICD-10-CM | POA: Diagnosis not present

## 2019-04-01 DIAGNOSIS — M545 Low back pain: Secondary | ICD-10-CM | POA: Diagnosis not present

## 2019-04-01 DIAGNOSIS — Z9181 History of falling: Secondary | ICD-10-CM | POA: Diagnosis not present

## 2019-04-01 DIAGNOSIS — D329 Benign neoplasm of meninges, unspecified: Secondary | ICD-10-CM | POA: Diagnosis not present

## 2019-04-01 DIAGNOSIS — R7303 Prediabetes: Secondary | ICD-10-CM | POA: Diagnosis not present

## 2019-04-01 DIAGNOSIS — I10 Essential (primary) hypertension: Secondary | ICD-10-CM | POA: Diagnosis not present

## 2019-04-01 DIAGNOSIS — R1310 Dysphagia, unspecified: Secondary | ICD-10-CM | POA: Diagnosis not present

## 2019-04-02 ENCOUNTER — Encounter: Payer: Medicare Other | Admitting: Internal Medicine

## 2019-04-03 DIAGNOSIS — R7303 Prediabetes: Secondary | ICD-10-CM | POA: Diagnosis not present

## 2019-04-03 DIAGNOSIS — R1310 Dysphagia, unspecified: Secondary | ICD-10-CM | POA: Diagnosis not present

## 2019-04-03 DIAGNOSIS — Z9181 History of falling: Secondary | ICD-10-CM | POA: Diagnosis not present

## 2019-04-03 DIAGNOSIS — I739 Peripheral vascular disease, unspecified: Secondary | ICD-10-CM | POA: Diagnosis not present

## 2019-04-03 DIAGNOSIS — I10 Essential (primary) hypertension: Secondary | ICD-10-CM | POA: Diagnosis not present

## 2019-04-03 DIAGNOSIS — D329 Benign neoplasm of meninges, unspecified: Secondary | ICD-10-CM | POA: Diagnosis not present

## 2019-04-03 DIAGNOSIS — Z993 Dependence on wheelchair: Secondary | ICD-10-CM | POA: Diagnosis not present

## 2019-04-03 DIAGNOSIS — I69354 Hemiplegia and hemiparesis following cerebral infarction affecting left non-dominant side: Secondary | ICD-10-CM | POA: Diagnosis not present

## 2019-04-03 DIAGNOSIS — M545 Low back pain: Secondary | ICD-10-CM | POA: Diagnosis not present

## 2019-04-03 DIAGNOSIS — E785 Hyperlipidemia, unspecified: Secondary | ICD-10-CM | POA: Diagnosis not present

## 2019-04-06 DIAGNOSIS — M545 Low back pain: Secondary | ICD-10-CM | POA: Diagnosis not present

## 2019-04-06 DIAGNOSIS — D329 Benign neoplasm of meninges, unspecified: Secondary | ICD-10-CM | POA: Diagnosis not present

## 2019-04-06 DIAGNOSIS — E785 Hyperlipidemia, unspecified: Secondary | ICD-10-CM | POA: Diagnosis not present

## 2019-04-06 DIAGNOSIS — Z993 Dependence on wheelchair: Secondary | ICD-10-CM | POA: Diagnosis not present

## 2019-04-06 DIAGNOSIS — I69354 Hemiplegia and hemiparesis following cerebral infarction affecting left non-dominant side: Secondary | ICD-10-CM | POA: Diagnosis not present

## 2019-04-06 DIAGNOSIS — I10 Essential (primary) hypertension: Secondary | ICD-10-CM | POA: Diagnosis not present

## 2019-04-06 DIAGNOSIS — R1310 Dysphagia, unspecified: Secondary | ICD-10-CM | POA: Diagnosis not present

## 2019-04-06 DIAGNOSIS — R7303 Prediabetes: Secondary | ICD-10-CM | POA: Diagnosis not present

## 2019-04-06 DIAGNOSIS — Z9181 History of falling: Secondary | ICD-10-CM | POA: Diagnosis not present

## 2019-04-06 DIAGNOSIS — I739 Peripheral vascular disease, unspecified: Secondary | ICD-10-CM | POA: Diagnosis not present

## 2019-04-07 DIAGNOSIS — I739 Peripheral vascular disease, unspecified: Secondary | ICD-10-CM | POA: Diagnosis not present

## 2019-04-07 DIAGNOSIS — E785 Hyperlipidemia, unspecified: Secondary | ICD-10-CM | POA: Diagnosis not present

## 2019-04-07 DIAGNOSIS — R1310 Dysphagia, unspecified: Secondary | ICD-10-CM | POA: Diagnosis not present

## 2019-04-07 DIAGNOSIS — Z9181 History of falling: Secondary | ICD-10-CM | POA: Diagnosis not present

## 2019-04-07 DIAGNOSIS — I10 Essential (primary) hypertension: Secondary | ICD-10-CM | POA: Diagnosis not present

## 2019-04-07 DIAGNOSIS — Z993 Dependence on wheelchair: Secondary | ICD-10-CM | POA: Diagnosis not present

## 2019-04-07 DIAGNOSIS — R7303 Prediabetes: Secondary | ICD-10-CM | POA: Diagnosis not present

## 2019-04-07 DIAGNOSIS — M545 Low back pain: Secondary | ICD-10-CM | POA: Diagnosis not present

## 2019-04-07 DIAGNOSIS — D329 Benign neoplasm of meninges, unspecified: Secondary | ICD-10-CM | POA: Diagnosis not present

## 2019-04-07 DIAGNOSIS — I69354 Hemiplegia and hemiparesis following cerebral infarction affecting left non-dominant side: Secondary | ICD-10-CM | POA: Diagnosis not present

## 2019-04-08 DIAGNOSIS — R7303 Prediabetes: Secondary | ICD-10-CM | POA: Diagnosis not present

## 2019-04-08 DIAGNOSIS — I69354 Hemiplegia and hemiparesis following cerebral infarction affecting left non-dominant side: Secondary | ICD-10-CM | POA: Diagnosis not present

## 2019-04-08 DIAGNOSIS — D329 Benign neoplasm of meninges, unspecified: Secondary | ICD-10-CM | POA: Diagnosis not present

## 2019-04-08 DIAGNOSIS — Z9181 History of falling: Secondary | ICD-10-CM | POA: Diagnosis not present

## 2019-04-08 DIAGNOSIS — I739 Peripheral vascular disease, unspecified: Secondary | ICD-10-CM | POA: Diagnosis not present

## 2019-04-08 DIAGNOSIS — E785 Hyperlipidemia, unspecified: Secondary | ICD-10-CM | POA: Diagnosis not present

## 2019-04-08 DIAGNOSIS — I10 Essential (primary) hypertension: Secondary | ICD-10-CM | POA: Diagnosis not present

## 2019-04-08 DIAGNOSIS — R1310 Dysphagia, unspecified: Secondary | ICD-10-CM | POA: Diagnosis not present

## 2019-04-08 DIAGNOSIS — M545 Low back pain: Secondary | ICD-10-CM | POA: Diagnosis not present

## 2019-04-08 DIAGNOSIS — Z993 Dependence on wheelchair: Secondary | ICD-10-CM | POA: Diagnosis not present

## 2019-04-09 DIAGNOSIS — R2689 Other abnormalities of gait and mobility: Secondary | ICD-10-CM | POA: Diagnosis not present

## 2019-04-09 DIAGNOSIS — I639 Cerebral infarction, unspecified: Secondary | ICD-10-CM | POA: Diagnosis not present

## 2019-04-09 DIAGNOSIS — Z741 Need for assistance with personal care: Secondary | ICD-10-CM | POA: Diagnosis not present

## 2019-04-09 DIAGNOSIS — M6281 Muscle weakness (generalized): Secondary | ICD-10-CM | POA: Diagnosis not present

## 2019-04-22 ENCOUNTER — Ambulatory Visit (HOSPITAL_COMMUNITY)
Admission: RE | Admit: 2019-04-22 | Discharge: 2019-04-22 | Disposition: A | Discharge status: Home / Self Care | Payer: Medicare Other | Source: Ambulatory Visit | Attending: Internal Medicine | Admitting: Internal Medicine

## 2019-04-22 ENCOUNTER — Other Ambulatory Visit: Payer: Self-pay

## 2019-04-22 DIAGNOSIS — R911 Solitary pulmonary nodule: Secondary | ICD-10-CM | POA: Diagnosis not present

## 2019-04-22 LAB — POCT I-STAT CREATININE: Creatinine, Ser: 1.1 mg/dL (ref 0.61–1.24)

## 2019-04-22 MED ORDER — IOHEXOL 300 MG/ML  SOLN
75.0000 mL | Freq: Once | INTRAMUSCULAR | Status: AC | PRN
  Administered 2019-04-22: 75 mL via INTRAVENOUS

## 2019-05-10 DIAGNOSIS — M6281 Muscle weakness (generalized): Secondary | ICD-10-CM | POA: Diagnosis not present

## 2019-05-10 DIAGNOSIS — R2689 Other abnormalities of gait and mobility: Secondary | ICD-10-CM | POA: Diagnosis not present

## 2019-05-10 DIAGNOSIS — I639 Cerebral infarction, unspecified: Secondary | ICD-10-CM | POA: Diagnosis not present

## 2019-05-10 DIAGNOSIS — Z741 Need for assistance with personal care: Secondary | ICD-10-CM | POA: Diagnosis not present

## 2019-06-09 DIAGNOSIS — M6281 Muscle weakness (generalized): Secondary | ICD-10-CM | POA: Diagnosis not present

## 2019-06-09 DIAGNOSIS — Z741 Need for assistance with personal care: Secondary | ICD-10-CM | POA: Diagnosis not present

## 2019-06-09 DIAGNOSIS — R2689 Other abnormalities of gait and mobility: Secondary | ICD-10-CM | POA: Diagnosis not present

## 2019-06-09 DIAGNOSIS — I639 Cerebral infarction, unspecified: Secondary | ICD-10-CM | POA: Diagnosis not present

## 2019-07-02 ENCOUNTER — Other Ambulatory Visit: Payer: Self-pay

## 2019-07-02 ENCOUNTER — Emergency Department (HOSPITAL_COMMUNITY): Payer: Medicare Other

## 2019-07-02 ENCOUNTER — Ambulatory Visit: Payer: Medicare Other | Admitting: Internal Medicine

## 2019-07-02 ENCOUNTER — Encounter (HOSPITAL_COMMUNITY): Payer: Self-pay

## 2019-07-02 ENCOUNTER — Emergency Department (HOSPITAL_COMMUNITY)
Admission: EM | Admit: 2019-07-02 | Discharge: 2019-07-02 | Discharge status: Against Medical Advice | Payer: Medicare Other | Attending: Emergency Medicine | Admitting: Emergency Medicine

## 2019-07-02 ENCOUNTER — Ambulatory Visit (HOSPITAL_COMMUNITY)
Admit: 2019-07-02 | Discharge: 2019-07-02 | Disposition: A | Discharge status: Home / Self Care | Payer: Medicare Other | Attending: Internal Medicine | Admitting: Internal Medicine

## 2019-07-02 DIAGNOSIS — R10A0 Flank pain, unspecified side: Secondary | ICD-10-CM

## 2019-07-02 DIAGNOSIS — R0602 Shortness of breath: Secondary | ICD-10-CM

## 2019-07-02 DIAGNOSIS — R7303 Prediabetes: Secondary | ICD-10-CM

## 2019-07-02 DIAGNOSIS — R109 Unspecified abdominal pain: Secondary | ICD-10-CM

## 2019-07-02 DIAGNOSIS — Z5321 Procedure and treatment not carried out due to patient leaving prior to being seen by health care provider: Secondary | ICD-10-CM | POA: Diagnosis not present

## 2019-07-02 DIAGNOSIS — R911 Solitary pulmonary nodule: Secondary | ICD-10-CM

## 2019-07-02 DIAGNOSIS — R0789 Other chest pain: Secondary | ICD-10-CM | POA: Diagnosis not present

## 2019-07-02 DIAGNOSIS — R079 Chest pain, unspecified: Secondary | ICD-10-CM | POA: Diagnosis not present

## 2019-07-02 DIAGNOSIS — I10 Essential (primary) hypertension: Secondary | ICD-10-CM

## 2019-07-02 LAB — TROPONIN I (HIGH SENSITIVITY): Troponin I (High Sensitivity): 7 ng/L (ref <18)

## 2019-07-02 LAB — BASIC METABOLIC PANEL
Anion gap: 12 (ref 5–15)
BUN: 7 mg/dL — ABNORMAL LOW (ref 8–23)
CO2: 26 mmol/L (ref 22–32)
Calcium: 9.4 mg/dL (ref 8.9–10.3)
Chloride: 100 mmol/L (ref 98–111)
Creatinine, Ser: 1.07 mg/dL (ref 0.61–1.24)
GFR calc Af Amer: >60 mL/min (ref >60)
GFR calc non Af Amer: >60 mL/min (ref >60)
Glucose, Bld: 93 mg/dL (ref 70–99)
Potassium: 2.6 mmol/L — CL (ref 3.5–5.1)
Sodium: 138 mmol/L (ref 135–145)

## 2019-07-02 LAB — CBC WITH DIFFERENTIAL/PLATELET
Abs Immature Granulocytes: 0.02 10*3/uL (ref 0.00–0.07)
Basophils Absolute: 0.1 10*3/uL (ref 0.0–0.1)
Basophils Relative: 1 %
Eosinophils Absolute: 0.3 10*3/uL (ref 0.0–0.5)
Eosinophils Relative: 3 %
HCT: 40.8 % (ref 39.0–52.0)
Hemoglobin: 13.5 g/dL (ref 13.0–17.0)
Immature Granulocytes: 0 %
Lymphocytes Relative: 40 %
Lymphs Abs: 3.4 10*3/uL (ref 0.7–4.0)
MCH: 28.2 pg (ref 26.0–34.0)
MCHC: 33.1 g/dL (ref 30.0–36.0)
MCV: 85.4 fL (ref 80.0–100.0)
Monocytes Absolute: 0.8 10*3/uL (ref 0.1–1.0)
Monocytes Relative: 9 %
Neutro Abs: 4 10*3/uL (ref 1.7–7.7)
Neutrophils Relative %: 47 %
Platelets: 248 10*3/uL (ref 150–400)
RBC: 4.78 MIL/uL (ref 4.22–5.81)
RDW: 17.2 % — ABNORMAL HIGH (ref 11.5–15.5)
WBC: 8.6 10*3/uL (ref 4.0–10.5)
nRBC: 0 % (ref 0.0–0.2)

## 2019-07-02 MED ORDER — SODIUM CHLORIDE 0.9% FLUSH: 3.0000 mL | Freq: Once | INTRAVENOUS | Status: DC

## 2019-07-02 NOTE — ED Triage Notes (Signed)
Pt arrives to ED w/ c/o left sided, 10/10 chest pain radiating down left arm. Pt endorses sob, denies N/V.

## 2019-07-02 NOTE — Progress Notes (Signed)
   CC: chest pain/shortness of breath/right flank pain  HPI:  [name] is a 69 y.o. male with severe vascular disease including history of 2 CVAs, PVD and hypertension.  He presented to the clinic this afternoon, accompanied by his significant other, for evaluation of 1w history of chest pain and shortness of breath.   Chest pain is primarily exertional however does occasionally have it at rest. It is located over the LSB. Unclear if it radiates down his arm or up his jaw. Non-pleuritic in nature. It is associated with shortness of breath without cough. He denies dizziness, lightheadedness. His significant other does imply worsened weakness over the past week as well.  He also notes right flank pain that is colicky in nature. Radiates down into his medial thigh. Denies dysuria.  Past Medical History:  Diagnosis Date  . Alcohol abuse   . Arthropathy    shoulder  . Chest pain   . Chronic cough   . Foot pain    bilateral  . Hyperlipidemia   . Hypertension   . Intermittent claudication (HCC)    bilateral  . Latent tuberculosis   . Lumbago   . Shoulder pain     Physical Exam: BP 130/65; HR 74; Temp 98.58F; Weight 153.7lb  GENERAL: chronically ill appearing in mild distress CARDIAC: heart regular rate and rhythm, no peripheral edema appreciated.  PULMONARY: using respiratory accessory muscles. lung sounds diminished at the right base ABDOMEN: bowel sounds active.    Assessment & Plan:   Chest pain/shortness of breath. Obtained EKG here in clinic which does show some repol abnormalities that are not significantly changed from prior EKG.  HEART score 5. Given his history clearly indicating fairly significant vascular disease and heart score, pt transported to ED for further evaluation. No further workup pursued in clinic. Pt and his wife are agreeable to further evaluation in ED.  Flank pain. Unclear etiology--MSK vs nephrolithiasis. Please obtain a UA for further  eval.  Patient is in agreement with the plan and endorses no further questions at this time.  Patient discussed with Dr. Venetia Maxon, MD Internal Medicine Resident-PGY1 07/02/19

## 2019-07-06 ENCOUNTER — Telehealth: Payer: Self-pay | Admitting: *Deleted

## 2019-07-06 NOTE — Telephone Encounter (Signed)
Call made to patient-unable to come today and was scheduled to be seen tomorrow in [facility].  Pt does not currently have chest pain, shortness of breath, or any other symptoms that warrants urgent care.  Will call 911 or go to ED should symptoms arise.  Pt agreed.Despina Hidden Cassady6/7/20211:40 PM

## 2019-07-06 NOTE — Telephone Encounter (Signed)
-----   Message from Mitzi Hansen, MD sent at 07/06/2019 11:15 AM EDT ----- Please have pt come back into [facility] for ACC appt and stat labs.  Ulis Rias, he is the one we sent to the ED last Thursday for chest pain however appears he left prior to further evaluation. I flagged both you and the front desk as I'm unsure if they can discuss his labs with him but he needs to know the potassium is dangerously low.  Thanks Mitzi Hansen, MD

## 2019-07-06 NOTE — Progress Notes (Signed)
Internal Medicine Clinic Attending  Case discussed with Dr. Darrick Meigs at the time of the visit.  We reviewed the resident's history and exam and pertinent patient test results.  I agree with the assessment, diagnosis, and plan of care documented in the resident's note.   It does not appear that patient stayed for ED evaluation. However did have labs drawn with critical potassium of 2.6. Will have patient return for follow up and repeat STAT labs.

## 2019-07-07 ENCOUNTER — Other Ambulatory Visit: Payer: Self-pay | Admitting: Internal Medicine

## 2019-07-07 ENCOUNTER — Ambulatory Visit (INDEPENDENT_AMBULATORY_CARE_PROVIDER_SITE_OTHER): Payer: Medicare Other | Admitting: Internal Medicine

## 2019-07-07 DIAGNOSIS — E876 Hypokalemia: Secondary | ICD-10-CM

## 2019-07-07 DIAGNOSIS — I10 Essential (primary) hypertension: Secondary | ICD-10-CM | POA: Diagnosis not present

## 2019-07-07 DIAGNOSIS — R079 Chest pain, unspecified: Secondary | ICD-10-CM | POA: Diagnosis not present

## 2019-07-07 LAB — BASIC METABOLIC PANEL
Anion gap: 10 (ref 5–15)
BUN: 16 mg/dL (ref 8–23)
CO2: 28 mmol/L (ref 22–32)
Calcium: 9.3 mg/dL (ref 8.9–10.3)
Chloride: 99 mmol/L (ref 98–111)
Creatinine, Ser: 1.02 mg/dL (ref 0.61–1.24)
GFR calc Af Amer: >60 mL/min (ref >60)
GFR calc non Af Amer: >60 mL/min (ref >60)
Glucose, Bld: 95 mg/dL (ref 70–99)
Potassium: 3.1 mmol/L — ABNORMAL LOW (ref 3.5–5.1)
Sodium: 137 mmol/L (ref 135–145)

## 2019-07-07 LAB — PHOSPHORUS: Phosphorus: 2.9 mg/dL (ref 2.5–4.6)

## 2019-07-07 LAB — MAGNESIUM: Magnesium: 2.1 mg/dL (ref 1.7–2.4)

## 2019-07-07 NOTE — Addendum Note (Signed)
Addended by: Truddie Crumble on: 07/07/2019 09:46 AM   Modules accepted: Orders

## 2019-07-07 NOTE — Progress Notes (Signed)
m °

## 2019-07-07 NOTE — Progress Notes (Signed)
   CC: Hypokalemia and chest pain follow up   HPI:  [name] is a 69 y.o. with a history listed below including CVA, PVD, and hypertension who was recently seen for chest pain and noted to have significant hypokalemia last week presenting for follow up and repeat labs.  Past Medical History:  Diagnosis Date  . Alcohol abuse   . Arthropathy    shoulder  . Chest pain   . Chronic cough   . Foot pain    bilateral  . Hyperlipidemia   . Hypertension   . Intermittent claudication (HCC)    bilateral  . Latent tuberculosis   . Lumbago   . Shoulder pain    Review of Systems:   Constitutional: Negative for chills and fever.  Respiratory: Negative for shortness of breath.   Cardiovascular: Negative for chest pain and leg swelling.  Gastrointestinal: Negative for abdominal pain, nausea and vomiting.  Neurological: Negative for dizziness and headaches.  Physical Exam:  Vitals:   07/07/19 1001  BP: 121/75  Pulse: 65  Temp: 97.7 F (36.5 C)  TempSrc: Oral  SpO2: 98%  Weight: 154 lb (69.9 kg)  Height: 5\' 6"  (1.676 m)   Physical Exam  Constitutional: He is oriented to person, place, and time and well-developed, well-nourished, and in no distress.  HENT:  Head: Normocephalic and atraumatic.  Cardiovascular: Normal rate, regular rhythm and normal heart sounds.  Pulmonary/Chest: Effort normal and breath sounds normal. No respiratory distress. He exhibits no tenderness.  Abdominal: Soft. Bowel sounds are normal. He exhibits no distension.  Musculoskeletal:     Cervical back: Normal range of motion and neck supple.  Neurological: He is alert and oriented to person, place, and time.  Skin: Skin is warm and dry.  Psychiatric: Mood and affect normal.    Assessment & Plan:   See Encounters Tab for problem based charting.  Patient discussed with Dr. Heber [place]

## 2019-07-07 NOTE — Patient Instructions (Signed)
[name],  It was a pleasure to see you today. Thank you for coming in.   Today we discussed your low potassium levels. In regards to this please stop taking the HCTZ medication, this could be what is causing the low potassium. Please follow up in 1 week to repeat labs and see if that helped.    We also discussed your chest pain. I am glad that you are feeling better from this standpoint. Please let us know if this occurs again.   Please return to clinic in 1-2 weeks or sooner if needed.   Thank you again for coming in.   [name].D.

## 2019-07-07 NOTE — Telephone Encounter (Signed)
Yes. Ill place a state bmp and magnesium level. Thanks

## 2019-07-08 NOTE — Assessment & Plan Note (Addendum)
Patient reports that he is no longer having any chest pain, he reports that he was having chest pain over the LSB, associated with some shortness of breath, was somewhat exertional, pleuritic.  Had an EKG last week that showed repolarization abnormalities. He reports that he went to the ER and had his vitals taken and was sent to the waiting room, he reports that he did not stay to get checked out.  Denies any chest pain at this time, still feeling so-so with some nausea however the chest pain has resolved. HEART score of 5. Could be MSK however can not rule out cardiac etiology. Could consider stress test in the future.

## 2019-07-08 NOTE — Assessment & Plan Note (Signed)
Patient is currently on hydrochlorothiazide and amlodipine for his BP. Denies any issues taking his medications. BP today is well controlled at 121/75. He was noted to have hypokalemia on his last visit, will hold HCTZ for now and repeat labs in 1 week.  -Hold HCTZ, if additional BP management is needed consider triamterene-HCTZ -Continue amlodipine 10 mg daily

## 2019-07-08 NOTE — Assessment & Plan Note (Addendum)
Patient was seen on 07/29/2019 and had some labs drawn, his potassium was down to 2.6 at that time.  He reports feeling "so-so" denies any vomiting, diarrhea, decreased appetite, abdominal pain, chest pain, shortness of breath, or other symptoms.  He currently is on hydrochlorothiazide, has been on it for years, appears to be since 2012.  On chart review it appears that he has had history of hypokalemia in the past, the hydrochlorothiazide could be contributing to this. He had been on K supplementation in the past as well. Advised patient to hold his hydrochlorothiazide for now.  Blood pressure is well controlled at 121/75.  -Hold hydrochlorothiazide -Repeat BMP today, consider K supplementation  -Return to clinic in 1 week to repeat labs and monitor blood pressure -If patient requires additional blood pressure medications could consider triamterene hydrochlorothiazide, will have less effect on the potassium -RTC in 1 week

## 2019-07-10 DIAGNOSIS — R2689 Other abnormalities of gait and mobility: Secondary | ICD-10-CM | POA: Diagnosis not present

## 2019-07-10 DIAGNOSIS — Z741 Need for assistance with personal care: Secondary | ICD-10-CM | POA: Diagnosis not present

## 2019-07-10 DIAGNOSIS — I639 Cerebral infarction, unspecified: Secondary | ICD-10-CM | POA: Diagnosis not present

## 2019-07-10 DIAGNOSIS — M6281 Muscle weakness (generalized): Secondary | ICD-10-CM | POA: Diagnosis not present

## 2019-07-10 NOTE — Progress Notes (Signed)
Internal Medicine Clinic Attending  Case discussed with Dr. Krienke at the time of the visit.  We reviewed the resident's history and exam and pertinent patient test results.  I agree with the assessment, diagnosis, and plan of care documented in the resident's note.    

## 2019-07-14 ENCOUNTER — Other Ambulatory Visit: Payer: Self-pay

## 2019-07-14 ENCOUNTER — Encounter: Payer: Self-pay | Admitting: Internal Medicine

## 2019-07-14 ENCOUNTER — Ambulatory Visit (INDEPENDENT_AMBULATORY_CARE_PROVIDER_SITE_OTHER): Payer: Medicare Other | Admitting: Internal Medicine

## 2019-07-14 ENCOUNTER — Telehealth: Payer: Self-pay | Admitting: Internal Medicine

## 2019-07-14 VITALS — BP 128/81 | HR 84 | Temp 98.1°F | Ht 66.0 in | Wt 153.8 lb

## 2019-07-14 DIAGNOSIS — E876 Hypokalemia: Secondary | ICD-10-CM

## 2019-07-14 LAB — BASIC METABOLIC PANEL
Anion gap: 12 (ref 5–15)
BUN: 18 mg/dL (ref 8–23)
CO2: 27 mmol/L (ref 22–32)
Calcium: 9.4 mg/dL (ref 8.9–10.3)
Chloride: 101 mmol/L (ref 98–111)
Creatinine, Ser: 1.1 mg/dL (ref 0.61–1.24)
GFR calc Af Amer: >60 mL/min (ref >60)
GFR calc non Af Amer: >60 mL/min (ref >60)
Glucose, Bld: 115 mg/dL — ABNORMAL HIGH (ref 70–99)
Potassium: 3.1 mmol/L — ABNORMAL LOW (ref 3.5–5.1)
Sodium: 140 mmol/L (ref 135–145)

## 2019-07-14 MED ORDER — TRIAMTERENE-HCTZ 37.5-25 MG PO CAPS: 1.0000 | ORAL_CAPSULE | Freq: Every day | ORAL | 2 refills | Status: DC

## 2019-07-14 NOTE — Assessment & Plan Note (Addendum)
The patient was last seen on 07/08/2019 with Dr. Sherry Ruffing and was found to be hypokalemic to 3.1.  His previous appointment for this was on 5/30 and his potassium that was 2.6.  Since his last visit, the patient states that he has been doing okay but feels like things are moving slow for him.  He notes being generally fatigued but feels about the same as he did at his last visit.  At his last visit, the patient was instructed to stop taking his home HCTZ due to his low potassium, however the patient and his wife state that he has continued to take it.  Plan: -BMP today -Stop HCTZ -Start triamterene-HCTZ 37.5-25 mg daily  -BMP in 1 week at next visit -Consider K+ supplementation if pt continues to be hypokalemic

## 2019-07-14 NOTE — Patient Instructions (Addendum)
Thank you for visiting [place] in clinic today.  Below is a summary of what we discussed:  1.  Low potassium -We tested your blood to measure your potassium level.  I will call you with the results of this test. -Stop taking the hydrochlorothiazide.  -Start taking Dyazide 1 tablet daily  2.  Follow-up -Please schedule an appointment to see [place] again in 1 week so we can check your potassium levels again.   If you have any questions or concerns in the meantime, please feel free to reach out to [place].

## 2019-07-14 NOTE — Telephone Encounter (Signed)
Call completed to Mr. Madani to inform him of the results of his BMP. After confirming his date of birth, pt was told his potassium was still low and that we would follow up next week to check it again after the medication changes we made today. Pt voiced understanding. Pt had no further questions.  Earlene Plater, MD Internal Medicine, PGY1 Pager: [phone]  07/14/2019,12:38 PM

## 2019-07-14 NOTE — Progress Notes (Signed)
   CC: Hypokalemia   HPI:  [name] is a 69 y.o. M with a hx as noted below who presents today for hypokalemia follow-up.  Please refer to the problem based charting for further details.  Past Medical History:  Diagnosis Date  . Alcohol abuse   . Arthropathy    shoulder  . Chest pain   . Chronic cough   . Foot pain    bilateral  . Hyperlipidemia   . Hypertension   . Intermittent claudication (HCC)    bilateral  . Latent tuberculosis   . Lumbago   . Shoulder pain    Review of Systems: Negative unless mentioned in the HPI  Physical Exam: Vitals:   07/14/19 1039  BP: 128/81  Pulse: 84  Temp: 98.1 F (36.7 C)  TempSrc: Oral  SpO2: 100%  Weight: 153 lb 12.8 oz (69.8 kg)  Height: 5\' 6"  (1.676 m)   Physical Exam Vitals reviewed.  Constitutional:      General: He is not in acute distress.    Appearance: Normal appearance. He is normal weight. He is not ill-appearing, toxic-appearing or diaphoretic.     Comments: Frail appearing  HENT:     Head: Normocephalic and atraumatic.  Cardiovascular:     Rate and Rhythm: Normal rate and regular rhythm.     Pulses: Normal pulses.     Heart sounds: Normal heart sounds. No murmur heard.  No friction rub. No gallop.      Comments: No JVD Pulmonary:     Effort: Pulmonary effort is normal. No respiratory distress.     Breath sounds: Normal breath sounds. No wheezing or rales.  Abdominal:     General: Abdomen is flat. Bowel sounds are normal. There is no distension.     Palpations: Abdomen is soft.     Tenderness: There is no abdominal tenderness. There is no guarding.  Musculoskeletal:     Right lower leg: No edema.     Left lower leg: No edema.  Neurological:     Mental Status: He is alert. Mental status is at baseline.  Psychiatric:        Mood and Affect: Mood normal.    Assessment & Plan:   See Encounters Tab for problem based charting.  Patient discussed with Dr. Heber Delphos

## 2019-07-15 NOTE — Progress Notes (Signed)
Internal Medicine Clinic Attending  Case discussed with Dr. Alexander at the time of the visit.  We reviewed the resident's history and exam and pertinent patient test results.  I agree with the assessment, diagnosis, and plan of care documented in the resident's note.  

## 2019-07-21 ENCOUNTER — Ambulatory Visit: Payer: Medicare Other

## 2019-07-22 ENCOUNTER — Ambulatory Visit: Payer: Medicare Other

## 2019-07-27 ENCOUNTER — Ambulatory Visit (INDEPENDENT_AMBULATORY_CARE_PROVIDER_SITE_OTHER): Payer: Medicare Other | Admitting: Internal Medicine

## 2019-07-27 ENCOUNTER — Other Ambulatory Visit: Payer: Self-pay

## 2019-07-27 VITALS — BP 135/74 | HR 68 | Ht 71.0 in | Wt 152.1 lb

## 2019-07-27 DIAGNOSIS — E876 Hypokalemia: Secondary | ICD-10-CM

## 2019-07-27 DIAGNOSIS — R2689 Other abnormalities of gait and mobility: Secondary | ICD-10-CM | POA: Diagnosis not present

## 2019-07-27 NOTE — Progress Notes (Signed)
  [facility] Telephone Encounter Continuity Care Appointment  HPI:   This telephone encounter was created for [name] on 07/28/2019 for the following purpose/cc hypokalemia follow up;balance concerns.   Patient was present in the clinic for a visit and was able to have blood work. Due to a misunderstanding, patient departed before he could be seen by a provider. In the PM, tele-health visit was scheduled.    Past Medical History:  Past Medical History:  Diagnosis Date  . Alcohol abuse   . Arthropathy    shoulder  . Chest pain   . Chronic cough   . Foot pain    bilateral  . Hyperlipidemia   . Hypertension   . Intermittent claudication (HCC)    bilateral  . Latent tuberculosis   . Lumbago   . Shoulder pain       ROS:      Assessment / Plan / Recommendations:   Please see A&P under problem oriented charting for assessment of the patient's acute and chronic medical conditions.   As always, pt is advised that if symptoms worsen or new symptoms arise, they should go to an urgent care facility or to to ER for further evaluation.   Consent and Medical Decision Making:   Patient discussed with Dr. Rebeca Alert  This is a telephone encounter between Ian Bushman and Stephen [name] on 07/28/2019 for Hypokalemia follow up;balance concerns. The visit was conducted with the patient located at home and Stephen [name] at [facility]. The patient's identity was confirmed using their DOB and current address. The patient has consented to being evaluated through a telephone encounter and understands the associated risks (an examination cannot be done and the patient may need to come in for an appointment) / benefits (allows the patient to remain at home, decreasing exposure to coronavirus). I personally spent 10 minutes on medical discussion.

## 2019-07-28 ENCOUNTER — Other Ambulatory Visit: Payer: Self-pay | Admitting: Internal Medicine

## 2019-07-28 DIAGNOSIS — R2689 Other abnormalities of gait and mobility: Secondary | ICD-10-CM | POA: Insufficient documentation

## 2019-07-28 DIAGNOSIS — I739 Peripheral vascular disease, unspecified: Secondary | ICD-10-CM

## 2019-07-28 LAB — BMP8+ANION GAP
Anion Gap: 16 mmol/L (ref 10.0–18.0)
BUN/Creatinine Ratio: 17 (ref 10–24)
BUN: 22 mg/dL (ref 8–27)
CO2: 24 mmol/L (ref 20–29)
Calcium: 9.7 mg/dL (ref 8.6–10.2)
Chloride: 98 mmol/L (ref 96–106)
Creatinine, Ser: 1.3 mg/dL — ABNORMAL HIGH (ref 0.76–1.27)
GFR calc Af Amer: 63 mL/min/{1.73_m2} (ref >59)
GFR calc non Af Amer: 55 mL/min/{1.73_m2} — ABNORMAL LOW (ref >59)
Glucose: 85 mg/dL (ref 65–99)
Potassium: 3.7 mmol/L (ref 3.5–5.2)
Sodium: 138 mmol/L (ref 134–144)

## 2019-07-28 NOTE — Assessment & Plan Note (Addendum)
Stephen [name] and his wife are present on telehealth visit this afternoon. Stephen [name] states he has noted approximately 2 weeks of balance concerns with weakness in his bilateral lower extremities. Weakness is worse on the right and Mrs. Wichmann has noted that patient tends to lean towards the right now. She is concerned that this has worsened his ability to ambulate and has led to more frequent falls. He did bump his head on once of the falls, but no abrasion or swelling noted. She denies any other recent trauma. No headache, dizziness.   Assessment/Plan:  History is concerning given his new onset balance concerns with worsening right sided weakness and leaning towards the right. New medication Triamterene-HCTZ started approximately around the same time, however this is consistent with a drug reaction to cause unilateral weakness.   Patient will need a physical examination and possibly imaging for full evaluation, especially in light of patient's recent CVA 6 months ago. Strongly encouraged patient reschedule office visit and they are in agreement.   - Office visit scheduled in 2 days for full evaluation

## 2019-07-28 NOTE — Assessment & Plan Note (Signed)
Repeat K+: 3.7   Stephen [name] states he has been able to take Dyazide without difficulty.   Assessment/Plan:  Improvement in potassium since switching from HCTZ to Triamterene-HCTZ. No medication changes planned today.   - Continue Triamterene-HCTZ 37.5 mg daily

## 2019-07-29 ENCOUNTER — Encounter (HOSPITAL_COMMUNITY): Payer: Self-pay | Admitting: Internal Medicine

## 2019-07-29 ENCOUNTER — Inpatient Hospital Stay (HOSPITAL_COMMUNITY)
Admission: AD | Admit: 2019-07-29 | Discharge: 2019-08-01 | DRG: 312 | Disposition: A | Discharge status: Skilled Nursing Facility | Payer: Medicare Other | Source: Ambulatory Visit | Attending: Internal Medicine | Admitting: Internal Medicine

## 2019-07-29 ENCOUNTER — Ambulatory Visit (INDEPENDENT_AMBULATORY_CARE_PROVIDER_SITE_OTHER): Payer: Medicare Other | Admitting: Internal Medicine

## 2019-07-29 ENCOUNTER — Observation Stay (HOSPITAL_COMMUNITY): Payer: Medicare Other

## 2019-07-29 ENCOUNTER — Other Ambulatory Visit: Payer: Self-pay

## 2019-07-29 VITALS — BP 125/65 | HR 71 | Temp 98.4°F | Ht 71.0 in | Wt 151.2 lb

## 2019-07-29 DIAGNOSIS — D329 Benign neoplasm of meninges, unspecified: Secondary | ICD-10-CM | POA: Diagnosis not present

## 2019-07-29 DIAGNOSIS — T502X5A Adverse effect of carbonic-anhydrase inhibitors, benzothiadiazides and other diuretics, initial encounter: Secondary | ICD-10-CM | POA: Diagnosis not present

## 2019-07-29 DIAGNOSIS — I952 Hypotension due to drugs: Principal | ICD-10-CM

## 2019-07-29 DIAGNOSIS — Z20822 Contact with and (suspected) exposure to covid-19: Secondary | ICD-10-CM | POA: Diagnosis not present

## 2019-07-29 DIAGNOSIS — Z66 Do not resuscitate: Secondary | ICD-10-CM | POA: Diagnosis not present

## 2019-07-29 DIAGNOSIS — Z79899 Other long term (current) drug therapy: Secondary | ICD-10-CM | POA: Diagnosis not present

## 2019-07-29 DIAGNOSIS — Z7902 Long term (current) use of antithrombotics/antiplatelets: Secondary | ICD-10-CM

## 2019-07-29 DIAGNOSIS — E876 Hypokalemia: Secondary | ICD-10-CM | POA: Diagnosis present

## 2019-07-29 DIAGNOSIS — G459 Transient cerebral ischemic attack, unspecified: Secondary | ICD-10-CM | POA: Diagnosis present

## 2019-07-29 DIAGNOSIS — E785 Hyperlipidemia, unspecified: Secondary | ICD-10-CM | POA: Diagnosis present

## 2019-07-29 DIAGNOSIS — I6782 Cerebral ischemia: Secondary | ICD-10-CM | POA: Diagnosis not present

## 2019-07-29 DIAGNOSIS — I639 Cerebral infarction, unspecified: Secondary | ICD-10-CM | POA: Diagnosis present

## 2019-07-29 DIAGNOSIS — R531 Weakness: Secondary | ICD-10-CM | POA: Diagnosis not present

## 2019-07-29 DIAGNOSIS — Z8615 Personal history of latent tuberculosis infection: Secondary | ICD-10-CM

## 2019-07-29 DIAGNOSIS — I69351 Hemiplegia and hemiparesis following cerebral infarction affecting right dominant side: Secondary | ICD-10-CM

## 2019-07-29 DIAGNOSIS — Z7982 Long term (current) use of aspirin: Secondary | ICD-10-CM | POA: Diagnosis not present

## 2019-07-29 DIAGNOSIS — I739 Peripheral vascular disease, unspecified: Secondary | ICD-10-CM | POA: Diagnosis not present

## 2019-07-29 DIAGNOSIS — G319 Degenerative disease of nervous system, unspecified: Secondary | ICD-10-CM | POA: Diagnosis not present

## 2019-07-29 DIAGNOSIS — I10 Essential (primary) hypertension: Secondary | ICD-10-CM | POA: Diagnosis present

## 2019-07-29 DIAGNOSIS — R29818 Other symptoms and signs involving the nervous system: Secondary | ICD-10-CM | POA: Diagnosis not present

## 2019-07-29 MED ORDER — CLOPIDOGREL BISULFATE 75 MG PO TABS
75.0000 mg | ORAL_TABLET | Freq: Every day | ORAL | Status: DC
  Administered 2019-07-30 – 2019-08-01 (×3): 75 mg via ORAL
  Filled 2019-07-29 (×3): qty 1

## 2019-07-29 MED ORDER — POLYETHYLENE GLYCOL 3350 17 G PO PACK: 17.0000 g | PACK | Freq: Every day | ORAL | Status: DC | PRN

## 2019-07-29 MED ORDER — ASPIRIN EC 81 MG PO TBEC
81.0000 mg | DELAYED_RELEASE_TABLET | Freq: Every day | ORAL | Status: DC
  Administered 2019-07-30: 81 mg via ORAL
  Filled 2019-07-29: qty 1

## 2019-07-29 MED ORDER — ATORVASTATIN CALCIUM 80 MG PO TABS
80.0000 mg | ORAL_TABLET | Freq: Every day | ORAL | Status: DC
  Administered 2019-07-30 – 2019-08-01 (×3): 80 mg via ORAL
  Filled 2019-07-29 (×3): qty 1

## 2019-07-29 MED ORDER — ACETAMINOPHEN 650 MG RE SUPP: 650.0000 mg | Freq: Four times a day (QID) | RECTAL | Status: DC | PRN

## 2019-07-29 MED ORDER — LIDOCAINE 5 % EX PTCH: 1.0000 | MEDICATED_PATCH | Freq: Every day | CUTANEOUS | Status: DC | PRN

## 2019-07-29 MED ORDER — PANTOPRAZOLE SODIUM 20 MG PO TBEC
20.0000 mg | DELAYED_RELEASE_TABLET | Freq: Every day | ORAL | Status: DC
  Administered 2019-07-30 – 2019-08-01 (×3): 20 mg via ORAL
  Filled 2019-07-29 (×3): qty 1

## 2019-07-29 MED ORDER — ACETAMINOPHEN 325 MG PO TABS: 650.0000 mg | ORAL_TABLET | Freq: Four times a day (QID) | ORAL | Status: DC | PRN

## 2019-07-29 MED ORDER — GABAPENTIN 400 MG PO CAPS
400.0000 mg | ORAL_CAPSULE | Freq: Three times a day (TID) | ORAL | Status: DC
  Administered 2019-07-29 – 2019-08-01 (×9): 400 mg via ORAL
  Filled 2019-07-29 (×9): qty 1

## 2019-07-29 NOTE — Hospital Course (Signed)
Admitted (Not on file)  Allergies: Aspirin and Lisinopril Pertinent Hx: HTN, HLD, intermittent claudication, and CVA  69 y.o. male p/w right sided weakness x 2 weeks  * Right upper and lower extremity weakness: x2 weeks, balance issues and generalized weakness. Hx of CVA with no residual deficits, on DAPT and atorvastatin and reported taking this. Obtaining MRI to evaluate. Stroke work up. PT/OT. Neurology consulted.   Consults: Neurology  Meds: ASA, plavix,  VTE ppx: Lovenox IVF: None Diet: HH

## 2019-07-29 NOTE — H&P (Signed)
Date: 07/29/2019               Patient Name:  Stephen [name] MRN: [id number]  DOB: [birth year] Age / Sex: 69 y.o., male   PCP: Mitzi Hansen, MD         Medical Service: Internal Medicine Teaching Service         Attending Physician: Dr. Aldine Contes, MD    First Contact: Dr. Marva Panda Pager: [phone]  Second Contact: Dr. Sherry Ruffing Pager: [phone]       After Hours (After 5p/  First Contact Pager: [phone]  weekends / holidays): Second Contact Pager: [phone]   Chief Complaint: Right sided weakness  History of Present Illness: Stephen [name] is a 69 year old male with a history of HTN, HLD, intermittent claudication, and prior CVA with no residual deficits who was seen in clinic and noted to have 1-2 weeks of worsening right sided weakness. History obtained by patient and and his wife. Patient has been having right sided weakness over the past 1-2 weeks. He uses a cane to ambulate at baseline and feels like his balance has been worsening. Per wife, patient is unable to pick up his right leg and is "sliding" it while walking. Per wife, patient has been mostly compliant with medications except two days a few weeks ago when he ran out. He denies any headaches, vision changes, dizziness, fevers, chills, recent illness, recent sick contacts, chest pain or shortness of breath.   Meds:  No current facility-administered medications on file prior to encounter.   Current Outpatient Medications on File Prior to Encounter  Medication Sig Dispense Refill  . amLODipine (NORVASC) 10 MG tablet Take 1 tablet (10 mg total) by mouth daily. 30 tablet 3  . atorvastatin (LIPITOR) 80 MG tablet TAKE ONE (1) TABLET BY MOUTH EVERY DAY AT SIX IN THE EVENING 90 tablet 4  . clopidogrel (PLAVIX) 75 MG tablet TAKE ONE (1) TABLET BY MOUTH EVERY DAY 30 tablet 3  . gabapentin (NEURONTIN) 400 MG capsule Take 1 capsule (400 mg total) by mouth 3 (three) times daily. 90 capsule 3  . Lidocaine 4 % PTCH Apply 1 patch  topically daily as needed (pain).    . pantoprazole (PROTONIX) 20 MG tablet TAKE ONE (1) TABLET BY MOUTH EVERY DAY 90 tablet 3  . triamterene-hydrochlorothiazide (DYAZIDE) 37.5-25 MG capsule Take 1 each (1 capsule total) by mouth daily. 30 capsule 2   Allergies: Allergies as of 07/29/2019 - Review Complete 07/27/2019  Allergen Reaction Noted  . Aspirin Other (See Comments)   . Lisinopril Other (See Comments) 09/28/2011   Past Medical History:  Diagnosis Date  . Alcohol abuse   . Arthropathy    shoulder  . Chest pain   . Chronic cough   . Foot pain    bilateral  . Hyperlipidemia   . Hypertension   . Intermittent claudication (HCC)    bilateral  . Latent tuberculosis   . Lumbago   . Shoulder pain     Family History:  Family History  Problem Relation Age of Onset  . Other Other        there is no specific hx of coronary arterial disease/He does not know his parents history  . Cancer Sister        unknown type    Social History:  Social History   Socioeconomic History  . Marital status: Legally Separated    Spouse name: Not on file  . Number of children: Not  on file  . Years of education: Not on file  . Highest education level: Not on file  Occupational History  . Not on file  Tobacco Use  . Smoking status: Former Smoker    Types: Cigarettes    Quit date: 01/29/1990    Years since quitting: 29.5  . Smokeless tobacco: Never Used  Substance and Sexual Activity  . Alcohol use: No    Alcohol/week: 0.0 standard drinks    Comment: quit alcohol approximately 20 years ago  . Drug use: Yes    Frequency: 1.0 times per week    Types: Marijuana    Comment: marijuana use about once a month. Former cocaine use. quit in the 1980's  . Sexual activity: Not on file  Other Topics Concern  . Not on file  Social History Narrative  . Not on file   Social Determinants of Health   Financial Resource Strain:   . Difficulty of Paying Living Expenses:   Food Insecurity:   .  Worried About Charity fundraiser in the Last Year:   . Arboriculturist in the Last Year:   Transportation Needs:   . Film/video editor (Medical):   Marland Kitchen Lack of Transportation (Non-Medical):   Physical Activity:   . Days of Exercise per Week:   . Minutes of Exercise per Session:   Stress:   . Feeling of Stress :   Social Connections:   . Frequency of Communication with Friends and Family:   . Frequency of Social Gatherings with Friends and Family:   . Attends Religious Services:   . Active Member of Clubs or Organizations:   . Attends Archivist Meetings:   Marland Kitchen Marital Status:   Intimate Partner Violence:   . Fear of Current or Ex-Partner:   . Emotionally Abused:   Marland Kitchen Physically Abused:   . Sexually Abused:    Review of Systems: A complete ROS was negative except as per HPI.  Physical Exam: There were no vitals taken for this visit. Physical Exam Constitutional:      General: He is not in acute distress.    Appearance: Normal appearance.  HENT:     Head: Normocephalic and atraumatic.     Nose: Nose normal.     Mouth/Throat:     Mouth: Mucous membranes are moist.     Pharynx: Oropharynx is clear. No oropharyngeal exudate or posterior oropharyngeal erythema.  Eyes:     General: No scleral icterus.    Extraocular Movements: Extraocular movements intact.     Pupils: Pupils are equal, round, and reactive to light.  Cardiovascular:     Rate and Rhythm: Normal rate and regular rhythm.     Pulses: Normal pulses.     Heart sounds: Normal heart sounds. No murmur heard.  No friction rub. No gallop.   Pulmonary:     Effort: Pulmonary effort is normal. No respiratory distress.     Breath sounds: Normal breath sounds. No wheezing, rhonchi or rales.  Abdominal:     General: Abdomen is flat. Bowel sounds are normal. There is no distension.     Palpations: Abdomen is soft.     Tenderness: There is no abdominal tenderness. There is no rebound.  Musculoskeletal:         General: No swelling, tenderness, deformity or signs of injury. Normal range of motion.     Cervical back: Normal range of motion and neck supple.  Skin:    General: Skin is warm  and dry.     Capillary Refill: Capillary refill takes less than 2 seconds.  Neurological:     Mental Status: He is alert and oriented to person, place, and time.     Cranial Nerves: No cranial nerve deficit.     Sensory: Sensory deficit (Decreased sensation in the right lower extremity) present.     Motor: No weakness (3/5 right lower extremity weakness).     Comments: Mental Status: Patient is awake, alert, oriented x3 No signs of aphasia or neglect Cranial Nerves: II: Pupils equal, round, and reactive to light.  III,IV, VI: EOMI without ptosis or diploplia.  V: Facial sensation increased in right V1 distribution, otherwise symmetric bilaterally  VII: Facial movement is symmetric.  VIII: hearing is intact to voice X: Uvula elevates symmetrically XI: Shoulder shrug is symmetric. XII: tongue is midline without atrophy or fasciculations.  Motor: Left upper and lower extremity 4/5, Right upper and lower extremity 3/5 Sensory: Sensation increased in RUE and decreased in RLE.  Cerebellar: Finger-Nose intact bilaterally, although delayed  Psychiatric:        Mood and Affect: Mood normal.        Behavior: Behavior normal.        Judgment: Judgment normal.    EKG: pending  Assessment & Plan by Problem:  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 Prior history of CVA without any residual deficits. On examination, he has right upper and lower extremity weakness with sensory  deficits on examination. Given the timing of onset of symptoms, not a candidate for TPA at this time.  -Continue ASA and plavix -Continue home atorvastatin -MRI brain -Neurology consult if new infarct noted on MRI - HbA1c  - Lipid panel  - PT/OT eval - Cardiac monitoring  HTN: Patient has history of hypertension and is on triamterene-HCTZ. Currently normotensive.  -Holding home BP med for now  HLD: - Continue atorvastatin 80mg  daily  FEN/GI: Diet: HH Fluids: None Electrolytes: Monitor and replete prn  DVT Prophylaxis: Lovenox Code status: DNR/DNI  Dispo: Admit patient to Observation with expected length of stay less than 2 midnights.  Signed: Harvie Heck, MD  Internal Medicine, PGY-1 07/29/2019, 2:26 PM  Pager: [phone] After 5pm on weekdays and 1pm on weekends: On Call pager: [phone]

## 2019-07-29 NOTE — Progress Notes (Signed)
Report called to [place] on [address].  Patient has Saline lock in left arm.  Alert and oriented.  Ambulates with a cane.  Transported via wheelchair to [address].  Sander Nephew, RN 07/29/2019 4:01 PM.

## 2019-07-29 NOTE — Progress Notes (Signed)
   CC: Right sided weakness   HPI:  Stephen [name] is a 69 y.o. with a PMHx as listed below who presents to the clinic for right sided weakness.   Please see the Encounters tab for problem-based Assessment & Plan regarding status of patient's acute and chronic conditions.  Past Medical History:  Diagnosis Date  . Alcohol abuse   . Arthropathy    shoulder  . Chest pain   . Chronic cough   . Foot pain    bilateral  . Hyperlipidemia   . Hypertension   . Intermittent claudication (HCC)    bilateral  . Latent tuberculosis   . Lumbago   . Shoulder pain    Review of Systems: Review of Systems  Constitutional: Negative for chills, fever and weight loss.  Eyes: Negative for blurred vision and double vision.  Respiratory: Negative for shortness of breath and wheezing.   Cardiovascular: Negative for chest pain, palpitations and leg swelling.  Gastrointestinal: Negative for abdominal pain, diarrhea, nausea and vomiting.  Genitourinary: Negative for dysuria, frequency and urgency.  Musculoskeletal: Positive for back pain (right lower back pain) and falls.  Neurological: Positive for sensory change (right leg and arm) and focal weakness (Right leg and arm). Negative for dizziness and headaches.    Physical Exam:  Vitals:   07/29/19 1049  BP: 128/77  Pulse: 71  Temp: 97.8 F (36.6 C)  TempSrc: Oral  SpO2: 100%  Weight: 151 lb 3.2 oz (68.6 kg)  Height: 5\' 11"  (1.803 m)   Physical Exam Vitals and nursing note reviewed.  Constitutional:      General: He is awake. He is not in acute distress.    Appearance: He is underweight.     Comments: Chronically ill-appearing man who appears older than stated age  Cardiovascular:     Rate and Rhythm: Normal rate and regular rhythm.  Pulmonary:     Effort: Pulmonary effort is normal. No respiratory distress.  Musculoskeletal:        General: No deformity.     Lumbar back: Tenderness (right lumar paraspinal tenderness) present. No  deformity or bony tenderness.     Right lower leg: No edema.     Left lower leg: No edema.  Skin:    General: Skin is warm and dry.  Neurological:     Mental Status: He is alert and oriented to person, place, and time.     Comments:  Left upper and lower extremity 4/5.  Right upper and lower extremity 3/5.  Endorses sensation differs between left and right extremities.  Finger to nose with tremor, however able to completely successfully.    Psychiatric:        Mood and Affect: Mood normal.        Behavior: Behavior normal. Behavior is cooperative.    Assessment & Plan:   See Encounters Tab for problem based charting.  Patient discussed with Dr. Rebeca Alert

## 2019-07-29 NOTE — Progress Notes (Signed)
Internal Medicine Clinic Attending  Case discussed with Dr. Basaraba at the time of the visit.  We reviewed the resident's history and exam and pertinent patient test results.  I agree with the assessment, diagnosis, and plan of care documented in the resident's note.  Gjon Letarte, M.D., Ph.D.  

## 2019-07-30 DIAGNOSIS — K219 Gastro-esophageal reflux disease without esophagitis: Secondary | ICD-10-CM | POA: Diagnosis not present

## 2019-07-30 DIAGNOSIS — E785 Hyperlipidemia, unspecified: Secondary | ICD-10-CM | POA: Diagnosis not present

## 2019-07-30 DIAGNOSIS — Z66 Do not resuscitate: Secondary | ICD-10-CM | POA: Diagnosis present

## 2019-07-30 DIAGNOSIS — Z9181 History of falling: Secondary | ICD-10-CM | POA: Diagnosis not present

## 2019-07-30 DIAGNOSIS — I69359 Hemiplegia and hemiparesis following cerebral infarction affecting unspecified side: Secondary | ICD-10-CM | POA: Diagnosis present

## 2019-07-30 DIAGNOSIS — R5381 Other malaise: Secondary | ICD-10-CM | POA: Diagnosis not present

## 2019-07-30 DIAGNOSIS — I69351 Hemiplegia and hemiparesis following cerebral infarction affecting right dominant side: Secondary | ICD-10-CM | POA: Diagnosis not present

## 2019-07-30 DIAGNOSIS — G459 Transient cerebral ischemic attack, unspecified: Secondary | ICD-10-CM | POA: Diagnosis present

## 2019-07-30 DIAGNOSIS — R1312 Dysphagia, oropharyngeal phase: Secondary | ICD-10-CM | POA: Diagnosis not present

## 2019-07-30 DIAGNOSIS — I952 Hypotension due to drugs: Secondary | ICD-10-CM

## 2019-07-30 DIAGNOSIS — R531 Weakness: Secondary | ICD-10-CM | POA: Diagnosis not present

## 2019-07-30 DIAGNOSIS — R29818 Other symptoms and signs involving the nervous system: Secondary | ICD-10-CM | POA: Diagnosis not present

## 2019-07-30 DIAGNOSIS — D126 Benign neoplasm of colon, unspecified: Secondary | ICD-10-CM | POA: Diagnosis not present

## 2019-07-30 DIAGNOSIS — Z20822 Contact with and (suspected) exposure to covid-19: Secondary | ICD-10-CM | POA: Diagnosis present

## 2019-07-30 DIAGNOSIS — M6281 Muscle weakness (generalized): Secondary | ICD-10-CM | POA: Diagnosis not present

## 2019-07-30 DIAGNOSIS — Z7902 Long term (current) use of antithrombotics/antiplatelets: Secondary | ICD-10-CM | POA: Diagnosis not present

## 2019-07-30 DIAGNOSIS — I69391 Dysphagia following cerebral infarction: Secondary | ICD-10-CM | POA: Diagnosis not present

## 2019-07-30 DIAGNOSIS — Z8615 Personal history of latent tuberculosis infection: Secondary | ICD-10-CM | POA: Diagnosis not present

## 2019-07-30 DIAGNOSIS — Z743 Need for continuous supervision: Secondary | ICD-10-CM | POA: Diagnosis not present

## 2019-07-30 DIAGNOSIS — I739 Peripheral vascular disease, unspecified: Secondary | ICD-10-CM | POA: Diagnosis not present

## 2019-07-30 DIAGNOSIS — Z79899 Other long term (current) drug therapy: Secondary | ICD-10-CM | POA: Diagnosis not present

## 2019-07-30 DIAGNOSIS — G8929 Other chronic pain: Secondary | ICD-10-CM | POA: Diagnosis not present

## 2019-07-30 DIAGNOSIS — E876 Hypokalemia: Secondary | ICD-10-CM | POA: Diagnosis not present

## 2019-07-30 DIAGNOSIS — R7303 Prediabetes: Secondary | ICD-10-CM | POA: Diagnosis not present

## 2019-07-30 DIAGNOSIS — M545 Low back pain: Secondary | ICD-10-CM | POA: Diagnosis not present

## 2019-07-30 DIAGNOSIS — T502X5A Adverse effect of carbonic-anhydrase inhibitors, benzothiadiazides and other diuretics, initial encounter: Secondary | ICD-10-CM | POA: Diagnosis present

## 2019-07-30 DIAGNOSIS — R279 Unspecified lack of coordination: Secondary | ICD-10-CM | POA: Diagnosis not present

## 2019-07-30 DIAGNOSIS — Z7982 Long term (current) use of aspirin: Secondary | ICD-10-CM | POA: Diagnosis not present

## 2019-07-30 DIAGNOSIS — I10 Essential (primary) hypertension: Secondary | ICD-10-CM | POA: Diagnosis not present

## 2019-07-30 LAB — BASIC METABOLIC PANEL
Anion gap: 11 (ref 5–15)
BUN: 17 mg/dL (ref 8–23)
CO2: 26 mmol/L (ref 22–32)
Calcium: 9.1 mg/dL (ref 8.9–10.3)
Chloride: 100 mmol/L (ref 98–111)
Creatinine, Ser: 1.15 mg/dL (ref 0.61–1.24)
GFR calc Af Amer: >60 mL/min (ref >60)
GFR calc non Af Amer: >60 mL/min (ref >60)
Glucose, Bld: 97 mg/dL (ref 70–99)
Potassium: 3.2 mmol/L — ABNORMAL LOW (ref 3.5–5.1)
Sodium: 137 mmol/L (ref 135–145)

## 2019-07-30 LAB — CBC
HCT: 38.7 % — ABNORMAL LOW (ref 39.0–52.0)
Hemoglobin: 13.1 g/dL (ref 13.0–17.0)
MCH: 28.4 pg (ref 26.0–34.0)
MCHC: 33.9 g/dL (ref 30.0–36.0)
MCV: 83.8 fL (ref 80.0–100.0)
Platelets: 209 10*3/uL (ref 150–400)
RBC: 4.62 MIL/uL (ref 4.22–5.81)
RDW: 17.1 % — ABNORMAL HIGH (ref 11.5–15.5)
WBC: 8.6 10*3/uL (ref 4.0–10.5)
nRBC: 0 % (ref 0.0–0.2)

## 2019-07-30 LAB — LIPID PANEL
Cholesterol: 124 mg/dL (ref 0–200)
HDL: 36 mg/dL — ABNORMAL LOW (ref >40)
LDL Cholesterol: 74 mg/dL (ref 0–99)
Total CHOL/HDL Ratio: 3.4 RATIO
Triglycerides: 70 mg/dL (ref <150)
VLDL: 14 mg/dL (ref 0–40)

## 2019-07-30 LAB — HEMOGLOBIN A1C
Hgb A1c MFr Bld: 6.4 % — ABNORMAL HIGH (ref 4.8–5.6)
Mean Plasma Glucose: 136.98 mg/dL

## 2019-07-30 MED ORDER — POTASSIUM CHLORIDE CRYS ER 20 MEQ PO TBCR
40.0000 meq | EXTENDED_RELEASE_TABLET | Freq: Two times a day (BID) | ORAL | Status: AC
  Administered 2019-07-30 (×2): 40 meq via ORAL
  Filled 2019-07-30 (×2): qty 2

## 2019-07-30 NOTE — NC FL2 (Signed)
[place] LEVEL OF CARE SCREENING TOOL     IDENTIFICATION  Patient Name: Stephen [name] Birthdate: Jan 26, 1951 Sex: male Admission Date (Current Location): 07/29/2019  [facility] and [place] Number:  Herbalist and Address:  [facility], [place] [address] [place], [place] [address]      Provider Number: [id number]  Attending Physician Name and Address:  Velna Ochs, MD  Relative Name and Phone Number:       Current Level of Care: Hospital Recommended Level of Care: [place] Prior Approval Number:    Date Approved/Denied:   PASRR Number: [id number]  Discharge Plan: SNF    Current Diagnoses: Patient Active Problem List   Diagnosis Date Noted   Balance problem 07/28/2019   Lung nodule 02/26/2019   Acute left-sided muscle weakness 02/18/2019   CVA (cerebral vascular accident) ([place]) 01/01/2019   Right arm and right-sided back pain 12/09/2017   Tubular adenoma of colon 12/09/2017   Hemorrhoids 07/06/2015   Prediabetes 03/12/2013   Healthcare maintenance 12/19/2012   Hypokalemia 09/19/2012   History of CVA (cerebrovascular accident) 09/08/2012   GERD (gastroesophageal reflux disease) 01/28/2012   Chronic back pain s/p L5-S1 laminectomy 05/09/2011   Chest pain 06/02/2007   Peripheral vascular disease ([place]) 12/10/2006   Hyperlipidemia 06/12/2006   Essential hypertension 06/12/2006    Orientation RESPIRATION BLADDER Height & Weight     Self, Time, Situation, Place  Normal Incontinent Weight: 68.5 kg Height:  5\' 11"  (180.3 cm)  BEHAVIORAL SYMPTOMS/MOOD NEUROLOGICAL BOWEL NUTRITION STATUS      Continent Diet (Heart Healthy with thin liquids)  AMBULATORY STATUS COMMUNICATION OF NEEDS Skin   Limited Assist Verbally Normal                       Personal Care Assistance Level of Assistance  Bathing, Dressing, Feeding Bathing Assistance: Limited assistance Feeding assistance:  Independent Dressing Assistance: Limited assistance     Functional Limitations Info  Sight, Hearing, Speech Sight Info: Adequate Hearing Info: Adequate Speech Info: Adequate    SPECIAL CARE FACTORS FREQUENCY  PT (By licensed PT), OT (By licensed OT)     PT Frequency: 5x/wk OT Frequency: 5x/wk            Contractures Contractures Info: Not present    Additional Factors Info  Code Status, Allergies, Psychotropic Code Status Info: DNR Allergies Info: aspirin/ lisinopril Psychotropic Info: Neurontin 400 mg TID         Current Medications (07/30/2019):  This is the current hospital active medication list Current Facility-Administered Medications  Medication Dose Route Frequency Provider Last Rate Last Admin   acetaminophen (TYLENOL) tablet 650 mg  650 mg Oral Q6H PRN Aslam, Sadia, MD       Or   acetaminophen (TYLENOL) suppository 650 mg  650 mg Rectal Q6H PRN Aslam, Sadia, MD       aspirin EC tablet 81 mg  81 mg Oral Daily Aslam, Sadia, MD   81 mg at 07/30/19 0933   atorvastatin (LIPITOR) tablet 80 mg  80 mg Oral Daily Aslam, Loralyn Freshwater, MD   80 mg at 07/30/19 0933   clopidogrel (PLAVIX) tablet 75 mg  75 mg Oral Daily Aslam, Sadia, MD   75 mg at 07/30/19 0933   gabapentin (NEURONTIN) capsule 400 mg  400 mg Oral TID Harvie Heck, MD   400 mg at 07/30/19 0933   lidocaine (LIDODERM) 5 % 1 patch  1 patch Transdermal Daily PRN  Harvie Heck, MD       pantoprazole (PROTONIX) EC tablet 20 mg  20 mg Oral Daily Aslam, Sadia, MD   20 mg at 07/30/19 0933   polyethylene glycol (MIRALAX / GLYCOLAX) packet 17 g  17 g Oral Daily PRN Aslam, Sadia, MD       potassium chloride SA (KLOR-CON) CR tablet 40 mEq  40 mEq Oral BID WC Briant Cedar, MD   40 mEq at 07/30/19 4758     Discharge Medications: Please see discharge summary for a list of discharge medications.  Relevant Imaging Results:  Relevant Lab Results:   Additional Information SS#: [id number]  Pollie Friar,  RN

## 2019-07-30 NOTE — Assessment & Plan Note (Signed)
Stephen [name] presents to the clinic for evaluation of right-sided weakness.  His wife is at the visit to help with history.  Stephen [name] states that weakness has been present for approximately 2 weeks without any preceding trauma or falls that affected that side.  She had noticed that patient had difficulty moving his right side and had started to fall leaning towards the right.  Stephen [name] endorses right-sided weakness with some associated sensation differences between his right and left extremities.  Due to this, Stephen [name] notes he is been having difficulty with ambulation and his cane no longer provides enough support.  He denies any headache, dizziness, chest pain, palpitations.  Assessment/plan: On examination, patient is mildly weaker on his right upper and lower extremity compared to his left.  Per chart review, his left side was the one affected in the stroke of December 2020 and was previously the most weak side, requiring PT.  Previous office evaluations do not note any right-sided weakness compared to the left, concerning for this being an acute change.    Differential includes acute CVA versus lumbar radiculopathy given his back pain.   At this time, patient warrants admission to [facility] for further evaluation and imaging.  Patient and his wife are in agreement with this plan.  -Admitted to IMTS

## 2019-07-30 NOTE — Evaluation (Signed)
Occupational Therapy Evaluation Patient Details Name: Stephen [name] MRN: [id number] DOB: Jun 19, 1950 Today's Date: 07/30/2019    History of Present Illness Pt is a 69 y.o. male with PMH of HTN, PVD, CVA, GERD, HLD, L5-S1 laminectomy, hypokalemia, prediabetes, and intermittent claudication. Presents to clinic with 1-2 week worsening R-sided weakness. Admitted 07/29/19.  MRI negative for acute infarct, however moderate atrophy and chronic microvascular ischemic changes noted. Right frontal meningioma unchanged.    Clinical Impression   PTA patient reports independent with ADLs, using RW within his home and cane in community, spouse assisting with IADLs.  Admitted for above and limited by problem list below, including impaired balance, decreased activity tolerance/endurance, generalized weakness (L>R), decreased coordination and impaired cognition.  Patient currently requires min-max assist for ADLs, mod to max assist for transfers, and min guard for bed mobility.  VSS during session, but noted labor breathing throughout session and fatigues easily with basic transfer to recliner.  He will benefit from further OT services while admitted and after dc at SNF level to optimize independence and safety with ADLs, mobility prior to dc home.     Follow Up Recommendations  SNF;Supervision/Assistance - 24 hour    Equipment Recommendations  3 in 1 bedside commode    Recommendations for Other Services       Precautions / Restrictions Precautions Precautions: Fall Restrictions Weight Bearing Restrictions: No      Mobility Bed Mobility Overal bed mobility: Needs Assistance Bed Mobility: Supine to Sit     Supine to sit: Min guard     General bed mobility comments: for safety, increased time to scoot forward   Transfers Overall transfer level: Needs assistance Equipment used: 1 person hand held assist Transfers: Sit to/from [facility] Sit to Stand: Mod assist;Max assist Stand  pivot transfers: Min assist;Mod assist       General transfer comment: inital attempt with cane given max assist to partially power up from elevated surface but noted posterior lean, with pushing using both hands from EOB mod assist to power up and steady (face to face) then pivoting with pt supporting self with on therapist shoulder min-mod assist to pivot to recliner with cueing for hand placement, safety and technique.  Noted slidling R LE only    Balance Overall balance assessment: Needs assistance Sitting-balance support: No upper extremity supported;Feet supported Sitting balance-Leahy Scale: Fair Sitting balance - Comments: min guard dynamically   Standing balance support: Single extremity supported;During functional activity Standing balance-Leahy Scale: Poor Standing balance comment: relies on UE and external support                            ADL either performed or assessed with clinical judgement   ADL Overall ADL's : Needs assistance/impaired     Grooming: Minimal assistance;Sitting   Upper Body Bathing: Minimal assistance;Sitting   Lower Body Bathing: Maximal assistance;Sit to/from stand   Upper Body Dressing : Minimal assistance;Sitting   Lower Body Dressing: Maximal assistance;+2 for physical assistance;+2 for safety/equipment;Sit to/from stand   Toilet Transfer: Moderate assistance;Stand-pivot Toilet Transfer Details (indicate cue type and reason): towards L side to recliner, hand held assist          Functional mobility during ADLs: Moderate assistance;Cueing for safety;Cueing for sequencing General ADL Comments: patient limited by impaired balance, weakness, decreased activity tolerance, and impaired cognition      Vision Baseline Vision/History: Wears glasses Wears Glasses: At all times Patient Visual Report: No change  from baseline Vision Assessment?: No apparent visual deficits     Perception     Praxis      Pertinent Vitals/Pain  Pain Assessment: No/denies pain     Hand Dominance Right   Extremity/Trunk Assessment Upper Extremity Assessment Upper Extremity Assessment: Generalized weakness;RUE deficits/detail;LUE deficits/detail RUE Deficits / Details: grossly 3+/5, noted hand atrophy  RUE Sensation: history of peripheral neuropathy RUE Coordination: decreased fine motor LUE Deficits / Details: grossly 3/5 (weaker than R UE), noted hand atrophy --per chart review history of L weakness during last admission  LUE Sensation: history of peripheral neuropathy LUE Coordination: decreased fine motor   Lower Extremity Assessment Lower Extremity Assessment: Defer to PT evaluation       Communication Communication Communication: Expressive difficulties   Cognition Arousal/Alertness: Awake/alert Behavior During Therapy: WFL for tasks assessed/performed Overall Cognitive Status: Impaired/Different from baseline Area of Impairment: Following commands;Safety/judgement;Problem solving;Awareness;Memory                     Memory: Decreased short-term memory Following Commands: Follows one step commands consistently;Follows one step commands with increased time;Follows multi-step commands inconsistently Safety/Judgement: Decreased awareness of safety;Decreased awareness of deficits Awareness: Emergent Problem Solving: Slow processing;Decreased initiation;Difficulty sequencing;Requires verbal cues General Comments: patient oriented and engages appropriately, follows simple commands with increased time but requires multimodal cueing for multistep commands    General Comments  initated discussion for rehab,  pt reports hes interested but not ths same facility he went to last time; faitgues easily with noted labored breathing- SpO2 98% and VSS during session     Exercises     Shoulder Instructions      Home Living Family/patient expects to be discharged to:: Private residence Living Arrangements:  Spouse/significant other Available Help at Discharge: Family;Available 24 hours/day Type of Home: House Home Access: Stairs to enter [employer] of Steps: 4 Entrance Stairs-Rails: Right;Left Home Layout: One level     Bathroom Shower/Tub: Teacher, early years/pre: Standard     Home Equipment: Environmental consultant - 2 wheels;Cane - single point          Prior Functioning/Environment Level of Independence: Independent with assistive device(s)        Comments: independent ADLs using RW in home and cane in commuinty; spouse assist with iADLs.         OT Problem List: Decreased strength;Decreased activity tolerance;Impaired balance (sitting and/or standing);Decreased cognition;Decreased safety awareness;Decreased knowledge of use of DME or AE;Decreased knowledge of precautions;Decreased coordination;Impaired UE functional use      OT Treatment/Interventions: Self-care/ADL training;Therapeutic exercise;DME and/or AE instruction;Therapeutic activities;Patient/family education;Balance training;Cognitive remediation/compensation;Neuromuscular education;Energy conservation    OT Goals(Current goals can be found in the care plan section) Acute Rehab OT Goals Patient Stated Goal: to get better OT Goal Formulation: With patient Time For Goal Achievement: 08/13/19 Potential to Achieve Goals: Good  OT Frequency: Min 2X/week   Barriers to D/C:            Co-evaluation              AM-PAC OT "6 Clicks" Daily Activity     Outcome Measure Help from another person eating meals?: A Little Help from another person taking care of personal grooming?: A Little Help from another person toileting, which includes using toliet, bedpan, or urinal?: A Lot Help from another person bathing (including washing, rinsing, drying)?: A Lot Help from another person to put on and taking off regular upper body clothing?: A Little Help from another person to  put on and taking off regular lower  body clothing?: A Lot 6 Click Score: 15   End of Session Equipment Utilized During Treatment: Gait belt Nurse Communication: Mobility status;Precautions  Activity Tolerance: Patient limited by fatigue Patient left: in chair;with call bell/phone within reach;with chair alarm set  OT Visit Diagnosis: Other abnormalities of gait and mobility (R26.89);Muscle weakness (generalized) (M62.81);Other symptoms and signs involving cognitive function;Other symptoms and signs involving the nervous system (R29.898)                Time: 0932-1000 OT Time Calculation (min): 28 min Charges:  OT General Charges $OT Visit: 1 Visit OT Evaluation $OT Eval Moderate Complexity: 1 Mod OT Treatments $Self Care/Home Management : 8-22 mins  Jolaine Artist, OT Acute Rehabilitation Services Pager [phone] Office [phone]    Delight Stare 07/30/2019, 12:01 PM

## 2019-07-30 NOTE — Care Management Obs Status (Signed)
[facility] NOTIFICATION   Patient Details  Name: Stephen [name] MRN: [id number] Date of Birth: 27-Oct-1950   Medicare Observation Status Notification Given:  Yes    Pollie Friar, RN 07/30/2019, 4:15 PM

## 2019-07-30 NOTE — Evaluation (Signed)
Physical Therapy Evaluation Patient Details Name: Stephen [name] MRN: [id number] DOB: 01-17-1951 Today's Date: 07/30/2019   History of Present Illness  Pt is a 69 y.o. male with PMH of HTN, PVD, CVA, GERD, HLD, L5-S1 laminectomy, hypokalemia, prediabetes, and intermittent claudication. Presents to clinic with 1-2 week worsening R-sided weakness. Admitted 07/29/19.  MRI negative for acute infarct, however moderate atrophy and chronic microvascular ischemic changes noted. Right frontal meningioma unchanged.   Clinical Impression  PTA pt living alone in single story home with 4 steps to enter. Pt reports ambulation with RW in house and cane out in community. Pt is currently limited in safe mobility by decreased safety awareness and knowledge of deficits in presence of LE weakness R>L, and decreased balance and endurance. Pt is min A for sit>stand from chair with steady armrests, min A for ambulation of 3 feet and 3 feet of lateral stepping, however by end of ambulation pt with increased knee buckling, pt able to lock out knees and slide LE for ambulation to HoB. PT recommends SNF level placement, pt reluctantly in agreement but will not return to the facility which he was previously. PT will continue to follow acutely.    Follow Up Recommendations SNF    Equipment Recommendations  None recommended by PT       Precautions / Restrictions Precautions Precautions: Fall Precaution Comments: pt reports multiple falls lately Restrictions Weight Bearing Restrictions: No      Mobility  Bed Mobility Overal bed mobility: Needs Assistance Bed Mobility: Sit to Supine       Sit to supine: Min guard   General bed mobility comments: min guard for safety to return to bed, request HoB elevated but ultimately able to return to flattened bed  Transfers Overall transfer level: Needs assistance Equipment used: Rolling walker (2 wheeled) Transfers: Sit to/from Stand Sit to Stand: Min assist          General transfer comment: min A for power up to RW, increased time and effort, full weight bearing on RLE, pt could not explain why he does not use L LE to assist, could not explain, vc for hand placement   Ambulation/Gait Ambulation/Gait assistance: Min assist Gait Distance (Feet): 3 Feet Assistive device: Rolling walker (2 wheeled) Gait Pattern/deviations: Step-through pattern;Decreased stride length;Trunk flexed Gait velocity: slowed Gait velocity interpretation: <1.31 ft/sec, indicative of household ambulator General Gait Details: min A for steadying with stepping 3 feet forward, with increasing bilateral knee buckling had pt rotate to standing on side of bed, able to take lateral steps toward HoB, with increasing bilateral knee buckling and R LE tremoring      Balance Overall balance assessment: Needs assistance Sitting-balance support: No upper extremity supported;Feet supported Sitting balance-Leahy Scale: Fair Sitting balance - Comments: min guard dynamically   Standing balance support: Single extremity supported;During functional activity Standing balance-Leahy Scale: Poor Standing balance comment: relies on UE and external support                              Pertinent Vitals/Pain Pain Assessment: No/denies pain    Home Living Family/patient expects to be discharged to:: Private residence Living Arrangements: Spouse/significant other Available Help at Discharge: Family;Available 24 hours/day Type of Home: House Home Access: Stairs to enter Entrance Stairs-Rails: Psychiatric nurse of Steps: 4 Home Layout: One level Home Equipment: Walker - 2 wheels;Cane - single point Additional Comments: pt has been using [employer] for years  Prior Function Level of Independence: Independent with assistive device(s)         Comments: independent ADLs using RW in home and cane in commuinty; spouse assist with iADLs.      Hand Dominance   Dominant  Hand: Right    Extremity/Trunk Assessment   Upper Extremity Assessment Upper Extremity Assessment: Defer to OT evaluation RUE Deficits / Details: grossly 3+/5, noted hand atrophy  RUE Sensation: history of peripheral neuropathy RUE Coordination: decreased fine motor LUE Deficits / Details: grossly 3/5 (weaker than R UE), noted hand atrophy --per chart review history of L weakness during last admission  LUE Sensation: history of peripheral neuropathy LUE Coordination: decreased fine motor    Lower Extremity Assessment Lower Extremity Assessment: RLE deficits/detail;LLE deficits/detail RLE Deficits / Details: PROM WFL, strength grossly 2+/5 as not able to activate full ROM RLE Sensation: decreased light touch;decreased proprioception RLE Coordination: decreased fine motor;decreased gross motor LLE Deficits / Details: AROM WFL, strength grossly 3/5 LLE Coordination: decreased fine motor;decreased gross motor       Communication   Communication: Expressive difficulties  Cognition Arousal/Alertness: Awake/alert Behavior During Therapy: WFL for tasks assessed/performed Overall Cognitive Status: Impaired/Different from baseline Area of Impairment: Following commands;Safety/judgement;Problem solving;Awareness;Memory                     Memory: Decreased short-term memory Following Commands: Follows one step commands consistently;Follows one step commands with increased time;Follows multi-step commands inconsistently Safety/Judgement: Decreased awareness of safety;Decreased awareness of deficits Awareness: Emergent Problem Solving: Slow processing;Decreased initiation;Difficulty sequencing;Requires verbal cues General Comments: patient oriented and engages appropriately, follows simple commands with increased time but requires multimodal cueing for multistep commands       General Comments General comments (skin integrity, edema, etc.): pt with labored breathing with transfer  from chair back to bed. SaO2 98%O2 HR 81,         Assessment/Plan    PT Assessment Patient needs continued PT services  PT Problem List Decreased strength;Decreased range of motion;Decreased activity tolerance;Decreased balance;Decreased mobility;Decreased coordination;Decreased cognition;Decreased safety awareness       PT Treatment Interventions DME instruction;Gait training;Stair training;Functional mobility training;Therapeutic activities;Therapeutic exercise;Balance training;Cognitive remediation;Patient/family education    PT Goals (Current goals can be found in the Care Plan section)  Acute Rehab PT Goals Patient Stated Goal: to get better PT Goal Formulation: With patient Time For Goal Achievement: 08/13/19 Potential to Achieve Goals: Fair    Frequency Min 2X/week   Barriers to discharge Decreased caregiver support         AM-PAC PT "6 Clicks" Mobility  Outcome Measure Help needed turning from your back to your side while in a flat bed without using bedrails?: None Help needed moving from lying on your back to sitting on the side of a flat bed without using bedrails?: A Little Help needed moving to and from a bed to a chair (including a wheelchair)?: A Little Help needed standing up from a chair using your arms (e.g., wheelchair or bedside chair)?: A Little Help needed to walk in hospital room?: A Lot Help needed climbing 3-5 steps with a railing? : Total 6 Click Score: 16    End of Session Equipment Utilized During Treatment: Gait belt Activity Tolerance: Patient limited by fatigue Patient left: in bed;with call bell/phone within reach;with bed alarm set Nurse Communication: Mobility status PT Visit Diagnosis: Unsteadiness on feet (R26.81);Other abnormalities of gait and mobility (R26.89);Repeated falls (R29.6);Muscle weakness (generalized) (M62.81);History of falling (Z91.81);Difficulty in walking, not elsewhere classified (  R26.2)    Time: 1430-1450 PT Time  Calculation (min) (ACUTE ONLY): 20 min   Charges:   PT Evaluation $PT Eval Moderate Complexity: 1 Mod          Clovis Warwick B. Migdalia Dk PT, DPT Acute Rehabilitation Services Pager [phone] Office [phone]   Albert 07/30/2019, 3:10 PM

## 2019-07-30 NOTE — Progress Notes (Addendum)
° °  Subjective: Examined patient at bedside. States that he is feeling better. Poorly articulated speech.  States that sensation in legs is "different". Sensation in arms is also different - patient states feeling on Right side arm and leg is more "fuzzy".  Able to lift Right leg higher than left but overall, strength in legs is diminished bilaterally. Reports no tenderness in stomach; no pain in legs bilaterally.  Patient understands that physical therapy will need to assess him before discharge. He states he does not want to go to a skilled nursing facility. States he has no questions.  Objective:  Vital signs in last 24 hours: Vitals:   07/30/19 0423 07/30/19 0505 07/30/19 0846 07/30/19 1138  BP: (!) 83/71 129/75 127/69 130/69  Pulse: 70 68 60 67  Resp: 18 18 18 18   Temp: 98.2 F (36.8 C) (!) 97.5 F (36.4 C) 98.1 F (36.7 C) 97.8 F (36.6 C)  TempSrc: Oral Oral Oral Oral  SpO2: 100% 97% 99% 100%  Weight:      Height:       Physical Exam Vitals reviewed.  Constitutional:      General: He is not in acute distress. HENT:     Head: Normocephalic and atraumatic.  Cardiovascular:     Rate and Rhythm: Normal rate and regular rhythm.     Pulses: Normal pulses.  Pulmonary:     Effort: Pulmonary effort is normal.     Breath sounds: Normal breath sounds.  Abdominal:     General: Abdomen is flat. There is no distension.     Palpations: Abdomen is soft. There is no mass.  Musculoskeletal:        General: No swelling or tenderness.     Right lower leg: No edema.     Left lower leg: No edema.  Neurological:     Mental Status: He is alert.     Comments: Strength  5/5 bilateral upper extremities 4/5 bilateral lower extremities  Sensory: states that feeling in the Right upper and lower extremity is diminished "fuzzy" when compared to the left.  No appreciable neglect.      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 Reports return to baseline of strength -MRI 6/30 shows no acute changes when compared to MRI from 01/02/2019 -PT/OT recommendations pending -Continue home Plavix and atorvistatin -Can discharge today  HTN: Has been hypotension possibly causing hypoperfusion  -continue to hold home BP meds to reevaluate at outpatient   HLD: - Continue atorvastatin 80mg  daily   FEN/GI: Diet: HH Fluids: None Electrolytes: Monitor and replete prn  DVT Prophylaxis: Lovenox Code status: DNR/DNI    Dispo: Anticipated discharge in approximately 0 day(s).   Briant Cedar, MD 07/30/2019, 1:27 PM Pager: @MYPAGER @ After 5pm on weekdays and 1pm on weekends: On Call pager [phone]

## 2019-07-30 NOTE — TOC Initial Note (Signed)
Transition of Care [facility]) - Initial/Assessment Note    Patient Details  Name: Stephen [name] MRN: [id number] Date of Birth: 06-Dec-1950  Transition of Care [facility]) CM/SW Contact:    Pollie Friar, RN Phone Number: 07/30/2019, 4:07 PM  Clinical Narrative:                 CM met with the patient to discuss d/c planning. He is accepting of needing SNF rehab post hospital and asked to be faxed out in the [facility] area. CM asked to if his wife could be contacted and he gave permission.  Pt states he and wife dont live together but she visits every day and provides needed assist and transportation. TOC will provide bed offers when available and will start insurance auth.  CM did attempt to speak with wife and had to leave a message.   Expected Discharge Plan: Skilled Nursing Facility Barriers to Discharge: Continued Medical Work up   Patient Goals and CMS Choice   CMS Medicare.gov Compare Post Acute Care list provided to:: Patient Choice offered to / list presented to : Patient, Spouse  Expected Discharge Plan and Services Expected Discharge Plan: [place] In-house Referral: Clinical Social Work Discharge Planning Services: CM Consult Post Acute Care Choice: [place] arrangements for the past 2 months: [place]                                      Prior Living Arrangements/Services Living arrangements for the past 2 months: Single Family Home Lives with:: Self Patient language and need for interpreter reviewed:: Yes Do you feel safe going back to the place where you live?: Yes      Need for Family Participation in Patient Care: Yes (Comment) Care giver support system in place?: No (comment) Current home services: DME (Wheelchair/ shower seat/ cane/ walker) Criminal Activity/Legal Involvement Pertinent to Current Situation/Hospitalization: No - Comment as needed  Activities of Daily Living Home Assistive  Devices/Equipment: Cane (specify quad or straight) ADL Screening (condition at time of admission) Patient's cognitive ability adequate to safely complete daily activities?: Yes Is the patient deaf or have difficulty hearing?: No Does the patient have difficulty seeing, even when wearing glasses/contacts?: No Does the patient have difficulty concentrating, remembering, or making decisions?: No Patient able to express need for assistance with ADLs?: Yes Does the patient have difficulty dressing or bathing?: No Independently performs ADLs?: Yes (appropriate for developmental age) Does the patient have difficulty walking or climbing stairs?: Yes Weakness of Legs: Both Weakness of Arms/Hands: None  Permission Sought/Granted                  Emotional Assessment Appearance:: Appears stated age Attitude/Demeanor/Rapport: Engaged Affect (typically observed): Accepting Orientation: : Oriented to Self, Oriented to Place, Oriented to  Time, Oriented to Situation   Psych Involvement: No (comment)  Admission diagnosis:  CVA (cerebral vascular accident) [facility]) [I63.9] Patient Active Problem List   Diagnosis Date Noted   Balance problem 07/28/2019   Lung nodule 02/26/2019   Acute left-sided muscle weakness 02/18/2019   CVA (cerebral vascular accident) ([place]) 01/01/2019   Right arm and right-sided back pain 12/09/2017   Tubular adenoma of colon 12/09/2017   Hemorrhoids 07/06/2015   Prediabetes 03/12/2013   Healthcare maintenance 12/19/2012   Hypokalemia 09/19/2012   History of CVA (cerebrovascular accident) 09/08/2012   GERD (gastroesophageal reflux disease) 01/28/2012  Chronic back pain s/p L5-S1 laminectomy 05/09/2011   Chest pain 06/02/2007   Peripheral vascular disease ([place]) 12/10/2006   Hyperlipidemia 06/12/2006   Essential hypertension 06/12/2006   PCP:  Mitzi Hansen, MD Pharmacy:   [id number] [place] [address] [id number]: [phone]: [phone] [id number] [address] [id number]: [phone]: [phone]   Social Determinants of Health (SDOH) Interventions    Readmission Risk Interventions No flowsheet data found.

## 2019-07-31 LAB — BASIC METABOLIC PANEL
Anion gap: 10 (ref 5–15)
BUN: 16 mg/dL (ref 8–23)
CO2: 23 mmol/L (ref 22–32)
Calcium: 9.1 mg/dL (ref 8.9–10.3)
Chloride: 102 mmol/L (ref 98–111)
Creatinine, Ser: 1.26 mg/dL — ABNORMAL HIGH (ref 0.61–1.24)
GFR calc Af Amer: >60 mL/min (ref >60)
GFR calc non Af Amer: 57 mL/min — ABNORMAL LOW (ref >60)
Glucose, Bld: 130 mg/dL — ABNORMAL HIGH (ref 70–99)
Potassium: 3.9 mmol/L (ref 3.5–5.1)
Sodium: 135 mmol/L (ref 135–145)

## 2019-07-31 LAB — SARS CORONAVIRUS 2 (TAT 6-24 HRS): SARS Coronavirus 2: NEGATIVE

## 2019-07-31 NOTE — Progress Notes (Addendum)
Subjective:  Examined patient at the bedside. Lying comfortably preparing to eat breakfast.  States that he feels pretty good.   He reports his strength to be improved from yesterday on both sides.  Sensation also seems to be improved. States that both sides feel almost the same in legs and arms with mild difference in Right side vs Left.  Patient understands that he needs to go to SNF for rehab. Discussed that patient needs to stop his BP meds until his outpatient clinic visit. Patient does not know how many BP medications he takes at home. States that his wife helps him with his meds. Will call wife to inform her about stopping meds.   Objective:  Vital signs in last 24 hours: Vitals:   07/30/19 1949 07/31/19 0351 07/31/19 0854 07/31/19 1206  BP: 114/74 123/75 134/67 (!) 142/68  Pulse: 63 (!) 59 (!) 59 60  Resp: 17 16 16 18   Temp: 97.7 F (36.5 C) 97.7 F (36.5 C) 97.9 F (36.6 C) 97.6 F (36.4 C)  TempSrc: Oral Oral Oral Oral  SpO2: 99% 98% 98% 100%  Weight:      Height:       Physical Exam Vitals and nursing note reviewed.  Constitutional:      General: He is not in acute distress.    Appearance: He is not ill-appearing.  HENT:     Head: Normocephalic and atraumatic.  Eyes:     Extraocular Movements: Extraocular movements intact.  Cardiovascular:     Rate and Rhythm: Normal rate and regular rhythm.     Pulses: Normal pulses.          Radial pulses are 2+ on the right side and 2+ on the left side.       Dorsalis pedis pulses are 2+ on the right side and 2+ on the left side.     Heart sounds: Normal heart sounds, S1 normal and S2 normal. No murmur heard.   Pulmonary:     Effort: Pulmonary effort is normal.     Breath sounds: Normal breath sounds.  Abdominal:     General: Abdomen is flat. There is no distension.     Palpations: Abdomen is soft. There is no mass.  Musculoskeletal:        General: No swelling.     Right lower leg: No edema.     Left lower leg:  No edema.     Comments: Strength 5/5 bilateral upper extremities 4/5 bilaterally lower extremities Sensation mildly different on Right upper and lower extremity when compared with Left upper and lower extremity.  Neurological:     General: No focal deficit present.     Mental Status: He is alert. Mental status is at baseline.     Motor: Motor function is intact.  Psychiatric:        Mood and Affect: Mood normal.        Behavior: Behavior normal.        Thought Content: Thought content normal.      Assessment/Plan: Mr Gales is a 69 yr old male with PMHx of CVA, HTN, HLD, and intermittent claudication.  Principal Problem:   Weakness Active Problems:   CVA (cerebral vascular accident) ([place])   Hypotension due to drugs  Right sided weakness: Hx of CVAs (right lacunar infarct in 2014 followed by left MCA territory infarct in December of 2020) Recrudescence due to Hypotension is most likely cause. Antihypertensives were stopped on admission and he has remained normotensive. MRI  6/30 showing no acute changes when compared to MRI from 01/02/2019 -Will discharge to SNF to continue care -Continue home Plavix and atorvistatin -Medically stable for discharge pending placement    HTN: Episode of hypotension documented on admission. He has had multiple changes to his hypertensive regimen over the past few months. Has poor health literacy and does not know which medications he is taking. We have stopped all antihypertensives here and he has remained normotensive during this hospitalization.  -has been told to stop ALL blood pressure medications until he is reevaluated at outpatient.     HLD: - Continue atorvastatin 80mg  daily     FEN/GI: Diet: HH Fluids: None Electrolytes: Monitor and replete prn   DVT Prophylaxis: Lovenox Code status: DNR/DNI Prior to Admission Living Arrangement: Anticipated Discharge Location: Barriers to Discharge: Dispo: Anticipated discharge pending SNF  placement    Briant Cedar, MD 07/31/2019, 1:24 PM Pager: [phone] After 5pm on weekdays and 1pm on weekends: On Call pager [phone]

## 2019-07-31 NOTE — Progress Notes (Signed)
Physical Therapy Treatment Patient Details Name: Stephen [name] MRN: [id number] DOB: Jul 29, 1950 Today's Date: 07/31/2019    History of Present Illness Pt is a 69 y.o. male with PMH of HTN, PVD, CVA, GERD, HLD, L5-S1 laminectomy, hypokalemia, prediabetes, and intermittent claudication. Presents to clinic with 1-2 week worsening R-sided weakness. Admitted 07/29/19.  MRI negative for acute infarct, however moderate atrophy and chronic microvascular ischemic changes noted. Right frontal meningioma unchanged.     PT Comments    Patient is making progress toward PT goals and tolerated increased mobility well. Pt requires min A for gait training using RW this session. Continues to present with generalized weakness, impaired balance, and gait deviations increasing risk for falls. Continue to progress as tolerated with anticipated d/c to SNF for further skilled PT services.     Follow Up Recommendations  SNF     Equipment Recommendations  None recommended by PT    Recommendations for Other Services       Precautions / Restrictions Precautions Precautions: Fall Precaution Comments: pt reports multiple falls lately Restrictions Weight Bearing Restrictions: No    Mobility  Bed Mobility Overal bed mobility: Needs Assistance Bed Mobility: Supine to Sit     Supine to sit: Min guard     General bed mobility comments: min guard for safety; increased time and effort with use of rail   Transfers Overall transfer level: Needs assistance Equipment used: Rolling walker (2 wheeled) Transfers: Sit to/from Stand Sit to Stand: Min assist         General transfer comment: cues for safe hand placement; assistance to power up into standing and to steady upon standing   Ambulation/Gait Ambulation/Gait assistance: Min assist Gait Distance (Feet): 90 Feet Assistive device: Rolling walker (2 wheeled) Gait Pattern/deviations: Step-through pattern;Decreased stride length;Trunk flexed;Wide base of  support;Decreased step length - left;Decreased step length - right;Decreased dorsiflexion - left Gait velocity: decreased   General Gait Details: assistance for balance/safety especially as pt began to fatigue as increased difficulty to advance L LE noted; pt reports feeling that R LE is weak and heavy; cues for upright posture and increased bilat step lengths   Stairs             Wheelchair Mobility    Modified Rankin (Stroke Patients Only)       Balance Overall balance assessment: Needs assistance Sitting-balance support: No upper extremity supported;Feet supported Sitting balance-Leahy Scale: Fair     Standing balance support: Single extremity supported;During functional activity;Bilateral upper extremity supported Standing balance-Leahy Scale: Poor                              Cognition Arousal/Alertness: Awake/alert Behavior During Therapy: WFL for tasks assessed/performed Overall Cognitive Status: Impaired/Different from baseline Area of Impairment: Following commands;Safety/judgement;Problem solving;Memory                     Memory: Decreased short-term memory Following Commands: Follows one step commands consistently;Follows one step commands with increased time;Follows multi-step commands inconsistently Safety/Judgement: Decreased awareness of safety;Decreased awareness of deficits Awareness: Emergent Problem Solving: Slow processing;Decreased initiation;Difficulty sequencing;Requires verbal cues General Comments: initially pt not speaking and responding with head nods only however when beginning to mobilize began speaking       Exercises      General Comments        Pertinent Vitals/Pain Pain Assessment: No/denies pain    Home Living  Prior Function            PT Goals (current goals can now be found in the care plan section) Progress towards PT goals: Progressing toward goals     Frequency    Min 2X/week      PT Plan Current plan remains appropriate    Co-evaluation              AM-PAC PT "6 Clicks" Mobility   Outcome Measure  Help needed turning from your back to your side while in a flat bed without using bedrails?: None Help needed moving from lying on your back to sitting on the side of a flat bed without using bedrails?: A Little Help needed moving to and from a bed to a chair (including a wheelchair)?: A Little Help needed standing up from a chair using your arms (e.g., wheelchair or bedside chair)?: A Little Help needed to walk in hospital room?: A Little Help needed climbing 3-5 steps with a railing? : A Lot 6 Click Score: 18    End of Session Equipment Utilized During Treatment: Gait belt Activity Tolerance: Patient tolerated treatment well Patient left: with call bell/phone within reach;in chair;with chair alarm set Nurse Communication: Mobility status PT Visit Diagnosis: Unsteadiness on feet (R26.81);Other abnormalities of gait and mobility (R26.89);Repeated falls (R29.6);Muscle weakness (generalized) (M62.81);History of falling (Z91.81);Difficulty in walking, not elsewhere classified (R26.2)     Time: 7544-9201 PT Time Calculation (min) (ACUTE ONLY): 23 min  Charges:  $Gait Training: 23-37 mins                     Earney Navy, PTA Acute Rehabilitation Services Pager: [phone] Office: [phone]     Darliss Cheney 07/31/2019, 1:38 PM

## 2019-07-31 NOTE — Plan of Care (Signed)
°  Problem: Intracerebral Hemorrhage Tissue Perfusion: Goal: Complications of Intracerebral Hemorrhage will be minimized Outcome: Progressing   Problem: Ischemic Stroke/TIA Tissue Perfusion: Goal: Complications of ischemic stroke/TIA will be minimized Outcome: Progressing   Problem: Spontaneous Subarachnoid Hemorrhage Tissue Perfusion: Goal: Complications of Spontaneous Subarachnoid Hemorrhage will be minimized Outcome: Progressing   Problem: Activity: Goal: Risk for activity intolerance will decrease Outcome: Progressing   Problem: Coping: Goal: Level of anxiety will decrease Outcome: Progressing   Problem: Safety: Goal: Ability to remain free from injury will improve Outcome: Progressing

## 2019-07-31 NOTE — Discharge Summary (Addendum)
Name: Stephen [name] MRN: [id number] DOB: 07/08/1950 69 y.o. PCP: Mitzi Hansen, MD  Date of Admission: 07/29/2019  4:27 PM Date of Discharge:  08/01/19 Attending Physician: Velna Ochs, MD  Discharge Diagnosis: 1. TIA vs recrudescence of prior CVA 2. HTN 3. HLD  Discharge Medications: Allergies as of 08/01/2019       Reactions   Aspirin Other (See Comments)   Stomach pain/ dizziness   Lisinopril Other (See Comments)   Acute kidney insufficiency in 2013 (Cr elevated from 1.00 to 1.79        Medication List     STOP taking these medications    amLODipine 10 MG tablet Commonly known as: NORVASC   triamterene-hydrochlorothiazide 37.5-25 MG capsule Commonly known as: Dyazide       TAKE these medications    atorvastatin 80 MG tablet Commonly known as: LIPITOR TAKE ONE (1) TABLET BY MOUTH EVERY DAY AT SIX IN THE EVENING   clopidogrel 75 MG tablet Commonly known as: PLAVIX TAKE 1 TABLET BY MOUTH EVERY DAY What changed: additional instructions   gabapentin 400 MG capsule Commonly known as: NEURONTIN Take 1 capsule (400 mg total) by mouth 3 (three) times daily.   Lidocaine 4 % Ptch Apply 1 patch topically daily as needed (pain).   pantoprazole 20 MG tablet Commonly known as: PROTONIX TAKE ONE (1) TABLET BY MOUTH EVERY DAY What changed:  how much to take how to take this when to take this additional instructions        Disposition and follow-up:   [name] was discharged from [facility] in Stable condition. At the hospital follow up visit please address:  1.  1. TIA vs recrudescence of prior CVA: Presented with acute worsening of prior deficits, MRI negative. Discharged to SNF. Continued on home plavix and statin. Felt due to be due to hypotension documented on admission. Antihypertensives have been discontinued. Please assess strength.   2. HTN: Stopped home triamterene-hctz and amlodipine, he has remained normotensive.  Please assess BP on follow up. Had issues with hypokalemia on HCTZ. If antihypertensive is required, consider different agent.   2.  Labs / imaging needed at time of follow-up: BMP  3.  Pending labs/ test needing follow-up: None  Follow-up Appointments:  Contact information for after-discharge care     Destination     [facility] .   Service: Skilled Nursing Contact information: [address] [place] Hospital Course by problem list: 1. TIA vs recrudescence of prior CVA: This is a 69 year old male with a history of hypertension, hyperlipidemia, PVD, and prior CVAs (right lacunar infarct in 2014 followed by left MCA territory infarct in December of 2020) with residual right sided weakness, ambulates with a cane. He presented with acute worsening of his right-sided weakness. MRI was negative for acute CVA and his weakness returned to baseline the following morning. He was found to be hypotensive on admission and his antihypertensives were held. Over the past few months his BP medications have been changed multiple times. Patient does not know what he is taking. Suspect recrudescence of prior stroke in the setting of medication induced hypotension. Stroke work up significant for mildly elevated A1c at 6.5, given age held off on any medications. PT/OT evaluated and recommended SNF. Patient discharged on Palvix and atorvastatin.   2. HTN: On triamterene-hctz  and amlodipine at home. BP medications had been stopped on his discharge in Dec 2020. On follow up he had reported still taking his HCTZ and his BP was normotensive then so this was continued. Patient has been having issues with hypokalemia thought to be due to the HCTZ so this was switched to triamterene-hctz to try and improve this. It's unclear what he was taking. He remained normotensive throughout his hospitalization off antihypertensive  medications. All meds were stopped on discharge. Please follow up.   3. HLD: Continued home statin.   Discharge Vitals:   BP (!) 142/74 (BP Location: Right Arm)   Pulse 64   Temp 98.3 F (36.8 C) (Oral)   Resp 18   Ht 5\' 11"  (1.803 m)   Wt 68.5 kg   SpO2 100%   BMI 21.06 kg/m   Pertinent Labs, Studies, and Procedures:  CBC Latest Ref Rng & Units 07/30/2019 07/02/2019 02/24/2019  WBC 4.0 - 10.5 K/uL 8.6 8.6 11.0(H)  Hemoglobin 13.0 - 17.0 g/dL 13.1 13.5 13.4  Hematocrit 39 - 52 % 38.7(L) 40.8 39.8  Platelets 150 - 400 K/uL 209 248 399   BMP Latest Ref Rng & Units 07/31/2019 07/30/2019 07/27/2019  Glucose 70 - 99 mg/dL 130(H) 97 85  BUN 8 - 23 mg/dL 16 17 22   Creatinine 0.61 - 1.24 mg/dL 1.26(H) 1.15 1.30(H)  BUN/Creat Ratio 10 - 24 - - 17  Sodium 135 - 145 mmol/L 135 137 138  Potassium 3.5 - 5.1 mmol/L 3.9 3.2(L) 3.7  Chloride 98 - 111 mmol/L 102 100 98  CO2 22 - 32 mmol/L 23 26 24   Calcium 8.9 - 10.3 mg/dL 9.1 9.1 9.7   07/29/19 MRI brain: IMPRESSION: Negative for acute infarct.   Atrophy and chronic microvascular ischemic changes which are moderate to advanced   Right frontal meningioma unchanged.  Discharge Instructions: Discharge Instructions     Call MD for:  difficulty breathing, headache or visual disturbances   Complete by: As directed    Call MD for:  extreme fatigue   Complete by: As directed    Call MD for:  hives   Complete by: As directed    Call MD for:  persistant dizziness or light-headedness   Complete by: As directed    Call MD for:  persistant nausea and vomiting   Complete by: As directed    Call MD for:  redness, tenderness, or signs of infection (pain, swelling, redness, odor or green/yellow discharge around incision site)   Complete by: As directed    Call MD for:  severe uncontrolled pain   Complete by: As directed    Call MD for:  temperature >100.4   Complete by: As directed    Diet - low sodium heart healthy   Complete by: As directed     Increase activity slowly   Complete by: As directed        Signed: [name], MD 08/01/2019, 12:35 PM   Pager: [phone]

## 2019-07-31 NOTE — TOC Progression Note (Signed)
Transition of Care [facility]) - Progression Note    Patient Details  Name: Stephen [name] MRN: [id number] Date of Birth: 21-Jun-1950  [facility]) CM/SW Contact  Pollie Friar, RN Phone Number: 07/31/2019, 1:09 PM  Clinical Narrative:    CM has provided bed offers to the patient and his spouse. [facility] was decided on for SNF rehab. CM has initiated insurance through [employer]. CM has updated [place] at [place].  Pt will need new covid for admission to rehab. MD updated.  TOC following.   Expected Discharge Plan: [place] Barriers to Discharge: Continued Medical Work up  Expected Discharge Plan and Services Expected Discharge Plan: [place] In-house Referral: Clinical Social Work Discharge Planning Services: CM Consult Post Acute Care Choice: [place] arrangements for the past 2 months: Single Family Home                                       Social Determinants of Health (SDOH) Interventions    Readmission Risk Interventions No flowsheet data found.

## 2019-07-31 NOTE — Progress Notes (Signed)
Internal Medicine Clinic Attending  Case discussed with Dr. Basaraba at the time of the visit.  We reviewed the resident's history and exam and pertinent patient test results.  I agree with the assessment, diagnosis, and plan of care documented in the resident's note.  Miyana Mordecai, M.D., Ph.D.  

## 2019-08-01 DIAGNOSIS — M6281 Muscle weakness (generalized): Secondary | ICD-10-CM | POA: Diagnosis not present

## 2019-08-01 DIAGNOSIS — I69351 Hemiplegia and hemiparesis following cerebral infarction affecting right dominant side: Secondary | ICD-10-CM | POA: Diagnosis not present

## 2019-08-01 DIAGNOSIS — I10 Essential (primary) hypertension: Secondary | ICD-10-CM | POA: Diagnosis not present

## 2019-08-01 DIAGNOSIS — R1312 Dysphagia, oropharyngeal phase: Secondary | ICD-10-CM | POA: Diagnosis not present

## 2019-08-01 DIAGNOSIS — I69391 Dysphagia following cerebral infarction: Secondary | ICD-10-CM | POA: Diagnosis not present

## 2019-08-01 DIAGNOSIS — G8929 Other chronic pain: Secondary | ICD-10-CM | POA: Diagnosis not present

## 2019-08-01 DIAGNOSIS — R2689 Other abnormalities of gait and mobility: Secondary | ICD-10-CM | POA: Diagnosis not present

## 2019-08-01 DIAGNOSIS — E785 Hyperlipidemia, unspecified: Secondary | ICD-10-CM | POA: Diagnosis not present

## 2019-08-01 DIAGNOSIS — Z743 Need for continuous supervision: Secondary | ICD-10-CM | POA: Diagnosis not present

## 2019-08-01 DIAGNOSIS — R7303 Prediabetes: Secondary | ICD-10-CM | POA: Diagnosis not present

## 2019-08-01 DIAGNOSIS — D126 Benign neoplasm of colon, unspecified: Secondary | ICD-10-CM | POA: Diagnosis not present

## 2019-08-01 DIAGNOSIS — R5381 Other malaise: Secondary | ICD-10-CM | POA: Diagnosis not present

## 2019-08-01 DIAGNOSIS — Z9181 History of falling: Secondary | ICD-10-CM | POA: Diagnosis not present

## 2019-08-01 DIAGNOSIS — Z741 Need for assistance with personal care: Secondary | ICD-10-CM | POA: Diagnosis not present

## 2019-08-01 DIAGNOSIS — M545 Low back pain: Secondary | ICD-10-CM | POA: Diagnosis not present

## 2019-08-01 DIAGNOSIS — R279 Unspecified lack of coordination: Secondary | ICD-10-CM | POA: Diagnosis not present

## 2019-08-01 DIAGNOSIS — K219 Gastro-esophageal reflux disease without esophagitis: Secondary | ICD-10-CM | POA: Diagnosis not present

## 2019-08-01 DIAGNOSIS — I739 Peripheral vascular disease, unspecified: Secondary | ICD-10-CM | POA: Diagnosis not present

## 2019-08-01 DIAGNOSIS — R29818 Other symptoms and signs involving the nervous system: Secondary | ICD-10-CM | POA: Diagnosis not present

## 2019-08-01 DIAGNOSIS — G629 Polyneuropathy, unspecified: Secondary | ICD-10-CM | POA: Diagnosis not present

## 2019-08-01 DIAGNOSIS — I952 Hypotension due to drugs: Secondary | ICD-10-CM | POA: Diagnosis not present

## 2019-08-01 DIAGNOSIS — Z8673 Personal history of transient ischemic attack (TIA), and cerebral infarction without residual deficits: Secondary | ICD-10-CM | POA: Diagnosis not present

## 2019-08-01 DIAGNOSIS — I639 Cerebral infarction, unspecified: Secondary | ICD-10-CM | POA: Diagnosis not present

## 2019-08-01 DIAGNOSIS — E876 Hypokalemia: Secondary | ICD-10-CM | POA: Diagnosis not present

## 2019-08-01 DIAGNOSIS — R531 Weakness: Secondary | ICD-10-CM | POA: Diagnosis not present

## 2019-08-01 NOTE — Progress Notes (Signed)
   Subjective: Examined patient at bedside. Lying in bed in no apparent distress.  States that he feels better.  Discussed management plan with patient and he nods that he understands. Told him not to take his BP medications until outpatient follow up.  Objective:  Vital signs in last 24 hours: Vitals:   07/31/19 2008 07/31/19 2328 08/01/19 0422 08/01/19 0830  BP: 121/71 129/65 137/77 (!) 138/98  Pulse: 62 (!) 55 67 67  Resp: 17 17 16 18   Temp: 97.7 F (36.5 C) 98.4 F (36.9 C) 97.9 F (36.6 C) 97.8 F (36.6 C)  TempSrc: Oral Oral Oral Oral  SpO2: 99% 100% 100% 100%  Weight:      Height:       General: Elderly male, no acute distress, laying in bed Cardiac: Regular rhythm, no murmurs rubs or gallops Pulmonary: CTA BL, no wheezing, rhonchi, [place] Neuro: Awake, alert, 4/5 strength in right upper extremity, 5/5 strength in other extremities, sensation to light touch equal in lower extremities and upper extremities  Assessment/Plan:  Principal Problem:   Weakness Active Problems:   CVA (cerebral vascular accident) ([place])   Hypotension due to drugs  This is a 69 year old male with a history of hypertension, hyperlipidemia, PVD, and 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   TIA vs recrudescence of prior CVA:  Symptoms improving with BP improvement. Doing well today, ready to go to SNF. PT/OT recommending SNF. CSW assistant and bed available today.  -Will contact wife to update -Continue home atorvastatin and plavix -Discharge today to SNF  HTN: Holding home BP medications due to concern for recrudescence of prior CVA and hypokalemia. Hold on discharge.   HLD: Continued home statin.   Prior to Admission Living Arrangement: Home  Anticipated Discharge Location: SNF Barriers to Discharge: None Dispo: Anticipated discharge in approximately  today.   Virl Axe, MD 08/01/2019, 10:21 AM Pager: [phone] After 5pm on weekdays and 1pm on weekends: On Call pager [phone]

## 2019-08-01 NOTE — Discharge Instructions (Signed)
Stephen [name],   It has been a pleasure working with you and we are glad you're feeling better. You were hospitalized for right sided weakness. Your studies showed that your did not have a new stroke. This may be because of your blood pressure medications, please stop taking all of your blood pressure medicaitons, including the triamterene-hctz and the amlodipine. Please follow up with your PCP to discuss if you need to be restarted on something.    Follow up with your primary care provider in 1-2 weeks  If your symptoms worsen or you develop new symptoms, please seek medical help whether it is your primary care provider or emergency department.  If you have any questions about this hospitalization please call [phone].

## 2019-08-01 NOTE — Progress Notes (Signed)
[facility] arrived to transport patient to [place]. Belongings sent with patient.  Gwendolyn Grant, RN

## 2019-08-01 NOTE — Social Work (Signed)
CSW received insurance authorization, good 7/2 thru 7/6. ID [id number].  Criss Alvine, [place] Social Worker

## 2019-08-01 NOTE — TOC Transition Note (Addendum)
Transition of Care [facility]) - CM/SW Discharge Note   Patient Details  Name: Stephen [name] MRN: [id number] Date of Birth: September 23, 1950  Transition of Care [facility]) CM/SW Contact:  Jacquelynn Cree Phone Number: 08/01/2019, 12:44 PM   Clinical Narrative:      Patient will DC to: [place] notified: [name], wife Transport by: Corey Harold  RN, patient, patient's family, and facility notified of DC. Discharge Summary and FL2 sent to facility. RN to call report prior to discharge [phone] room 223). DC packet on chart. Ambulance transport requested for patient.   CSW will sign off for now as social work intervention is no longer needed. Please consult [place] again if new needs arise.   Final next level of care: Skilled Nursing Facility Barriers to Discharge: No Barriers Identified   Patient Goals and CMS Choice   CMS Medicare.gov Compare Post Acute Care list provided to:: Patient Choice offered to / list presented to : Patient  Discharge Placement              Patient chooses bed at: [facility] Patient to be transferred to facility by: [place] Name of family member notified: [name] Patient and family notified of of transfer: 08/01/19  Discharge Plan and Services In-house Referral: Clinical Social Work Discharge Planning Services: [employer] Consult Post Acute Care Choice: [facility]                               Social Determinants of Health (SDOH) Interventions     Readmission Risk Interventions No flowsheet data found.

## 2019-08-01 NOTE — Progress Notes (Signed)
Nurse called report to [place] at [facility]. Iv removed, No tele to discontinue. Patient belongs packed and at bedside. AVS documentation placed in packet at desk for transport. Gwendolyn Grant, RN

## 2019-08-04 ENCOUNTER — Encounter: Payer: Self-pay | Admitting: Internal Medicine

## 2019-08-04 ENCOUNTER — Non-Acute Institutional Stay (SKILLED_NURSING_FACILITY): Payer: Medicare Other | Admitting: Internal Medicine

## 2019-08-04 DIAGNOSIS — Z8673 Personal history of transient ischemic attack (TIA), and cerebral infarction without residual deficits: Secondary | ICD-10-CM

## 2019-08-04 DIAGNOSIS — R29818 Other symptoms and signs involving the nervous system: Secondary | ICD-10-CM | POA: Diagnosis not present

## 2019-08-04 DIAGNOSIS — I952 Hypotension due to drugs: Secondary | ICD-10-CM | POA: Diagnosis not present

## 2019-08-04 DIAGNOSIS — R4189 Other symptoms and signs involving cognitive functions and awareness: Secondary | ICD-10-CM

## 2019-08-04 DIAGNOSIS — R531 Weakness: Secondary | ICD-10-CM | POA: Diagnosis not present

## 2019-08-04 NOTE — Assessment & Plan Note (Addendum)
Adequate blood pressure control off medications as noted.  Continue to monitor.

## 2019-08-04 NOTE — Assessment & Plan Note (Signed)
07/29/2019 MRI negative for acute change but atrophy and chronic moderate-advanced microvascular ischemic changes present.  Right frontal meningioma stable.

## 2019-08-04 NOTE — Progress Notes (Signed)
NURSING HOME LOCATION:  [facility] ROOM NUMBER:  223-A  CODE STATUS:  DNR  PCP:  Mitzi Hansen, MD  [address] [place] [address]   This is a comprehensive admission note to [facility] performed on this date less than 30 days from date of admission. Included are preadmission medical/surgical history; reconciled medication list; family history; social history and comprehensive review of systems.  Corrections and additions to the records were documented. Comprehensive physical exam was also performed. Additionally a clinical summary was entered for each active diagnosis pertinent to this admission in the Problem List to enhance continuity of care.  HPI: The patient was hospitalized 6/30-08/01/2019 for TIA versus recrudescence of prior CVA in the context of hypotension documented at admission.  He presented with acute worsening of right-sided weakness over 1-2 weeks.  Due to deficits related to previous strokes; he had been using a cane to ambulate. 6/30 MRI was negative for acute change but revealed atrophy and chronic moderate-advanced microvascular ischemic changes.  Right frontal meningioma was stable.  Triamterene-HCTZ and amlodipine antihypertensives were discontinued.  Pressures remain normal throughout the hospitalization off all antihypertensive medications. A1c revealed a value of 6.5%.  No medications were initiated for borderline diabetes. PT/OT evaluated the patient and recommended SNF placement for rehab.  Past medical and surgical history: Includes essential hypertension, dyslipidemia, PVD, history of latent tuberculosis, history of alcohol abuse, and history of prior CVAs.  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 Surgeries and procedures include laminectomy.  Social history: Presently nondrinker; remote smoker.  Family history: Limited history  reviewed.   Review of systems:  Could not be completed due to dementia. (see SLUMS score).  His only complaint was "hurts on this side" indicating the right side.  Otherwise all responses were "little bit, yeh,yeh".  Constitutional: No fever, significant weight change, fatigue  Eyes: No redness, discharge, pain, vision change ENT/mouth: No nasal congestion, purulent discharge, earache, change in hearing, sore throat  Cardiovascular: No chest pain, palpitations, paroxysmal nocturnal dyspnea, claudication, edema  Respiratory: No cough, sputum production, hemoptysis, DOE, significant snoring, apnea Gastrointestinal: No heartburn, dysphagia, abdominal pain, nausea /vomiting, rectal bleeding, melena, change in bowels Genitourinary: No dysuria, hematuria, pyuria, incontinence, nocturia Musculoskeletal: No joint stiffness, joint swelling Dermatologic: No rash, pruritus, change in appearance of skin Neurologic: No dizziness, headache, syncope, seizures, numbness, tingling Psychiatric: No significant anxiety, depression, insomnia, anorexia Endocrine: No change in hair/skin/nails, excessive thirst, excessive hunger, excessive urination  Hematologic/lymphatic: No significant bruising, lymphadenopathy, abnormal bleeding Allergy/immunology: No itchy/watery eyes, significant sneezing, urticaria, angioedema  Physical exam:  Pertinent or positive findings: He is thin and appears chronically ill.  Hair is disheveled and he has an unkempt mustache and beard.  Sclera are muddy.  Dentition is miserable with few remaining maxillary teeth and plaque encrusted lower teeth.  Heart rate is slow and essentially regular.  Breath sounds are diffusely decreased.  Pedal pulses are decreased.  He is weak to opposition generally, slightly more so on the right.  With strength testing in the right upper extremity he exhibited an intention tremor.  General appearance:  no acute distress, increased work of breathing is present.     Lymphatic: No lymphadenopathy about the head, neck, axilla. Eyes: No conjunctival inflammation or lid edema is present. There is no scleral icterus. Ears:  External ear exam shows no significant lesions or deformities.   Nose:  External  nasal examination shows no deformity or inflammation. Nasal mucosa are pink and moist without lesions, exudates Neck:  No thyromegaly, masses, tenderness noted.    Heart:  No gallop, murmur, click, rub.  Lungs:  without wheezes, rhonchi, rales, rubs. Abdomen: Bowel sounds are normal.  Abdomen is soft and nontender with no organomegaly, hernias, masses. GU: Deferred  Extremities:  No cyanosis,  edema. Neurologic exam: Balance, Rhomberg, finger to nose testing could not be completed due to clinical state Skin: Warm & dry w/o tenting. No significant lesions or rash.  See clinical summary under each active problem in the Problem List with associated updated therapeutic plan

## 2019-08-04 NOTE — Patient Instructions (Signed)
See assessment and plan under each diagnosis in the problem list and acutely for this visit 

## 2019-08-04 NOTE — Assessment & Plan Note (Signed)
PT/OT at SNF Continue to hold antihypertensive unless persistent, significant hypertension present

## 2019-08-05 NOTE — Assessment & Plan Note (Signed)
Copy PCP

## 2019-08-09 DIAGNOSIS — R2689 Other abnormalities of gait and mobility: Secondary | ICD-10-CM | POA: Diagnosis not present

## 2019-08-09 DIAGNOSIS — Z741 Need for assistance with personal care: Secondary | ICD-10-CM | POA: Diagnosis not present

## 2019-08-09 DIAGNOSIS — I639 Cerebral infarction, unspecified: Secondary | ICD-10-CM | POA: Diagnosis not present

## 2019-08-09 DIAGNOSIS — M6281 Muscle weakness (generalized): Secondary | ICD-10-CM | POA: Diagnosis not present

## 2019-08-11 ENCOUNTER — Encounter: Payer: Self-pay | Admitting: Adult Health

## 2019-08-11 ENCOUNTER — Non-Acute Institutional Stay (SKILLED_NURSING_FACILITY): Payer: Medicare Other | Admitting: Adult Health

## 2019-08-11 DIAGNOSIS — Z8673 Personal history of transient ischemic attack (TIA), and cerebral infarction without residual deficits: Secondary | ICD-10-CM | POA: Diagnosis not present

## 2019-08-11 DIAGNOSIS — R531 Weakness: Secondary | ICD-10-CM

## 2019-08-11 DIAGNOSIS — E876 Hypokalemia: Secondary | ICD-10-CM

## 2019-08-11 DIAGNOSIS — G629 Polyneuropathy, unspecified: Secondary | ICD-10-CM

## 2019-08-11 DIAGNOSIS — I952 Hypotension due to drugs: Secondary | ICD-10-CM | POA: Diagnosis not present

## 2019-08-11 NOTE — Progress Notes (Signed)
Location:  [place] Room Number: 001-V Place of Service:  SNF (31) Provider:  Durenda Age, DNP, MSN, FNP-BC  Patient Care Team: Mitzi Hansen, MD as PCP - General  Extended Emergency Contact Information Primary Emergency Contact: Froemming,Bonnie Address: [address], [place] [address] [place] Phone: [phone] Relation: Spouse  Code Status:  DNR  Goals of care: Advanced Directive information Advanced Directives 08/11/2019  Does Patient Have a Medical Advance Directive? Yes  Type of Advance Directive Out of facility DNR (pink MOST or yellow form)  Does patient want to make changes to medical advance directive? No - Patient declined  Would patient like information on creating a medical advance directive? No - Patient declined  Pre-existing out of facility DNR order (yellow form or pink MOST form) Yellow form placed in chart (order not valid for inpatient use)     Chief Complaint  Patient presents with  . Medical Management of Chronic Issues    Routine short-term rehabilitation visit    HPI:  Pt is a 69 y.o. male seen today for medical management of chronic diseases.  He is a short-term care resident of [facility].  He has a PMH of essential hypertension, dyslipidemia, PVD, history of latent tuberculosis, history of alcohol abuse and history of prior CVAs.  He was seen in his room today. SBPs ranging from 116 to 134. Currently, he is not on any antihypertensive medications.  He was admitted to [place] on 08/01/19 post [facility] hospitalization 07/29/2019 to 08/01/19.  He presented to the hospital with acute worsening of his right-sided weakness.  MRI was negative for acute CVA and his weakness returned to his baseline the following morning.  He was found to be hypotensive and his antihypertensive medications were discontinued.  It was thought that his weakness was induced  by hypotension from BP medications. Home medications, triamterene-HCTZ and amlodipine were discontinued. He has been admitted to [facility] for short-term rehabilitation.  Past Medical History:  Diagnosis Date  . Alcohol abuse   . Arthropathy    shoulder  . Chest pain   . Chronic cough   . Foot pain    bilateral  . Hyperlipidemia   . Hypertension   . Intermittent claudication (HCC)    bilateral  . Latent tuberculosis   . Lumbago   . Shoulder pain    Past Surgical History:  Procedure Laterality Date  . APPENDECTOMY    . HAMMER TOE SURGERY     right 2nd. and 5th  . LAMINECTOMY  1989-1991   left lumbar     Allergies  Allergen Reactions  . Aspirin Other (See Comments)    Stomach pain/ dizziness  . Lisinopril Other (See Comments)    Acute kidney insufficiency in 2013 (Cr elevated from 1.00 to 1.79    Outpatient Encounter Medications as of 08/11/2019  Medication Sig  . atorvastatin (LIPITOR) 80 MG tablet TAKE ONE (1) TABLET BY MOUTH EVERY DAY AT SIX IN THE EVENING  . bisacodyl (DULCOLAX) 10 MG suppository Place 10 mg rectally daily as needed for moderate constipation.  . clopidogrel (PLAVIX) 75 MG tablet TAKE 1 TABLET BY MOUTH EVERY DAY  . gabapentin (NEURONTIN) 400 MG capsule Take 1 capsule (400 mg total) by mouth 3 (three) times daily.  . Lidocaine (ASPERCREME LIDOCAINE) 4 % PTCH Apply 1 patch topically daily as needed (Pain).  . magnesium hydroxide (MILK OF MAGNESIA) 400  MG/5ML suspension Take 30 mLs by mouth daily as needed for mild constipation.  . pantoprazole (PROTONIX) 20 MG tablet TAKE ONE (1) TABLET BY MOUTH EVERY DAY  . SODIUM PHOSPHATES RE Place 1 each rectally daily as needed (Constipation).   No facility-administered encounter medications on file as of 08/11/2019.    Review of Systems  GENERAL: No change in appetite, no fatigue, no weight changes, no fever or chills MOUTH and THROAT: Denies oral discomfort, gingival pain or bleeding, pain from teeth or  hoarseness   RESPIRATORY: no cough, SOB, DOE, wheezing, hemoptysis CARDIAC: No chest pain, edema or palpitations GI: No abdominal pain, diarrhea, constipation, heart burn, nausea or vomiting GU: Denies dysuria, frequency, hematuria, incontinence, or discharge NEUROLOGICAL: Denies dizziness, syncope or headache PSYCHIATRIC: Denies feelings of depression or anxiety. No report of hallucinations, insomnia, paranoia, or agitation   Immunization History  Administered Date(s) Administered  . Fluad Quad(high Dose 65+) 01/05/2019  . Influenza,inj,Quad PF,6+ Mos 11/07/2016, 10/01/2018  . Pneumococcal Conjugate-13 04/04/2016  . Pneumococcal Polysaccharide-23 01/03/2019  . Tdap 03/12/2013   Pertinent  Health Maintenance Due  Topic Date Due  . INFLUENZA VACCINE  08/30/2019  . COLONOSCOPY  08/29/2026  . PNA vac Low Risk Adult  Completed   Fall Risk  07/27/2019 07/14/2019 07/07/2019 03/17/2019 02/24/2019  Falls in the past year? 1 1 1 1 1   Number falls in past yr: 1 1 1  0 0  Comment 6-7 - - - -  Injury with Fall? 0 0 0 0 0  Risk Factor Category  - - - - -  Risk for fall due to : History of fall(s);Impaired balance/gait;Impaired mobility History of fall(s);Impaired balance/gait;Impaired mobility - History of fall(s);Impaired balance/gait;Impaired mobility History of fall(s);Impaired balance/gait;Impaired mobility  Risk for fall due to: Comment - - - - -  Follow up Falls prevention discussed Falls prevention discussed - Falls evaluation completed;Falls prevention discussed Falls prevention discussed     Vitals:   08/11/19 1254  BP: 129/73  Pulse: 61  Resp: (!) 21  Temp: 98.1 F (36.7 C)  TempSrc: Oral  Weight: 156 lb 12.8 oz (71.1 kg)  Height: 5\' 11"  (1.803 m)   Body mass index is 21.87 kg/m.  Physical Exam  GENERAL APPEARANCE: Well nourished. In no acute distress. Normal body habitus SKIN:  Skin is warm and dry.  MOUTH and THROAT: Lips are without lesions. Oral mucosa is moist and  without lesions. Tongue is normal in shape, size, and color and without lesions RESPIRATORY: Breathing is even & unlabored, BS CTAB CARDIAC: RRR, no murmur,no extra heart sounds, no edema GI: Abdomen soft, normal BS, no masses, no tenderness EXTREMITIES:  Able to move X 4 extremities NEUROLOGICAL: There is no tremor. Speech is clear. Right-sided weakness. Alert to self, disoriented to time and place. PSYCHIATRIC:  Affect and behavior are appropriate  Labs reviewed: Recent Labs    07/07/19 0952 07/14/19 1110 07/27/19 1153 07/30/19 0329 07/31/19 0723  NA 137   < > 138 137 135  K 3.1*   < > 3.7 3.2* 3.9  CL 99   < > 98 100 102  CO2 28   < > 24 26 23   GLUCOSE 95   < > 85 97 130*  BUN 16   < > 22 17 16   CREATININE 1.02   < > 1.30* 1.15 1.26*  CALCIUM 9.3   < > 9.7 9.1 9.1  MG 2.1  --   --   --   --  PHOS 2.9  --   --   --   --    < > = values in this interval not displayed.   Recent Labs    01/01/19 1416 01/02/19 0444  AST 22 23  ALT 17 14  ALKPHOS 83 74  BILITOT 0.4 0.7  PROT 7.9 6.8  ALBUMIN 3.9 3.4*   Recent Labs    02/24/19 1536 07/02/19 1642 07/30/19 0329  WBC 11.0* 8.6 8.6  NEUTROABS  --  4.0  --   HGB 13.4 13.5 13.1  HCT 39.8 40.8 38.7*  MCV 82 85.4 83.8  PLT 399 248 209   Lab Results  Component Value Date   TSH 1.219 03/12/2013   Lab Results  Component Value Date   HGBA1C 6.4 (H) 07/30/2019   Lab Results  Component Value Date   CHOL 124 07/30/2019   HDL 36 (L) 07/30/2019   LDLCALC 74 07/30/2019   TRIG 70 07/30/2019   CHOLHDL 3.4 07/30/2019    Significant Diagnostic Results in last 30 days:  MR BRAIN WO CONTRAST  Result Date: 07/29/2019 CLINICAL DATA:  Subacute neuro deficit.  Right-sided weakness. EXAM: MRI HEAD WITHOUT CONTRAST TECHNIQUE: Multiplanar, multiecho pulse sequences of the brain and surrounding structures were obtained without intravenous contrast. COMPARISON:  MRI head 01/02/2019, CT 01/01/2019 FINDINGS: Brain: Negative for acute  infarct. Moderate atrophy. Ventricular enlargement consistent with atrophy is unchanged. Moderate to advanced chronic microvascular ischemic change in the white matter. Chronic ischemia in the basal ganglia bilaterally and in the right cerebellum. Right frontal calcified extra-axial mass compatible with meningioma measuring 10 x 15 mm. No change from prior studies. No intracranial hemorrhage identified. Vascular: Normal arterial flow voids. Skull and upper cervical spine: Negative Sinuses/Orbits: Paranasal sinuses clear.  Negative orbit. Other: None IMPRESSION: Negative for acute infarct. Atrophy and chronic microvascular ischemic changes which are moderate to advanced Right frontal meningioma unchanged. Electronically Signed   By: Franchot Gallo M.D.   On: 07/29/2019 18:41    Assessment/Plan  1. Right sided weakness -  On PT and OT for therapeutic strengthening exercises -    Fall precautions  2. Hypotension due to drugs -  BPs stable - not on any antihypertensive medications, Amlodipine and Triamterene-HCTZ were discontinued -  Monitor BPs  3. History of CVA (cerebrovascular accident) - stable, continue Plavix and Atorvastatin  4. Hypokalemia Lab Results  Component Value Date   K 3.9 07/31/2019   -  Triamterene-HCTZ was discontinued  5. Neuropathy -Stable, continue gabapentin      Family/ staff Communication:   Discussed plan of care with resident and charge nurse.  Labs/tests ordered:  None  Goals of care:   Short-term care   Durenda Age, DNP, FNP-BC [facility] [phone] (Monday-Friday 8:00 a.m. - 5:00 p.m.) [phone] (after hours)

## 2019-08-14 ENCOUNTER — Non-Acute Institutional Stay (SKILLED_NURSING_FACILITY): Payer: Medicare Other | Admitting: Adult Health

## 2019-08-14 ENCOUNTER — Encounter: Payer: Self-pay | Admitting: Adult Health

## 2019-08-14 DIAGNOSIS — Z8673 Personal history of transient ischemic attack (TIA), and cerebral infarction without residual deficits: Secondary | ICD-10-CM

## 2019-08-14 DIAGNOSIS — E876 Hypokalemia: Secondary | ICD-10-CM | POA: Diagnosis not present

## 2019-08-14 DIAGNOSIS — R531 Weakness: Secondary | ICD-10-CM | POA: Diagnosis not present

## 2019-08-14 DIAGNOSIS — K219 Gastro-esophageal reflux disease without esophagitis: Secondary | ICD-10-CM

## 2019-08-14 DIAGNOSIS — R29818 Other symptoms and signs involving the nervous system: Secondary | ICD-10-CM

## 2019-08-14 DIAGNOSIS — R4189 Other symptoms and signs involving cognitive functions and awareness: Secondary | ICD-10-CM

## 2019-08-14 DIAGNOSIS — I952 Hypotension due to drugs: Secondary | ICD-10-CM | POA: Diagnosis not present

## 2019-08-14 DIAGNOSIS — G629 Polyneuropathy, unspecified: Secondary | ICD-10-CM | POA: Diagnosis not present

## 2019-08-14 MED ORDER — GABAPENTIN 400 MG PO CAPS: 400.0000 mg | ORAL_CAPSULE | Freq: Three times a day (TID) | ORAL | 0 refills | Status: DC

## 2019-08-14 MED ORDER — ATORVASTATIN CALCIUM 80 MG PO TABS: ORAL_TABLET | ORAL | 0 refills | Status: DC

## 2019-08-14 MED ORDER — CLOPIDOGREL BISULFATE 75 MG PO TABS: ORAL_TABLET | ORAL | 0 refills | Status: DC

## 2019-08-14 MED ORDER — PANTOPRAZOLE SODIUM 20 MG PO TBEC: DELAYED_RELEASE_TABLET | ORAL | 0 refills | Status: DC

## 2019-08-14 NOTE — Progress Notes (Signed)
Location:  [facility] Room Number: 161-W Place of Service:  SNF (31) Provider:  Durenda Age, DNP, FNP-BC  Patient Care Team: Mitzi Hansen, MD as PCP - General  Extended Emergency Contact Information Primary Emergency Contact: Regala,Bonnie Address: [address]          [place], [place] [address] [place] Phone: [phone] Relation: Spouse  Code Status:  DNR  Goals of care: Advanced Directive information Advanced Directives 08/11/2019  Does Patient Have a Medical Advance Directive? Yes  Type of Advance Directive Out of facility DNR (pink MOST or yellow form)  Does patient want to make changes to medical advance directive? No - Patient declined  Would patient like information on creating a medical advance directive? No - Patient declined  Pre-existing out of facility DNR order (yellow form or pink MOST form) Yellow form placed in chart (order not valid for inpatient use)     Chief Complaint  Patient presents with   Discharge Note    Patient seen for discharge on 08/16/19    HPI:  Pt is a 69 y.o. male who is for discharge home on 08/16/2019 with home health PT and OT.  He was admitted to [facility] on 08/01/19 post [facility] hospitalization 07/29/2019 to 08/01/19.  He presented to the hospital with acute worsening right-sided weakness.  MRI was negative for acute CVA and his weakness returned to his baseline the following morning.  He was found to be hypotensive and his antihypertensive medications were discontinued.  It was thought that his weakness was induced by his BP medications.  Home medications, triamterene-HCTZ and amlodipine were discontinued.  Patient was admitted to this facility for short-term rehabilitation after the patient's recent hospitalization.  Patient has completed SNF rehabilitation and therapy has cleared the patient for discharge.   Past Medical History:  Diagnosis Date   Alcohol  abuse    Arthropathy    shoulder   Chest pain    Chronic cough    Foot pain    bilateral   Hyperlipidemia    Hypertension    Intermittent claudication (HCC)    bilateral   Latent tuberculosis    Lumbago    Shoulder pain    Past Surgical History:  Procedure Laterality Date   APPENDECTOMY     HAMMER TOE SURGERY     right 2nd. and 5th   LAMINECTOMY  1989-1991   left lumbar     Allergies  Allergen Reactions   Aspirin Other (See Comments)    Stomach pain/ dizziness   Lisinopril Other (See Comments)    Acute kidney insufficiency in 2013 (Cr elevated from 1.00 to 1.79    Outpatient Encounter Medications as of 08/14/2019  Medication Sig   atorvastatin (LIPITOR) 80 MG tablet TAKE ONE (1) TABLET BY MOUTH EVERY DAY AT SIX IN THE EVENING   bisacodyl (DULCOLAX) 10 MG suppository Place 10 mg rectally daily as needed for moderate constipation.   clopidogrel (PLAVIX) 75 MG tablet TAKE 1 TABLET BY MOUTH EVERY DAY   gabapentin (NEURONTIN) 400 MG capsule Take 1 capsule (400 mg total) by mouth 3 (three) times daily.   Lidocaine (ASPERCREME LIDOCAINE) 4 % PTCH Apply 1 patch topically daily as needed (Pain).   magnesium hydroxide (MILK OF MAGNESIA) 400 MG/5ML suspension Take 30 mLs by mouth daily as needed for mild constipation.   pantoprazole (PROTONIX) 20 MG tablet TAKE ONE (1) TABLET BY MOUTH EVERY DAY   SODIUM PHOSPHATES RE Place 1  each rectally daily as needed (Constipation).   [DISCONTINUED] atorvastatin (LIPITOR) 80 MG tablet TAKE ONE (1) TABLET BY MOUTH EVERY DAY AT SIX IN THE EVENING   [DISCONTINUED] clopidogrel (PLAVIX) 75 MG tablet TAKE 1 TABLET BY MOUTH EVERY DAY   [DISCONTINUED] gabapentin (NEURONTIN) 400 MG capsule Take 1 capsule (400 mg total) by mouth 3 (three) times daily.   [DISCONTINUED] pantoprazole (PROTONIX) 20 MG tablet TAKE ONE (1) TABLET BY MOUTH EVERY DAY   No facility-administered encounter medications on file as of 08/14/2019.    Review  of Systems  GENERAL: No change in appetite, no fatigue, no weight changes, no fever or chills  MOUTH and THROAT: Denies oral discomfort, gingival pain or bleeding, pain from teeth or hoarseness   RESPIRATORY: no cough, SOB, DOE, wheezing, hemoptysis CARDIAC: No chest pain, edema or palpitations GI: No abdominal pain, diarrhea, constipation, heart burn GU: Denies dysuria, frequency, hematuria, incontinence, or discharge NEUROLOGICAL: Denies dizziness, syncope or headache PSYCHIATRIC: Denies feelings of depression or anxiety. No report of hallucinations, insomnia, paranoia, or agitation   Immunization History  Administered Date(s) Administered   Fluad Quad(high Dose 65+) 01/05/2019   Influenza,inj,Quad PF,6+ Mos 11/07/2016, 10/01/2018   Pneumococcal Conjugate-13 04/04/2016   Pneumococcal Polysaccharide-23 01/03/2019   Tdap 03/12/2013   Pertinent  Health Maintenance Due  Topic Date Due   INFLUENZA VACCINE  08/30/2019   COLONOSCOPY  08/29/2026   PNA vac Low Risk Adult  Completed   Fall Risk  07/27/2019 07/14/2019 07/07/2019 03/17/2019 02/24/2019  Falls in the past year? 1 1 1 1 1   Number falls in past yr: 1 1 1  0 0  Comment 6-7 - - - -  Injury with Fall? 0 0 0 0 0  Risk Factor Category  - - - - -  Risk for fall due to : History of fall(s);Impaired balance/gait;Impaired mobility History of fall(s);Impaired balance/gait;Impaired mobility - History of fall(s);Impaired balance/gait;Impaired mobility History of fall(s);Impaired balance/gait;Impaired mobility  Risk for fall due to: Comment - - - - -  Follow up Falls prevention discussed Falls prevention discussed - Falls evaluation completed;Falls prevention discussed Falls prevention discussed     Vitals:   08/14/19 1528  BP: (!) 142/67  Pulse: 63  Resp: 19  Temp: 98 F (36.7 C)  TempSrc: Oral  Weight: 157 lb 3.2 oz (71.3 kg)  Height: 5\' 11"  (1.803 m)   Body mass index is 21.92 kg/m.  Physical Exam  GENERAL APPEARANCE:  Well nourished. In no acute distress. Normal body habitus SKIN:  Skin is warm and dry.  MOUTH and THROAT: Lips are without lesions. Oral mucosa is moist and without lesions. Tongue is normal in shape, size, and color and without lesions RESPIRATORY: Breathing is even & unlabored, BS CTAB CARDIAC: RRR, no murmur,no extra heart sounds, no edema GI: Abdomen soft, normal BS, no masses, no tenderness EXTREMITIES:  Able to move X 4 extremities NEUROLOGICAL: There is no tremor. Speech is clear. Alert to self, disoriented to time and place PSYCHIATRIC:  Affect and behavior are appropriate  Labs reviewed: Recent Labs    07/07/19 0952 07/14/19 1110 07/27/19 1153 07/30/19 0329 07/31/19 0723  NA 137   < > 138 137 135  K 3.1*   < > 3.7 3.2* 3.9  CL 99   < > 98 100 102  CO2 28   < > 24 26 23   GLUCOSE 95   < > 85 97 130*  BUN 16   < > 22 17 16   CREATININE 1.02   < >  1.30* 1.15 1.26*  CALCIUM 9.3   < > 9.7 9.1 9.1  MG 2.1  --   --   --   --   PHOS 2.9  --   --   --   --    < > = values in this interval not displayed.   Recent Labs    01/01/19 1416 01/02/19 0444  AST 22 23  ALT 17 14  ALKPHOS 83 74  BILITOT 0.4 0.7  PROT 7.9 6.8  ALBUMIN 3.9 3.4*   Recent Labs    02/24/19 1536 07/02/19 1642 07/30/19 0329  WBC 11.0* 8.6 8.6  NEUTROABS  --  4.0  --   HGB 13.4 13.5 13.1  HCT 39.8 40.8 38.7*  MCV 82 85.4 83.8  PLT 399 248 209   Lab Results  Component Value Date   TSH 1.219 03/12/2013   Lab Results  Component Value Date   HGBA1C 6.4 (H) 07/30/2019   Lab Results  Component Value Date   CHOL 124 07/30/2019   HDL 36 (L) 07/30/2019   LDLCALC 74 07/30/2019   TRIG 70 07/30/2019   CHOLHDL 3.4 07/30/2019    Significant Diagnostic Results in last 30 days:  MR BRAIN WO CONTRAST  Result Date: 07/29/2019 CLINICAL DATA:  Subacute neuro deficit.  Right-sided weakness. EXAM: MRI HEAD WITHOUT CONTRAST TECHNIQUE: Multiplanar, multiecho pulse sequences of the brain and surrounding  structures were obtained without intravenous contrast. COMPARISON:  MRI head 01/02/2019, CT 01/01/2019 FINDINGS: Brain: Negative for acute infarct. Moderate atrophy. Ventricular enlargement consistent with atrophy is unchanged. Moderate to advanced chronic microvascular ischemic change in the white matter. Chronic ischemia in the basal ganglia bilaterally and in the right cerebellum. Right frontal calcified extra-axial mass compatible with meningioma measuring 10 x 15 mm. No change from prior studies. No intracranial hemorrhage identified. Vascular: Normal arterial flow voids. Skull and upper cervical spine: Negative Sinuses/Orbits: Paranasal sinuses clear.  Negative orbit. Other: None IMPRESSION: Negative for acute infarct. Atrophy and chronic microvascular ischemic changes which are moderate to advanced Right frontal meningioma unchanged. Electronically Signed   By: Franchot Gallo M.D.   On: 07/29/2019 18:41    Assessment/Plan  1. Right sided weakness -   For home health PT and OT, for therapeutic and strengthening exercises -   Fall precautions -    Follow-up with PCP       2. Hypotension due to drugs -    BPs stable, not on any antihypertensive medications           since  amlodipine and triamterene- HCTZ were discontinued -    Monitor BPs  3. Neuropathy - gabapentin (NEURONTIN) 400 MG capsule; Take 1 capsule (400 mg total) by mouth 3 (three) times daily.  Dispense: 90 capsule; Refill: 0  4. Hypokalemia Lab Results  Component Value Date   K 3.9 07/31/2019   -Triamterene-HCTZ was discontinued - Resolved  5. History of CVA (cerebrovascular accident) -  stable -   atorvastatin (LIPITOR) 80 MG tablet; TAKE ONE (1) TABLET BY MOUTH EVERY DAY AT SIX IN THE EVENING  Dispense: 30 tablet; Refill: 0 -   clopidogrel (PLAVIX) 75 MG tablet; TAKE 1 TABLET BY MOUTH EVERY DAY  Dispense: 30 tablet; Refill: 0  6. Gastroesophageal reflux disease, unspecified whether esophagitis present -    pantoprazole (PROTONIX) 20 MG tablet; TAKE ONE (1) TABLET BY MOUTH EVERY DAY  Dispense: 30 tablet; Refill: 0  7. Neurocognitive deficits -   BIMS score 10/15, ranging in moderately  impaired      cognition -   Continue supportive care      I have filled out patient's discharge paperwork and e-prescribed medications. Patient will have home health PT and OT.  DME provided:  Rollator and 3-in-1  Total discharge time: Greater than 30 minutes Greater than 50% was spent in counseling and coordination of care.   Discharge time involved coordination of the discharge process with social worker, nursing staff and therapy department. Medical justification for home health services/DME verified.   Durenda Age, DNP, FNP-BC [facility] [phone] (Monday-Friday 8:00 a.m. - 5:00 p.m.) [phone] (after hours)

## 2019-08-20 ENCOUNTER — Encounter: Payer: Medicare Other | Admitting: Internal Medicine

## 2019-08-20 ENCOUNTER — Telehealth: Payer: Self-pay | Admitting: Internal Medicine

## 2019-08-20 NOTE — Telephone Encounter (Signed)
  Reason for call:   I placed an outgoing call to [name] at 10:00 am AM regarding his missed appointment. States his wife is sick and had no way of coming in. Since discharge from rehab, he states he still feels weak with ambulation and does not get around that well. I encouraged him to call and make an appointment as soon as his wife feels better and can get him here.    Assessment/ Plan:   Recrudescence of prior CVA: recently admitted with right sided weakness. Stroke work-up was negative, felt to be due to worsening deficits secondary to hypotension. BP meds were discontinued upon discharge to rehab facility. He thinks he may have had some improvement with rehab, but not much and still struggles with ambulation at this time. Will re-evaluate blood pressure and strength at follow-up appointment.   As always, pt is advised that if symptoms worsen or new symptoms arise, they should go to an urgent care facility or to to ER for further evaluation.   Modena Nunnery D, DO   08/20/2019, 10:06 AM

## 2019-08-23 ENCOUNTER — Inpatient Hospital Stay (HOSPITAL_COMMUNITY)
Admission: EM | Admit: 2019-08-23 | Discharge: 2019-08-27 | DRG: 917 | Disposition: A | Discharge status: Skilled Nursing Facility | Payer: Medicare Other | Attending: Internal Medicine | Admitting: Internal Medicine

## 2019-08-23 ENCOUNTER — Observation Stay (HOSPITAL_COMMUNITY): Payer: Medicare Other

## 2019-08-23 ENCOUNTER — Emergency Department (HOSPITAL_COMMUNITY): Payer: Medicare Other

## 2019-08-23 ENCOUNTER — Encounter (HOSPITAL_COMMUNITY): Payer: Self-pay | Admitting: Radiology

## 2019-08-23 ENCOUNTER — Other Ambulatory Visit: Payer: Self-pay

## 2019-08-23 DIAGNOSIS — Z20822 Contact with and (suspected) exposure to covid-19: Secondary | ICD-10-CM | POA: Diagnosis present

## 2019-08-23 DIAGNOSIS — M79601 Pain in right arm: Secondary | ICD-10-CM | POA: Diagnosis present

## 2019-08-23 DIAGNOSIS — I739 Peripheral vascular disease, unspecified: Secondary | ICD-10-CM | POA: Diagnosis present

## 2019-08-23 DIAGNOSIS — Z8673 Personal history of transient ischemic attack (TIA), and cerebral infarction without residual deficits: Secondary | ICD-10-CM

## 2019-08-23 DIAGNOSIS — D320 Benign neoplasm of cerebral meninges: Secondary | ICD-10-CM | POA: Diagnosis not present

## 2019-08-23 DIAGNOSIS — R4781 Slurred speech: Secondary | ICD-10-CM | POA: Diagnosis not present

## 2019-08-23 DIAGNOSIS — I69351 Hemiplegia and hemiparesis following cerebral infarction affecting right dominant side: Secondary | ICD-10-CM

## 2019-08-23 DIAGNOSIS — G92 Toxic encephalopathy: Secondary | ICD-10-CM | POA: Diagnosis present

## 2019-08-23 DIAGNOSIS — N179 Acute kidney failure, unspecified: Secondary | ICD-10-CM | POA: Diagnosis not present

## 2019-08-23 DIAGNOSIS — I10 Essential (primary) hypertension: Secondary | ICD-10-CM | POA: Diagnosis not present

## 2019-08-23 DIAGNOSIS — Y929 Unspecified place or not applicable: Secondary | ICD-10-CM

## 2019-08-23 DIAGNOSIS — Z886 Allergy status to analgesic agent status: Secondary | ICD-10-CM | POA: Diagnosis not present

## 2019-08-23 DIAGNOSIS — Z87891 Personal history of nicotine dependence: Secondary | ICD-10-CM | POA: Diagnosis not present

## 2019-08-23 DIAGNOSIS — R4182 Altered mental status, unspecified: Secondary | ICD-10-CM

## 2019-08-23 DIAGNOSIS — E785 Hyperlipidemia, unspecified: Secondary | ICD-10-CM | POA: Diagnosis not present

## 2019-08-23 DIAGNOSIS — I6501 Occlusion and stenosis of right vertebral artery: Secondary | ICD-10-CM | POA: Diagnosis not present

## 2019-08-23 DIAGNOSIS — R2689 Other abnormalities of gait and mobility: Secondary | ICD-10-CM | POA: Diagnosis present

## 2019-08-23 DIAGNOSIS — E876 Hypokalemia: Secondary | ICD-10-CM | POA: Diagnosis not present

## 2019-08-23 DIAGNOSIS — T407X1A Poisoning by cannabis (derivatives), accidental (unintentional), initial encounter: Principal | ICD-10-CM | POA: Diagnosis present

## 2019-08-23 DIAGNOSIS — R4701 Aphasia: Secondary | ICD-10-CM | POA: Diagnosis present

## 2019-08-23 DIAGNOSIS — R29818 Other symptoms and signs involving the nervous system: Secondary | ICD-10-CM | POA: Diagnosis present

## 2019-08-23 DIAGNOSIS — Z751 Person awaiting admission to adequate facility elsewhere: Secondary | ICD-10-CM | POA: Diagnosis not present

## 2019-08-23 DIAGNOSIS — K219 Gastro-esophageal reflux disease without esophagitis: Secondary | ICD-10-CM | POA: Diagnosis present

## 2019-08-23 DIAGNOSIS — Z79899 Other long term (current) drug therapy: Secondary | ICD-10-CM

## 2019-08-23 DIAGNOSIS — R4189 Other symptoms and signs involving cognitive functions and awareness: Secondary | ICD-10-CM | POA: Diagnosis present

## 2019-08-23 DIAGNOSIS — Z7902 Long term (current) use of antithrombotics/antiplatelets: Secondary | ICD-10-CM | POA: Diagnosis not present

## 2019-08-23 DIAGNOSIS — Z888 Allergy status to other drugs, medicaments and biological substances status: Secondary | ICD-10-CM | POA: Diagnosis not present

## 2019-08-23 DIAGNOSIS — G9340 Encephalopathy, unspecified: Secondary | ICD-10-CM | POA: Diagnosis present

## 2019-08-23 DIAGNOSIS — R569 Unspecified convulsions: Secondary | ICD-10-CM | POA: Diagnosis not present

## 2019-08-23 LAB — DIFFERENTIAL
Abs Immature Granulocytes: 0.03 10*3/uL (ref 0.00–0.07)
Basophils Absolute: 0.1 10*3/uL (ref 0.0–0.1)
Basophils Relative: 1 %
Eosinophils Absolute: 0.3 10*3/uL (ref 0.0–0.5)
Eosinophils Relative: 3 %
Immature Granulocytes: 0 %
Lymphocytes Relative: 46 %
Lymphs Abs: 4.6 10*3/uL — ABNORMAL HIGH (ref 0.7–4.0)
Monocytes Absolute: 0.8 10*3/uL (ref 0.1–1.0)
Monocytes Relative: 8 %
Neutro Abs: 4.2 10*3/uL (ref 1.7–7.7)
Neutrophils Relative %: 42 %

## 2019-08-23 LAB — COMPREHENSIVE METABOLIC PANEL
ALT: 22 U/L (ref 0–44)
AST: 26 U/L (ref 15–41)
Albumin: 4.1 g/dL (ref 3.5–5.0)
Alkaline Phosphatase: 79 U/L (ref 38–126)
Anion gap: 13 (ref 5–15)
BUN: 22 mg/dL (ref 8–23)
CO2: 25 mmol/L (ref 22–32)
Calcium: 9.4 mg/dL (ref 8.9–10.3)
Chloride: 101 mmol/L (ref 98–111)
Creatinine, Ser: 1.27 mg/dL — ABNORMAL HIGH (ref 0.61–1.24)
GFR calc Af Amer: >60 mL/min (ref >60)
GFR calc non Af Amer: 56 mL/min — ABNORMAL LOW (ref >60)
Glucose, Bld: 112 mg/dL — ABNORMAL HIGH (ref 70–99)
Potassium: 3.7 mmol/L (ref 3.5–5.1)
Sodium: 139 mmol/L (ref 135–145)
Total Bilirubin: 0.9 mg/dL (ref 0.3–1.2)
Total Protein: 8 g/dL (ref 6.5–8.1)

## 2019-08-23 LAB — I-STAT CHEM 8, ED
BUN: 26 mg/dL — ABNORMAL HIGH (ref 8–23)
Calcium, Ion: 1.03 mmol/L — ABNORMAL LOW (ref 1.15–1.40)
Chloride: 101 mmol/L (ref 98–111)
Creatinine, Ser: 1.2 mg/dL (ref 0.61–1.24)
Glucose, Bld: 111 mg/dL — ABNORMAL HIGH (ref 70–99)
HCT: 45 % (ref 39.0–52.0)
Hemoglobin: 15.3 g/dL (ref 13.0–17.0)
Potassium: 3.4 mmol/L — ABNORMAL LOW (ref 3.5–5.1)
Sodium: 141 mmol/L (ref 135–145)
TCO2: 24 mmol/L (ref 22–32)

## 2019-08-23 LAB — RAPID URINE DRUG SCREEN, HOSP PERFORMED
Amphetamines: NOT DETECTED
Barbiturates: NOT DETECTED
Benzodiazepines: NOT DETECTED
Cocaine: NOT DETECTED
Opiates: NOT DETECTED
Tetrahydrocannabinol: POSITIVE — AB

## 2019-08-23 LAB — URINALYSIS, ROUTINE W REFLEX MICROSCOPIC
Bacteria, UA: NONE SEEN
Bilirubin Urine: NEGATIVE
Glucose, UA: NEGATIVE mg/dL
Hgb urine dipstick: NEGATIVE
Ketones, ur: NEGATIVE mg/dL
Nitrite: NEGATIVE
Protein, ur: NEGATIVE mg/dL
Specific Gravity, Urine: 1.04 — ABNORMAL HIGH (ref 1.005–1.030)
pH: 6 (ref 5.0–8.0)

## 2019-08-23 LAB — PROTIME-INR
INR: 1 (ref 0.8–1.2)
Prothrombin Time: 13 seconds (ref 11.4–15.2)

## 2019-08-23 LAB — CBC
HCT: 42.5 % (ref 39.0–52.0)
Hemoglobin: 13.8 g/dL (ref 13.0–17.0)
MCH: 28.3 pg (ref 26.0–34.0)
MCHC: 32.5 g/dL (ref 30.0–36.0)
MCV: 87.1 fL (ref 80.0–100.0)
Platelets: 245 10*3/uL (ref 150–400)
RBC: 4.88 MIL/uL (ref 4.22–5.81)
RDW: 17.4 % — ABNORMAL HIGH (ref 11.5–15.5)
WBC: 10 10*3/uL (ref 4.0–10.5)
nRBC: 0 % (ref 0.0–0.2)

## 2019-08-23 LAB — PROCALCITONIN: Procalcitonin: <0.1 ng/mL

## 2019-08-23 LAB — VITAMIN B12: Vitamin B-12: 374 pg/mL (ref 180–914)

## 2019-08-23 LAB — SARS CORONAVIRUS 2 BY RT PCR (HOSPITAL ORDER, PERFORMED IN ~~LOC~~ HOSPITAL LAB): SARS Coronavirus 2: NEGATIVE

## 2019-08-23 LAB — TROPONIN I (HIGH SENSITIVITY): Troponin I (High Sensitivity): 6 ng/L (ref <18)

## 2019-08-23 LAB — APTT: aPTT: 25 seconds (ref 24–36)

## 2019-08-23 MED ORDER — LACTATED RINGERS IV BOLUS
1000.0000 mL | Freq: Once | INTRAVENOUS | Status: AC
  Administered 2019-08-23: 1000 mL via INTRAVENOUS

## 2019-08-23 MED ORDER — POLYETHYLENE GLYCOL 3350 17 G PO PACK: 17.0000 g | PACK | Freq: Every day | ORAL | Status: DC | PRN

## 2019-08-23 MED ORDER — PANTOPRAZOLE SODIUM 20 MG PO TBEC
20.0000 mg | DELAYED_RELEASE_TABLET | Freq: Every day | ORAL | Status: DC
  Administered 2019-08-24 – 2019-08-27 (×4): 20 mg via ORAL
  Filled 2019-08-23 (×4): qty 1

## 2019-08-23 MED ORDER — POTASSIUM CHLORIDE CRYS ER 20 MEQ PO TBCR
40.0000 meq | EXTENDED_RELEASE_TABLET | Freq: Once | ORAL | Status: AC
  Administered 2019-08-23: 40 meq via ORAL
  Filled 2019-08-23: qty 2

## 2019-08-23 MED ORDER — ENOXAPARIN SODIUM 40 MG/0.4ML ~~LOC~~ SOLN
40.0000 mg | SUBCUTANEOUS | Status: DC
  Administered 2019-08-24 – 2019-08-27 (×4): 40 mg via SUBCUTANEOUS
  Filled 2019-08-23 (×4): qty 0.4

## 2019-08-23 MED ORDER — ATORVASTATIN CALCIUM 80 MG PO TABS
80.0000 mg | ORAL_TABLET | Freq: Every day | ORAL | Status: DC
  Administered 2019-08-24 – 2019-08-27 (×4): 80 mg via ORAL
  Filled 2019-08-23 (×4): qty 1

## 2019-08-23 MED ORDER — IOHEXOL 350 MG/ML SOLN
100.0000 mL | Freq: Once | INTRAVENOUS | Status: AC | PRN
  Administered 2019-08-23: 100 mL via INTRAVENOUS

## 2019-08-23 MED ORDER — SODIUM CHLORIDE 0.9% FLUSH
3.0000 mL | Freq: Once | INTRAVENOUS | Status: AC
  Administered 2019-08-23: 3 mL via INTRAVENOUS

## 2019-08-23 MED ORDER — CLOPIDOGREL BISULFATE 75 MG PO TABS
75.0000 mg | ORAL_TABLET | Freq: Every day | ORAL | Status: DC
  Administered 2019-08-24 – 2019-08-27 (×4): 75 mg via ORAL
  Filled 2019-08-23 (×4): qty 1

## 2019-08-23 NOTE — H&P (Signed)
Date: 08/23/2019               Patient Name:  Stephen [name] MRN: [id number]  DOB: January 18, 1951 Age / Sex: 69 y.o., male   PCP: Mitzi Hansen, MD         Medical Service: Internal Medicine Teaching Service         Attending Physician: Dr. Velna Ochs, MD    First Contact: Dr. Kai Levins Pager: [phone]  Second Contact: Dr. Sherry Ruffing Pager: [phone]       After Hours (After 5p/  First Contact Pager: [phone]  weekends / holidays): Second Contact Pager: [phone]   Chief Complaint: Altered Mental Status   History of Present Illness: Stephen [name] is a 69 year old male with a history of hypertension, hyperlipidemia, PVD, and prior CVAs (right lacunar infarct in 2014 followed by left MCA territory infarct in December of 2020) with residual right sided weakness, ambulates with a cane who presented from home with his wife after an episode of encephalopathy.   The wife, Stephen [name], reports that they were together watching watching TV and Stephen [name] reports that he wasn't feeling well and suddenly "broke out in sweats." Following this episode, he has been more altered than usual. At the time of our evaluation, the wife noticed some improvement to his encephalopathy but not usually back to his baseline. Typically, he is more conversational, ambulatory and does most daily activities himself. His appetite is good and also good fluid intake. His wife also reports that patient does not have any memory issues. His wife states that patient does smoke Marijuana occasionally and the last time he smoked was this morning. Patient did not have any problems with marijuana used in the past. Patient denies chest pain, SOB, headache, dysuria, abdominal pain, extremities pain, fever, or N/V.   Patient was recently admitted on July 29, 2019 for TIA vs recrudescence of prior stroke in the setting of medication induced hypotension. Patient was at [facility] for rehab and went home on 08/14/2019. Patient currently  lives at home and his wife comes over every morning to take care of him. She also helps him with his medications and patient took all of his medications today as instructed. Looking at his active medications that his wife brought to the hospital, unsure if patient is still taking Triamterene-HCTZ and Amlodipine, which were supposed to be d/c on his last hospital admission.   ED course: Patient is minimally responsive and appears tired. He is AOx3. Electrolytes unremarkable. No clear sign of infection, no leukocytosis, and afebrile. CXR shows no acute change. CT head negative for acute infarct and shows extensive chronic small vessel ischemic disease. 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 Pending UA, UDS, EEG, troponin, blood culture. Patient is admitted for work up for [place].    Meds:  No outpatient medications have been marked as taking for the 08/23/19 encounter [facility] Encounter).     Allergies: Allergies as of 08/23/2019 - Review Complete 08/23/2019  Allergen Reaction Noted  . Aspirin Other (See Comments)   . Lisinopril Other (See Comments) 09/28/2011   Past Medical History:  Diagnosis Date  . Alcohol abuse   . Arthropathy    shoulder  . Chest pain   . Chronic cough   . Foot pain    bilateral  . Hyperlipidemia   . Hypertension   . Intermittent claudication (HCC)    bilateral  . Latent tuberculosis   .  Lumbago   . Shoulder pain    Family History  Problem Relation Age of Onset  . Other Other        there is no specific hx of coronary arterial disease/He does not know his parents history  . Cancer Sister        unknown type    Social History:  Alcohol: no Smoking: no Smoke marijuana occasionally. Last use time was this morning  Review of Systems: A complete ROS was negative except as per HPI.   Physical Exam: Blood pressure (!) 129/70, pulse 69, temperature 97.6 F (36.4 C), resp. rate 14, weight 70 kg, SpO2 100  %. Physical Exam Constitutional:      General: He is not in acute distress.    Comments: Somnolent, minimally responsive, appears tired.   HENT:     Head: Normocephalic.  Eyes:     General: No scleral icterus.    Extraocular Movements: Extraocular movements intact.     Pupils: Pupils are equal, round, and reactive to light.  Cardiovascular:     Rate and Rhythm: Normal rate and regular rhythm.     Heart sounds: Normal heart sounds. No murmur heard.   Pulmonary:     Effort: Pulmonary effort is normal. No respiratory distress.     Breath sounds: Normal breath sounds. No wheezing.  Abdominal:     General: Bowel sounds are normal. There is no distension.     Tenderness: There is no abdominal tenderness. There is no guarding.     Comments: Slightly rigid abdomen. No tenderness to palpation  Musculoskeletal:     Right lower leg: No edema.     Left lower leg: No edema.     Comments: No open wound noted on bilateral feet.   Skin:    General: Skin is dry.     Coloration: Skin is not jaundiced.  Neurological:     Comments: Unable to do neuro exam due to patient's AMS     EKG: personally reviewed my interpretation is normal sinus rhythm with rate of 72.   CXR: No acute abnormality of the lungs in AP portable projection  CT head:  1. No evidence of acute intracranial abnormality. 2. Extensive chronic small vessel ischemic disease. 3. Unchanged 1.5 cm right frontal meningioma.  CTA:  1. 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 2. Hypoplastic right V3 and V4 segments. 3. No significant stenosis of the dominant left vertebral artery in the neck or head 4. Normal CTA appearance of the carotid arteries in the neck bilaterally. 5. Moderate diffuse small vessel disease throughout the circle-of-Willis without a significant proximal stenosis, aneurysm, or branch vessel occlusion. 6. Multilevel spondylosis of the cervical spine. 7. Aortic  Atherosclerosis (ICD10-I70.0) and Emphysema (ICD10-J43.9).  CBC Latest Ref Rng & Units 08/23/2019 08/23/2019 07/30/2019  WBC 4.0 - 10.5 K/uL - 10.0 8.6  Hemoglobin 13.0 - 17.0 g/dL 15.3 13.8 13.1  Hematocrit 39 - 52 % 45.0 42.5 38.7(L)  Platelets 150 - 400 K/uL - 245 209   BMP Latest Ref Rng & Units 08/23/2019 08/23/2019 07/31/2019  Glucose 70 - 99 mg/dL 111(H) 112(H) 130(H)  BUN 8 - 23 mg/dL 26(H) 22 16  Creatinine 0.61 - 1.24 mg/dL 1.20 1.27(H) 1.26(H)  BUN/Creat Ratio 10 - 24 - - -  Sodium 135 - 145 mmol/L 141 139 135  Potassium 3.5 - 5.1 mmol/L 3.4(L) 3.7 3.9  Chloride 98 - 111 mmol/L 101 101 102  CO2  22 - 32 mmol/L - 25 23  Calcium 8.9 - 10.3 mg/dL - 9.4 9.1    Assessment & Plan by Problem: Principal Problem:   Acute encephalopathy Active Problems:   Hyperlipidemia   Essential hypertension   Peripheral vascular disease (HCC)   GERD (gastroesophageal reflux disease)   History of CVA (cerebrovascular accident)   Hypokalemia   Right arm and right-sided back pain   Balance problem   Neurocognitive deficits   Stephen [name] is a 69 year old male with a history of hypertension, hyperlipidemia, PVD, and prior CVAs (right lacunar infarct in 2014 followed by left MCA territory infarct in December of 2020) with residual right sided weakness, ambulates with a cane who presented from home with his wife after an episode of encephalopathy.    Acute encephalopathy: Etiology include seizure vs delirium vs drug use. Unsure of his mental status baseline. Electrolytes are unremarkable, which is unlike to cause his AMS. Patient has no clear sign of infection with no leukocytosis, afebrile, CXR shows no acute dx and Procal < 0.10. Pending UA and blood culture. CVA is unlikely given remarkable CT head. 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 Seizure can not be excluded so EEG was ordered. Unsure if Marijuana can be a possible underlying cause. UDS  pending.  - Neuro is following. Appreciate neuro recommendations - Pending UA and blood culture - Pending Troponin - Tele monitoring - Pending EEG - Pending UDS - Pending B12 - NPO at this time.  Await SLP eval   AKI Baseline creatine 1-1.1. Current Creatine 1.27. Patient was given 1L of LR bolus. - Recheck BMP in AM - Hold HCTZ   HTN BP 130/67 on admission. Unsure if patient is still taking Triamterene-HCTZ and Amlodipine, which were supposed to be d/c on his last hospital admission.  - Continue to hold HCTZ and Amlodipine   HLD - Continue Lipitor 80 mg   History of CVA - Continue Plavix 75 mg  Dispo: Admit patient to Observation with expected length of stay less than 2 midnights.  Signed: Gaylan Gerold, DO 08/23/2019, 4:43 PM  Pager: [phone] After 5pm on weekdays and 1pm on weekends: On Call pager: [phone]

## 2019-08-23 NOTE — Consult Note (Signed)
NEURO HOSPITALIST CONSULT NOTE   Requesting Physician: Dr. Francia Greaves      Chief Complaint: EMS Code Stroke  History obtained from:  Chart and EMS  HPI:                                                                                                                                         Stephen [name] is an 69 y.o. male with a history of  HTN, HLD, PVD, and prior Stroke: 2020: Right posterior corona radiata and lentiform nucleus infarct. 2014: Small acute perforator infarct involving the posterior left putamen and posterior left corona radiata. Most recently he was admitted in July for TIA versus recrudsence of prior stroke symptoms in setting of hypotension. Currently on plavix.  He presented today as an EMS Code Stroke with reported aphasia. Last known well 1:30PM. Initial NIHSS 16, followed by NIHSS 12 about 15 minutes later. Minimal attempts at speech, able to lift all extremities with genaralized weakness and drift.   Head CT no acute findings, extensive chronic small vessel disease and unchanged meningioma. CTA Head and Neck and CT Perfusion also completed, which showed known right vertebral artery occlusion, no acute occlusions identified. Mismatch Volume 79mL. ASPECTS was not scored due to chronic ischemia.   Date last known well: Date: 08/23/2019 Time last known well: Time: 13:30 tPA Given: No: Not felt to be a diagnosis of stroke  Past Medical History:  Diagnosis Date   Alcohol abuse    Arthropathy    shoulder   Chest pain    Chronic cough    Foot pain    bilateral   Hyperlipidemia    Hypertension    Intermittent claudication (HCC)    bilateral   Latent tuberculosis    Lumbago    Shoulder pain     Past Surgical History:  Procedure Laterality Date   APPENDECTOMY     HAMMER TOE SURGERY     right 2nd. and 5th   LAMINECTOMY  1989-1991   left lumbar     Family History  Problem Relation Age of Onset   Other Other        there is  no specific hx of coronary arterial disease/He does not know his parents history   Cancer Sister        unknown type         Social History:  reports that he quit smoking about 29 years ago. His smoking use included cigarettes. He has never used smokeless tobacco. He reports current drug use. Frequency: 1.00 time per week. Drug: Marijuana. He reports that he does not drink alcohol.  Allergies:  Allergies  Allergen Reactions   Aspirin Other (See Comments)    Stomach pain/ dizziness   Lisinopril Other (See Comments)    Acute kidney insufficiency in 2013 (Cr elevated from 1.00 to 1.79    Medications:  I have reviewed the patient's current medications.  ROS:                                                                                                                                       History obtained from unobtainable from patient due to mental status  General Examination:                                                                                                      Blood pressure 127/76, pulse 74, temperature 97.6 F (36.4 C), resp. rate 16, weight 70 kg, SpO2 100 %.  Physical Exam  Constitutional: Appears well-developed and well-nourished.  Psych: Minimal attempts at speech Eyes: Normal external eye and conjunctiva. HENT: Normocephalic, no lesions, without obvious abnormality.   Musculoskeletal-no joint tenderness, deformity or swelling Cardiovascular: Normal rate and regular rhythm.  Respiratory: Effort normal, non-labored breathing saturations WNL GI: Soft.  No distension. There is no tenderness.  Skin: WDI  Neurological Examination Mental Status: Alert, minimal attempts at speech, said "yeah" to the ED Provider. Following basic commands to lift extremities.  Cranial Nerves: II:  Visual fields grossly normal,  III,IV, VI: ptosis not present,  extra-ocular motions intact bilaterally,  V,VII: smile symmetric, facial light touch sensation normal bilaterally VIII: hearing normal bilaterally IX,X: uvula rises symmetrically XI: bilateral shoulder shrug Motor: Generalized weakness, able to lift all extremities to gravity and then drift back down.  Sensory: light touch intact throughout, bilaterally   Initial NIHSS 16, followed by NIHSS 12 about 15 minutes later.   Lab Results: Basic Metabolic Panel: Recent Labs  Lab 08/23/19 1438 08/23/19 1443  NA 139 141  K 3.7 3.4*  CL 101 101  CO2 25  --   GLUCOSE 112* 111*  BUN 22 26*  CREATININE 1.27* 1.20  CALCIUM 9.4  --     CBC: Recent Labs  Lab 08/23/19 1438 08/23/19 1443  WBC 10.0  --   NEUTROABS 4.2  --   HGB 13.8 15.3  HCT 42.5 45.0  MCV 87.1  --   PLT 245  --     Lipid Panel: No results for input(s): CHOL, TRIG, HDL, CHOLHDL, VLDL, LDLCALC in the last 168 hours.  CBG: No results for input(s): GLUCAP in the last 168 hours.  Imaging: CT Code Stroke CTA Head W/WO contrast  Result Date: 08/23/2019 CLINICAL DATA:  Code stroke.  Aphasia. EXAM: CT ANGIOGRAPHY HEAD AND NECK CT PERFUSION BRAIN TECHNIQUE: Multidetector CT imaging of the head  and neck was performed using the standard protocol during bolus administration of intravenous contrast. Multiplanar CT image reconstructions and MIPs were obtained to evaluate the vascular anatomy. Carotid stenosis measurements (when applicable) are obtained utilizing NASCET criteria, using the distal internal carotid diameter as the denominator. Multiphase CT imaging of the brain was performed following IV bolus contrast injection. Subsequent parametric perfusion maps were calculated using RAPID software. CONTRAST:  163mL OMNIPAQUE IOHEXOL 350 MG/ML SOLN COMPARISON:  None. FINDINGS: CTA NECK FINDINGS Aortic arch: A 3 vessel arch configuration is present. Atherosclerotic changes are present at the arch without significant stenosis or  aneurysm. Right carotid system: The right common carotid artery is within normal limits. Bifurcation demonstrates minimal atherosclerotic change without significant stenosis. The cervical right ICA is normal. Left carotid system: The left common carotid artery is within normal limits. Bifurcation is unremarkable. Cervical left ICA is normal. Vertebral arteries: The left vertebral artery is the dominant vessel. Mild narrowing is present at the origin of the left vertebral artery from the subclavian without a significant stenosis relative to the more distal vessel. The right vertebral artery is occluded, reconstituted at the skull base. Skeleton: Multilevel degenerative changes are present cervical spine. Uncovertebral and facet hypertrophy contribute to foraminal narrowing at C3-4 bilaterally. Other neck: The soft tissues the neck are unremarkable. No significant adenopathy is present. Thyroid is normal. Salivary glands and ducts are within normal limits. Upper chest: Centrilobular emphysematous changes are present. No focal nodule, mass, or airspace disease is present. Thoracic inlet is within normal limits. Review of the MIP images confirms the above findings CTA HEAD FINDINGS Anterior circulation: Atherosclerotic calcifications are present within the cavernous internal carotid arteries bilaterally. No significant stenosis is present through the ICA termini. The right A1 segment is aplastic. Both A2 segments fill from the left with a patent anterior communicating artery. M1 segments are normal. MCA bifurcations are within normal limits bilaterally. Segmental narrowing of MCA branch vessels is noted bilaterally without a significant proximal stenosis or occlusion. Posterior circulation: The left vertebral artery is the dominant vessel. The V3 and V4 segment are hypoplastic on the right. PICA origin is visualized. No flow is present below the dural margin. The basilar artery is normal. Both posterior cerebral  arteries originate from basilar tip. Venous sinuses: Dural sinuses are patent. The straight sinus and deep cerebral veins are patent. Left transverse sinus is dominant. Cortical veins are within normal limits. Anatomic variants: None Review of the MIP images confirms the above findings CT Brain Perfusion Findings: ASPECTS: Not scored due to chronic ischemia. CBF (<30%) Volume: 39mL Perfusion (Tmax>6.0s) volume: 83mL Mismatch Volume: 29mL Infarction Location:n/a IMPRESSION: 1. 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 2. Hypoplastic right V3 and V4 segments. 3. No significant stenosis of the dominant left vertebral artery in the neck or head 4. Normal CTA appearance of the carotid arteries in the neck bilaterally. 5. Moderate diffuse small vessel disease throughout the circle-of-Willis without a significant proximal stenosis, aneurysm, or branch vessel occlusion. 6. Multilevel spondylosis of the cervical spine. 7. Aortic Atherosclerosis (ICD10-I70.0) and Emphysema (ICD10-J43.9). Electronically Signed   By: San Morelle M.D.   On: 08/23/2019 15:25   CT Code Stroke CTA Neck W/WO contrast  Result Date: 08/23/2019 CLINICAL DATA:  Code stroke.  Aphasia. EXAM: CT ANGIOGRAPHY HEAD AND NECK CT PERFUSION BRAIN TECHNIQUE: Multidetector CT imaging of the head and neck was performed using the standard protocol during bolus administration of intravenous contrast. Multiplanar CT  image reconstructions and MIPs were obtained to evaluate the vascular anatomy. Carotid stenosis measurements (when applicable) are obtained utilizing NASCET criteria, using the distal internal carotid diameter as the denominator. Multiphase CT imaging of the brain was performed following IV bolus contrast injection. Subsequent parametric perfusion maps were calculated using RAPID software. CONTRAST:  177mL OMNIPAQUE IOHEXOL 350 MG/ML SOLN COMPARISON:  None. FINDINGS: CTA NECK FINDINGS Aortic arch: A 3  vessel arch configuration is present. Atherosclerotic changes are present at the arch without significant stenosis or aneurysm. Right carotid system: The right common carotid artery is within normal limits. Bifurcation demonstrates minimal atherosclerotic change without significant stenosis. The cervical right ICA is normal. Left carotid system: The left common carotid artery is within normal limits. Bifurcation is unremarkable. Cervical left ICA is normal. Vertebral arteries: The left vertebral artery is the dominant vessel. Mild narrowing is present at the origin of the left vertebral artery from the subclavian without a significant stenosis relative to the more distal vessel. The right vertebral artery is occluded, reconstituted at the skull base. Skeleton: Multilevel degenerative changes are present cervical spine. Uncovertebral and facet hypertrophy contribute to foraminal narrowing at C3-4 bilaterally. Other neck: The soft tissues the neck are unremarkable. No significant adenopathy is present. Thyroid is normal. Salivary glands and ducts are within normal limits. Upper chest: Centrilobular emphysematous changes are present. No focal nodule, mass, or airspace disease is present. Thoracic inlet is within normal limits. Review of the MIP images confirms the above findings CTA HEAD FINDINGS Anterior circulation: Atherosclerotic calcifications are present within the cavernous internal carotid arteries bilaterally. No significant stenosis is present through the ICA termini. The right A1 segment is aplastic. Both A2 segments fill from the left with a patent anterior communicating artery. M1 segments are normal. MCA bifurcations are within normal limits bilaterally. Segmental narrowing of MCA branch vessels is noted bilaterally without a significant proximal stenosis or occlusion. Posterior circulation: The left vertebral artery is the dominant vessel. The V3 and V4 segment are hypoplastic on the right. PICA origin  is visualized. No flow is present below the dural margin. The basilar artery is normal. Both posterior cerebral arteries originate from basilar tip. Venous sinuses: Dural sinuses are patent. The straight sinus and deep cerebral veins are patent. Left transverse sinus is dominant. Cortical veins are within normal limits. Anatomic variants: None Review of the MIP images confirms the above findings CT Brain Perfusion Findings: ASPECTS: Not scored due to chronic ischemia. CBF (<30%) Volume: 51mL Perfusion (Tmax>6.0s) volume: 15mL Mismatch Volume: 62mL Infarction Location:n/a IMPRESSION: 1. 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 2. Hypoplastic right V3 and V4 segments. 3. No significant stenosis of the dominant left vertebral artery in the neck or head 4. Normal CTA appearance of the carotid arteries in the neck bilaterally. 5. Moderate diffuse small vessel disease throughout the circle-of-Willis without a significant proximal stenosis, aneurysm, or branch vessel occlusion. 6. Multilevel spondylosis of the cervical spine. 7. Aortic Atherosclerosis (ICD10-I70.0) and Emphysema (ICD10-J43.9). Electronically Signed   By: San Morelle M.D.   On: 08/23/2019 15:25   CT Code Stroke Cerebral Perfusion with contrast  Result Date: 08/23/2019 CLINICAL DATA:  Code stroke.  Aphasia. EXAM: CT ANGIOGRAPHY HEAD AND NECK CT PERFUSION BRAIN TECHNIQUE: Multidetector CT imaging of the head and neck was performed using the standard protocol during bolus administration of intravenous contrast. Multiplanar CT image reconstructions and MIPs were obtained to evaluate the vascular anatomy. Carotid stenosis measurements (when applicable)  are obtained utilizing NASCET criteria, using the distal internal carotid diameter as the denominator. Multiphase CT imaging of the brain was performed following IV bolus contrast injection. Subsequent parametric perfusion maps were calculated using RAPID  software. CONTRAST:  152mL OMNIPAQUE IOHEXOL 350 MG/ML SOLN COMPARISON:  None. FINDINGS: CTA NECK FINDINGS Aortic arch: A 3 vessel arch configuration is present. Atherosclerotic changes are present at the arch without significant stenosis or aneurysm. Right carotid system: The right common carotid artery is within normal limits. Bifurcation demonstrates minimal atherosclerotic change without significant stenosis. The cervical right ICA is normal. Left carotid system: The left common carotid artery is within normal limits. Bifurcation is unremarkable. Cervical left ICA is normal. Vertebral arteries: The left vertebral artery is the dominant vessel. Mild narrowing is present at the origin of the left vertebral artery from the subclavian without a significant stenosis relative to the more distal vessel. The right vertebral artery is occluded, reconstituted at the skull base. Skeleton: Multilevel degenerative changes are present cervical spine. Uncovertebral and facet hypertrophy contribute to foraminal narrowing at C3-4 bilaterally. Other neck: The soft tissues the neck are unremarkable. No significant adenopathy is present. Thyroid is normal. Salivary glands and ducts are within normal limits. Upper chest: Centrilobular emphysematous changes are present. No focal nodule, mass, or airspace disease is present. Thoracic inlet is within normal limits. Review of the MIP images confirms the above findings CTA HEAD FINDINGS Anterior circulation: Atherosclerotic calcifications are present within the cavernous internal carotid arteries bilaterally. No significant stenosis is present through the ICA termini. The right A1 segment is aplastic. Both A2 segments fill from the left with a patent anterior communicating artery. M1 segments are normal. MCA bifurcations are within normal limits bilaterally. Segmental narrowing of MCA branch vessels is noted bilaterally without a significant proximal stenosis or occlusion. Posterior  circulation: The left vertebral artery is the dominant vessel. The V3 and V4 segment are hypoplastic on the right. PICA origin is visualized. No flow is present below the dural margin. The basilar artery is normal. Both posterior cerebral arteries originate from basilar tip. Venous sinuses: Dural sinuses are patent. The straight sinus and deep cerebral veins are patent. Left transverse sinus is dominant. Cortical veins are within normal limits. Anatomic variants: None Review of the MIP images confirms the above findings CT Brain Perfusion Findings: ASPECTS: Not scored due to chronic ischemia. CBF (<30%) Volume: 24mL Perfusion (Tmax>6.0s) volume: 21mL Mismatch Volume: 85mL Infarction Location:n/a IMPRESSION: 1. 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 2. Hypoplastic right V3 and V4 segments. 3. No significant stenosis of the dominant left vertebral artery in the neck or head 4. Normal CTA appearance of the carotid arteries in the neck bilaterally. 5. Moderate diffuse small vessel disease throughout the circle-of-Willis without a significant proximal stenosis, aneurysm, or branch vessel occlusion. 6. Multilevel spondylosis of the cervical spine. 7. Aortic Atherosclerosis (ICD10-I70.0) and Emphysema (ICD10-J43.9). Electronically Signed   By: San Morelle M.D.   On: 08/23/2019 15:25   DG Chest Port 1 View  Result Date: 08/23/2019 CLINICAL DATA:  AMS EXAM: PORTABLE CHEST 1 VIEW COMPARISON:  07/02/2019 FINDINGS: The heart size and mediastinal contours are within normal limits. Both lungs are clear. The visualized skeletal structures are unremarkable. IMPRESSION: No acute abnormality of the lungs in AP portable projection. Electronically Signed   By: Eddie Candle M.D.   On: 08/23/2019 15:55   CT HEAD CODE STROKE WO CONTRAST  Result Date: 08/23/2019 CLINICAL DATA:  Code stroke.  Aphasia. EXAM: CT HEAD WITHOUT CONTRAST TECHNIQUE: Contiguous axial images were  obtained from the base of the skull through the vertex without intravenous contrast. COMPARISON:  Head MRI 07/29/2019 FINDINGS: Brain: No acute cortically based infarct, intracranial hemorrhage, mass, midline shift, or extra-axial fluid collection is identified. Patchy to confluent hypodensities in the cerebral white matter bilaterally are nonspecific but compatible with extensive chronic small vessel ischemic disease. Chronic infarcts are again seen in the deep gray nuclei and deep cerebral white matter bilaterally as well as in the right cerebellum. There is moderate cerebral atrophy. A 1.5 cm partially calcified extra-axial mass over the right frontal convexity is unchanged without associated edema. Vascular: Calcified atherosclerosis at the skull base. No hyperdense vessel. Skull: No fracture or suspicious osseous lesion. Sinuses/Orbits: Minimal mucosal thickening in the included paranasal sinuses. Clear mastoid air cells. Unremarkable included orbits. Other: None. ASPECTS [facility] Stroke Program Early CT Score) Not scored due to extensive chronic ischemia. IMPRESSION: 1. No evidence of acute intracranial abnormality. 2. Extensive chronic small vessel ischemic disease. 3. Unchanged 1.5 cm right frontal meningioma. These results were communicated to Dr. Lorrin Goodell at 2:51 pm on 08/23/2019 by text page via the [facility] messaging system. Electronically Signed   By: Logan Bores M.D.   On: 08/23/2019 14:51    Assessment: 69 y.o. male with history of HTN, HLD, PVD and stroke.   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   2014: Small acute perforator infarct involving the posterior left putamen and posterior left corona radiata.   July 2021TIA versus recrudsence of prior stroke symptoms in setting of hypotension. Currently on plavix.  Presented today as a code stroke with minimal attempts at speech, able to lift all extremities with genaralized weakness and drift. Last known well  1:30PM. Initial NIHSS 16, followed by NIHSS 12 about 15 minutes later. Head CT no acute findings CTA Head and Neck and CT Perfusion also completed, which showed known right vertebral artery occlusion, no acute occlusions identified. Mismatch Volume 19mL. Stroke was felt to be low on the differential diagnosis and the code stroke was cancelled. Plan for altered mental status/encephalopathy work-up.    Impression:    Encephalopathy  Stroke felt to be low on differential, code stroke cancelled    Recommendations:   Encephalopathy Work-up recommendations discussed with Dr. Lorrin Goodell:  TSH, B12, Folate, Ammonia, Drug Screen  Chest X-ray, UA and Culture, Lactate, Procalcitonin   MRI brain   --PT consult, OT consult, Speech consult --Telemetry monitoring --Frequent neuro checks --NPO until passes stroke swallow screen  Stroke Prevention (history of previous strokes): Currently on Plavix for stroke prevention. Also on Lipitor 80mg  for hyperlipidemia . Admitting service is clarifying his blood pressure medications.   Additional review and recommendations to follow from attending neurologist  Gwenyth Bouillon FNP-C [facility]  08/23/2019, 5:41 PM

## 2019-08-23 NOTE — ED Triage Notes (Signed)
Pt arrived via EMS from home wife reporting ams, lkw [id number].

## 2019-08-23 NOTE — Progress Notes (Signed)
Patient arrived to [id number]. A&O x3, not oriented to situation. States no pain. VSS WNL. Has medications at bedside in green bag. Blue jean pants on but does not want to take them off at this time. Patient uses a cane at baseline. Call light within reach, educated on how to use it. Bed in lowest position with call light on. Urinal at bedside. [place]

## 2019-08-23 NOTE — Code Documentation (Signed)
Patient is 69 year old male who presents from home.  According to his wife he had been acting normal until 1330, where he was found to be aphasic and confused.  Patient appeared to be very altered according to the EMS.  Patient was just recently treated for a stroke 01/01/2019.  Code stroke was activated in the field.  Patient presented with severe aphasia, which is not his normal baseline.  He has a history of ETOH abuse, HTN, and HLD.  According to how this patient presented the Dr. Clarisa Fling the code stroke because he felt it was metabolic.

## 2019-08-23 NOTE — ED Provider Notes (Signed)
[name] EMERGENCY DE[facility]MENT Provider Note   CSN: [id number] Arrival date & time: 08/23/19  1432  An emergency department physician performed an initial assessment on this suspected stroke patient at 1434.  History Chief Complaint  Patient presents with  . Code Stroke    Stephen [name] is a 69 y.o. male.  69 year old male with prior medical history detailed below presents for evaluation of AMS.  Patient was apparently in his normal state of health until approximate 130 this afternoon.  Family reportedly found at that time to be significantly confused more so than his baseline.  EMS reports that he was very altered on their initial evaluation.  Code stroke was initiated in the field.  Neuro team has evaluated the patient on arrival.  They do not feel that he is a candidate for acute neuro intervention.  Patient is unable to provide significant history given his mental status.  Level 5 caveat secondary to same.  The history is provided by the patient and medical records.  Altered Mental Status Presenting symptoms: confusion, disorientation and partial responsiveness   Severity:  Moderate Most recent episode:  Today Episode history:  Single Timing:  Constant Progression:  Partially resolved Chronicity:  New      Past Medical History:  Diagnosis Date  . Alcohol abuse   . Arthropathy    shoulder  . Chest pain   . Chronic cough   . Foot pain    bilateral  . Hyperlipidemia   . Hypertension   . Intermittent claudication (HCC)    bilateral  . Latent tuberculosis   . Lumbago   . Shoulder pain     Patient Active Problem List   Diagnosis Date Noted  . Neurocognitive deficits 08/04/2019  . Weakness 07/30/2019  . Acute right-sided weakness 07/30/2019  . Hypotension due to drugs   . Balance problem 07/28/2019  . Lung nodule 02/26/2019  . Acute left-sided muscle weakness 02/18/2019  . CVA (cerebral vascular accident) ([place]) 01/01/2019  . Right arm  and right-sided back pain 12/09/2017  . Tubular adenoma of colon 12/09/2017  . Hemorrhoids 07/06/2015  . Prediabetes 03/12/2013  . Healthcare maintenance 12/19/2012  . Hypokalemia 09/19/2012  . History of CVA (cerebrovascular accident) 09/08/2012  . GERD (gastroesophageal reflux disease) 01/28/2012  . Chronic back pain s/p L5-S1 laminectomy 05/09/2011  . Chest pain 06/02/2007  . Peripheral vascular disease ([place]) 12/10/2006  . Hyperlipidemia 06/12/2006  . Essential hypertension 06/12/2006    Past Surgical History:  Procedure Laterality Date  . APPENDECTOMY    . HAMMER TOE SURGERY     right 2nd. and 5th  . LAMINECTOMY  [id number]   left lumbar        Family History  Problem Relation Age of Onset  . Other Other        there is no specific hx of coronary arterial disease/He does not know his parents history  . Cancer Sister        unknown type    Social History   Tobacco Use  . Smoking status: Former Smoker    Types: Cigarettes    Quit date: 01/29/1990    Years since quitting: 29.5  . Smokeless tobacco: Never Used  Substance Use Topics  . Alcohol use: No    Alcohol/week: 0.0 standard drinks    Comment: quit alcohol approximately 20 years ago  . Drug use: Yes    Frequency: 1.0 times per week    Types: Marijuana    Comment: marijuana  use about once a month. Former cocaine use. quit in the 1980's    Home Medications Prior to Admission medications   Medication Sig Start Date End Date Taking? Authorizing Provider  atorvastatin (LIPITOR) 80 MG tablet TAKE ONE (1) TABLET BY MOUTH EVERY DAY AT SIX IN THE EVENING 08/14/19   Medina-Vargas, Monina C, NP  bisacodyl (DULCOLAX) 10 MG suppository Place 10 mg rectally daily as needed for moderate constipation.    [provider]  clopidogrel (PLAVIX) 75 MG tablet TAKE 1 TABLET BY MOUTH EVERY DAY 08/14/19   Medina-Vargas, Monina C, NP  gabapentin (NEURONTIN) 400 MG capsule Take 1 capsule (400 mg total) by mouth 3 (three)  times daily. 08/14/19   Medina-Vargas, Monina C, NP  Lidocaine (ASPERCREME LIDOCAINE) 4 % PTCH Apply 1 patch topically daily as needed (Pain).    [provider]  magnesium hydroxide (MILK OF MAGNESIA) 400 MG/5ML suspension Take 30 mLs by mouth daily as needed for mild constipation.    [provider]  pantoprazole (PROTONIX) 20 MG tablet TAKE ONE (1) TABLET BY MOUTH EVERY DAY 08/14/19   Medina-Vargas, Monina C, NP  SODIUM PHOSPHATES RE Place 1 each rectally daily as needed (Constipation).    [provider]    Allergies    Aspirin and Lisinopril  Review of Systems   Review of Systems  Psychiatric/Behavioral: Positive for confusion.  All other systems reviewed and are negative.   Physical Exam Updated Vital Signs BP (!) 129/70   Pulse 69   Temp 97.6 F (36.4 C)   Resp 14   Wt 70 kg   SpO2 100%   BMI 21.52 kg/m   Physical Exam Vitals and nursing note reviewed.  Constitutional:      General: He is not in acute distress.    Appearance: Normal appearance. He is well-developed.  HENT:     Head: Normocephalic and atraumatic.  Eyes:     Conjunctiva/sclera: Conjunctivae normal.     Pupils: Pupils are equal, round, and reactive to light.  Cardiovascular:     Rate and Rhythm: Normal rate and regular rhythm.     Heart sounds: Normal heart sounds.  Pulmonary:     Effort: Pulmonary effort is normal. No respiratory distress.     Breath sounds: Normal breath sounds.  Abdominal:     General: There is no distension.     Palpations: Abdomen is soft.     Tenderness: There is no abdominal tenderness.  Musculoskeletal:        General: No deformity. Normal range of motion.     Cervical back: Normal range of motion and neck supple.  Skin:    General: Skin is warm and dry.  Neurological:     General: No focal deficit present.     Mental Status: He is alert.     Comments: Patient is globally slow.  He is poorly interactive.  He does attempt to follow commands  but his response is slow.  He is without focal deficit.     ED Results / Procedures / Treatments   Labs (all labs ordered are listed, but only abnormal results are displayed) Labs Reviewed  CBC - Abnormal; Notable for the following components:      Result Value   RDW 17.4 (*)    All other components within normal limits  DIFFERENTIAL - Abnormal; Notable for the following components:   Lymphs Abs 4.6 (*)    All other components within normal limits  COMPREHENSIVE METABOLIC PANEL -  Abnormal; Notable for the following components:   Glucose, Bld 112 (*)    Creatinine, Ser 1.27 (*)    GFR calc non Af Amer 56 (*)    All other components within normal limits  I-STAT CHEM 8, ED - Abnormal; Notable for the following components:   Potassium 3.4 (*)    BUN 26 (*)    Glucose, Bld 111 (*)    Calcium, Ion 1.03 (*)    All other components within normal limits  SARS CORONAVIRUS 2 BY RT PCR (HOSPITAL ORDER, [facility])  CULTURE, BLOOD (ROUTINE X 2)  CULTURE, BLOOD (ROUTINE X 2)  PROTIME-INR  APTT  URINALYSIS, ROUTINE W REFLEX MICROSCOPIC  RAPID URINE DRUG SCREEN, HOSP PERFORMED  PROCALCITONIN  VITAMIN B12  CBG MONITORING, ED  TROPONIN I (HIGH SENSITIVITY)    EKG EKG Interpretation  Date/Time:  Sunday August 23 2019 16:00:59 EDT Ventricular Rate:  72 PR Interval:    QRS Duration: 92 QT Interval:  404 QTC Calculation: 443 R Axis:   58 Text Interpretation: Age not entered, assumed to be  69 years old for purpose of ECG interpretation Sinus rhythm Prolonged PR interval RSR' in V1 or V2, probably normal variant Consider left ventricular hypertrophy Confirmed by Dene Gentry [id number]) on 08/23/2019 4:14:39 PM   Radiology CT Code Stroke CTA Head W/WO contrast  Result Date: 08/23/2019 CLINICAL DATA:  Code stroke.  Aphasia. EXAM: CT ANGIOGRAPHY HEAD AND NECK CT PERFUSION BRAIN TECHNIQUE: Multidetector CT imaging of the head and neck was performed using the  standard protocol during bolus administration of intravenous contrast. Multiplanar CT image reconstructions and MIPs were obtained to evaluate the vascular anatomy. Carotid stenosis measurements (when applicable) are obtained utilizing NASCET criteria, using the distal internal carotid diameter as the denominator. Multiphase CT imaging of the brain was performed following IV bolus contrast injection. Subsequent parametric perfusion maps were calculated using RAPID software. CONTRAST:  165mL OMNIPAQUE IOHEXOL 350 MG/ML SOLN COMPARISON:  None. FINDINGS: CTA NECK FINDINGS Aortic arch: A 3 vessel arch configuration is present. Atherosclerotic changes are present at the arch without significant stenosis or aneurysm. Right carotid system: The right common carotid artery is within normal limits. Bifurcation demonstrates minimal atherosclerotic change without significant stenosis. The cervical right ICA is normal. Left carotid system: The left common carotid artery is within normal limits. Bifurcation is unremarkable. Cervical left ICA is normal. Vertebral arteries: The left vertebral artery is the dominant vessel. Mild narrowing is present at the origin of the left vertebral artery from the subclavian without a significant stenosis relative to the more distal vessel. The right vertebral artery is occluded, reconstituted at the skull base. Skeleton: Multilevel degenerative changes are present cervical spine. Uncovertebral and facet hypertrophy contribute to foraminal narrowing at C3-4 bilaterally. Other neck: The soft tissues the neck are unremarkable. No significant adenopathy is present. Thyroid is normal. Salivary glands and ducts are within normal limits. Upper chest: Centrilobular emphysematous changes are present. No focal nodule, mass, or airspace disease is present. Thoracic inlet is within normal limits. Review of the MIP images confirms the above findings CTA HEAD FINDINGS Anterior circulation: Atherosclerotic  calcifications are present within the cavernous internal carotid arteries bilaterally. No significant stenosis is present through the ICA termini. The right A1 segment is aplastic. Both A2 segments fill from the left with a patent anterior communicating artery. M1 segments are normal. MCA bifurcations are within normal limits bilaterally. Segmental narrowing of MCA branch vessels is noted bilaterally without a  significant proximal stenosis or occlusion. Posterior circulation: The left vertebral artery is the dominant vessel. The V3 and V4 segment are hypoplastic on the right. PICA origin is visualized. No flow is present below the dural margin. The basilar artery is normal. Both posterior cerebral arteries originate from basilar tip. Venous sinuses: Dural sinuses are patent. The straight sinus and deep cerebral veins are patent. Left transverse sinus is dominant. Cortical veins are within normal limits. Anatomic variants: None Review of the MIP images confirms the above findings CT Brain Perfusion Findings: ASPECTS: Not scored due to chronic ischemia. CBF (<30%) Volume: 71mL Perfusion (Tmax>6.0s) volume: 2mL Mismatch Volume: 31mL Infarction Location:n/a IMPRESSION: 1. 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 2. Hypoplastic right V3 and V4 segments. 3. No significant stenosis of the dominant left vertebral artery in the neck or head 4. Normal CTA appearance of the carotid arteries in the neck bilaterally. 5. Moderate diffuse small vessel disease throughout the circle-of-Willis without a significant proximal stenosis, aneurysm, or branch vessel occlusion. 6. Multilevel spondylosis of the cervical spine. 7. Aortic Atherosclerosis (ICD10-I70.0) and Emphysema (ICD10-J43.9). Electronically Signed   By: San Morelle M.D.   On: 08/23/2019 15:25   CT Code Stroke CTA Neck W/WO contrast  Result Date: 08/23/2019 CLINICAL DATA:  Code stroke.  Aphasia. EXAM: CT ANGIOGRAPHY  HEAD AND NECK CT PERFUSION BRAIN TECHNIQUE: Multidetector CT imaging of the head and neck was performed using the standard protocol during bolus administration of intravenous contrast. Multiplanar CT image reconstructions and MIPs were obtained to evaluate the vascular anatomy. Carotid stenosis measurements (when applicable) are obtained utilizing NASCET criteria, using the distal internal carotid diameter as the denominator. Multiphase CT imaging of the brain was performed following IV bolus contrast injection. Subsequent parametric perfusion maps were calculated using RAPID software. CONTRAST:  144mL OMNIPAQUE IOHEXOL 350 MG/ML SOLN COMPARISON:  None. FINDINGS: CTA NECK FINDINGS Aortic arch: A 3 vessel arch configuration is present. Atherosclerotic changes are present at the arch without significant stenosis or aneurysm. Right carotid system: The right common carotid artery is within normal limits. Bifurcation demonstrates minimal atherosclerotic change without significant stenosis. The cervical right ICA is normal. Left carotid system: The left common carotid artery is within normal limits. Bifurcation is unremarkable. Cervical left ICA is normal. Vertebral arteries: The left vertebral artery is the dominant vessel. Mild narrowing is present at the origin of the left vertebral artery from the subclavian without a significant stenosis relative to the more distal vessel. The right vertebral artery is occluded, reconstituted at the skull base. Skeleton: Multilevel degenerative changes are present cervical spine. Uncovertebral and facet hypertrophy contribute to foraminal narrowing at C3-4 bilaterally. Other neck: The soft tissues the neck are unremarkable. No significant adenopathy is present. Thyroid is normal. Salivary glands and ducts are within normal limits. Upper chest: Centrilobular emphysematous changes are present. No focal nodule, mass, or airspace disease is present. Thoracic inlet is within normal  limits. Review of the MIP images confirms the above findings CTA HEAD FINDINGS Anterior circulation: Atherosclerotic calcifications are present within the cavernous internal carotid arteries bilaterally. No significant stenosis is present through the ICA termini. The right A1 segment is aplastic. Both A2 segments fill from the left with a patent anterior communicating artery. M1 segments are normal. MCA bifurcations are within normal limits bilaterally. Segmental narrowing of MCA branch vessels is noted bilaterally without a significant proximal stenosis or occlusion. Posterior circulation: The left vertebral artery is the dominant vessel. The  V3 and V4 segment are hypoplastic on the right. PICA origin is visualized. No flow is present below the dural margin. The basilar artery is normal. Both posterior cerebral arteries originate from basilar tip. Venous sinuses: Dural sinuses are patent. The straight sinus and deep cerebral veins are patent. Left transverse sinus is dominant. Cortical veins are within normal limits. Anatomic variants: None Review of the MIP images confirms the above findings CT Brain Perfusion Findings: ASPECTS: Not scored due to chronic ischemia. CBF (<30%) Volume: 58mL Perfusion (Tmax>6.0s) volume: 37mL Mismatch Volume: 28mL Infarction Location:n/a IMPRESSION: 1. 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 2. Hypoplastic right V3 and V4 segments. 3. No significant stenosis of the dominant left vertebral artery in the neck or head 4. Normal CTA appearance of the carotid arteries in the neck bilaterally. 5. Moderate diffuse small vessel disease throughout the circle-of-Willis without a significant proximal stenosis, aneurysm, or branch vessel occlusion. 6. Multilevel spondylosis of the cervical spine. 7. Aortic Atherosclerosis (ICD10-I70.0) and Emphysema (ICD10-J43.9). Electronically Signed   By: San Morelle M.D.   On: 08/23/2019 15:25   CT Code  Stroke Cerebral Perfusion with contrast  Result Date: 08/23/2019 CLINICAL DATA:  Code stroke.  Aphasia. EXAM: CT ANGIOGRAPHY HEAD AND NECK CT PERFUSION BRAIN TECHNIQUE: Multidetector CT imaging of the head and neck was performed using the standard protocol during bolus administration of intravenous contrast. Multiplanar CT image reconstructions and MIPs were obtained to evaluate the vascular anatomy. Carotid stenosis measurements (when applicable) are obtained utilizing NASCET criteria, using the distal internal carotid diameter as the denominator. Multiphase CT imaging of the brain was performed following IV bolus contrast injection. Subsequent parametric perfusion maps were calculated using RAPID software. CONTRAST:  144mL OMNIPAQUE IOHEXOL 350 MG/ML SOLN COMPARISON:  None. FINDINGS: CTA NECK FINDINGS Aortic arch: A 3 vessel arch configuration is present. Atherosclerotic changes are present at the arch without significant stenosis or aneurysm. Right carotid system: The right common carotid artery is within normal limits. Bifurcation demonstrates minimal atherosclerotic change without significant stenosis. The cervical right ICA is normal. Left carotid system: The left common carotid artery is within normal limits. Bifurcation is unremarkable. Cervical left ICA is normal. Vertebral arteries: The left vertebral artery is the dominant vessel. Mild narrowing is present at the origin of the left vertebral artery from the subclavian without a significant stenosis relative to the more distal vessel. The right vertebral artery is occluded, reconstituted at the skull base. Skeleton: Multilevel degenerative changes are present cervical spine. Uncovertebral and facet hypertrophy contribute to foraminal narrowing at C3-4 bilaterally. Other neck: The soft tissues the neck are unremarkable. No significant adenopathy is present. Thyroid is normal. Salivary glands and ducts are within normal limits. Upper chest: Centrilobular  emphysematous changes are present. No focal nodule, mass, or airspace disease is present. Thoracic inlet is within normal limits. Review of the MIP images confirms the above findings CTA HEAD FINDINGS Anterior circulation: Atherosclerotic calcifications are present within the cavernous internal carotid arteries bilaterally. No significant stenosis is present through the ICA termini. The right A1 segment is aplastic. Both A2 segments fill from the left with a patent anterior communicating artery. M1 segments are normal. MCA bifurcations are within normal limits bilaterally. Segmental narrowing of MCA branch vessels is noted bilaterally without a significant proximal stenosis or occlusion. Posterior circulation: The left vertebral artery is the dominant vessel. The V3 and V4 segment are hypoplastic on the right. PICA origin is visualized. No flow is  present below the dural margin. The basilar artery is normal. Both posterior cerebral arteries originate from basilar tip. Venous sinuses: Dural sinuses are patent. The straight sinus and deep cerebral veins are patent. Left transverse sinus is dominant. Cortical veins are within normal limits. Anatomic variants: None Review of the MIP images confirms the above findings CT Brain Perfusion Findings: ASPECTS: Not scored due to chronic ischemia. CBF (<30%) Volume: 94mL Perfusion (Tmax>6.0s) volume: 30mL Mismatch Volume: 50mL Infarction Location:n/a IMPRESSION: 1. 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 2. Hypoplastic right V3 and V4 segments. 3. No significant stenosis of the dominant left vertebral artery in the neck or head 4. Normal CTA appearance of the carotid arteries in the neck bilaterally. 5. Moderate diffuse small vessel disease throughout the circle-of-Willis without a significant proximal stenosis, aneurysm, or branch vessel occlusion. 6. Multilevel spondylosis of the cervical spine. 7. Aortic Atherosclerosis  (ICD10-I70.0) and Emphysema (ICD10-J43.9). Electronically Signed   By: San Morelle M.D.   On: 08/23/2019 15:25   DG Chest Port 1 View  Result Date: 08/23/2019 CLINICAL DATA:  AMS EXAM: PORTABLE CHEST 1 VIEW COMPARISON:  07/02/2019 FINDINGS: The heart size and mediastinal contours are within normal limits. Both lungs are clear. The visualized skeletal structures are unremarkable. IMPRESSION: No acute abnormality of the lungs in AP portable projection. Electronically Signed   By: Eddie Candle M.D.   On: 08/23/2019 15:55   CT HEAD CODE STROKE WO CONTRAST  Result Date: 08/23/2019 CLINICAL DATA:  Code stroke.  Aphasia. EXAM: CT HEAD WITHOUT CONTRAST TECHNIQUE: Contiguous axial images were obtained from the base of the skull through the vertex without intravenous contrast. COMPARISON:  Head MRI 07/29/2019 FINDINGS: Brain: No acute cortically based infarct, intracranial hemorrhage, mass, midline shift, or extra-axial fluid collection is identified. Patchy to confluent hypodensities in the cerebral white matter bilaterally are nonspecific but compatible with extensive chronic small vessel ischemic disease. Chronic infarcts are again seen in the deep gray nuclei and deep cerebral white matter bilaterally as well as in the right cerebellum. There is moderate cerebral atrophy. A 1.5 cm partially calcified extra-axial mass over the right frontal convexity is unchanged without associated edema. Vascular: Calcified atherosclerosis at the skull base. No hyperdense vessel. Skull: No fracture or suspicious osseous lesion. Sinuses/Orbits: Minimal mucosal thickening in the included paranasal sinuses. Clear mastoid air cells. Unremarkable included orbits. Other: None. ASPECTS [facility] Stroke Program Early CT Score) Not scored due to extensive chronic ischemia. IMPRESSION: 1. No evidence of acute intracranial abnormality. 2. Extensive chronic small vessel ischemic disease. 3. Unchanged 1.5 cm right frontal meningioma.  These results were communicated to Dr. Lorrin Goodell at 2:51 pm on 08/23/2019 by text page via the [facility] messaging system. Electronically Signed   By: Logan Bores M.D.   On: 08/23/2019 14:51    Procedures Procedures (including critical care time) CRITICAL CARE Performed by: Valarie Merino   Total critical care time: 30 minutes  Critical care time was exclusive of separately billable procedures and treating other patients.  Critical care was necessary to treat or prevent imminent or life-threatening deterioration.  Critical care was time spent personally by me on the following activities: development of treatment plan with patient and/or surrogate as well as nursing, discussions with consultants, evaluation of patient's response to treatment, examination of patient, obtaining history from patient or surrogate, ordering and performing treatments and interventions, ordering and review of laboratory studies, ordering and review of radiographic studies, pulse oximetry and re-evaluation  of patient's condition.   Medications Ordered in ED Medications  sodium chloride flush (NS) 0.9 % injection 3 mL (has no administration in time range)  potassium chloride SA (KLOR-CON) CR tablet 40 mEq (has no administration in time range)  iohexol (OMNIPAQUE) 350 MG/ML injection 100 mL (100 mLs Intravenous Contrast Given 08/23/19 1506)    ED Course  I have reviewed the triage vital signs and the nursing notes.  Pertinent labs & imaging results that were available during my care of the patient were reviewed by me and considered in my medical decision making (see chart for details).    MDM Rules/Calculators/A&P                          MDM  Screen complete  Jevonte Clanton was evaluated in Emergency Department on 08/23/2019 for the symptoms described in the history of present illness. He was evaluated in the context of the global COVID-19 pandemic, which necessitated consideration that the patient might be  at risk for infection with the SARS-CoV-2 virus that causes COVID-19. Institutional protocols and algorithms that pertain to the evaluation of patients at risk for COVID-19 are in a state of rapid change based on information released by regulatory bodies including the CDC and federal and state organizations. These policies and algorithms were followed during the patient's care in the ED.  Patient is presenting for evaluation of altered mental status.  Patient did not meet criteria for acute neuro intervention -including administration of TPA.  Patient's ED work-up does not demonstrate clear cause of his mental status change.  Patient with benefit from additional work-up and treatment as an inpatient.  Medicine team is aware case and will evaluate for admission.  Final Clinical Impression(s) / ED Diagnoses Final diagnoses:  Altered mental status, unspecified altered mental status type    Rx / DC Orders ED Discharge Orders    None       Valarie Merino, MD 08/23/19 1625

## 2019-08-23 NOTE — ED Notes (Signed)
cbg 117

## 2019-08-24 ENCOUNTER — Observation Stay (HOSPITAL_COMMUNITY): Payer: Medicare Other

## 2019-08-24 DIAGNOSIS — G9340 Encephalopathy, unspecified: Secondary | ICD-10-CM

## 2019-08-24 DIAGNOSIS — Z888 Allergy status to other drugs, medicaments and biological substances status: Secondary | ICD-10-CM | POA: Diagnosis not present

## 2019-08-24 DIAGNOSIS — Z87891 Personal history of nicotine dependence: Secondary | ICD-10-CM | POA: Diagnosis not present

## 2019-08-24 DIAGNOSIS — M6281 Muscle weakness (generalized): Secondary | ICD-10-CM | POA: Diagnosis not present

## 2019-08-24 DIAGNOSIS — G319 Degenerative disease of nervous system, unspecified: Secondary | ICD-10-CM | POA: Diagnosis not present

## 2019-08-24 DIAGNOSIS — E876 Hypokalemia: Secondary | ICD-10-CM | POA: Diagnosis not present

## 2019-08-24 DIAGNOSIS — R569 Unspecified convulsions: Secondary | ICD-10-CM | POA: Diagnosis not present

## 2019-08-24 DIAGNOSIS — T407X1A Poisoning by cannabis (derivatives), accidental (unintentional), initial encounter: Secondary | ICD-10-CM | POA: Diagnosis present

## 2019-08-24 DIAGNOSIS — R1311 Dysphagia, oral phase: Secondary | ICD-10-CM | POA: Diagnosis not present

## 2019-08-24 DIAGNOSIS — Z886 Allergy status to analgesic agent status: Secondary | ICD-10-CM | POA: Diagnosis not present

## 2019-08-24 DIAGNOSIS — R29818 Other symptoms and signs involving the nervous system: Secondary | ICD-10-CM | POA: Diagnosis not present

## 2019-08-24 DIAGNOSIS — I69351 Hemiplegia and hemiparesis following cerebral infarction affecting right dominant side: Secondary | ICD-10-CM | POA: Diagnosis not present

## 2019-08-24 DIAGNOSIS — R4701 Aphasia: Secondary | ICD-10-CM | POA: Diagnosis present

## 2019-08-24 DIAGNOSIS — D320 Benign neoplasm of cerebral meninges: Secondary | ICD-10-CM | POA: Diagnosis present

## 2019-08-24 DIAGNOSIS — Z743 Need for continuous supervision: Secondary | ICD-10-CM | POA: Diagnosis not present

## 2019-08-24 DIAGNOSIS — Z79899 Other long term (current) drug therapy: Secondary | ICD-10-CM | POA: Diagnosis not present

## 2019-08-24 DIAGNOSIS — M2550 Pain in unspecified joint: Secondary | ICD-10-CM | POA: Diagnosis not present

## 2019-08-24 DIAGNOSIS — I739 Peripheral vascular disease, unspecified: Secondary | ICD-10-CM | POA: Diagnosis not present

## 2019-08-24 DIAGNOSIS — I69391 Dysphagia following cerebral infarction: Secondary | ICD-10-CM | POA: Diagnosis not present

## 2019-08-24 DIAGNOSIS — Z7902 Long term (current) use of antithrombotics/antiplatelets: Secondary | ICD-10-CM | POA: Diagnosis not present

## 2019-08-24 DIAGNOSIS — I952 Hypotension due to drugs: Secondary | ICD-10-CM | POA: Diagnosis not present

## 2019-08-24 DIAGNOSIS — G8929 Other chronic pain: Secondary | ICD-10-CM | POA: Diagnosis not present

## 2019-08-24 DIAGNOSIS — I10 Essential (primary) hypertension: Secondary | ICD-10-CM | POA: Diagnosis not present

## 2019-08-24 DIAGNOSIS — Y929 Unspecified place or not applicable: Secondary | ICD-10-CM | POA: Diagnosis not present

## 2019-08-24 DIAGNOSIS — G92 Toxic encephalopathy: Secondary | ICD-10-CM | POA: Diagnosis not present

## 2019-08-24 DIAGNOSIS — Z20822 Contact with and (suspected) exposure to covid-19: Secondary | ICD-10-CM | POA: Diagnosis present

## 2019-08-24 DIAGNOSIS — R7303 Prediabetes: Secondary | ICD-10-CM | POA: Diagnosis not present

## 2019-08-24 DIAGNOSIS — I6501 Occlusion and stenosis of right vertebral artery: Secondary | ICD-10-CM | POA: Diagnosis present

## 2019-08-24 DIAGNOSIS — Z981 Arthrodesis status: Secondary | ICD-10-CM | POA: Diagnosis not present

## 2019-08-24 DIAGNOSIS — K219 Gastro-esophageal reflux disease without esophagitis: Secondary | ICD-10-CM | POA: Diagnosis not present

## 2019-08-24 DIAGNOSIS — M545 Low back pain: Secondary | ICD-10-CM | POA: Diagnosis not present

## 2019-08-24 DIAGNOSIS — R5381 Other malaise: Secondary | ICD-10-CM | POA: Diagnosis not present

## 2019-08-24 DIAGNOSIS — I6789 Other cerebrovascular disease: Secondary | ICD-10-CM | POA: Diagnosis not present

## 2019-08-24 DIAGNOSIS — E785 Hyperlipidemia, unspecified: Secondary | ICD-10-CM | POA: Diagnosis not present

## 2019-08-24 DIAGNOSIS — Z7401 Bed confinement status: Secondary | ICD-10-CM | POA: Diagnosis not present

## 2019-08-24 DIAGNOSIS — Z751 Person awaiting admission to adequate facility elsewhere: Secondary | ICD-10-CM | POA: Diagnosis not present

## 2019-08-24 DIAGNOSIS — N179 Acute kidney failure, unspecified: Secondary | ICD-10-CM | POA: Diagnosis present

## 2019-08-24 LAB — CBC
HCT: 36.4 % — ABNORMAL LOW (ref 39.0–52.0)
Hemoglobin: 11.6 g/dL — ABNORMAL LOW (ref 13.0–17.0)
MCH: 27.4 pg (ref 26.0–34.0)
MCHC: 31.9 g/dL (ref 30.0–36.0)
MCV: 86.1 fL (ref 80.0–100.0)
Platelets: 216 10*3/uL (ref 150–400)
RBC: 4.23 MIL/uL (ref 4.22–5.81)
RDW: 17.3 % — ABNORMAL HIGH (ref 11.5–15.5)
WBC: 9.4 10*3/uL (ref 4.0–10.5)
nRBC: 0 % (ref 0.0–0.2)

## 2019-08-24 LAB — AMMONIA: Ammonia: 13 umol/L (ref 9–35)

## 2019-08-24 LAB — TSH: TSH: 1.995 u[IU]/mL (ref 0.350–4.500)

## 2019-08-24 LAB — BASIC METABOLIC PANEL
Anion gap: 7 (ref 5–15)
BUN: 16 mg/dL (ref 8–23)
CO2: 27 mmol/L (ref 22–32)
Calcium: 9 mg/dL (ref 8.9–10.3)
Chloride: 103 mmol/L (ref 98–111)
Creatinine, Ser: 1.11 mg/dL (ref 0.61–1.24)
GFR calc Af Amer: >60 mL/min (ref >60)
GFR calc non Af Amer: >60 mL/min (ref >60)
Glucose, Bld: 108 mg/dL — ABNORMAL HIGH (ref 70–99)
Potassium: 3.6 mmol/L (ref 3.5–5.1)
Sodium: 137 mmol/L (ref 135–145)

## 2019-08-24 LAB — LACTIC ACID, PLASMA: Lactic Acid, Venous: 1.4 mmol/L (ref 0.5–1.9)

## 2019-08-24 LAB — FOLATE: Folate: 10.7 ng/mL (ref >5.9)

## 2019-08-24 MED ORDER — GADOBUTROL 1 MMOL/ML IV SOLN
7.0000 mL | Freq: Once | INTRAVENOUS | Status: AC | PRN
  Administered 2019-08-24: 7 mL via INTRAVENOUS

## 2019-08-24 NOTE — Evaluation (Signed)
SLP Cancellation Note  Patient Details Name: Meshilem Machuca MRN: [id number] DOB: May 11, 1950   Cancelled treatment:       Reason Eval/Treat Not Completed: Other (comment) (pt passed [facility], please order if indicated)  Kathleen Lime, MS [facility] S[facility] [place] Office [phone]  Macario Golds 08/24/2019, 11:03 AM

## 2019-08-24 NOTE — Progress Notes (Addendum)
Subjective: Interviewed patient at bedside. He reports not remembering much from yesterday before being at the hospital. He reports that he was smoking marijuana yesterday around noon. When he was discharged from the hospital on 7/3 he was instructed to no longer take his blood pressure medicines because he was normotensive in the hospital without it. When asked if he was still taking his blood pressure medicines he stated he thought he was. We discussed that smoking marijuana was not advisable because of his previous brain injury. That it could have exaggerated effects on him. He agreed to stop smoking. I asked if I could talk with his wife and he agreed.  When I reached out to his wife she stated that she thought he was still taking his blood pressure medicines. When we discussed his smoking of marijuana she stated that he had been doing that and that he would not be doing so any more.   Objective:  Vital signs in last 24 hours: Vitals:   08/23/19 1959 08/23/19 2343 08/24/19 0346 08/24/19 0814  BP: (!) 145/78 (!) 131/75 (!) 113/54 111/69  Pulse: 68 58 54 58  Resp: 18 16 16 17   Temp: 97.7 F (36.5 C) 97.6 F (36.4 C) 97.7 F (36.5 C) 98.2 F (36.8 C)  TempSrc: Oral Oral Oral Oral  SpO2: 100% 100% 100% 100%  Weight:       Physical Exam Vitals and nursing note reviewed.  Constitutional:      General: He is not in acute distress.    Appearance: He is normal weight. He is not ill-appearing or toxic-appearing.  HENT:     Head: Normocephalic and atraumatic.  Eyes:     Extraocular Movements: Extraocular movements intact.  Cardiovascular:     Rate and Rhythm: Normal rate and regular rhythm.     Pulses: Normal pulses.          Radial pulses are 2+ on the right side and 2+ on the left side.       Dorsalis pedis pulses are 2+ on the right side and 2+ on the left side.     Heart sounds: Normal heart sounds, S1 normal and S2 normal.  Abdominal:     General: There is no distension.      Palpations: Abdomen is soft. There is no mass.     Tenderness: There is no abdominal tenderness.  Musculoskeletal:     Right lower leg: No edema.     Left lower leg: No edema.  Neurological:     Mental Status: He is alert. He is disoriented.     Comments: Has right sided weakness 3/5  Upper and lower extremities consistent with previous hospitalization. Strength in the left upper and lower extremities is 4/5.  On dorsiflexion myoclonus was present on the Right side, this was not present at last admission.  Psychiatric:        Mood and Affect: Mood normal.        Behavior: Behavior normal.        Thought Content: Thought content normal.      Assessment/Plan:  Principal Problem:   Acute encephalopathy Active Problems:   Hyperlipidemia   Essential hypertension   Peripheral vascular disease (HCC)   GERD (gastroesophageal reflux disease)   History of CVA (cerebrovascular accident)   Hypokalemia   Right arm and right-sided back pain   Balance problem   Neurocognitive deficits   [name] is a 69 year old male with a history of hypertension, hyperlipidemia, PVD,  and priorCVAs (right lacunar infarct in 2014 followed by left MCA territory infarct in December of 2020)with residual right sided weakness, ambulates with a cane who presented from home with his wife after an episode of encephalopathy.    Acute encephalopathy: Presented to ER yesterday. CT head showed- No evidence of acute intracranial abnormality. Extensive chronic small vessel ischemic disease. Unchanged 1.5 cm right frontal meningioma. EEG today showed- No seizures or epileptiform discharges, study within normal limits. Encephalopathy is improving, not yet at baseline. Likely etiology continued antihypertensive medication or marijuana use. -Have advised to stop taking all antihypertensive medications -Have advised to stop marijuana use   AKI: Baseline Creatinine is around 1.0. On presentation creatinine was 1.27  and BUN was 22. Today creatinine returned to baseline   HTN: Pressures have been stable during hospitalization. No BP medications have been given.    HLD - Continue Lipitor 80 mg   History of CVA - Continue Plavix 75 mg   Prior to Admission Living Arrangement: Home Anticipated Discharge Location: Home Barriers to Discharge: none Dispo: Anticipated discharge in approximately 1 day(s).   Stephen Cedar, MD 08/24/2019, 1:13 PM Pager: [phone] After 5pm on weekdays and 1pm on weekends: On Call pager [phone]

## 2019-08-24 NOTE — Procedures (Signed)
Patient Name: Stephen [name]  MRN: [id number]  Epilepsy Attending: Lora Havens  Referring Physician/Provider: Dr Nathanial Rancher Date: 08/24/2019  Duration: 26 mins  Patient history: 69 y.o. male with history of HTN, HLD, PVD and stroke presented with speech disturbance, ams and weakness, EEG to evaluate for seizure.  Level of alertness: Awake  AEDs during EEG study: None  Technical aspects: This EEG study was done with scalp electrodes positioned according to the 10-20 International system of electrode placement. Electrical activity was acquired at a sampling rate of 500Hz  and reviewed with a high frequency filter of 70Hz  and a low frequency filter of 1Hz . EEG data were recorded continuously and digitally stored.   Description: The posterior dominant rhythm consists of 9 Hz activity of moderate voltage (25-35 uV) seen predominantly in posterior head regions, symmetric and reactive to eye opening and eye closing. Physiology photic driving was seen during photic stimulation.  Hyperventilation was not performed.     IMPRESSION: This study is within normal limits. No seizures or epileptiform discharges were seen throughout the recording.  Stephen [name]

## 2019-08-24 NOTE — Progress Notes (Signed)
EEG is normal.   Neurology will sign off. Please call if there are any additional questions.   Electronically signed: Dr. Kerney Elbe

## 2019-08-24 NOTE — Progress Notes (Signed)
STAT EEG complete - results pending. ? ?

## 2019-08-25 LAB — GLUCOSE, CAPILLARY: Glucose-Capillary: 113 mg/dL — ABNORMAL HIGH (ref 70–99)

## 2019-08-25 NOTE — Discharge Summary (Addendum)
Name: Stephen [name] MRN: [id number] DOB: 11-18-1950 69 y.o. PCP: Mitzi Hansen, MD  Date of Admission: 08/23/2019  2:33 PM Date of Discharge: 08/27/2019 Attending Physician: Velna Ochs, MD  Discharge Diagnosis: 1.   Acute encephalopathy (resolved) secondary to marijuana and possibly due to continued antihypertensive medications 2.  AKI (resolved)  Discharge Medications: Allergies as of 08/27/2019      Reactions   Aspirin Other (See Comments)   Stomach pain/ dizziness   Lisinopril Other (See Comments)   Acute kidney insufficiency in 2013 (Cr elevated from 1.00 to 1.79      Medication List    TAKE these medications   amLODipine 10 MG tablet Commonly known as: NORVASC Take 10 mg by mouth daily.   Aspercreme Lidocaine 4 % Ptch Generic drug: Lidocaine Apply 1 patch topically daily as needed (Pain).   aspirin EC 81 MG tablet Take 81 mg by mouth daily. Swallow whole.   atorvastatin 80 MG tablet Commonly known as: LIPITOR TAKE ONE (1) TABLET BY MOUTH EVERY DAY AT SIX IN THE EVENING What changed:   how much to take  how to take this  when to take this  additional instructions   bisacodyl 10 MG suppository Commonly known as: DULCOLAX Place 10 mg rectally daily as needed for moderate constipation.   clopidogrel 75 MG tablet Commonly known as: PLAVIX TAKE 1 TABLET BY MOUTH EVERY DAY What changed:   how much to take  how to take this  when to take this  additional instructions   gabapentin 400 MG capsule Commonly known as: NEURONTIN Take 1 capsule (400 mg total) by mouth 3 (three) times daily.   magnesium hydroxide 400 MG/5ML suspension Commonly known as: MILK OF MAGNESIA Take 30 mLs by mouth daily as needed for mild constipation.   pantoprazole 20 MG tablet Commonly known as: PROTONIX TAKE ONE (1) TABLET BY MOUTH EVERY DAY What changed:   how much to take  how to take this  when to take this  additional instructions   SODIUM  PHOSPHATES RE Place 1 each rectally daily as needed (Constipation).   triamterene-hydrochlorothiazide 37.5-25 MG capsule Commonly known as: DYAZIDE Take 1 capsule by mouth daily.       Disposition and follow-up:   Stephen [name] was discharged from [facility] in Stable condition.  At the hospital follow up visit please address:  1.  Acute encephalopathy (resolved) secondary to marijuana and possibly due to  continued antihypertensive medications: Smoked marijuana at noon, began to have symptoms a few hours later. CT showed no acute changes since last scan. Recommended ceasing smoking marijuana and reiterated stopping all antihypertensives. Ensure cessation of smoking marijuana is discussed. Reassess blood pressure may need some medication.   AKI (resolved): At admission your Creatinine and BUN were elevated after a 1 L bolus of LR this resolved and kidney function returned to baseline.   2.  Labs / imaging needed at time of follow-up: BMP  3.  Pending labs/ test needing follow-up: none  Follow-up Appointments: Once you are discharged from the SNF please make a follow appointment at the IMTS clinic to see your Primary Care Provider within a week.   Hospital Course by problem list: 1. Acute encephalopathy secondary to marijuana and continued antihypertensive medications: Presented to the ER because patient wasn't feeling well and broke out in sweats, and was more altered than usual according to wife. UDS showed marijuana. Recently admitted on 6/30 for TIA vs recrudescence of prior  stroke. Patient had been at [facility]  Until 7/16. On presentation to the ED patient was slow to respond to questioning but A&Ox3. CT head negative for acute changes. On talking with wife she states he had been smoking marijuana at noon and then a few hours later began to have changes. She also endorsed his continued taking of antihypertensives. By the next day he had begun to return  baseline. He admitted smoking marijuana before his symptoms started and that he thinks he was still taking his antihypertensives. By the second day of admission he had returned to baseline. PT and OT recommended SNF and both patient and wife agreed. Have recommended that he no longer smoke marijuana. Also reiterated that he should not use any of his previously prescribed antihypertensives.   AKI (resolved): At presentation had Creatinine at 1.27 and BUN 26. Was given a 1 L bolus of LR. This resolved the AKI and he returned to baseline.   Discharge Vitals:   BP (!) 145/74 (BP Location: Right Arm)   Pulse 58   Temp 98.1 F (36.7 C) (Oral)   Resp 16   Wt 70 kg   SpO2 99%   BMI 21.52 kg/m   Pertinent Labs, Studies, and Procedures:  CBC Latest Ref Rng & Units 08/24/2019 08/23/2019 08/23/2019  WBC 4.0 - 10.5 K/uL 9.4 - 10.0  Hemoglobin 13.0 - 17.0 g/dL 11.6(L) 15.3 13.8  Hematocrit 39 - 52 % 36.4(L) 45.0 42.5  Platelets 150 - 400 K/uL 216 - 245   BMP Latest Ref Rng & Units 08/24/2019 08/23/2019 08/23/2019  Glucose 70 - 99 mg/dL 108(H) 111(H) 112(H)  BUN 8 - 23 mg/dL 16 26(H) 22  Creatinine 0.61 - 1.24 mg/dL 1.11 1.20 1.27(H)  BUN/Creat Ratio 10 - 24 - - -  Sodium 135 - 145 mmol/L 137 141 139  Potassium 3.5 - 5.1 mmol/L 3.6 3.4(L) 3.7  Chloride 98 - 111 mmol/L 103 101 101  CO2 22 - 32 mmol/L 27 - 25  Calcium 8.9 - 10.3 mg/dL 9.0 - 9.4    CT code stroke CTA Head W/WO Contrast: Occlusion of the right vertebral artery at its origin, reconstituted at the skull base. Hypoplastic right V3 and V4 segments. No significant stenosis of the dominant left vertebral artery in the neck or head. Normal CTA appearance of the carotid arteries in the neck bilaterally. Moderate diffuse small vessel disease throughout the circle[place] without a significant proximal stenosis, aneurysm, or branch vessel occlusion. Multilevel spondylosis of the cervical spine. Aortic Atherosclerosis and Emphysema   MR Brain W  WO Contrast: No acute intracranial abnormality. Advanced atrophy and chronic ischemic microangiopathy. Old right cerebellar infarct.  EEG: Study within normal limits. No seizures or epileptiform discharges seen throughout the recording.   Discharge Instructions: Discharge Instructions    Call MD for:   Complete by: As directed    Call MD for:  difficulty breathing, headache or visual disturbances   Complete by: As directed    Call MD for:  extreme fatigue   Complete by: As directed    Call MD for:  hives   Complete by: As directed    Call MD for:  persistant dizziness or light-headedness   Complete by: As directed    Call MD for:  persistant nausea and vomiting   Complete by: As directed    Call MD for:  redness, tenderness, or signs of infection (pain, swelling, redness, odor or green/yellow discharge around incision site)   Complete by: As directed  Call MD for:  severe uncontrolled pain   Complete by: As directed    Call MD for:  temperature >100.4   Complete by: As directed    Diet - low sodium heart healthy   Complete by: As directed    Discharge instructions   Complete by: As directed    Dear Stephen [name], It was a pleasure taking care of you. You were admitted for Altered Mental Status. Please ensure you no longer use any marijuana. Please ensure you do not take any of your antihypertensive medications. Please make a follow up appointment at the IMTS clinic once you are discharged from the SNF.  Thank you very much   Increase activity slowly   Complete by: As directed    Increase activity slowly   Complete by: As directed       Signed: Briant Cedar, MD 08/27/2019, 2:19 PM   Pager: [phone]

## 2019-08-25 NOTE — NC FL2 (Signed)
[place] LEVEL OF CARE SCREENING TOOL     IDENTIFICATION  Patient Name: Shomari Scicchitano Birthdate: 12-04-1950 Sex: male Admission Date (Current Location): 08/23/2019  [facility] and [place] Number:  Herbalist and Address:  [facility] [place] [place] [address] [place], [place] [address]      Provider Number: [id number]  Attending Physician Name and Address:  Velna Ochs, MD  Relative Name and Phone Number:       Current Level of Care: Hospital Recommended Level of Care: [place] Prior Approval Number:    Date Approved/Denied:   PASRR Number: [id number]  Discharge Plan: SNF    Current Diagnoses: Patient Active Problem List   Diagnosis Date Noted  . Acute encephalopathy 08/23/2019  . Neurocognitive deficits 08/04/2019  . Weakness 07/30/2019  . Acute right-sided weakness 07/30/2019  . Hypotension due to drugs   . Balance problem 07/28/2019  . Lung nodule 02/26/2019  . Acute left-sided muscle weakness 02/18/2019  . CVA (cerebral vascular accident) ([place]) 01/01/2019  . Right arm and right-sided back pain 12/09/2017  . Tubular adenoma of colon 12/09/2017  . Hemorrhoids 07/06/2015  . Prediabetes 03/12/2013  . Healthcare maintenance 12/19/2012  . Hypokalemia 09/19/2012  . History of CVA (cerebrovascular accident) 09/08/2012  . GERD (gastroesophageal reflux disease) 01/28/2012  . Chronic back pain s/p L5-S1 laminectomy 05/09/2011  . Chest pain 06/02/2007  . Peripheral vascular disease ([place]) 12/10/2006  . Hyperlipidemia 06/12/2006  . Essential hypertension 06/12/2006    Orientation RESPIRATION BLADDER Height & Weight     Self, Place  Normal Incontinent Weight: 70 kg Height:     BEHAVIORAL SYMPTOMS/MOOD NEUROLOGICAL BOWEL NUTRITION STATUS      Continent Diet (regular with thin liquids)  AMBULATORY STATUS COMMUNICATION OF NEEDS Skin   Limited Assist Verbally Normal                       Personal  Care Assistance Level of Assistance  Bathing, Feeding, Dressing Bathing Assistance: Limited assistance Feeding assistance: Independent Dressing Assistance: Limited assistance     Functional Limitations Info  Sight, Hearing, Speech Sight Info: Adequate Hearing Info: Adequate Speech Info: Adequate    SPECIAL CARE FACTORS FREQUENCY  PT (By licensed PT), OT (By licensed OT)     PT Frequency: 5x/wk OT Frequency: 5x/wk            Contractures Contractures Info: Not present    Additional Factors Info  Code Status, Allergies Code Status Info: DNR Allergies Info: Aspirin/ lisinopril           Current Medications (08/25/2019):  This is the current hospital active medication list Current Facility-Administered Medications  Medication Dose Route Frequency Provider Last Rate Last Admin  . atorvastatin (LIPITOR) tablet 80 mg  80 mg Oral Daily Jean Rosenthal, MD   80 mg at 08/25/19 0921  . clopidogrel (PLAVIX) tablet 75 mg  75 mg Oral Daily Jean Rosenthal, MD   75 mg at 08/25/19 2505  . enoxaparin (LOVENOX) injection 40 mg  40 mg Subcutaneous Q24H Agyei, Obed K, MD   40 mg at 08/24/19 1847  . pantoprazole (PROTONIX) EC tablet 20 mg  20 mg Oral Daily Jean Rosenthal, MD   20 mg at 08/25/19 0921  . polyethylene glycol (MIRALAX / GLYCOLAX) packet 17 g  17 g Oral Daily PRN Jean Rosenthal, MD         Discharge Medications: Please see discharge summary  for a list of discharge medications.  Relevant Imaging Results:  Relevant Lab Results:   Additional Information SS#: [id number]  Pollie Friar, RN

## 2019-08-25 NOTE — Evaluation (Signed)
Physical Therapy Evaluation Patient Details Name: Stephen [name] MRN: [id number] DOB: 01-25-1951 Today's Date: 08/25/2019   History of Present Illness  Pt is a 69 y.o. male with PMH of HTN, PVD, CVA, GERD, HLD, L5-S1 laminectomy, hypokalemia, prediabetes, and intermittent claudication. Presents to clinic with 1-2 week worsening R-sided weakness. Admitted 07/29/19.  MRI negative for acute infarct, however moderate atrophy and chronic microvascular ischemic changes noted. Right frontal meningioma unchanged.   Clinical Impression  Pt was evaluated for the above diagnosis and the impairments noted below. Pt required min guard for safety with bed mobility. Pt required min guard assistance and a RW with transfers and ambulation. Pt was educated and cued on proper gait mechanics with RW. Pt states that he lives alone but has a wife that is able to assist him whenever needed; unsure of accuracy of this statement as pt was confused in session. Recommend that patient utilize SNF level therapy to help return him to PLOF. If pt has 24 hour support, feel he could manage with HHPT. Pt would continue to benefit from acute therapy services to return him to a more indepedent level of functioning. Will continue to follow him acutely    Follow Up Recommendations SNF    Equipment Recommendations  None recommended by PT    Recommendations for Other Services       Precautions / Restrictions Precautions Precautions: Fall Restrictions Weight Bearing Restrictions: No      Mobility  Bed Mobility Overal bed mobility: Needs Assistance Bed Mobility: Supine to Sit     Supine to sit: Min guard     General bed mobility comments: min guard assist required for safety, increased time with supine to sit   Transfers Overall transfer level: Needs assistance Equipment used: Rolling walker (2 wheeled) Transfers: Sit to/from Stand Sit to Stand: Min guard         General transfer comment: verbal cues for safe  hand placement and transition from bed to RW, needed min guard for steady upon standing  Ambulation/Gait Ambulation/Gait assistance: Min guard;+2 safety/equipment (chair follow) Gait Distance (Feet): 75 Feet Assistive device: Rolling walker (2 wheeled) Gait Pattern/deviations: Decreased stride length;Trunk flexed;Wide base of support;Decreased step length - left;Decreased step length - right;Decreased dorsiflexion - left;Decreased dorsiflexion - right;Staggering left;Staggering right Gait velocity: decreased Gait velocity interpretation: <1.31 ft/sec, indicative of household ambulator General Gait Details: Pt required min guard assist with RW. Very slow gait with decreased heel strike and DF with initial contact. Pt was cued for longer strides, pull the toes, and walk upright. Pt required external cue to look at for upright posture  Stairs            Wheelchair Mobility    Modified Rankin (Stroke Patients Only)       Balance Overall balance assessment: Needs assistance Sitting-balance support: No upper extremity supported;Feet supported Sitting balance-Leahy Scale: Fair Sitting balance - Comments: pt able to maintain static balance with feet supported, pt had LOB with MMT at EOB   Standing balance support: During functional activity;Bilateral upper extremity supported;No upper extremity supported Standing balance-Leahy Scale: Poor Standing balance comment: pt was able to maintain static standing balance without UE support for 30 seconds              Pertinent Vitals/Pain Pain Assessment: No/denies pain    Home Living Family/patient expects to be discharged to:: Private residence Living Arrangements: Alone Available Help at Discharge: Family;Available 24 hours/day Type of Home: House Home Access: Stairs to enter Entrance  Stairs-Rails: Right;Left Entrance Stairs-Number of Steps: 3 Home Layout: One level Home Equipment: Cane - single point;Walker -  standard Additional Comments: pt has been using SPC for years    Prior Function Level of Independence: Independent with assistive device(s)         Comments: independent ADLs using RW in home and cane in commuinty; spouse assist with iADLs - per patient report that he was independent.      Hand Dominance   Dominant Hand: Right    Extremity/Trunk Assessment   Upper Extremity Assessment Upper Extremity Assessment: Overall WFL for tasks assessed RUE Deficits / Details: gross -4/5 MMT RUE Sensation: history of peripheral neuropathy RUE Coordination: decreased fine motor LUE Deficits / Details: grossly -4/5, shoulder flexion weaker than RUE LUE Sensation: history of peripheral neuropathy LUE Coordination: decreased fine motor    Lower Extremity Assessment Lower Extremity Assessment: Generalized weakness RLE Deficits / Details: gross -4/5, tone noted in PF, resting tremor noted in ankle RLE Sensation: WNL RLE Coordination: decreased fine motor;decreased gross motor LLE Deficits / Details: gross +3/5 LLE Coordination: decreased fine motor;decreased gross motor    Cervical / Trunk Assessment Cervical / Trunk Assessment: Kyphotic  Communication   Communication: Expressive difficulties  Cognition Arousal/Alertness: Awake/alert Behavior During Therapy: Flat affect Overall Cognitive Status: Impaired/Different from baseline Area of Impairment: Following commands;Safety/judgement;Problem solving;Memory;Orientation          Orientation Level: Disoriented to;Situation;Time   Memory: Decreased short-term memory Following Commands: Follows one step commands consistently;Follows one step commands with increased time;Follows multi-step commands inconsistently Safety/Judgement: Decreased awareness of safety;Decreased awareness of deficits Awareness: Emergent Problem Solving: Slow processing;Decreased initiation;Difficulty sequencing;Requires verbal cues;Requires tactile  cues General Comments: pt knew his name and where he was located. he couldn't remember why he was here nor what the full date was, he kept repeated its the 27th for the year      General Comments General comments (skin integrity, edema, etc.): pt proprioceptive awareness intact for BLE, pt presented with inconsistent sensation presentation L vs R    Exercises     Assessment/Plan    PT Assessment Patient needs continued PT services  PT Problem List Decreased strength;Decreased range of motion;Decreased activity tolerance;Decreased balance;Decreased mobility;Decreased coordination;Decreased cognition;Decreased safety awareness       PT Treatment Interventions DME instruction;Gait training;Stair training;Functional mobility training;Therapeutic activities;Therapeutic exercise;Balance training;Cognitive remediation;Patient/family education    PT Goals (Current goals can be found in the Care Plan section)  Acute Rehab PT Goals Patient Stated Goal: to get better PT Goal Formulation: With patient Time For Goal Achievement: 09/08/19 Potential to Achieve Goals: Fair    Frequency Min 2X/week   Barriers to discharge Decreased caregiver support      Co-evaluation               AM-PAC PT "6 Clicks" Mobility  Outcome Measure Help needed turning from your back to your side while in a flat bed without using bedrails?: None Help needed moving from lying on your back to sitting on the side of a flat bed without using bedrails?: None Help needed moving to and from a bed to a chair (including a wheelchair)?: A Little Help needed standing up from a chair using your arms (e.g., wheelchair or bedside chair)?: A Little Help needed to walk in hospital room?: A Little Help needed climbing 3-5 steps with a railing? : A Lot 6 Click Score: 19    End of Session Equipment Utilized During Treatment: Gait belt Activity Tolerance: Patient tolerated treatment  well Patient left: with call Neoma Uhrich/phone  within reach;in chair;with chair alarm set Nurse Communication: Mobility status PT Visit Diagnosis: Unsteadiness on feet (R26.81);Other abnormalities of gait and mobility (R26.89);Repeated falls (R29.6);Muscle weakness (generalized) (M62.81);History of falling (Z91.81);Difficulty in walking, not elsewhere classified (R26.2)    Time: 5910-2890 PT Time Calculation (min) (ACUTE ONLY): 47 min   Charges:   PT Evaluation $PT Eval Moderate Complexity: 1 Mod PT Treatments $Gait Training: 8-22 mins $Therapeutic Activity: 8-22 mins        Gloriann Loan, SPT  Acute Rehabilitation Services  Office: [phone]  08/25/2019, 1:11 PM

## 2019-08-25 NOTE — TOC Initial Note (Signed)
Transition of Care [facility]) - Initial/Assessment Note    Patient Details  Name: Stephen [name] MRN: [id number] Date of Birth: August 26, 1950  Transition of [facility]) CM/SW Contact:    Pollie Friar, RN Phone Number: 08/25/2019, 2:50 PM  Clinical Narrative:                 Recommendations are for SNF rehab. CM met with the patient and he is agreeable to returning to [facility]. CM asked about faxing him out to other SNF and he was agreeable. He was also in agreement with me talking to his spouse. CM has left her a voice message. FL2 done and faxed out.  TOC following.  Expected Discharge Plan: Skilled Nursing Facility Barriers to Discharge: Continued Medical Work up   Patient Goals and CMS Choice   CMS Medicare.gov Compare Post Acute Care list provided to:: Patient Choice offered to / list presented to : Patient  Expected Discharge Plan and Services Expected Discharge Plan: [place] In-house Referral: Clinical Social Work Discharge Planning Services: CM Consult Post Acute Care Choice: [place] arrangements for the past 2 months: [place]                                      Prior Living Arrangements/Services Living arrangements for the past 2 months: Single Family Home Lives with:: Self Patient language and need for interpreter reviewed:: Yes Do you feel safe going back to the place where you live?: Yes      Need for Family Participation in Patient Care: Yes (Comment) Care giver support system in place?: No (comment)   Criminal Activity/Legal Involvement Pertinent to Current Situation/Hospitalization: No - Comment as needed  Activities of Daily Living      Permission Sought/Granted                  Emotional Assessment Appearance:: Appears stated age Attitude/Demeanor/Rapport: Engaged Affect (typically observed): Accepting Orientation: : Oriented to Self, Oriented to Place   Psych Involvement: No  (comment)  Admission diagnosis:  Acute encephalopathy [G93.40] Altered mental status, unspecified altered mental status type [R41.82] Patient Active Problem List   Diagnosis Date Noted  . Acute encephalopathy 08/23/2019  . Neurocognitive deficits 08/04/2019  . Weakness 07/30/2019  . Acute right-sided weakness 07/30/2019  . Hypotension due to drugs   . Balance problem 07/28/2019  . Lung nodule 02/26/2019  . Acute left-sided muscle weakness 02/18/2019  . CVA (cerebral vascular accident) ([place]) 01/01/2019  . Right arm and right-sided back pain 12/09/2017  . Tubular adenoma of colon 12/09/2017  . Hemorrhoids 07/06/2015  . Prediabetes 03/12/2013  . Healthcare maintenance 12/19/2012  . Hypokalemia 09/19/2012  . History of CVA (cerebrovascular accident) 09/08/2012  . GERD (gastroesophageal reflux disease) 01/28/2012  . Chronic back pain s/p L5-S1 laminectomy 05/09/2011  . Chest pain 06/02/2007  . Peripheral vascular disease ([place]) 12/10/2006  . Hyperlipidemia 06/12/2006  . Essential hypertension 06/12/2006   PCP:  Mitzi Hansen, MD Pharmacy:   [id number] [place] [place] [id number]: [phone]: [phone] [id number] [address] [place]: [phone]: [phone]   Social Determinants of Health (SDOH) Interventions    Readmission Risk Interventions No  flowsheet data found.

## 2019-08-25 NOTE — Progress Notes (Addendum)
Subjective: Interviewed patient at bedside. Patient reports that he is feeling okay today.  We discussed PT recommendations about SNF. Had spoken with his wife who is agreeable to this. He is okay with this.   He has no further questions.  Objective:  Vital signs in last 24 hours: Vitals:   08/24/19 1644 08/24/19 1900 08/25/19 0028 08/25/19 0300  BP: 123/72 (!) 129/64 125/65 101/80  Pulse: 57 58 50 57  Resp: 18 17 17 19   Temp: 98.5 F (36.9 C) 97.9 F (36.6 C) 98.2 F (36.8 C) 97.7 F (36.5 C)  TempSrc: Oral Oral Oral Oral  SpO2: 100% 100% 100% 100%  Weight:       Physical Exam Vitals and nursing note reviewed.  Constitutional:      General: He is not in acute distress.    Appearance: Normal appearance. He is not ill-appearing or toxic-appearing.  HENT:     Head: Normocephalic and atraumatic.  Eyes:     Extraocular Movements: Extraocular movements intact.  Cardiovascular:     Rate and Rhythm: Normal rate and regular rhythm.     Pulses: Normal pulses.          Radial pulses are 2+ on the right side and 2+ on the left side.       Dorsalis pedis pulses are 2+ on the right side and 2+ on the left side.     Heart sounds: Normal heart sounds, S1 normal and S2 normal. No murmur heard.   Pulmonary:     Effort: Pulmonary effort is normal. No respiratory distress.     Breath sounds: Normal breath sounds. No wheezing.  Abdominal:     General: There is no distension.     Palpations: Abdomen is soft. There is no mass.     Tenderness: There is no abdominal tenderness.  Musculoskeletal:     Right lower leg: No edema.     Left lower leg: No edema.  Neurological:     General: No focal deficit present.     Mental Status: He is alert. Mental status is at baseline.     Comments: His strength has now normalized between both his sides. Upper and lower extremities bilateral are 4/5. Myoclonus present in Right foot with plantar and dorsi flexion.  Psychiatric:        Mood and Affect:  Mood normal.        Behavior: Behavior normal.        Thought Content: Thought content normal.      Assessment/Plan:  Principal Problem:   Acute encephalopathy Active Problems:   Hyperlipidemia   Essential hypertension   Peripheral vascular disease (HCC)   GERD (gastroesophageal reflux disease)   History of CVA (cerebrovascular accident)   Hypokalemia   Right arm and right-sided back pain   Balance problem   Neurocognitive deficits   [name] is a 69 year old male with a history of hypertension, hyperlipidemia, PVD, and prior CVAs (right lacunar infarct in 2014 followed by left MCA territory infarct in December of 2020) with residual right sided weakness, ambulates with a cane who presented from home with his wife after an episode of encephalopathy.      Acute encephalopathy: Likely 2/2 to marijuana use, possible drug induced hypotension, mental status now back to baseline. PT recommends SNF, he and his wife are agreeable. Transitions of Care has been contacted to arrange SNF placement. -Have reiterated to stop taking all antihypertensive medications -Have reiterated to stop marijuana use  AKI: Resolved.     HTN: Pressures have been stable during hospitalization. No BP medications have been given.  -Re-enforced that he should not take any of his blood pressure medications     HLD - Continue Lipitor 80 mg     History of CVA - Continue Plavix 75 mg     Prior to Admission Living Arrangement: Home Anticipated Discharge Location: SNF Barriers to Discharge: SNF placement Dispo: Anticipated discharge pending SNF placement.   Briant Cedar, MD 08/25/2019, 6:09 AM Pager: [phone] After 5pm on weekdays and 1pm on weekends: On Call pager [phone]

## 2019-08-26 NOTE — Evaluation (Signed)
Occupational Therapy Evaluation Patient Details Name: Stephen [name] MRN: [id number] DOB: 07/28/1950 Today's Date: 08/26/2019    History of Present Illness Pt is a 69 y.o. male with PMH of HTN, PVD, CVA, GERD, HLD, L5-S1 laminectomy, hypokalemia, prediabetes, and intermittent claudication. Presents to ED from home with episode of witnessed encephalopathy.  Electrolytes unremarkable. No clear sign of infection, no leukocytosis, and afebrile. CXR shows no acute change. CT head negative for acute infarct. AMS work up still pending.   Clinical Impression   PTA pt reports being independent with ADLs and using SPC/RW for mobility. Pt was admitted for above and treated for problem list below (see OT Problem List). Pt is A&Ox2 disoriented to time and situation. Requires multimodal  cues for sequencing and problem solving with increased time for following 1 step commands - unable to complete multi-step commands. Requires Min guard - Total A with verbal cues for safety and sequencing with ADLs due to decreased cognition, limited ROM, decreased coordination, and decreased activity tolerance. Requires Min guard - min A with transfers and ambulation with use of RW needing multimodal cues for safety and hand placement with RW. Noted pt limping on R leg and when asked about pain pt reports, "I ache all over." Believe pt would benefit from skilled OT services acutely and at the SNF level to increase safety and participation in ADLs.    Follow Up Recommendations  SNF;Supervision/Assistance - 24 hour    Equipment Recommendations  3 in 1 bedside commode       Precautions / Restrictions Precautions Precautions: Fall Restrictions Weight Bearing Restrictions: No      Mobility Bed Mobility Overal bed mobility: Needs Assistance Bed Mobility: Supine to Sit     Supine to sit: Min guard     General bed mobility comments: min guard assist required for safety, increased time with supine to sit    Transfers Overall transfer level: Needs assistance Equipment used: Rolling walker (2 wheeled) Transfers: Sit to/from Stand Sit to Stand: Min guard;Min assist         General transfer comment: Min guard for sit<>stand from bed and min A sit<>stand from chair without arms. Verbal cues for hand placement with RW    Balance Overall balance assessment: Needs assistance Sitting-balance support: No upper extremity supported;Feet supported Sitting balance-Leahy Scale: Fair Sitting balance - Comments: min guard with attempting LB dressing   Standing balance support: During functional activity;Bilateral upper extremity supported;No upper extremity supported Standing balance-Leahy Scale: Poor Standing balance comment: pt was able to wash face with both hands unsupported with no noted LOB but needing BUE assist with mobility with RW                           ADL either performed or assessed with clinical judgement   ADL Overall ADL's : Needs assistance/impaired     Grooming: Min guard;Wash/dry face;Standing   Upper Body Bathing: Cueing for safety;Cueing for sequencing;Sitting;Minimal assistance   Lower Body Bathing: Sitting/lateral leans;Sit to/from stand;Cueing for safety;Cueing for sequencing;Maximal assistance   Upper Body Dressing : Minimal assistance;Cueing for safety;Cueing for sequencing;Sitting   Lower Body Dressing: Total assistance;Sitting/lateral leans;Sit to/from stand;Cueing for safety;Cueing for sequencing   Toilet Transfer: Min guard;Ambulation;Cueing for safety;Cueing for sequencing;RW Toilet Transfer Details (indicate cue type and reason): simulated to recliner Toileting- Clothing Manipulation and Hygiene: Total assistance;Cueing for safety;Cueing for sequencing;Sitting/lateral lean;Sit to/from stand     Tub/Shower Transfer Details (indicate cue type and reason): deferred  due to pt safety Functional mobility during ADLs: Min guard;Rolling walker;Cueing  for safety;Cueing for sequencing General ADL Comments: patient limited by imparied balance, decreased activity tolerance, impaired cognition, and decrease coordination     Vision Baseline Vision/History: Wears glasses Wears Glasses: At all times Patient Visual Report: No change from baseline Vision Assessment?: No apparent visual deficits            Pertinent Vitals/Pain Pain Assessment: No/denies pain     Hand Dominance Right   Extremity/Trunk Assessment Upper Extremity Assessment Upper Extremity Assessment: Overall WFL for tasks assessed;Generalized weakness RUE Deficits / Details: gross -4/5 MMT limited ROM in shoulder flexion RUE Sensation: history of peripheral neuropathy RUE Coordination: decreased fine motor LUE Deficits / Details: grossly -4/5, shoulder flexion weaker than RUE LUE Sensation: history of peripheral neuropathy LUE Coordination: decreased fine motor   Lower Extremity Assessment Lower Extremity Assessment: Defer to PT evaluation   Cervical / Trunk Assessment Cervical / Trunk Assessment: Kyphotic   Communication Communication Communication: Expressive difficulties (slurred speech and word finding)   Cognition Arousal/Alertness: Awake/alert Behavior During Therapy: Flat affect Overall Cognitive Status: Impaired/Different from baseline Area of Impairment: Following commands;Safety/judgement;Awareness;Problem solving;Orientation                 Orientation Level: Disoriented to;Situation;Time   Memory: Decreased short-term memory Following Commands: Follows one step commands consistently;Follows one step commands with increased time;Follows multi-step commands inconsistently Safety/Judgement: Decreased awareness of safety;Decreased awareness of deficits Awareness: Intellectual Problem Solving: Slow processing;Decreased initiation;Difficulty sequencing;Requires verbal cues;Requires tactile cues General Comments: Patient disoriented to time and  situation - needing increased time and multimodal cues for one step commands. inconsistently following multistep commands   General Comments  Pt reporting he aches all over            Home Living Family/patient expects to be discharged to:: Private residence Living Arrangements: Alone Available Help at Discharge: Family;Available 24 hours/day Type of Home: House Home Access: Stairs to enter [employer] of Steps: 3 Entrance Stairs-Rails: Right;Left Home Layout: One level     Bathroom Shower/Tub: Teacher, early years/pre: Handicapped height Bathroom Accessibility: Yes   Home Equipment: Cane - single point;Walker - standard   Additional Comments: pt has been using SPC for years      Prior Functioning/Environment Level of Independence: Independent with assistive device(s)        Comments: independent with ADLs using RW in home and SPC in the community        OT Problem List: Decreased strength;Decreased activity tolerance;Impaired balance (sitting and/or standing);Decreased cognition;Decreased safety awareness;Decreased knowledge of use of DME or AE;Decreased knowledge of precautions;Decreased coordination;Impaired UE functional use;Decreased range of motion;Pain      OT Treatment/Interventions: Self-care/ADL training;Therapeutic exercise;DME and/or AE instruction;Therapeutic activities;Patient/family education;Balance training;Cognitive remediation/compensation;Energy conservation    OT Goals(Current goals can be found in the care plan section) Acute Rehab OT Goals Patient Stated Goal: to get better OT Goal Formulation: With patient Time For Goal Achievement: 09/09/19 Potential to Achieve Goals: Good  OT Frequency: Min 2X/week    AM-PAC OT "6 Clicks" Daily Activity     Outcome Measure Help from another person eating meals?: None Help from another person taking care of personal grooming?: A Little Help from another person toileting, which  includes using toliet, bedpan, or urinal?: Total Help from another person bathing (including washing, rinsing, drying)?: A Lot Help from another person to put on and taking off regular upper body clothing?: A Little Help from another person  to put on and taking off regular lower body clothing?: Total 6 Click Score: 14   End of Session Equipment Utilized During Treatment: Gait belt Nurse Communication: Mobility status  Activity Tolerance: Patient tolerated treatment well Patient left: in chair;with call bell/phone within reach;with chair alarm set  OT Visit Diagnosis: Other abnormalities of gait and mobility (R26.89);Muscle weakness (generalized) (M62.81);Other symptoms and signs involving cognitive function;Pain Pain - part of body:  (all over - noted limp with R leg)                Time: 6438-3818 OT Time Calculation (min): 22 min Charges:  OT General Charges $OT Visit: 1 Visit OT Evaluation $OT Eval Moderate Complexity: 1 Mod  Sanai Frick/OTS  Teja Costen 08/26/2019, 9:18 AM

## 2019-08-26 NOTE — Progress Notes (Addendum)
Subjective: Interviewed patient at bedside. He reports doing well. No issues over night.   Objective:  Vital signs in last 24 hours: Vitals:   08/25/19 1606 08/25/19 1959 08/25/19 2300 08/26/19 0300  BP: (!) 125/52 (!) 124/61 (!) 131/57 (!) 112/57  Pulse: 57 65 56 98  Resp: 18 19 18 19   Temp: 98.3 F (36.8 C) 98.1 F (36.7 C) 98 F (36.7 C) 98 F (36.7 C)  TempSrc: Oral Oral Oral Oral  SpO2: 100% 100% 100% 100%  Weight:       Physical Exam Vitals and nursing note reviewed.  Constitutional:      General: He is not in acute distress.    Appearance: Normal appearance. He is not ill-appearing or toxic-appearing.  Cardiovascular:     Rate and Rhythm: Normal rate and regular rhythm.     Pulses:          Radial pulses are 2+ on the right side and 2+ on the left side.       Dorsalis pedis pulses are 2+ on the right side and 2+ on the left side.     Heart sounds: Normal heart sounds, S1 normal and S2 normal. No murmur heard.   Pulmonary:     Effort: Pulmonary effort is normal. No respiratory distress.     Breath sounds: Normal breath sounds. No wheezing.  Abdominal:     General: There is no distension.     Palpations: There is no mass.     Tenderness: There is no abdominal tenderness.  Musculoskeletal:     Right lower leg: No edema.     Left lower leg: No edema.  Neurological:     General: No focal deficit present.     Mental Status: He is alert. Mental status is at baseline.     Comments: Myoclonus present with plantar flexion  Psychiatric:        Mood and Affect: Mood normal.        Behavior: Behavior normal.        Thought Content: Thought content normal.      Assessment/Plan:  Principal Problem:   Acute encephalopathy Active Problems:   Hyperlipidemia   Essential hypertension   Peripheral vascular disease (HCC)   GERD (gastroesophageal reflux disease)   History of CVA (cerebrovascular accident)   Hypokalemia   Right arm and right-sided back pain    Balance problem   Neurocognitive deficits   [name] is a2 year old male with a history of hypertension, hyperlipidemia, PVD, and priorCVAs (right lacunar infarct in 2014 followed by left MCA territory infarct in December of 2020)with residual right sided weakness, ambulates with a canewho presented from home with his wife after an episode of encephalopathy.His encephalopathy most like due to marijuana use and continued antihypertensive use, this has resolved. He is awaiting SNF placement.   Acute encephalopathy (resolved):Likely 2/2 to marijuana use, possible drug induced hypotension, mental status now back to baseline. Yesterday PT recommends SNF, he and his wife are agreeable. Transitions of Care has been contacted to arrange SNF placement. OT saw patient today and also recommends SNF as well. -Re-enforced he should stop taking all antihypertensive medications -Re-enforced he should stop marijuana use   AKI: Resolved.   HTN:Has remained normotensive with occasional soft blood pressures.  -Discussed again that he should not take any of his blood pressure medications   HLD - Continue Lipitor 80 mg   History of CVA - Continue Plavix 75 mg   Prior to Admission  Living Arrangement:Home Anticipated Discharge Location:SNF Barriers to Discharge:SNF placement Dispo: Anticipated discharge pending SNF placement.   Briant Cedar, MD 08/26/2019, 5:56 AM Pager: [phone] After 5pm on weekdays and 1pm on weekends: On Call pager [phone]

## 2019-08-27 DIAGNOSIS — R2689 Other abnormalities of gait and mobility: Secondary | ICD-10-CM | POA: Diagnosis not present

## 2019-08-27 DIAGNOSIS — G629 Polyneuropathy, unspecified: Secondary | ICD-10-CM | POA: Diagnosis not present

## 2019-08-27 DIAGNOSIS — N179 Acute kidney failure, unspecified: Secondary | ICD-10-CM | POA: Diagnosis not present

## 2019-08-27 DIAGNOSIS — I69391 Dysphagia following cerebral infarction: Secondary | ICD-10-CM | POA: Diagnosis not present

## 2019-08-27 DIAGNOSIS — R7303 Prediabetes: Secondary | ICD-10-CM | POA: Diagnosis not present

## 2019-08-27 DIAGNOSIS — M545 Low back pain: Secondary | ICD-10-CM | POA: Diagnosis not present

## 2019-08-27 DIAGNOSIS — M2550 Pain in unspecified joint: Secondary | ICD-10-CM | POA: Diagnosis not present

## 2019-08-27 DIAGNOSIS — I739 Peripheral vascular disease, unspecified: Secondary | ICD-10-CM | POA: Diagnosis not present

## 2019-08-27 DIAGNOSIS — E876 Hypokalemia: Secondary | ICD-10-CM | POA: Diagnosis not present

## 2019-08-27 DIAGNOSIS — M6281 Muscle weakness (generalized): Secondary | ICD-10-CM | POA: Diagnosis not present

## 2019-08-27 DIAGNOSIS — Z741 Need for assistance with personal care: Secondary | ICD-10-CM | POA: Diagnosis not present

## 2019-08-27 DIAGNOSIS — R5381 Other malaise: Secondary | ICD-10-CM | POA: Diagnosis not present

## 2019-08-27 DIAGNOSIS — K219 Gastro-esophageal reflux disease without esophagitis: Secondary | ICD-10-CM | POA: Diagnosis not present

## 2019-08-27 DIAGNOSIS — I69351 Hemiplegia and hemiparesis following cerebral infarction affecting right dominant side: Secondary | ICD-10-CM | POA: Diagnosis not present

## 2019-08-27 DIAGNOSIS — Z743 Need for continuous supervision: Secondary | ICD-10-CM | POA: Diagnosis not present

## 2019-08-27 DIAGNOSIS — Z8673 Personal history of transient ischemic attack (TIA), and cerebral infarction without residual deficits: Secondary | ICD-10-CM | POA: Diagnosis not present

## 2019-08-27 DIAGNOSIS — G8929 Other chronic pain: Secondary | ICD-10-CM | POA: Diagnosis not present

## 2019-08-27 DIAGNOSIS — G9340 Encephalopathy, unspecified: Secondary | ICD-10-CM | POA: Diagnosis not present

## 2019-08-27 DIAGNOSIS — I10 Essential (primary) hypertension: Secondary | ICD-10-CM | POA: Diagnosis not present

## 2019-08-27 DIAGNOSIS — R29818 Other symptoms and signs involving the nervous system: Secondary | ICD-10-CM | POA: Diagnosis not present

## 2019-08-27 DIAGNOSIS — I639 Cerebral infarction, unspecified: Secondary | ICD-10-CM | POA: Diagnosis not present

## 2019-08-27 DIAGNOSIS — Z7401 Bed confinement status: Secondary | ICD-10-CM | POA: Diagnosis not present

## 2019-08-27 DIAGNOSIS — G92 Toxic encephalopathy: Secondary | ICD-10-CM | POA: Diagnosis not present

## 2019-08-27 DIAGNOSIS — R1311 Dysphagia, oral phase: Secondary | ICD-10-CM | POA: Diagnosis not present

## 2019-08-27 DIAGNOSIS — I952 Hypotension due to drugs: Secondary | ICD-10-CM | POA: Diagnosis not present

## 2019-08-27 DIAGNOSIS — E785 Hyperlipidemia, unspecified: Secondary | ICD-10-CM | POA: Diagnosis not present

## 2019-08-27 DIAGNOSIS — Z981 Arthrodesis status: Secondary | ICD-10-CM | POA: Diagnosis not present

## 2019-08-27 NOTE — Progress Notes (Signed)
Report given to FEE, Therapist, sports at [employer].

## 2019-08-27 NOTE — TOC Transition Note (Signed)
Transition of Care [facility]) - CM/SW Discharge Note   Patient Details  Name: Stephen [name] MRN: [id number] Date of Birth: 12/06/1950  [facility]) CM/SW Contact:  Geralynn Ochs, LCSW Phone Number: 08/27/2019, 3:38 PM   Clinical Narrative:   Nurse to call report to [phone]    Final next level of care: Skilled Nursing Facility Barriers to Discharge: Barriers Resolved   Patient Goals and CMS Choice   CMS Medicare.gov Compare Post Acute Care list provided to:: Patient Choice offered to / list presented to : Patient  Discharge Placement              Patient chooses bed at: [facility] Patient to be transferred to facility by: [facility] Name of family member notified: [name] Patient and family notified of of transfer: 08/27/19  Discharge Plan and Services In-house Referral: Clinical Social Work Discharge Planning Services: [employer] Consult Post Acute Care Choice: [facility]                               Social Determinants of Health (SDOH) Interventions     Readmission Risk Interventions No flowsheet data found.

## 2019-08-27 NOTE — Progress Notes (Signed)
Attempt report x1 to [employer]

## 2019-08-27 NOTE — Plan of Care (Signed)
Adequate for discharge.

## 2019-08-27 NOTE — Progress Notes (Signed)
Subjective:   Patient is feeling well this morning. He denies shortness of breath or pain.   Objective:  Vital signs in last 24 hours: Vitals:   08/26/19 1538 08/26/19 1948 08/26/19 2326 08/27/19 0337  BP: 123/72 (!) 132/64 (!) 127/63 (!) 138/71  Pulse: 55 59 55 55  Resp: 16 18 18 18   Temp: (!) 97.5 F (36.4 C) 97.9 F (36.6 C) 97.9 F (36.6 C) 97.9 F (36.6 C)  TempSrc: Oral Oral Oral Oral  SpO2: 100% 99% 99% 99%  Weight:       Physical Exam Vitals and nursing note reviewed.  Constitutional:      General: He is not in acute distress.    Appearance: Normal appearance. He is not ill-appearing or toxic-appearing.  HENT:     Head: Normocephalic and atraumatic.  Cardiovascular:     Rate and Rhythm: Normal rate and regular rhythm.     Pulses: Normal pulses.          Radial pulses are 2+ on the right side and 2+ on the left side.       Dorsalis pedis pulses are 2+ on the right side and 2+ on the left side.     Heart sounds: Normal heart sounds, S1 normal and S2 normal. No murmur heard.   Pulmonary:     Effort: Pulmonary effort is normal. No respiratory distress.     Breath sounds: Normal breath sounds. No wheezing.  Abdominal:     General: There is no distension.     Palpations: There is no mass.     Tenderness: There is no abdominal tenderness.  Musculoskeletal:     Right lower leg: No edema.     Left lower leg: No edema.  Neurological:     General: No focal deficit present.     Mental Status: He is alert. Mental status is at baseline.  Psychiatric:        Mood and Affect: Mood normal.        Behavior: Behavior normal.        Thought Content: Thought content normal.        Judgment: Judgment normal.      Assessment/Plan:  Principal Problem:   Acute encephalopathy Active Problems:   Hyperlipidemia   Essential hypertension   Peripheral vascular disease (HCC)   GERD (gastroesophageal reflux disease)   History of CVA (cerebrovascular accident)    Hypokalemia   Right arm and right-sided back pain   Balance problem   Neurocognitive deficits   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 He is awaiting SNF placement.   Acute encephalopathy (resolved):Likely 2/2 to marijuana use, possible drug induced hypotension, mental status now back to baseline. Awaiting SNF placement   PJK:[id number].   HTN:Has remained normotensive with occasional soft blood pressures.    HLD - Continue Lipitor 80 mg   History of CVA - Continue Plavix 75 mg   Prior to Admission Living Arrangement: home Anticipated Discharge Location: home Barriers to Discharge: SNF placement Dispo: Anticipated discharge pending SNF placement.   Briant Cedar, MD 08/27/2019, 6:25 AM Pager: [phone] After 5pm on weekdays and 1pm on weekends: On Call pager  [phone]  

## 2019-08-27 NOTE — Progress Notes (Signed)
Patient discharged with his wallet, cell phone and charger

## 2019-08-28 ENCOUNTER — Non-Acute Institutional Stay (SKILLED_NURSING_FACILITY): Payer: Medicare Other | Admitting: Adult Health

## 2019-08-28 ENCOUNTER — Encounter: Payer: Self-pay | Admitting: Adult Health

## 2019-08-28 DIAGNOSIS — N179 Acute kidney failure, unspecified: Secondary | ICD-10-CM | POA: Diagnosis not present

## 2019-08-28 DIAGNOSIS — Z8673 Personal history of transient ischemic attack (TIA), and cerebral infarction without residual deficits: Secondary | ICD-10-CM | POA: Diagnosis not present

## 2019-08-28 DIAGNOSIS — I10 Essential (primary) hypertension: Secondary | ICD-10-CM

## 2019-08-28 DIAGNOSIS — K219 Gastro-esophageal reflux disease without esophagitis: Secondary | ICD-10-CM

## 2019-08-28 DIAGNOSIS — G629 Polyneuropathy, unspecified: Secondary | ICD-10-CM | POA: Diagnosis not present

## 2019-08-28 DIAGNOSIS — G9340 Encephalopathy, unspecified: Secondary | ICD-10-CM | POA: Diagnosis not present

## 2019-08-28 LAB — CULTURE, BLOOD (ROUTINE X 2)
Culture: NO GROWTH
Culture: NO GROWTH
Special Requests: ADEQUATE

## 2019-08-28 NOTE — Progress Notes (Signed)
Location:  [facility] Room Number: 283-T Place of Service:  SNF (31) Provider:  Durenda Age, DNP, FNP-BC  Patient Care Team: Mitzi Hansen, MD as PCP - General  Extended Emergency Contact Information Primary Emergency Contact: Goldblatt,Bonnie Address: [address]          [place], [place] [address] [place] Phone: [phone] Relation: Spouse  Code Status:  DNR  Goals of care: Advanced Directive information Advanced Directives 08/14/2019  Does Patient Have a Medical Advance Directive? Yes  Type of Advance Directive Out of facility DNR (pink MOST or yellow form)  Does patient want to make changes to medical advance directive? No - Patient declined  Would patient like information on creating a medical advance directive? -  Pre-existing out of facility DNR order (yellow form or pink MOST form) -     Chief Complaint  Patient presents with  . Acute Visit    Patient is seen for hospital followup, status post hospitalization at [facility] 7/25-7/29/21 for acute encephalopathy    HPI:  Pt is a 70 y.o. male admitted to [facility] on 08/27/19 post [facility]  hospitalization 08/23/19 to 08/27/19 for acute encephalopathy secondary to marijuana and possibly due to continued antihypertensive medications.  He apparently smoked marijuana on the noom of day of admission to the hospital, began to have altered mental status.  CT showed no acute changes since last scan.  UDS showed marijuana.  Creatinine was 1.57 and BUN 26 at presentation.  He was given 1 L bolus of LR.  On second day of admission to the hospital, he had returned to baseline.  It was recommended for him to have PT and OT at SNF. 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   Past Medical History:  Diagnosis Date  . Alcohol abuse   . Arthropathy     shoulder  . Chest pain   . Chronic cough   . Foot pain    bilateral  . Hyperlipidemia   . Hypertension   . Intermittent claudication (HCC)    bilateral  . Latent tuberculosis   . Lumbago   . Shoulder pain    Past Surgical History:  Procedure Laterality Date  . APPENDECTOMY    . HAMMER TOE SURGERY     right 2nd. and 5th  . LAMINECTOMY  1989-1991   left lumbar     Allergies  Allergen Reactions  . Aspirin Other (See Comments)    Stomach pain/ dizziness  . Lisinopril Other (See Comments)    Acute kidney insufficiency in 2013 (Cr elevated from 1.00 to 1.79    Outpatient Encounter Medications as of 08/28/2019  Medication Sig  . amLODipine (NORVASC) 10 MG tablet Take 10 mg by mouth daily.  Marland Kitchen aspirin EC 81 MG tablet Take 81 mg by mouth daily. Swallow whole.  Marland Kitchen atorvastatin (LIPITOR) 80 MG tablet TAKE ONE (1) TABLET BY MOUTH EVERY DAY AT SIX IN THE EVENING  . bisacodyl (DULCOLAX) 10 MG suppository Place 10 mg rectally daily as needed for moderate constipation.  . clopidogrel (PLAVIX) 75 MG tablet TAKE 1 TABLET BY MOUTH EVERY DAY  . gabapentin (NEURONTIN) 400 MG capsule Take 1 capsule (400 mg total) by mouth 3 (three) times daily.  . Lidocaine (ASPERCREME LIDOCAINE) 4 % PTCH Apply 1 patch topically daily as needed (Pain).  . magnesium hydroxide (MILK OF MAGNESIA) 400  MG/5ML suspension Take 30 mLs by mouth daily as needed for mild constipation.  . pantoprazole (PROTONIX) 20 MG tablet TAKE ONE (1) TABLET BY MOUTH EVERY DAY  . SODIUM PHOSPHATES RE Place 1 each rectally daily as needed (Constipation).  . triamterene-hydrochlorothiazide (DYAZIDE) 37.5-25 MG capsule Take 1 capsule by mouth daily.   No facility-administered encounter medications on file as of 08/28/2019.    Review of Systems  GENERAL: No change in appetite, no fatigue, no weight changes, no fever, chills or weakness MOUTH and THROAT: Denies oral discomfort, gingival pain or bleeding RESPIRATORY: no cough, SOB, DOE,  wheezing, hemoptysis CARDIAC: No chest pain, edema or palpitations GI: No abdominal pain, diarrhea, constipation, heart burn, nausea or vomiting GU: Denies dysuria, frequency, hematuria, incontinence, or discharge NEUROLOGICAL: Denies dizziness, syncope, numbness, or headache PSYCHIATRIC: Denies feelings of depression or anxiety. No report of hallucinations, insomnia, paranoia, or agitation    Immunization History  Administered Date(s) Administered  . Fluad Quad(high Dose 65+) 01/05/2019  . Influenza,inj,Quad PF,6+ Mos 11/07/2016, 10/01/2018  . Moderna SARS-COVID-2 Vaccination 02/05/2019, 04/13/2019  . Pneumococcal Conjugate-13 04/04/2016  . Pneumococcal Polysaccharide-23 01/03/2019  . Tdap 03/12/2013   Pertinent  Health Maintenance Due  Topic Date Due  . INFLUENZA VACCINE  08/30/2019  . COLONOSCOPY  08/29/2026  . PNA vac Low Risk Adult  Completed   Fall Risk  07/27/2019 07/14/2019 07/07/2019 03/17/2019 02/24/2019  Falls in the past year? 1 1 1 1 1   Number falls in past yr: 1 1 1  0 0  Comment 6-7 - - - -  Injury with Fall? 0 0 0 0 0  Risk Factor Category  - - - - -  Risk for fall due to : History of fall(s);Impaired balance/gait;Impaired mobility History of fall(s);Impaired balance/gait;Impaired mobility - History of fall(s);Impaired balance/gait;Impaired mobility History of fall(s);Impaired balance/gait;Impaired mobility  Risk for fall due to: Comment - - - - -  Follow up Falls prevention discussed Falls prevention discussed - Falls evaluation completed;Falls prevention discussed Falls prevention discussed     Vitals:   08/28/19 0856  BP: (!) 137/83  Pulse: 58  Resp: 22  Temp: (!) 97.3 F (36.3 C)  TempSrc: Oral  Weight: 154 lb 5.1 oz (70 kg)  Height: 5\' 11"  (1.803 m)   Body mass index is 21.52 kg/m.  Physical Exam  GENERAL APPEARANCE: Well nourished. In no acute distress. Normal body habitus SKIN:  Skin is warm and dry.  MOUTH and THROAT: Lips are without lesions.  Oral mucosa is moist and without lesions. Tongue is normal in shape, size, and color and without lesions RESPIRATORY: Breathing is even & unlabored, BS CTAB CARDIAC: RRR, no murmur,no extra heart sounds, no edema GI: Abdomen soft, normal BS, no masses, no tenderness EXTREMITIES: Able to move x4 extremities NEUROLOGICAL: There is no tremor. Speech is clear.  Alert to self, disoriented to time and place. Right-sided weakness. PSYCHIATRIC: Affect and behavior are appropriate  Labs reviewed: Recent Labs    07/07/19 0952 07/14/19 1110 07/31/19 0723 07/31/19 0723 08/23/19 1438 08/23/19 1443 08/24/19 0043  NA 137   < > 135   < > 139 141 137  K 3.1*   < > 3.9   < > 3.7 3.4* 3.6  CL 99   < > 102   < > 101 101 103  CO2 28   < > 23  --  25  --  27  GLUCOSE 95   < > 130*   < > 112* 111* 108*  BUN 16   < > 16   < > 22 26* 16  CREATININE 1.02   < > 1.26*   < > 1.27* 1.20 1.11  CALCIUM 9.3   < > 9.1  --  9.4  --  9.0  MG 2.1  --   --   --   --   --   --   PHOS 2.9  --   --   --   --   --   --    < > = values in this interval not displayed.   Recent Labs    01/01/19 1416 01/02/19 0444 08/23/19 1438  AST 22 23 26   ALT 17 14 22   ALKPHOS 83 74 79  BILITOT 0.4 0.7 0.9  PROT 7.9 6.8 8.0  ALBUMIN 3.9 3.4* 4.1   Recent Labs    07/02/19 1642 07/02/19 1642 07/30/19 0329 07/30/19 0329 08/23/19 1438 08/23/19 1443 08/24/19 0043  WBC 8.6   < > 8.6  --  10.0  --  9.4  NEUTROABS 4.0  --   --   --  4.2  --   --   HGB 13.5   < > 13.1   < > 13.8 15.3 11.6*  HCT 40.8   < > 38.7*   < > 42.5 45.0 36.4*  MCV 85.4   < > 83.8  --  87.1  --  86.1  PLT 248   < > 209  --  245  --  216   < > = values in this interval not displayed.   Lab Results  Component Value Date   TSH 1.995 08/24/2019   Lab Results  Component Value Date   HGBA1C 6.4 (H) 07/30/2019   Lab Results  Component Value Date   CHOL 124 07/30/2019   HDL 36 (L) 07/30/2019   LDLCALC 74 07/30/2019   TRIG 70 07/30/2019   CHOLHDL  3.4 07/30/2019    Significant Diagnostic Results in last 30 days:  EEG  Result Date: 08/24/2019 Lora Havens, MD     08/24/2019  9:03 AM Patient Name: Harveer Sadler MRN: [id number] Epilepsy Attending: Lora Havens Referring Physician/Provider: Dr Nathanial Rancher Date: 08/24/2019 Duration: 26 mins Patient history: 69 y.o. male with history of HTN, HLD, PVD and stroke presented with speech disturbance, ams and weakness, EEG to evaluate for seizure. Level of alertness: Awake AEDs during EEG study: None Technical aspects: This EEG study was done with scalp electrodes positioned according to the 10-20 International system of electrode placement. Electrical activity was acquired at a sampling rate of 500Hz  and reviewed with a high frequency filter of 70Hz  and a low frequency filter of 1Hz . EEG data were recorded continuously and digitally stored. Description: The posterior dominant rhythm consists of 9 Hz activity of moderate voltage (25-35 uV) seen predominantly in posterior head regions, symmetric and reactive to eye opening and eye closing. Physiology photic driving was seen during photic stimulation.  Hyperventilation was not performed.   IMPRESSION: This study is within normal limits. No seizures or epileptiform discharges were seen throughout the recording. Lora Havens   CT Code Stroke CTA Head W/WO contrast  Result Date: 08/23/2019 CLINICAL DATA:  Code stroke.  Aphasia. EXAM: CT ANGIOGRAPHY HEAD AND NECK CT PERFUSION BRAIN TECHNIQUE: Multidetector CT imaging of the head and neck was performed using the standard protocol during bolus administration of intravenous contrast. Multiplanar CT image reconstructions and MIPs were obtained to evaluate the vascular anatomy. Carotid stenosis  measurements (when applicable) are obtained utilizing NASCET criteria, using the distal internal carotid diameter as the denominator. Multiphase CT imaging of the brain was performed following IV bolus contrast  injection. Subsequent parametric perfusion maps were calculated using RAPID software. CONTRAST:  118mL OMNIPAQUE IOHEXOL 350 MG/ML SOLN COMPARISON:  None. FINDINGS: CTA NECK FINDINGS Aortic arch: A 3 vessel arch configuration is present. Atherosclerotic changes are present at the arch without significant stenosis or aneurysm. Right carotid system: The right common carotid artery is within normal limits. Bifurcation demonstrates minimal atherosclerotic change without significant stenosis. The cervical right ICA is normal. Left carotid system: The left common carotid artery is within normal limits. Bifurcation is unremarkable. Cervical left ICA is normal. Vertebral arteries: The left vertebral artery is the dominant vessel. Mild narrowing is present at the origin of the left vertebral artery from the subclavian without a significant stenosis relative to the more distal vessel. The right vertebral artery is occluded, reconstituted at the skull base. Skeleton: Multilevel degenerative changes are present cervical spine. Uncovertebral and facet hypertrophy contribute to foraminal narrowing at C3-4 bilaterally. Other neck: The soft tissues the neck are unremarkable. No significant adenopathy is present. Thyroid is normal. Salivary glands and ducts are within normal limits. Upper chest: Centrilobular emphysematous changes are present. No focal nodule, mass, or airspace disease is present. Thoracic inlet is within normal limits. Review of the MIP images confirms the above findings CTA HEAD FINDINGS Anterior circulation: Atherosclerotic calcifications are present within the cavernous internal carotid arteries bilaterally. No significant stenosis is present through the ICA termini. The right A1 segment is aplastic. Both A2 segments fill from the left with a patent anterior communicating artery. M1 segments are normal. MCA bifurcations are within normal limits bilaterally. Segmental narrowing of MCA branch vessels is noted  bilaterally without a significant proximal stenosis or occlusion. Posterior circulation: The left vertebral artery is the dominant vessel. The V3 and V4 segment are hypoplastic on the right. PICA origin is visualized. No flow is present below the dural margin. The basilar artery is normal. Both posterior cerebral arteries originate from basilar tip. Venous sinuses: Dural sinuses are patent. The straight sinus and deep cerebral veins are patent. Left transverse sinus is dominant. Cortical veins are within normal limits. Anatomic variants: None Review of the MIP images confirms the above findings CT Brain Perfusion Findings: ASPECTS: Not scored due to chronic ischemia. CBF (<30%) Volume: 66mL Perfusion (Tmax>6.0s) volume: 27mL Mismatch Volume: 1mL Infarction Location:n/a IMPRESSION: 1. 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 2. Hypoplastic right V3 and V4 segments. 3. No significant stenosis of the dominant left vertebral artery in the neck or head 4. Normal CTA appearance of the carotid arteries in the neck bilaterally. 5. Moderate diffuse small vessel disease throughout the circle-of-Willis without a significant proximal stenosis, aneurysm, or branch vessel occlusion. 6. Multilevel spondylosis of the cervical spine. 7. Aortic Atherosclerosis (ICD10-I70.0) and Emphysema (ICD10-J43.9). Electronically Signed   By: San Morelle M.D.   On: 08/23/2019 15:25   CT Code Stroke CTA Neck W/WO contrast  Result Date: 08/23/2019 CLINICAL DATA:  Code stroke.  Aphasia. EXAM: CT ANGIOGRAPHY HEAD AND NECK CT PERFUSION BRAIN TECHNIQUE: Multidetector CT imaging of the head and neck was performed using the standard protocol during bolus administration of intravenous contrast. Multiplanar CT image reconstructions and MIPs were obtained to evaluate the vascular anatomy. Carotid stenosis measurements (when applicable) are obtained utilizing NASCET criteria, using the distal internal  carotid diameter as  the denominator. Multiphase CT imaging of the brain was performed following IV bolus contrast injection. Subsequent parametric perfusion maps were calculated using RAPID software. CONTRAST:  116mL OMNIPAQUE IOHEXOL 350 MG/ML SOLN COMPARISON:  None. FINDINGS: CTA NECK FINDINGS Aortic arch: A 3 vessel arch configuration is present. Atherosclerotic changes are present at the arch without significant stenosis or aneurysm. Right carotid system: The right common carotid artery is within normal limits. Bifurcation demonstrates minimal atherosclerotic change without significant stenosis. The cervical right ICA is normal. Left carotid system: The left common carotid artery is within normal limits. Bifurcation is unremarkable. Cervical left ICA is normal. Vertebral arteries: The left vertebral artery is the dominant vessel. Mild narrowing is present at the origin of the left vertebral artery from the subclavian without a significant stenosis relative to the more distal vessel. The right vertebral artery is occluded, reconstituted at the skull base. Skeleton: Multilevel degenerative changes are present cervical spine. Uncovertebral and facet hypertrophy contribute to foraminal narrowing at C3-4 bilaterally. Other neck: The soft tissues the neck are unremarkable. No significant adenopathy is present. Thyroid is normal. Salivary glands and ducts are within normal limits. Upper chest: Centrilobular emphysematous changes are present. No focal nodule, mass, or airspace disease is present. Thoracic inlet is within normal limits. Review of the MIP images confirms the above findings CTA HEAD FINDINGS Anterior circulation: Atherosclerotic calcifications are present within the cavernous internal carotid arteries bilaterally. No significant stenosis is present through the ICA termini. The right A1 segment is aplastic. Both A2 segments fill from the left with a patent anterior communicating artery. M1 segments are  normal. MCA bifurcations are within normal limits bilaterally. Segmental narrowing of MCA branch vessels is noted bilaterally without a significant proximal stenosis or occlusion. Posterior circulation: The left vertebral artery is the dominant vessel. The V3 and V4 segment are hypoplastic on the right. PICA origin is visualized. No flow is present below the dural margin. The basilar artery is normal. Both posterior cerebral arteries originate from basilar tip. Venous sinuses: Dural sinuses are patent. The straight sinus and deep cerebral veins are patent. Left transverse sinus is dominant. Cortical veins are within normal limits. Anatomic variants: None Review of the MIP images confirms the above findings CT Brain Perfusion Findings: ASPECTS: Not scored due to chronic ischemia. CBF (<30%) Volume: 35mL Perfusion (Tmax>6.0s) volume: 8mL Mismatch Volume: 14mL Infarction Location:n/a IMPRESSION: 1. 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 2. Hypoplastic right V3 and V4 segments. 3. No significant stenosis of the dominant left vertebral artery in the neck or head 4. Normal CTA appearance of the carotid arteries in the neck bilaterally. 5. Moderate diffuse small vessel disease throughout the circle-of-Willis without a significant proximal stenosis, aneurysm, or branch vessel occlusion. 6. Multilevel spondylosis of the cervical spine. 7. Aortic Atherosclerosis (ICD10-I70.0) and Emphysema (ICD10-J43.9). Electronically Signed   By: San Morelle M.D.   On: 08/23/2019 15:25   MR BRAIN WO CONTRAST  Result Date: 07/29/2019 CLINICAL DATA:  Subacute neuro deficit.  Right-sided weakness. EXAM: MRI HEAD WITHOUT CONTRAST TECHNIQUE: Multiplanar, multiecho pulse sequences of the brain and surrounding structures were obtained without intravenous contrast. COMPARISON:  MRI head 01/02/2019, CT 01/01/2019 FINDINGS: Brain: Negative for acute infarct. Moderate atrophy. Ventricular  enlargement consistent with atrophy is unchanged. Moderate to advanced chronic microvascular ischemic change in the white matter. Chronic ischemia in the basal ganglia bilaterally and in the right cerebellum. Right frontal calcified extra-axial mass compatible with meningioma measuring 10 x  15 mm. No change from prior studies. No intracranial hemorrhage identified. Vascular: Normal arterial flow voids. Skull and upper cervical spine: Negative Sinuses/Orbits: Paranasal sinuses clear.  Negative orbit. Other: None IMPRESSION: Negative for acute infarct. Atrophy and chronic microvascular ischemic changes which are moderate to advanced Right frontal meningioma unchanged. Electronically Signed   By: Franchot Gallo M.D.   On: 07/29/2019 18:41   MR BRAIN W WO CONTRAST  Result Date: 08/24/2019 CLINICAL DATA:  Encephalopathy EXAM: MRI HEAD WITHOUT AND WITH CONTRAST TECHNIQUE: Multiplanar, multiecho pulse sequences of the brain and surrounding structures were obtained without and with intravenous contrast. CONTRAST:  83mL GADAVIST GADOBUTROL 1 MMOL/ML IV SOLN COMPARISON:  07/29/2018 FINDINGS: BRAIN: No acute infarct, acute hemorrhage or extra-axial collection. Early confluent hyperintense T2-weighted signal of the periventricular and deep white matter, most commonly due to chronic ischemic microangiopathy. Advanced atrophy for age. There is an old right cerebellar infarct. No chronic microhemorrhage. Normal midline structures. VASCULAR: Major flow voids are preserved. SKULL AND UPPER CERVICAL SPINE: Normal calvarium and skull base. Visualized upper cervical spine and soft tissues are normal. SINUSES/ORBITS: No paranasal sinus fluid levels or advanced mucosal thickening. No mastoid or middle ear effusion. Normal orbits. IMPRESSION: 1. No acute intracranial abnormality. 2. Advanced atrophy and chronic ischemic microangiopathy. 3. Old right cerebellar infarct. Electronically Signed   By: Ulyses Jarred M.D.   On: 08/24/2019  00:43   CT Code Stroke Cerebral Perfusion with contrast  Result Date: 08/23/2019 CLINICAL DATA:  Code stroke.  Aphasia. EXAM: CT ANGIOGRAPHY HEAD AND NECK CT PERFUSION BRAIN TECHNIQUE: Multidetector CT imaging of the head and neck was performed using the standard protocol during bolus administration of intravenous contrast. Multiplanar CT image reconstructions and MIPs were obtained to evaluate the vascular anatomy. Carotid stenosis measurements (when applicable) are obtained utilizing NASCET criteria, using the distal internal carotid diameter as the denominator. Multiphase CT imaging of the brain was performed following IV bolus contrast injection. Subsequent parametric perfusion maps were calculated using RAPID software. CONTRAST:  154mL OMNIPAQUE IOHEXOL 350 MG/ML SOLN COMPARISON:  None. FINDINGS: CTA NECK FINDINGS Aortic arch: A 3 vessel arch configuration is present. Atherosclerotic changes are present at the arch without significant stenosis or aneurysm. Right carotid system: The right common carotid artery is within normal limits. Bifurcation demonstrates minimal atherosclerotic change without significant stenosis. The cervical right ICA is normal. Left carotid system: The left common carotid artery is within normal limits. Bifurcation is unremarkable. Cervical left ICA is normal. Vertebral arteries: The left vertebral artery is the dominant vessel. Mild narrowing is present at the origin of the left vertebral artery from the subclavian without a significant stenosis relative to the more distal vessel. The right vertebral artery is occluded, reconstituted at the skull base. Skeleton: Multilevel degenerative changes are present cervical spine. Uncovertebral and facet hypertrophy contribute to foraminal narrowing at C3-4 bilaterally. Other neck: The soft tissues the neck are unremarkable. No significant adenopathy is present. Thyroid is normal. Salivary glands and ducts are within normal limits. Upper  chest: Centrilobular emphysematous changes are present. No focal nodule, mass, or airspace disease is present. Thoracic inlet is within normal limits. Review of the MIP images confirms the above findings CTA HEAD FINDINGS Anterior circulation: Atherosclerotic calcifications are present within the cavernous internal carotid arteries bilaterally. No significant stenosis is present through the ICA termini. The right A1 segment is aplastic. Both A2 segments fill from the left with a patent anterior communicating artery. M1 segments are normal. MCA bifurcations are within normal  limits bilaterally. Segmental narrowing of MCA branch vessels is noted bilaterally without a significant proximal stenosis or occlusion. Posterior circulation: The left vertebral artery is the dominant vessel. The V3 and V4 segment are hypoplastic on the right. PICA origin is visualized. No flow is present below the dural margin. The basilar artery is normal. Both posterior cerebral arteries originate from basilar tip. Venous sinuses: Dural sinuses are patent. The straight sinus and deep cerebral veins are patent. Left transverse sinus is dominant. Cortical veins are within normal limits. Anatomic variants: None Review of the MIP images confirms the above findings CT Brain Perfusion Findings: ASPECTS: Not scored due to chronic ischemia. CBF (<30%) Volume: 22mL Perfusion (Tmax>6.0s) volume: 50mL Mismatch Volume: 46mL Infarction Location:n/a IMPRESSION: 1. 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 2. Hypoplastic right V3 and V4 segments. 3. No significant stenosis of the dominant left vertebral artery in the neck or head 4. Normal CTA appearance of the carotid arteries in the neck bilaterally. 5. Moderate diffuse small vessel disease throughout the circle-of-Willis without a significant proximal stenosis, aneurysm, or branch vessel occlusion. 6. Multilevel spondylosis of the cervical spine. 7. Aortic  Atherosclerosis (ICD10-I70.0) and Emphysema (ICD10-J43.9). Electronically Signed   By: San Morelle M.D.   On: 08/23/2019 15:25   DG Chest Port 1 View  Result Date: 08/23/2019 CLINICAL DATA:  AMS EXAM: PORTABLE CHEST 1 VIEW COMPARISON:  07/02/2019 FINDINGS: The heart size and mediastinal contours are within normal limits. Both lungs are clear. The visualized skeletal structures are unremarkable. IMPRESSION: No acute abnormality of the lungs in AP portable projection. Electronically Signed   By: Eddie Candle M.D.   On: 08/23/2019 15:55   CT HEAD CODE STROKE WO CONTRAST  Result Date: 08/23/2019 CLINICAL DATA:  Code stroke.  Aphasia. EXAM: CT HEAD WITHOUT CONTRAST TECHNIQUE: Contiguous axial images were obtained from the base of the skull through the vertex without intravenous contrast. COMPARISON:  Head MRI 07/29/2019 FINDINGS: Brain: No acute cortically based infarct, intracranial hemorrhage, mass, midline shift, or extra-axial fluid collection is identified. Patchy to confluent hypodensities in the cerebral white matter bilaterally are nonspecific but compatible with extensive chronic small vessel ischemic disease. Chronic infarcts are again seen in the deep gray nuclei and deep cerebral white matter bilaterally as well as in the right cerebellum. There is moderate cerebral atrophy. A 1.5 cm partially calcified extra-axial mass over the right frontal convexity is unchanged without associated edema. Vascular: Calcified atherosclerosis at the skull base. No hyperdense vessel. Skull: No fracture or suspicious osseous lesion. Sinuses/Orbits: Minimal mucosal thickening in the included paranasal sinuses. Clear mastoid air cells. Unremarkable included orbits. Other: None. ASPECTS [facility] Stroke Program Early CT Score) Not scored due to extensive chronic ischemia. IMPRESSION: 1. No evidence of acute intracranial abnormality. 2. Extensive chronic small vessel ischemic disease. 3. Unchanged 1.5 cm right  frontal meningioma. These results were communicated to Dr. Lorrin Goodell at 2:51 pm on 08/23/2019 by text page via the [facility] messaging system. Electronically Signed   By: Logan Bores M.D.   On: 08/23/2019 14:51    Assessment/Plan  1. Acute encephalopathy -Thought to be secondary to marijuana and possibly due to continued antihypertensive medications which were previously discontinued. -CT showed no acute changes since last scan -Now resolved  2. AKI (acute kidney injury) ([place]) . Lab Results  Component Value Date   NA 137 08/24/2019   K 3.6 08/24/2019   CO2 27 08/24/2019   BUN 16 08/24/2019  CREATININE 1.11 08/24/2019   CALCIUM 9.0 08/24/2019   GLUCOSE 108 (H) 08/24/2019   -Was given 1 L bolus of LR -Now resolved  3. History of CVA (cerebrovascular accident) -Has right-sided weakness -Continue atorvastatin and Plavix  4. Essential hypertension -Stable, continue triamterene-HCTZ and amlodipine -Monitor BPs   5. Neuropathy -Continue gabapentin  6. Gastroesophageal reflux disease without esophagitis -Continue pantoprazole     Family/ staff Communication: Discussed plan of care with resident and charge nurse.  Labs/tests ordered: None  Goals of care:   Short-term care   Durenda Age, DNP, MSN, FNP-BC [facility] [phone] (Monday-Friday 8:00 a.m. - 5:00 p.m.) [phone] (after hours)

## 2019-08-31 ENCOUNTER — Encounter: Payer: Self-pay | Admitting: Internal Medicine

## 2019-08-31 ENCOUNTER — Non-Acute Institutional Stay (SKILLED_NURSING_FACILITY): Payer: Medicare Other | Admitting: Internal Medicine

## 2019-08-31 DIAGNOSIS — R29818 Other symptoms and signs involving the nervous system: Secondary | ICD-10-CM | POA: Diagnosis not present

## 2019-08-31 DIAGNOSIS — R4189 Other symptoms and signs involving cognitive functions and awareness: Secondary | ICD-10-CM

## 2019-08-31 DIAGNOSIS — N179 Acute kidney failure, unspecified: Secondary | ICD-10-CM | POA: Diagnosis not present

## 2019-08-31 DIAGNOSIS — G9340 Encephalopathy, unspecified: Secondary | ICD-10-CM | POA: Diagnosis not present

## 2019-08-31 DIAGNOSIS — I10 Essential (primary) hypertension: Secondary | ICD-10-CM | POA: Diagnosis not present

## 2019-08-31 NOTE — Assessment & Plan Note (Signed)
Today 08/31/2019 he gives the date as "July 2, 20 something" He is an extremely poor historian

## 2019-08-31 NOTE — Assessment & Plan Note (Signed)
Clinical picture today is 1 of dementia rather than encephalopathy.

## 2019-08-31 NOTE — Assessment & Plan Note (Signed)
Monitor blood pressure.  Avoid nephrotoxic medications.

## 2019-08-31 NOTE — Patient Instructions (Signed)
See assessment and plan under each diagnosis in the problem list and acutely for this visit 

## 2019-08-31 NOTE — Progress Notes (Signed)
NURSING HOME LOCATION:  [facility] ROOM NUMBER:  [id number]  CODE STATUS:  DNR  PCP:  Mitzi Hansen, MD  [address] [place] [address]   This is a [place] readmission within 30 days.   Interim medical record and care since last [place] visit was updated with review of diagnostic studies and change in clinical status since last visit were documented.  HPI: The patient was rehospitalized 7/25-7/29/2021 with acute encephalopathy in the context of marijuana use.  Apparently he had smoked marijuana at approximately noon and began to have symptoms several hours later.  CT scan revealed no acute changes since prior scan. The patient had reinitiated antihypertensives which had been held because of drug-induced hypotension with recrudescence of prior stroke symptoms for which he had been admitted 6/30.  The admission history and physical 7/25 states that these were to have been discontinued at the time of discharge at the previous hospitalization.  At admission he had the prescription bottles; his wife reported he had continued to take them after he returned home from the SNF.  Although he was normotensive at admission; there was concern that he may have had intermittent hypotension at home.  He was again educated that the antihypertensive medications should be discontinued. He also exhibited AKI with creatinine 1.27 and BUN 26 which resolved with 1 L bolus of lactated Ringer's. PT/OT recommended SNF placement for rehab.  Complete abstinence from marijuana was stressed. It was also emphasized that he should not take the previously prescribed antihypertensive medications;yet at discharge amlodipine 10 mg daily and triamterene/hydrochlorothiazide 37.5/25 mg were listed as active medications.    Review of systems: Date given as "July 2, 20 something". He complains of being "weak down there" as he runs his left hand over the left thigh area.  He also describes  some burning pain intermittently lasting seconds in the left flank area. Apparently this may be worse with rotation.  He states it is nonradiating.  Its not associated with any incontinence of stool or urine.  He denies any associated numbness or tingling. He "reckons" he may be short of breath.  He has a nonproductive cough.  Constitutional: No fever, significant weight change, fatigue  Eyes: No redness, discharge, pain, vision change ENT/mouth: No nasal congestion,  purulent discharge, earache, change in hearing, sore throat  Cardiovascular: No chest pain, palpitations, paroxysmal nocturnal dyspnea, edema  Respiratory: No cough, sputum production, hemoptysis, significant snoring, apnea   Gastrointestinal: No heartburn, dysphagia, abdominal pain, nausea /vomiting, rectal bleeding, melena, change in bowels Genitourinary: No dysuria, hematuria, pyuria, incontinence, nocturia Musculoskeletal: No joint stiffness, joint swelling Dermatologic: No rash, pruritus, change in appearance of skin Neurologic: No dizziness, headache, syncope, seizures Psychiatric: No significant anxiety, depression, insomnia, anorexia Endocrine: No change in hair/skin/nails, excessive thirst, excessive hunger, excessive urination  Hematologic/lymphatic: No significant bruising, lymphadenopathy, abnormal bleeding Allergy/immunology: No itchy/watery eyes, significant sneezing, urticaria, angioedema  Physical exam:  Pertinent or positive findings: He appears suboptimally nourished.  He is thin with atrophy of the extremities, especially the lower extremities.  Arcus senilis is present.  He has a full beard and mustache.  He is missing anterior maxillary teeth.  Slight S4 gallop is present.  Pedal pulses are decreased.  Clubbing of the nails beds is suggested.  He has intermittent tremor of the right foot.  He is weak to opposition in all extremities.  General appearance:  no acute distress, increased work of breathing is  present.   Lymphatic:  No lymphadenopathy about the head, neck, axilla. Eyes: No conjunctival inflammation or lid edema is present. There is no scleral icterus. Ears:  External ear exam shows no significant lesions or deformities.   Nose:  External nasal examination shows no deformity or inflammation. Nasal mucosa are pink and moist without lesions, exudates Oral exam: There is no oropharyngeal erythema or exudate. Neck:  No thyromegaly, masses, tenderness noted.    Heart:  No murmur, click, rub .  Lungs: Chest clear to auscultation without wheezes, rhonchi, rales, rubs. Abdomen: Bowel sounds are normal. Abdomen is soft and nontender with no organomegaly, hernias, masses. GU: Deferred  Extremities:  No cyanosis, edema  Neurologic exam :Balance, Rhomberg, finger to nose testing could not be completed due to clinical state Skin: Warm & dry w/o tenting. No significant lesions or rash.  See summary under each active problem in the Problem List with associated updated therapeutic plan

## 2019-08-31 NOTE — Assessment & Plan Note (Addendum)
08/23/2019 H&P stated that he was on amlodipine and triamterene/HCTZ which were supposed to have been discontinued. These were included in discharge medications. Presently blood pressure is adequately controlled without hypotension on this regimen.  Amlodipine would be decreased to 5 mg and HCTZ discontinued if hypotension recurs.

## 2019-09-02 NOTE — Addendum Note (Signed)
Addended by: Truddie Crumble on: 09/02/2019 11:50 AM   Modules accepted: Orders

## 2019-09-06 ENCOUNTER — Other Ambulatory Visit: Payer: Self-pay | Admitting: Adult Health

## 2019-09-06 DIAGNOSIS — K219 Gastro-esophageal reflux disease without esophagitis: Secondary | ICD-10-CM

## 2019-09-08 ENCOUNTER — Other Ambulatory Visit: Payer: Self-pay | Admitting: Adult Health

## 2019-09-09 DIAGNOSIS — R2689 Other abnormalities of gait and mobility: Secondary | ICD-10-CM | POA: Diagnosis not present

## 2019-09-09 DIAGNOSIS — I639 Cerebral infarction, unspecified: Secondary | ICD-10-CM | POA: Diagnosis not present

## 2019-09-09 DIAGNOSIS — Z741 Need for assistance with personal care: Secondary | ICD-10-CM | POA: Diagnosis not present

## 2019-09-09 DIAGNOSIS — M6281 Muscle weakness (generalized): Secondary | ICD-10-CM | POA: Diagnosis not present

## 2019-09-10 ENCOUNTER — Encounter: Payer: Self-pay | Admitting: Adult Health

## 2019-09-10 ENCOUNTER — Non-Acute Institutional Stay (SKILLED_NURSING_FACILITY): Payer: Medicare Other | Admitting: Adult Health

## 2019-09-10 DIAGNOSIS — G9340 Encephalopathy, unspecified: Secondary | ICD-10-CM

## 2019-09-10 DIAGNOSIS — Z8673 Personal history of transient ischemic attack (TIA), and cerebral infarction without residual deficits: Secondary | ICD-10-CM | POA: Diagnosis not present

## 2019-09-10 DIAGNOSIS — K219 Gastro-esophageal reflux disease without esophagitis: Secondary | ICD-10-CM | POA: Diagnosis not present

## 2019-09-10 DIAGNOSIS — I10 Essential (primary) hypertension: Secondary | ICD-10-CM | POA: Diagnosis not present

## 2019-09-10 DIAGNOSIS — G629 Polyneuropathy, unspecified: Secondary | ICD-10-CM | POA: Diagnosis not present

## 2019-09-10 DIAGNOSIS — R5381 Other malaise: Secondary | ICD-10-CM

## 2019-09-10 NOTE — Progress Notes (Signed)
Location:  [facility] Room Number: 315-A Place of Service:  SNF (31) Provider:  Durenda Age, DNP, FNP-BC  Patient Care Team: Mitzi Hansen, MD as PCP - General  Extended Emergency Contact Information Primary Emergency Contact: Marut,Bonnie Address: [place], [place] [address] [place] Phone: [phone] Relation: Spouse  Code Status:  DNR  Goals of care: Advanced Directive information Advanced Directives 09/10/2019  Does Patient Have a Medical Advance Directive? Yes  Type of Advance Directive Out of facility DNR (pink MOST or yellow form)  Does patient want to make changes to medical advance directive? No - Patient declined  Would patient like information on creating a medical advance directive? -  Pre-existing out of facility DNR order (yellow form or pink MOST form) -     Chief Complaint  Patient presents with  . Discharge Note    Patient is seen for discharge    HPI:  Pt is a 69 y.o. male who is for discharge home on 09/13/19 with Home health PT and OT.  He was admitted to [facility] on 08/27/2019 post hospitalization 08/23/2019 to 08/27/2019 for acute encephalopathy secondary to possibly due to continued antihypertensive medications.  He apparently smoked marijuana on the noon of the day of admission to the hospital, began to have altered mental status.  CT showed no acute changes since last scan.  UDS showed marijuana.  Creatinine was 1.57 and BUN 26 at presentation to the hospital.  He was given 1 L bolus of LR.  On second day of admission to the hospital, he did think the baseline.  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  Patient was admitted to this facility for short-term rehabilitation after the patient's recent hospitalization.  Patient has completed SNF  rehabilitation and therapy has cleared the patient for discharge.   Past Medical History:  Diagnosis Date  . Alcohol abuse   . Arthropathy    shoulder  . Chest pain   . Chronic cough   . Foot pain    bilateral  . Hyperlipidemia   . Hypertension   . Intermittent claudication (HCC)    bilateral  . Latent tuberculosis   . Lumbago   . Shoulder pain    Past Surgical History:  Procedure Laterality Date  . APPENDECTOMY    . HAMMER TOE SURGERY     right 2nd. and 5th  . LAMINECTOMY  1989-1991   left lumbar     Allergies  Allergen Reactions  . Aspirin Other (See Comments)    Stomach pain/ dizziness  . Lisinopril Other (See Comments)    Acute kidney insufficiency in 2013 (Cr elevated from 1.00 to 1.79    Outpatient Encounter Medications as of 09/10/2019  Medication Sig  . amLODipine (NORVASC) 10 MG tablet Take 10 mg by mouth daily.  Marland Kitchen aspirin EC 81 MG tablet Take 81 mg by mouth daily. Swallow whole.  Marland Kitchen atorvastatin (LIPITOR) 80 MG tablet TAKE ONE (1) TABLET BY MOUTH EVERY DAY AT SIX IN THE EVENING  . bisacodyl (DULCOLAX) 10 MG suppository Place 10 mg rectally daily as needed for moderate constipation.  . clopidogrel (PLAVIX) 75 MG tablet TAKE 1 TABLET BY MOUTH EVERY DAY  . gabapentin (NEURONTIN) 400 MG capsule Take 1 capsule (400 mg total) by mouth 3 (three) times daily.  Marland Kitchen  Lidocaine (ASPERCREME LIDOCAINE) 4 % PTCH Apply 1 patch topically daily as needed (Pain).  . magnesium hydroxide (MILK OF MAGNESIA) 400 MG/5ML suspension Take 30 mLs by mouth daily as needed for mild constipation.  . pantoprazole (PROTONIX) 20 MG tablet TAKE ONE (1) TABLET BY MOUTH EVERY DAY  . SODIUM PHOSPHATES RE Place 1 each rectally daily as needed (Constipation).  . triamterene-hydrochlorothiazide (DYAZIDE) 37.5-25 MG capsule Take 1 capsule by mouth daily.   No facility-administered encounter medications on file as of 09/10/2019.    Review of Systems  GENERAL: No change in appetite, no fatigue, no  weight changes, no fever, chills or weakness MOUTH and THROAT: Denies oral discomfort, gingival pain or bleeding RESPIRATORY: no cough, SOB, DOE, wheezing, hemoptysis CARDIAC: No chest pain, edema or palpitations GI: No abdominal pain, diarrhea, constipation, heart burn, nausea or vomiting GU: Denies dysuria, frequency, hematuria, incontinence, or discharge NEUROLOGICAL: Denies dizziness, syncope, numbness, or headache PSYCHIATRIC: Denies feelings of depression or anxiety. No report of hallucinations, insomnia, paranoia, or agitation   Immunization History  Administered Date(s) Administered  . Fluad Quad(high Dose 65+) 01/05/2019  . Influenza,inj,Quad PF,6+ Mos 11/07/2016, 10/01/2018  . Moderna SARS-COVID-2 Vaccination 02/05/2019, 04/13/2019  . Pneumococcal Conjugate-13 04/04/2016  . Pneumococcal Polysaccharide-23 01/03/2019  . Tdap 03/12/2013   Pertinent  Health Maintenance Due  Topic Date Due  . INFLUENZA VACCINE  08/30/2019  . COLONOSCOPY  08/29/2026  . PNA vac Low Risk Adult  Completed   Fall Risk  07/27/2019 07/14/2019 07/07/2019 03/17/2019 02/24/2019  Falls in the past year? 1 1 1 1 1   Number falls in past yr: 1 1 1  0 0  Comment 6-7 - - - -  Injury with Fall? 0 0 0 0 0  Risk Factor Category  - - - - -  Risk for fall due to : History of fall(s);Impaired balance/gait;Impaired mobility History of fall(s);Impaired balance/gait;Impaired mobility - History of fall(s);Impaired balance/gait;Impaired mobility History of fall(s);Impaired balance/gait;Impaired mobility  Risk for fall due to: Comment - - - - -  Follow up Falls prevention discussed Falls prevention discussed - Falls evaluation completed;Falls prevention discussed Falls prevention discussed     Vitals:   09/10/19 1206  BP: 132/72  Pulse: 78  Resp: 20  Temp: (!) 97.1 F (36.2 C)  TempSrc: Oral  Weight: 153 lb 3.2 oz (69.5 kg)  Height: 5\' 11"  (1.803 m)   Body mass index is 21.37 kg/m.  Physical Exam  GENERAL  APPEARANCE: Well nourished. In no acute distress. Normal body habitus SKIN:  Skin is warm and dry.  MOUTH and THROAT: Lips are without lesions. Oral mucosa is moist and without lesions. Tongue is normal in shape, size, and color and without lesions RESPIRATORY: Breathing is even & unlabored, BS CTAB CARDIAC: RRR, no murmur,no extra heart sounds, no edema GI: Abdomen soft, normal BS, no masses, no tenderness EXTREMITIES:  Able to move X 4 extremities NEUROLOGICAL: There is no tremor. Speech is clear.  Alert to self and place, disoriented to time.  Right-sided weakness PSYCHIATRIC:  Affect and behavior are appropriate  Labs reviewed: Recent Labs    07/07/19 0952 07/14/19 1110 07/31/19 0723 07/31/19 0723 08/23/19 1438 08/23/19 1443 08/24/19 0043  NA 137   < > 135   < > 139 141 137  K 3.1*   < > 3.9   < > 3.7 3.4* 3.6  CL 99   < > 102   < > 101 101 103  CO2 28   < > 23  --  25  --  27  GLUCOSE 95   < > 130*   < > 112* 111* 108*  BUN 16   < > 16   < > 22 26* 16  CREATININE 1.02   < > 1.26*   < > 1.27* 1.20 1.11  CALCIUM 9.3   < > 9.1  --  9.4  --  9.0  MG 2.1  --   --   --   --   --   --   PHOS 2.9  --   --   --   --   --   --    < > = values in this interval not displayed.   Recent Labs    01/01/19 1416 01/02/19 0444 08/23/19 1438  AST 22 23 26   ALT 17 14 22   ALKPHOS 83 74 79  BILITOT 0.4 0.7 0.9  PROT 7.9 6.8 8.0  ALBUMIN 3.9 3.4* 4.1   Recent Labs    07/02/19 1642 07/02/19 1642 07/30/19 0329 07/30/19 0329 08/23/19 1438 08/23/19 1443 08/24/19 0043  WBC 8.6   < > 8.6  --  10.0  --  9.4  NEUTROABS 4.0  --   --   --  4.2  --   --   HGB 13.5   < > 13.1   < > 13.8 15.3 11.6*  HCT 40.8   < > 38.7*   < > 42.5 45.0 36.4*  MCV 85.4   < > 83.8  --  87.1  --  86.1  PLT 248   < > 209  --  245  --  216   < > = values in this interval not displayed.   Lab Results  Component Value Date   TSH 1.995 08/24/2019   Lab Results  Component Value Date   HGBA1C 6.4 (H)  07/30/2019   Lab Results  Component Value Date   CHOL 124 07/30/2019   HDL 36 (L) 07/30/2019   LDLCALC 74 07/30/2019   TRIG 70 07/30/2019   CHOLHDL 3.4 07/30/2019    Significant Diagnostic Results in last 30 days:  EEG  Result Date: 08/24/2019 Lora Havens, MD     08/24/2019  9:03 AM Patient Name: Stephen [name] MRN: [id number] Epilepsy Attending: Lora Havens Referring Physician/Provider: Dr Nathanial Rancher Date: 08/24/2019 Duration: 26 mins Patient history: 69 y.o. male with history of HTN, HLD, PVD and stroke presented with speech disturbance, ams and weakness, EEG to evaluate for seizure. Level of alertness: Awake AEDs during EEG study: None Technical aspects: This EEG study was done with scalp electrodes positioned according to the 10-20 International system of electrode placement. Electrical activity was acquired at a sampling rate of 500Hz  and reviewed with a high frequency filter of 70Hz  and a low frequency filter of 1Hz . EEG data were recorded continuously and digitally stored. Description: The posterior dominant rhythm consists of 9 Hz activity of moderate voltage (25-35 uV) seen predominantly in posterior head regions, symmetric and reactive to eye opening and eye closing. Physiology photic driving was seen during photic stimulation.  Hyperventilation was not performed.   IMPRESSION: This study is within normal limits. No seizures or epileptiform discharges were seen throughout the recording. Lora Havens   CT Code Stroke CTA Head W/WO contrast  Result Date: 08/23/2019 CLINICAL DATA:  Code stroke.  Aphasia. EXAM: CT ANGIOGRAPHY HEAD AND NECK CT PERFUSION BRAIN TECHNIQUE: Multidetector CT imaging of the head and neck was performed using the standard protocol  during bolus administration of intravenous contrast. Multiplanar CT image reconstructions and MIPs were obtained to evaluate the vascular anatomy. Carotid stenosis measurements (when applicable) are obtained utilizing NASCET  criteria, using the distal internal carotid diameter as the denominator. Multiphase CT imaging of the brain was performed following IV bolus contrast injection. Subsequent parametric perfusion maps were calculated using RAPID software. CONTRAST:  160mL OMNIPAQUE IOHEXOL 350 MG/ML SOLN COMPARISON:  None. FINDINGS: CTA NECK FINDINGS Aortic arch: A 3 vessel arch configuration is present. Atherosclerotic changes are present at the arch without significant stenosis or aneurysm. Right carotid system: The right common carotid artery is within normal limits. Bifurcation demonstrates minimal atherosclerotic change without significant stenosis. The cervical right ICA is normal. Left carotid system: The left common carotid artery is within normal limits. Bifurcation is unremarkable. Cervical left ICA is normal. Vertebral arteries: The left vertebral artery is the dominant vessel. Mild narrowing is present at the origin of the left vertebral artery from the subclavian without a significant stenosis relative to the more distal vessel. The right vertebral artery is occluded, reconstituted at the skull base. Skeleton: Multilevel degenerative changes are present cervical spine. Uncovertebral and facet hypertrophy contribute to foraminal narrowing at C3-4 bilaterally. Other neck: The soft tissues the neck are unremarkable. No significant adenopathy is present. Thyroid is normal. Salivary glands and ducts are within normal limits. Upper chest: Centrilobular emphysematous changes are present. No focal nodule, mass, or airspace disease is present. Thoracic inlet is within normal limits. Review of the MIP images confirms the above findings CTA HEAD FINDINGS Anterior circulation: Atherosclerotic calcifications are present within the cavernous internal carotid arteries bilaterally. No significant stenosis is present through the ICA termini. The right A1 segment is aplastic. Both A2 segments fill from the left with a patent anterior  communicating artery. M1 segments are normal. MCA bifurcations are within normal limits bilaterally. Segmental narrowing of MCA branch vessels is noted bilaterally without a significant proximal stenosis or occlusion. Posterior circulation: The left vertebral artery is the dominant vessel. The V3 and V4 segment are hypoplastic on the right. PICA origin is visualized. No flow is present below the dural margin. The basilar artery is normal. Both posterior cerebral arteries originate from basilar tip. Venous sinuses: Dural sinuses are patent. The straight sinus and deep cerebral veins are patent. Left transverse sinus is dominant. Cortical veins are within normal limits. Anatomic variants: None Review of the MIP images confirms the above findings CT Brain Perfusion Findings: ASPECTS: Not scored due to chronic ischemia. CBF (<30%) Volume: 64mL Perfusion (Tmax>6.0s) volume: 56mL Mismatch Volume: 54mL Infarction Location:n/a IMPRESSION: 1. 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 2. Hypoplastic right V3 and V4 segments. 3. No significant stenosis of the dominant left vertebral artery in the neck or head 4. Normal CTA appearance of the carotid arteries in the neck bilaterally. 5. Moderate diffuse small vessel disease throughout the circle-of-Willis without a significant proximal stenosis, aneurysm, or branch vessel occlusion. 6. Multilevel spondylosis of the cervical spine. 7. Aortic Atherosclerosis (ICD10-I70.0) and Emphysema (ICD10-J43.9). Electronically Signed   By: San Morelle M.D.   On: 08/23/2019 15:25   CT Code Stroke CTA Neck W/WO contrast  Result Date: 08/23/2019 CLINICAL DATA:  Code stroke.  Aphasia. EXAM: CT ANGIOGRAPHY HEAD AND NECK CT PERFUSION BRAIN TECHNIQUE: Multidetector CT imaging of the head and neck was performed using the standard protocol during bolus administration of intravenous contrast. Multiplanar CT image reconstructions and MIPs were  obtained to  evaluate the vascular anatomy. Carotid stenosis measurements (when applicable) are obtained utilizing NASCET criteria, using the distal internal carotid diameter as the denominator. Multiphase CT imaging of the brain was performed following IV bolus contrast injection. Subsequent parametric perfusion maps were calculated using RAPID software. CONTRAST:  175mL OMNIPAQUE IOHEXOL 350 MG/ML SOLN COMPARISON:  None. FINDINGS: CTA NECK FINDINGS Aortic arch: A 3 vessel arch configuration is present. Atherosclerotic changes are present at the arch without significant stenosis or aneurysm. Right carotid system: The right common carotid artery is within normal limits. Bifurcation demonstrates minimal atherosclerotic change without significant stenosis. The cervical right ICA is normal. Left carotid system: The left common carotid artery is within normal limits. Bifurcation is unremarkable. Cervical left ICA is normal. Vertebral arteries: The left vertebral artery is the dominant vessel. Mild narrowing is present at the origin of the left vertebral artery from the subclavian without a significant stenosis relative to the more distal vessel. The right vertebral artery is occluded, reconstituted at the skull base. Skeleton: Multilevel degenerative changes are present cervical spine. Uncovertebral and facet hypertrophy contribute to foraminal narrowing at C3-4 bilaterally. Other neck: The soft tissues the neck are unremarkable. No significant adenopathy is present. Thyroid is normal. Salivary glands and ducts are within normal limits. Upper chest: Centrilobular emphysematous changes are present. No focal nodule, mass, or airspace disease is present. Thoracic inlet is within normal limits. Review of the MIP images confirms the above findings CTA HEAD FINDINGS Anterior circulation: Atherosclerotic calcifications are present within the cavernous internal carotid arteries bilaterally. No significant stenosis is present  through the ICA termini. The right A1 segment is aplastic. Both A2 segments fill from the left with a patent anterior communicating artery. M1 segments are normal. MCA bifurcations are within normal limits bilaterally. Segmental narrowing of MCA branch vessels is noted bilaterally without a significant proximal stenosis or occlusion. Posterior circulation: The left vertebral artery is the dominant vessel. The V3 and V4 segment are hypoplastic on the right. PICA origin is visualized. No flow is present below the dural margin. The basilar artery is normal. Both posterior cerebral arteries originate from basilar tip. Venous sinuses: Dural sinuses are patent. The straight sinus and deep cerebral veins are patent. Left transverse sinus is dominant. Cortical veins are within normal limits. Anatomic variants: None Review of the MIP images confirms the above findings CT Brain Perfusion Findings: ASPECTS: Not scored due to chronic ischemia. CBF (<30%) Volume: 11mL Perfusion (Tmax>6.0s) volume: 53mL Mismatch Volume: 58mL Infarction Location:n/a IMPRESSION: 1. 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 2. Hypoplastic right V3 and V4 segments. 3. No significant stenosis of the dominant left vertebral artery in the neck or head 4. Normal CTA appearance of the carotid arteries in the neck bilaterally. 5. Moderate diffuse small vessel disease throughout the circle-of-Willis without a significant proximal stenosis, aneurysm, or branch vessel occlusion. 6. Multilevel spondylosis of the cervical spine. 7. Aortic Atherosclerosis (ICD10-I70.0) and Emphysema (ICD10-J43.9). Electronically Signed   By: San Morelle M.D.   On: 08/23/2019 15:25   MR BRAIN W WO CONTRAST  Result Date: 08/24/2019 CLINICAL DATA:  Encephalopathy EXAM: MRI HEAD WITHOUT AND WITH CONTRAST TECHNIQUE: Multiplanar, multiecho pulse sequences of the brain and surrounding structures were obtained without and with  intravenous contrast. CONTRAST:  55mL GADAVIST GADOBUTROL 1 MMOL/ML IV SOLN COMPARISON:  07/29/2018 FINDINGS: BRAIN: No acute infarct, acute hemorrhage or extra-axial collection. Early confluent hyperintense T2-weighted signal of the periventricular and deep white matter, most  commonly due to chronic ischemic microangiopathy. Advanced atrophy for age. There is an old right cerebellar infarct. No chronic microhemorrhage. Normal midline structures. VASCULAR: Major flow voids are preserved. SKULL AND UPPER CERVICAL SPINE: Normal calvarium and skull base. Visualized upper cervical spine and soft tissues are normal. SINUSES/ORBITS: No paranasal sinus fluid levels or advanced mucosal thickening. No mastoid or middle ear effusion. Normal orbits. IMPRESSION: 1. No acute intracranial abnormality. 2. Advanced atrophy and chronic ischemic microangiopathy. 3. Old right cerebellar infarct. Electronically Signed   By: Ulyses Jarred M.D.   On: 08/24/2019 00:43   CT Code Stroke Cerebral Perfusion with contrast  Result Date: 08/23/2019 CLINICAL DATA:  Code stroke.  Aphasia. EXAM: CT ANGIOGRAPHY HEAD AND NECK CT PERFUSION BRAIN TECHNIQUE: Multidetector CT imaging of the head and neck was performed using the standard protocol during bolus administration of intravenous contrast. Multiplanar CT image reconstructions and MIPs were obtained to evaluate the vascular anatomy. Carotid stenosis measurements (when applicable) are obtained utilizing NASCET criteria, using the distal internal carotid diameter as the denominator. Multiphase CT imaging of the brain was performed following IV bolus contrast injection. Subsequent parametric perfusion maps were calculated using RAPID software. CONTRAST:  124mL OMNIPAQUE IOHEXOL 350 MG/ML SOLN COMPARISON:  None. FINDINGS: CTA NECK FINDINGS Aortic arch: A 3 vessel arch configuration is present. Atherosclerotic changes are present at the arch without significant stenosis or aneurysm. Right carotid  system: The right common carotid artery is within normal limits. Bifurcation demonstrates minimal atherosclerotic change without significant stenosis. The cervical right ICA is normal. Left carotid system: The left common carotid artery is within normal limits. Bifurcation is unremarkable. Cervical left ICA is normal. Vertebral arteries: The left vertebral artery is the dominant vessel. Mild narrowing is present at the origin of the left vertebral artery from the subclavian without a significant stenosis relative to the more distal vessel. The right vertebral artery is occluded, reconstituted at the skull base. Skeleton: Multilevel degenerative changes are present cervical spine. Uncovertebral and facet hypertrophy contribute to foraminal narrowing at C3-4 bilaterally. Other neck: The soft tissues the neck are unremarkable. No significant adenopathy is present. Thyroid is normal. Salivary glands and ducts are within normal limits. Upper chest: Centrilobular emphysematous changes are present. No focal nodule, mass, or airspace disease is present. Thoracic inlet is within normal limits. Review of the MIP images confirms the above findings CTA HEAD FINDINGS Anterior circulation: Atherosclerotic calcifications are present within the cavernous internal carotid arteries bilaterally. No significant stenosis is present through the ICA termini. The right A1 segment is aplastic. Both A2 segments fill from the left with a patent anterior communicating artery. M1 segments are normal. MCA bifurcations are within normal limits bilaterally. Segmental narrowing of MCA branch vessels is noted bilaterally without a significant proximal stenosis or occlusion. Posterior circulation: The left vertebral artery is the dominant vessel. The V3 and V4 segment are hypoplastic on the right. PICA origin is visualized. No flow is present below the dural margin. The basilar artery is normal. Both posterior cerebral arteries originate from basilar  tip. Venous sinuses: Dural sinuses are patent. The straight sinus and deep cerebral veins are patent. Left transverse sinus is dominant. Cortical veins are within normal limits. Anatomic variants: None Review of the MIP images confirms the above findings CT Brain Perfusion Findings: ASPECTS: Not scored due to chronic ischemia. CBF (<30%) Volume: 73mL Perfusion (Tmax>6.0s) volume: 8mL Mismatch Volume: 47mL Infarction Location:n/a IMPRESSION: 1. Occlusion of the right vertebral artery at its origin, reconstituted at the skull  base. 2. Hypoplastic right V3 and V4 segments. 3. No significant stenosis of the dominant left vertebral artery in the neck or head 4. Normal CTA appearance of the carotid arteries in the neck bilaterally. 5. Moderate diffuse small vessel disease throughout the circle-of-Willis without a significant proximal stenosis, aneurysm, or branch vessel occlusion. 6. Multilevel spondylosis of the cervical spine. 7. Aortic Atherosclerosis (ICD10-I70.0) and Emphysema (ICD10-J43.9). Electronically Signed   By: San Morelle M.D.   On: 08/23/2019 15:25   DG Chest Port 1 View  Result Date: 08/23/2019 CLINICAL DATA:  AMS EXAM: PORTABLE CHEST 1 VIEW COMPARISON:  07/02/2019 FINDINGS: The heart size and mediastinal contours are within normal limits. Both lungs are clear. The visualized skeletal structures are unremarkable. IMPRESSION: No acute abnormality of the lungs in AP portable projection. Electronically Signed   By: Eddie Candle M.D.   On: 08/23/2019 15:55   CT HEAD CODE STROKE WO CONTRAST  Result Date: 08/23/2019 CLINICAL DATA:  Code stroke.  Aphasia. EXAM: CT HEAD WITHOUT CONTRAST TECHNIQUE: Contiguous axial images were obtained from the base of the skull through the vertex without intravenous contrast. COMPARISON:  Head MRI 07/29/2019 FINDINGS: Brain: No acute cortically based infarct, intracranial hemorrhage, mass, midline shift, or extra-axial fluid collection is identified. Patchy to  confluent hypodensities in the cerebral white matter bilaterally are nonspecific but compatible with extensive chronic small vessel ischemic disease. Chronic infarcts are again seen in the deep gray nuclei and deep cerebral white matter bilaterally as well as in the right cerebellum. There is moderate cerebral atrophy. A 1.5 cm partially calcified extra-axial mass over the right frontal convexity is unchanged without associated edema. Vascular: Calcified atherosclerosis at the skull base. No hyperdense vessel. Skull: No fracture or suspicious osseous lesion. Sinuses/Orbits: Minimal mucosal thickening in the included paranasal sinuses. Clear mastoid air cells. Unremarkable included orbits. Other: None. ASPECTS [facility] Stroke Program Early CT Score) Not scored due to extensive chronic ischemia. IMPRESSION: 1. No evidence of acute intracranial abnormality. 2. Extensive chronic small vessel ischemic disease. 3. Unchanged 1.5 cm right frontal meningioma. These results were communicated to Dr. Lorrin Goodell at 2:51 pm on 08/23/2019 by text page via the [facility] messaging system. Electronically Signed   By: Logan Bores M.D.   On: 08/23/2019 14:51    Assessment/Plan  1. Acute encephalopathy -Thought to be secondary to marijuana and possibly due to medications which were previously discontinued -CT showed no acute changes since last scan -Now resolved  2. History of CVA (cerebrovascular accident) - stable, has right-sided weakness - atorvastatin (LIPITOR) 80 MG tablet; TAKE ONE (1) TABLET BY MOUTH EVERY DAY AT SIX IN THE EVENING  Dispense: 30 tablet; Refill: 0 - clopidogrel (PLAVIX) 75 MG tablet; TAKE 1 TABLET BY MOUTH EVERY DAY  Dispense: 30 tablet; Refill: 0  3. Essential hypertension - amLODipine (NORVASC) 10 MG tablet; Take 1 tablet (10 mg total) by mouth daily.  Dispense: 30 tablet; Refill: 0 - triamterene-hydrochlorothiazide (DYAZIDE) 37.5-25 MG capsule; Take 1 each (1 capsule total) by mouth daily.   Dispense: 30 capsule; Refill: 0  4. Neuropathy - gabapentin (NEURONTIN) 400 MG capsule; Take 1 capsule (400 mg total) by mouth 3 (three) times daily.  Dispense: 90 capsule; Refill: 0  5. Gastroesophageal reflux disease without esophagitis - pantoprazole (PROTONIX) 20 MG tablet; TAKE ONE (1) TABLET BY MOUTH EVERY DAY  Dispense: 30 tablet; Refill: 0  6.  Physical deconditioning -For home health PT and OT, for therapeutic strengthening exercises, fall precautions     I  have filled out patient's discharge paperwork and e-prescribed medications.  Patient will have home health PT and OT.  DME provided:  None  Total discharge time: Greater than 30 minutes Greater than 50% was spent in counseling and coordination of care.   Discharge time involved coordination of the discharge process with social worker, nursing staff and therapy department. Medical justification for home health services verified.   Durenda Age, DNP, MSN, FNP-BC [facility] [phone] (Monday-Friday 8:00 a.m. - 5:00 p.m.) [phone] (after hours)

## 2019-09-11 MED ORDER — ATORVASTATIN CALCIUM 80 MG PO TABS: ORAL_TABLET | ORAL | 0 refills | Status: DC

## 2019-09-11 MED ORDER — LIDOCAINE 4 % EX PTCH: 1.0000 | MEDICATED_PATCH | Freq: Every day | CUTANEOUS | 0 refills | Status: DC | PRN

## 2019-09-11 MED ORDER — GABAPENTIN 400 MG PO CAPS: 400.0000 mg | ORAL_CAPSULE | Freq: Three times a day (TID) | ORAL | 0 refills | Status: DC

## 2019-09-11 MED ORDER — AMLODIPINE BESYLATE 10 MG PO TABS: 10.0000 mg | ORAL_TABLET | Freq: Every day | ORAL | 0 refills | Status: DC

## 2019-09-11 MED ORDER — TRIAMTERENE-HCTZ 37.5-25 MG PO CAPS: 1.0000 | ORAL_CAPSULE | Freq: Every day | ORAL | 0 refills | Status: DC

## 2019-09-11 MED ORDER — CLOPIDOGREL BISULFATE 75 MG PO TABS: ORAL_TABLET | ORAL | 0 refills | Status: DC

## 2019-09-11 MED ORDER — PANTOPRAZOLE SODIUM 20 MG PO TBEC: DELAYED_RELEASE_TABLET | ORAL | 0 refills | Status: DC

## 2019-09-14 DIAGNOSIS — Z7982 Long term (current) use of aspirin: Secondary | ICD-10-CM | POA: Diagnosis not present

## 2019-09-14 DIAGNOSIS — Z9181 History of falling: Secondary | ICD-10-CM | POA: Diagnosis not present

## 2019-09-14 DIAGNOSIS — I10 Essential (primary) hypertension: Secondary | ICD-10-CM | POA: Diagnosis not present

## 2019-09-14 DIAGNOSIS — G629 Polyneuropathy, unspecified: Secondary | ICD-10-CM | POA: Diagnosis not present

## 2019-09-14 DIAGNOSIS — E785 Hyperlipidemia, unspecified: Secondary | ICD-10-CM | POA: Diagnosis not present

## 2019-09-14 DIAGNOSIS — I69351 Hemiplegia and hemiparesis following cerebral infarction affecting right dominant side: Secondary | ICD-10-CM | POA: Diagnosis not present

## 2019-09-14 DIAGNOSIS — K219 Gastro-esophageal reflux disease without esophagitis: Secondary | ICD-10-CM | POA: Diagnosis not present

## 2019-09-14 DIAGNOSIS — I739 Peripheral vascular disease, unspecified: Secondary | ICD-10-CM | POA: Diagnosis not present

## 2019-09-17 ENCOUNTER — Encounter: Payer: Medicare Other | Admitting: Internal Medicine

## 2019-09-17 DIAGNOSIS — I10 Essential (primary) hypertension: Secondary | ICD-10-CM | POA: Diagnosis not present

## 2019-09-17 DIAGNOSIS — Z7982 Long term (current) use of aspirin: Secondary | ICD-10-CM | POA: Diagnosis not present

## 2019-09-17 DIAGNOSIS — G629 Polyneuropathy, unspecified: Secondary | ICD-10-CM | POA: Diagnosis not present

## 2019-09-17 DIAGNOSIS — Z9181 History of falling: Secondary | ICD-10-CM | POA: Diagnosis not present

## 2019-09-17 DIAGNOSIS — I69351 Hemiplegia and hemiparesis following cerebral infarction affecting right dominant side: Secondary | ICD-10-CM | POA: Diagnosis not present

## 2019-09-17 DIAGNOSIS — K219 Gastro-esophageal reflux disease without esophagitis: Secondary | ICD-10-CM | POA: Diagnosis not present

## 2019-09-17 DIAGNOSIS — I739 Peripheral vascular disease, unspecified: Secondary | ICD-10-CM | POA: Diagnosis not present

## 2019-09-17 DIAGNOSIS — E785 Hyperlipidemia, unspecified: Secondary | ICD-10-CM | POA: Diagnosis not present

## 2019-09-21 DIAGNOSIS — I739 Peripheral vascular disease, unspecified: Secondary | ICD-10-CM | POA: Diagnosis not present

## 2019-09-21 DIAGNOSIS — I10 Essential (primary) hypertension: Secondary | ICD-10-CM | POA: Diagnosis not present

## 2019-09-21 DIAGNOSIS — Z9181 History of falling: Secondary | ICD-10-CM | POA: Diagnosis not present

## 2019-09-21 DIAGNOSIS — I69351 Hemiplegia and hemiparesis following cerebral infarction affecting right dominant side: Secondary | ICD-10-CM | POA: Diagnosis not present

## 2019-09-21 DIAGNOSIS — G629 Polyneuropathy, unspecified: Secondary | ICD-10-CM | POA: Diagnosis not present

## 2019-09-21 DIAGNOSIS — K219 Gastro-esophageal reflux disease without esophagitis: Secondary | ICD-10-CM | POA: Diagnosis not present

## 2019-09-21 DIAGNOSIS — Z7982 Long term (current) use of aspirin: Secondary | ICD-10-CM | POA: Diagnosis not present

## 2019-09-21 DIAGNOSIS — E785 Hyperlipidemia, unspecified: Secondary | ICD-10-CM | POA: Diagnosis not present

## 2019-09-22 DIAGNOSIS — Z7982 Long term (current) use of aspirin: Secondary | ICD-10-CM | POA: Diagnosis not present

## 2019-09-22 DIAGNOSIS — Z9181 History of falling: Secondary | ICD-10-CM | POA: Diagnosis not present

## 2019-09-22 DIAGNOSIS — E785 Hyperlipidemia, unspecified: Secondary | ICD-10-CM | POA: Diagnosis not present

## 2019-09-22 DIAGNOSIS — I69351 Hemiplegia and hemiparesis following cerebral infarction affecting right dominant side: Secondary | ICD-10-CM | POA: Diagnosis not present

## 2019-09-22 DIAGNOSIS — I10 Essential (primary) hypertension: Secondary | ICD-10-CM | POA: Diagnosis not present

## 2019-09-22 DIAGNOSIS — G629 Polyneuropathy, unspecified: Secondary | ICD-10-CM | POA: Diagnosis not present

## 2019-09-22 DIAGNOSIS — K219 Gastro-esophageal reflux disease without esophagitis: Secondary | ICD-10-CM | POA: Diagnosis not present

## 2019-09-22 DIAGNOSIS — I739 Peripheral vascular disease, unspecified: Secondary | ICD-10-CM | POA: Diagnosis not present

## 2019-09-23 ENCOUNTER — Encounter: Payer: Self-pay | Admitting: Internal Medicine

## 2019-09-23 ENCOUNTER — Ambulatory Visit (INDEPENDENT_AMBULATORY_CARE_PROVIDER_SITE_OTHER): Payer: Medicare Other | Admitting: Internal Medicine

## 2019-09-23 ENCOUNTER — Other Ambulatory Visit: Payer: Self-pay

## 2019-09-23 ENCOUNTER — Ambulatory Visit: Payer: Medicare Other | Admitting: *Deleted

## 2019-09-23 ENCOUNTER — Telehealth: Payer: Self-pay

## 2019-09-23 VITALS — BP 137/66 | HR 66 | Temp 98.0°F | Wt 159.3 lb

## 2019-09-23 DIAGNOSIS — I739 Peripheral vascular disease, unspecified: Secondary | ICD-10-CM | POA: Diagnosis not present

## 2019-09-23 DIAGNOSIS — I10 Essential (primary) hypertension: Secondary | ICD-10-CM

## 2019-09-23 DIAGNOSIS — E785 Hyperlipidemia, unspecified: Secondary | ICD-10-CM

## 2019-09-23 DIAGNOSIS — I69351 Hemiplegia and hemiparesis following cerebral infarction affecting right dominant side: Secondary | ICD-10-CM | POA: Diagnosis not present

## 2019-09-23 DIAGNOSIS — Z0000 Encounter for general adult medical examination without abnormal findings: Secondary | ICD-10-CM

## 2019-09-23 DIAGNOSIS — I63311 Cerebral infarction due to thrombosis of right middle cerebral artery: Secondary | ICD-10-CM

## 2019-09-23 DIAGNOSIS — K219 Gastro-esophageal reflux disease without esophagitis: Secondary | ICD-10-CM | POA: Diagnosis not present

## 2019-09-23 DIAGNOSIS — I69954 Hemiplegia and hemiparesis following unspecified cerebrovascular disease affecting left non-dominant side: Secondary | ICD-10-CM | POA: Diagnosis not present

## 2019-09-23 DIAGNOSIS — Z8673 Personal history of transient ischemic attack (TIA), and cerebral infarction without residual deficits: Secondary | ICD-10-CM

## 2019-09-23 DIAGNOSIS — Z742 Need for assistance at home and no other household member able to render care: Secondary | ICD-10-CM

## 2019-09-23 DIAGNOSIS — R531 Weakness: Secondary | ICD-10-CM

## 2019-09-23 DIAGNOSIS — G629 Polyneuropathy, unspecified: Secondary | ICD-10-CM | POA: Diagnosis not present

## 2019-09-23 DIAGNOSIS — Z9181 History of falling: Secondary | ICD-10-CM | POA: Diagnosis not present

## 2019-09-23 DIAGNOSIS — Z7982 Long term (current) use of aspirin: Secondary | ICD-10-CM | POA: Diagnosis not present

## 2019-09-23 NOTE — Progress Notes (Signed)
Internal Medicine Clinic Resident  I have personally reviewed this encounter including the documentation in this note and/or discussed this patient with the care management provider. I will address any urgent items identified by the care management provider and will communicate my actions to the patient's PCP. I have reviewed the patient's CCM visit with my supervising attending, Dr Heber [place].  Jose Persia, MD 09/23/2019

## 2019-09-23 NOTE — Assessment & Plan Note (Signed)
Blood pressure is acceptable at 137/66 today, while off of antihypertensive medications. (Triamterene-HCTZ and amlodipine discontinued in SNF due to hypotension.)  -Continue holding antihypertensive medications and will monitor blood pressure

## 2019-09-23 NOTE — Telephone Encounter (Signed)
Received fax from [facility] requesting approval for [facility] OT frequency: 1X week 1 2X/week for 3 weeks 1Xweek for 1 week VO given Dr. Mliss Fritz  Pt was seen in clinic/ yellow team/Dr. Myrtie Hawk today. SChaplin, RN,BSN

## 2019-09-23 NOTE — Chronic Care Management (AMB) (Signed)
Chronic Care Management   Initial Visit Note  09/23/2019 Name: Stephen [name] MRN: [id number] DOB: 1950/08/17  Referred by: Mitzi Hansen, MD Reason for referral : Chronic Care Management (HTN, HLD, PVD, s/p CVA with cognitive impairment)   Stephen [name] is a 69 y.o. year old male who is a primary care patient of Christian, Rylee, MD. The CCM team was consulted for assistance with chronic disease management and care coordination needs related to HTN, HLD, and stroke with neurocognitive deficits  Review of patient status, including review of consultants reports, relevant laboratory and other test results, and collaboration with appropriate care team members and the patient's provider was performed as part of comprehensive patient evaluation and provision of chronic care management services.    SDOH (Social Determinants of Health) assessments performed: No See Care Plan activities for detailed interventions related to SDOH     Medications: Outpatient Encounter Medications as of 09/23/2019  Medication Sig  . atorvastatin (LIPITOR) 80 MG tablet TAKE ONE (1) TABLET BY MOUTH EVERY DAY AT SIX IN THE EVENING  . bisacodyl (DULCOLAX) 10 MG suppository Place 10 mg rectally daily as needed for moderate constipation.  . clopidogrel (PLAVIX) 75 MG tablet TAKE 1 TABLET BY MOUTH EVERY DAY  . gabapentin (NEURONTIN) 400 MG capsule Take 1 capsule (400 mg total) by mouth 3 (three) times daily.  . Lidocaine (ASPERCREME LIDOCAINE) 4 % PTCH Apply 1 patch topically daily as needed (Pain).  . magnesium hydroxide (MILK OF MAGNESIA) 400 MG/5ML suspension Take 30 mLs by mouth daily as needed for mild constipation.  . pantoprazole (PROTONIX) 20 MG tablet TAKE ONE (1) TABLET BY MOUTH EVERY DAY  . SODIUM PHOSPHATES RE Place 1 each rectally daily as needed (Constipation).   No facility-administered encounter medications on file as of 09/23/2019.     Objective:  Wt Readings from Last 3 Encounters:  09/23/19  159 lb 4.8 oz (72.3 kg)  09/10/19 153 lb 3.2 oz (69.5 kg)  08/31/19 154 lb 5.1 oz (70 kg)      Goals Addressed              This Visit's Progress     Patient Stated   .  "I need some help in the home." (pt-stated)        CARE PLAN ENTRY (see longitudinal plan of care for additional care plan information)  Current Barriers:  . Chronic Disease Management support, education, and care coordination needs related to HTN, HLD, and stroke with neurocognitive deficits - met with patient and his wife and caregiver [name] during clinic visit, wife states she does not live with patient but provides ADL and IADL care from 9a-8p everyday. Patient is alone in his home at night. She is asking for assistance with patient's care in the home. ( She fell asleep during CCM RN interview with patient and wife.) Patient states he walks with a cane when outside the house and uses a rolling walker in the house. He says he does not drive. Patient and wife do not think he has ever applied for Medicaid.   Clinical Goal(s) related to HTN, HLD, and stroke with neurocognitive deficits:  Over the next 30-90 days, patient will:  . Work with the care management team to address educational, disease management, and care coordination needs  . Begin or continue self health monitoring activities as directed today take medications as directed . Call provider office for new or worsened signs and symptoms Chest pain and Shortness of breath  Call  care management team with questions or concerns- provided patient with [place] [employer] spiral bound calendar and business cards of CCM RN and BSW and disease management education for HTN . Contacted [place] to assist patient with Medicaid application and apply for PCS vs referral to PACE, and complete SDOH screening . Verbalize basic understanding of patient centered plan of care established today  Interventions related to HTN, HLD, and stroke  with neurocognitive deficits . Evaluation of current treatment plans and patient's adherence to plan as established by provider . Assessed patient understanding of disease states . Assessed patient's education and care coordination needs . Provided disease specific education to patient  . Collaborated with appropriate clinical care team members regarding patient needs  Patient Self Care Activities related to HTN, HLD, and stroke with neurocognitive deficits:  . Patient is unable to independently self-manage chronic health conditions  Initial goal documentation       Other   .  Blood Pressure < 140/90        BP Readings from Last 3 Encounters:  09/23/19 137/66  09/10/19 132/72  08/31/19 134/77   Meeting blood pressure targets    .  LDL CALC < 100        Lab Results  Component Value Date   CHOL 124 07/30/2019   HDL 36 (L) 07/30/2019   LDLCALC 74 07/30/2019   TRIG 70 07/30/2019   CHOLHDL 3.4 07/30/2019   Meeting most lipid targets       Stephen [name] was given information about Chronic Care Management services today including:  1. CCM service includes personalized support from designated clinical staff supervised by his physician, including individualized plan of care and coordination with other care providers 2. 24/7 contact phone numbers for assistance for urgent and routine care needs. 3. Service will only be billed when office clinical staff spend 20 minutes or more in a month to coordinate care. 4. Only one practitioner may furnish and bill the service in a calendar month. 5. The patient may stop CCM services at any time (effective at the end of the month) by phone call to the office staff. 6. The patient will be responsible for cost sharing (co-pay) of up to 20% of the service fee (after annual deductible is met).  Patient agreed to services and verbal consent obtained.   Plan:   The care management team will reach out to the patient again over the next 30 days.   [name] RN, CCM, [facility] RN Care Manager [phone]

## 2019-09-23 NOTE — Assessment & Plan Note (Signed)
Patient states that he has received COVID-19 vaccine.

## 2019-09-23 NOTE — Progress Notes (Signed)
   CC: f/u of stroke, evaluation for home health requirement  HPI:  [name] is a 69 y.o. male with PMHx as documented below, presented with . Please refer to problem based charting for further details and assessment and plan of current problem and chronic medical conditions.   Past Medical History:  Diagnosis Date  . Alcohol abuse   . Arthropathy    shoulder  . Chest pain   . Chronic cough   . Foot pain    bilateral  . Hyperlipidemia   . Hypertension   . Intermittent claudication (HCC)    bilateral  . Latent tuberculosis   . Lumbago   . Shoulder pain    Review of Systems:  Negative except mention in HPI and assessment and plan  Physical Exam:  Vitals:   09/23/19 0957  BP: 137/66  Pulse: 66  Temp: 98 F (36.7 C)  SpO2: 100%  Weight: 159 lb 4.8 oz (72.3 kg)   Constitutional: No acute distress. Head: Normocephalic and atraumatic.  Eyes: Conjunctivae are normal, EOM nl Cardiovascular: RRR, nl S1S2, no murmur,  no LEE Respiratory: Effort normal and breath sounds normal. No respiratory distress. No wheezes.  GI: Soft. No distension. There is no tenderness.  Neurological: Is alert and oriented x 3, slight left side motor weakness 4+/5 (chronic) Skin: Not diaphoretic. No erythema.  Psychiatric: Normal mood and affect. Behavior is normal. Judgment and thought content normal.   Assessment & Plan:   See Encounters Tab for problem based charting.  Patient discussed with Dr. Rebeca Alert

## 2019-09-23 NOTE — Patient Instructions (Signed)
Visit Information It was nice speaking with you and your wife today.  Goals Addressed              This Visit's Progress     Patient Stated   .  "I need some help in the home." (pt-stated)        CARE PLAN ENTRY (see longitudinal plan of care for additional care plan information)  Current Barriers:  . Chronic Disease Management support, education, and care coordination needs related to HTN, HLD, and stroke with neurocognitive deficits - met with patient and his wife [name] and caregiver during clinic visit, wife states she does not live with patient but provides ADL and IADL care from 9a-8p everyday. Patient is alone in his home at night. She is asking for assistance with patient's care in the home. (She fell asleep during CCM RN interview with patient and wife.) Patient states he walks with a cane when outside the house and uses a rolling walker in the house. He says he does not drive. Patient and wife do not think he has ever applied for Medicaid.   Clinical Goal(s) related to HTN, HLD, and stroke with neurocognitive deficits:  Over the next 30-90 days, patient will:  . Work with the care management team to address educational, disease management, and care coordination needs  . Begin or continue self health monitoring activities as directed today take medications as directed . Call provider office for new or worsened signs and symptoms Chest pain and Shortness of breath . Call care management team with questions or concerns- provided patient with [place] [employer] spiral bound calendar and business cards of CCM RN and BSW and disease management education for HTN . Contacted [place] to assist patient with Medicaid application and apply for PCS vs referral to PACE, and complete SDOH screening . Verbalize basic understanding of patient centered plan of care established today  Interventions related to HTN, HLD, and stroke with neurocognitive  deficits . Evaluation of current treatment plans and patient's adherence to plan as established by provider . Assessed patient understanding of disease states . Assessed patient's education and care coordination needs . Provided disease specific education to patient  . Collaborated with appropriate clinical care team members regarding patient needs  Patient Self Care Activities related to HTN, HLD, and stroke with neurocognitive deficits:  . Patient is unable to independently self-manage chronic health conditions  Initial goal documentation       Other   .  Blood Pressure < 140/90        BP Readings from Last 3 Encounters:  09/23/19 137/66  09/10/19 132/72  08/31/19 134/77   Meeting blood pressure targets    .  LDL CALC < 100        Lab Results  Component Value Date   CHOL 124 07/30/2019   HDL 36 (L) 07/30/2019   LDLCALC 74 07/30/2019   TRIG 70 07/30/2019   CHOLHDL 3.4 07/30/2019   Meeting most lipid targets       Mr. Bail was given information about Chronic Care Management services today including:  1. CCM service includes personalized support from designated clinical staff supervised by his physician, including individualized plan of care and coordination with other care providers 2. 24/7 contact phone numbers for assistance for urgent and routine care needs. 3. Service will only be billed when office clinical staff spend 20 minutes or more in a month to coordinate care. 4. Only one practitioner may  furnish and bill the service in a calendar month. 5. The patient may stop CCM services at any time (effective at the end of the month) by phone call to the office staff. 6. The patient will be responsible for cost sharing (co-pay) of up to 20% of the service fee (after annual deductible is met).  Patient agreed to services and verbal consent obtained.   The patient verbalized understanding of instructions provided today and declined a print copy of patient instruction  materials.   The care management team will reach out to the patient again over the next 30 days.   [name] RN, CCM, [facility] RN Care Manager [phone]

## 2019-09-23 NOTE — Patient Instructions (Signed)
Thank you for allowing [place] to provide your care today. Today we discussed your medications, your blood pressure, home health need for stroke.  1- Please stop taking aspirin 2-continue rest of your medications as before.  Call us your pharmacy for refills. 3-we will talk to health service to continue sending occupational therapist to the house 4-we also arrange CCM (chronic care management) team for follow-up  Please come back to clinic in 3 months or earlier as needed. As always, if having severe symptoms, please seek medical attention at emergency room. Should you have any questions or concerns please call the internal medicine clinic at [phone].    Thank you!

## 2019-09-23 NOTE — Assessment & Plan Note (Addendum)
Patient presented for follow-up of stroke and to assess the need for home health services. He had to stroke in 2014 and 2020.  With some residual left-sided weakness. He uses a cane and walker to be able to walk at home.  He is able to take shower. His wife helps him for her his medications.  On exam, he is alert and oriented.  He has some mild left side residual weakness. He'll take benefit of continuing occupational therapy at home.  Of note, apparently, patient is still on dual antiplatelet therapy several months post stroke.  Overlap treatment is necessary for 3 weeks only.  To decrease risk of bleeding, will stop aspirin and will continue clopidogrel.  -Verbal order sent to home health services for [facility] OT -Referral to CCM -Stop ASA -Continue clopidogrel 75 mg daily -Continue high intensity statin

## 2019-09-24 DIAGNOSIS — I69351 Hemiplegia and hemiparesis following cerebral infarction affecting right dominant side: Secondary | ICD-10-CM | POA: Diagnosis not present

## 2019-09-24 DIAGNOSIS — Z9181 History of falling: Secondary | ICD-10-CM | POA: Diagnosis not present

## 2019-09-24 DIAGNOSIS — K219 Gastro-esophageal reflux disease without esophagitis: Secondary | ICD-10-CM | POA: Diagnosis not present

## 2019-09-24 DIAGNOSIS — I10 Essential (primary) hypertension: Secondary | ICD-10-CM | POA: Diagnosis not present

## 2019-09-24 DIAGNOSIS — Z7982 Long term (current) use of aspirin: Secondary | ICD-10-CM | POA: Diagnosis not present

## 2019-09-24 DIAGNOSIS — I739 Peripheral vascular disease, unspecified: Secondary | ICD-10-CM | POA: Diagnosis not present

## 2019-09-24 DIAGNOSIS — E785 Hyperlipidemia, unspecified: Secondary | ICD-10-CM | POA: Diagnosis not present

## 2019-09-24 DIAGNOSIS — G629 Polyneuropathy, unspecified: Secondary | ICD-10-CM | POA: Diagnosis not present

## 2019-09-25 NOTE — Progress Notes (Signed)
Internal Medicine Clinic Attending  Case discussed with Dr. Masoudi at the time of the visit.  We reviewed the resident's history and exam and pertinent patient test results.  I agree with the assessment, diagnosis, and plan of care documented in the resident's note.  Delesia Martinek, M.D., Ph.D.  

## 2019-09-29 ENCOUNTER — Telehealth: Payer: Medicare Other

## 2019-09-29 ENCOUNTER — Telehealth: Payer: Self-pay

## 2019-09-29 DIAGNOSIS — Z7982 Long term (current) use of aspirin: Secondary | ICD-10-CM | POA: Diagnosis not present

## 2019-09-29 DIAGNOSIS — E785 Hyperlipidemia, unspecified: Secondary | ICD-10-CM | POA: Diagnosis not present

## 2019-09-29 DIAGNOSIS — I739 Peripheral vascular disease, unspecified: Secondary | ICD-10-CM | POA: Diagnosis not present

## 2019-09-29 DIAGNOSIS — K219 Gastro-esophageal reflux disease without esophagitis: Secondary | ICD-10-CM | POA: Diagnosis not present

## 2019-09-29 DIAGNOSIS — Z9181 History of falling: Secondary | ICD-10-CM | POA: Diagnosis not present

## 2019-09-29 DIAGNOSIS — I10 Essential (primary) hypertension: Secondary | ICD-10-CM | POA: Diagnosis not present

## 2019-09-29 DIAGNOSIS — I69351 Hemiplegia and hemiparesis following cerebral infarction affecting right dominant side: Secondary | ICD-10-CM | POA: Diagnosis not present

## 2019-09-29 DIAGNOSIS — G629 Polyneuropathy, unspecified: Secondary | ICD-10-CM | POA: Diagnosis not present

## 2019-09-29 NOTE — Telephone Encounter (Signed)
°  Chronic Care Management   Outreach Note  09/29/2019 Name: Aviv Lengacher MRN: [id number] DOB: [birth year]  Referred by: Mitzi Hansen, MD Reason for referral : Care Coordination (Caregiver resources/SDOH)  Attempted to contact patient today for scheduled Initial SW assessment; no answer and voicemail box was not set up. Contacted patient's spouse at her home number  Spouse was not with him at time of the call but reported that patient was likely participating in therapy session for the next hour. Spouse agreed to call CCM BSW back once she gets to patient's home today so that initial assessment can be rescheduled.      Ronn Melena, [place] Coordination Social Worker [place] [phone]

## 2019-10-01 ENCOUNTER — Ambulatory Visit: Payer: Medicare Other

## 2019-10-01 DIAGNOSIS — Z8673 Personal history of transient ischemic attack (TIA), and cerebral infarction without residual deficits: Secondary | ICD-10-CM

## 2019-10-01 DIAGNOSIS — I739 Peripheral vascular disease, unspecified: Secondary | ICD-10-CM | POA: Diagnosis not present

## 2019-10-01 DIAGNOSIS — K219 Gastro-esophageal reflux disease without esophagitis: Secondary | ICD-10-CM | POA: Diagnosis not present

## 2019-10-01 DIAGNOSIS — E785 Hyperlipidemia, unspecified: Secondary | ICD-10-CM

## 2019-10-01 DIAGNOSIS — Z9181 History of falling: Secondary | ICD-10-CM | POA: Diagnosis not present

## 2019-10-01 DIAGNOSIS — I10 Essential (primary) hypertension: Secondary | ICD-10-CM

## 2019-10-01 DIAGNOSIS — G629 Polyneuropathy, unspecified: Secondary | ICD-10-CM | POA: Diagnosis not present

## 2019-10-01 DIAGNOSIS — Z7982 Long term (current) use of aspirin: Secondary | ICD-10-CM | POA: Diagnosis not present

## 2019-10-01 DIAGNOSIS — I69351 Hemiplegia and hemiparesis following cerebral infarction affecting right dominant side: Secondary | ICD-10-CM | POA: Diagnosis not present

## 2019-10-01 NOTE — Chronic Care Management (AMB) (Signed)
Care Management   Follow Up Note   10/01/2019 Name: Stephen [name] MRN: [id number] DOB: Nov 06, 1950  Referred by: Mitzi Hansen, MD Reason for referral : Care Coordination (PCS, SDOH, Advance Directives)   Stephen [name] is a 69 y.o. year old male who is a primary care patient of Christian, Lucinda Dell, MD. The care management team was consulted for assistance with care management and care coordination needs.    Review of patient status, including review of consultants reports, relevant laboratory and other test results, and collaboration with appropriate care team members and the patient's provider was performed as part of comprehensive patient evaluation and provision of chronic care management services.    SDOH (Social Determinants of Health) assessments performed: No See Care Plan activities for detailed interventions related to SDOH)  SDOH Interventions     Most Recent Value  SDOH Interventions  Food Insecurity Interventions Other (Comment)  [Encouraged patient to apply for SNAP]       Advanced Directives: See Care Plan and [employer] application for related entries.   Goals Addressed              This Visit's Progress   .  "I need some help in the home." (pt-stated)        CARE PLAN ENTRY (see longitudinal plan of care for additional care plan information)  Current Barriers:  . Chronic Disease Management support, education, and care coordination needs related to HTN, HLD, and stroke with neurocognitive deficits - met with patient and his wife Stephen [name] and caregiver during clinic visit, wife states she does not live with patient but provides ADL and IADL care from 9a-8p everyday. Patient is alone in his home at night. She is asking for assistance with patient's care in the home. (She fell asleep during CCM RN interview with patient and wife.) Patient states he walks with a cane when outside the house and uses a rolling walker in the house. He says he does not drive. Patient and  wife do not think he has ever applied for Medicaid.  . 10/01/19:  Spouse states during SW assessment today that patient's condition as improved and "we don't need help in the home right now"  Clinical Goal(s) related to HTN, HLD, and stroke with neurocognitive deficits:  Over the next 30-90 days, patient will:  . Work with the care management team to address educational, disease management, and care coordination needs  . Begin or continue self health monitoring activities as directed today take medications as directed . Call provider office for new or worsened signs and symptoms Chest pain and Shortness of breath . Call care management team with questions or concerns- provided patient with [place] [employer] spiral bound calendar and business cards of CCM RN and BSW and disease management education for HTN . Contacted [place] to assist patient with Medicaid application and apply for PCS vs referral to PACE, and complete SDOH screening . Verbalize basic understanding of patient centered plan of care established today  Interventions related to HTN, HLD, and stroke with neurocognitive deficits . Informed patient and spouse/caregiver that Medicare does not cover cost of long term in-home services . Highly recommended that patient/spouse contact [place] to discuss Medicaid eligibility with caseworker due to more services being covered if patient does in fact qualify . Informed patient/spouse that application process can take up to 90 days and encouraged them to contact DSS as soon as possible in case patient or spouse/caregiver are in  need of PCS services in the future . Provided them with contact information for [place] .  Marland Kitchen Patient Self Care Activities related to HTN, HLD, and stroke with neurocognitive deficits:  . Patient is unable to independently self-manage chronic health conditions  Please see past updates related to this goal by  clicking on the "Past Updates" button in the selected goal      .  "I would like assistance with Advance Directives" (pt-stated)        [place] (see longitudinal plan of care for additional care plan information)  Current Barriers:  . Patient and spouse/caregiver do not have Advance Directive documentation   Clinical Social Work Clinical Goal(s):  Marland Kitchen Over the next 60 days, patient will work with SW to address concerns related to completion of Advance Directives  Interventions: . Inter-disciplinary care team collaboration (see longitudinal plan of care) . Talked with patient/spouse about HC POA and Living Will documentation  . Discussed the importance of completing these documents . Encouraged patient/spouse to have discussion about their wishes for life saving measures . Mailed Advance Directives packets  Patient Self Care Activities:  Patient is unable to independently self-manage chronic health conditions  Initial goal documentation         Telephone follow up appointment with care management team member scheduled for:10/15/19      Ronn Melena, [place] Coordination Social Worker [place] [phone]

## 2019-10-01 NOTE — Patient Instructions (Signed)
Visit Information  Goals Addressed              This Visit's Progress   .  "I need some help in the home." (pt-stated)        CARE PLAN ENTRY (see longitudinal plan of care for additional care plan information)  Current Barriers:  . Chronic Disease Management support, education, and care coordination needs related to HTN, HLD, and stroke with neurocognitive deficits - met with patient and his wife [name] and caregiver during clinic visit, wife states she does not live with patient but provides ADL and IADL care from 9a-8p everyday. Patient is alone in his home at night. She is asking for assistance with patient's care in the home. (She fell asleep during CCM RN interview with patient and wife.) Patient states he walks with a cane when outside the house and uses a rolling walker in the house. He says he does not drive. Patient and wife do not think he has ever applied for Medicaid.  . 10/01/19:  Spouse states during SW assessment today that patient's condition as improved and "we don't need help in the home right now"  Clinical Goal(s) related to HTN, HLD, and stroke with neurocognitive deficits:  Over the next 30-90 days, patient will:  . Work with the care management team to address educational, disease management, and care coordination needs  . Begin or continue self health monitoring activities as directed today take medications as directed . Call provider office for new or worsened signs and symptoms Chest pain and Shortness of breath . Call care management team with questions or concerns- provided patient with [place] [employer] spiral bound calendar and business cards of CCM RN and BSW and disease management education for HTN . Contacted [place] to assist patient with Medicaid application and apply for PCS vs referral to PACE, and complete SDOH screening . Verbalize basic understanding of patient centered plan of care established today  Interventions  related to HTN, HLD, and stroke with neurocognitive deficits . Informed patient and spouse/caregiver that Medicare does not cover cost of long term in-home services . Highly recommended that patient/spouse contact [place] to discuss Medicaid eligibility with caseworker due to more services being covered if patient does in fact qualify . Informed patient/spouse that application process can take up to 90 days and encouraged them to contact DSS as soon as possible in case patient or spouse/caregiver are in need of PCS services in the future . Provided them with contact information for [place] .  Marland Kitchen Patient Self Care Activities related to HTN, HLD, and stroke with neurocognitive deficits:  . Patient is unable to independently self-manage chronic health conditions  Please see past updates related to this goal by clicking on the "Past Updates" button in the selected goal      .  "I would like assistance with Advance Directives" (pt-stated)        [place] (see longitudinal plan of care for additional care plan information)  Current Barriers:  . Patient and spouse/caregiver do not have Advance Directive documentation   Clinical Social Work Clinical Goal(s):  Marland Kitchen Over the next 60 days, patient will work with SW to address concerns related to completion of Advance Directives  Interventions: . Inter-disciplinary care team collaboration (see longitudinal plan of care) . Talked with patient/spouse about HC POA and Living Will documentation  . Discussed the importance of completing these documents . Encouraged patient/spouse to have  discussion about their wishes for life saving measures . Mailed Advance Directives packets  Patient Self Care Activities:  Patient is unable to independently self-manage chronic health conditions  Initial goal documentation        Patient verbalizes understanding of instructions provided today.   Telephone follow up appointment with  care management team member scheduled for:10/15/19     Ronn Melena, [place] Coordination Social Worker [place] [phone]

## 2019-10-02 ENCOUNTER — Ambulatory Visit: Payer: Medicare Other | Admitting: *Deleted

## 2019-10-02 DIAGNOSIS — Z8673 Personal history of transient ischemic attack (TIA), and cerebral infarction without residual deficits: Secondary | ICD-10-CM

## 2019-10-02 DIAGNOSIS — E785 Hyperlipidemia, unspecified: Secondary | ICD-10-CM

## 2019-10-02 DIAGNOSIS — I10 Essential (primary) hypertension: Secondary | ICD-10-CM

## 2019-10-02 NOTE — Progress Notes (Signed)
Internal Medicine Clinic Resident  I have personally reviewed this encounter including the documentation in this note and/or discussed this patient with the care management provider. I will address any urgent items identified by the care management provider and will communicate my actions to the patient's PCP. I have reviewed the patient's CCM visit with my supervising attending, Dr Evette Doffing.  Jean Rosenthal, MD 10/02/2019

## 2019-10-02 NOTE — Patient Instructions (Signed)
Visit Information I will ask a care guide to investigate if your health plan will cover the cost of a home BP monitor.  Goals Addressed              This Visit's Progress     Patient Stated   .  COMPLETED: "I need some help in the home." (pt-stated)        CARE PLAN ENTRY (see longitudinal plan of care for additional care plan information)  Current Barriers:  . Chronic Disease Management support, education, and care coordination needs related to HTN, HLD, and stroke with neurocognitive deficits - met with patient and his wife [name] and caregiver during clinic visit, wife states she does not live with patient but provides ADL and IADL care from 9a-8p everyday. Patient is alone in his home at night. She is asking for assistance with patient's care in the home. (She fell asleep during CCM RN interview with patient and wife.) Patient states he walks with a cane when outside the house and uses a rolling walker in the house. He says he does not drive. Patient and wife do not think he has ever applied for Medicaid.  . 10/01/19:  Spouse states during SW assessment today that patient's condition as improved and "we don't need help in the home right now"  Clinical Goal(s) related to HTN, HLD, and stroke with neurocognitive deficits:  Over the next 30-90 days, patient will:  . Work with the care management team to address educational, disease management, and care coordination needs  . Begin or continue self health monitoring activities as directed today take medications as directed . Call provider office for new or worsened signs and symptoms Chest pain and Shortness of breath . Call care management team with questions or concerns- provided patient with [place] [employer] spiral bound calendar and business cards of CCM RN and BSW and disease management education for HTN . Contacted [place] to assist patient with Medicaid application and apply for PCS vs referral to  PACE, and complete SDOH screening . Verbalize basic understanding of patient centered plan of care established today  Interventions related to HTN, HLD, and stroke with neurocognitive deficits . Informed patient and spouse/caregiver that Medicare does not cover cost of long term in-home services . Highly recommended that patient/spouse contact [place] to discuss Medicaid eligibility with caseworker due to more services being covered if patient does in fact qualify . Informed patient/spouse that application process can take up to 90 days and encouraged them to contact DSS as soon as possible in case patient or spouse/caregiver are in need of PCS services in the future . Provided them with contact information for [place] . 10/02/19 Completed goal since wife told CCM BSW on 10/01/19 she no longer wants to pursue help in the home for the patient.   . Patient Self Care Activities related to HTN, HLD, and stroke with neurocognitive deficits:  . Patient is unable to independently self-manage chronic health conditions  Please see past updates related to this goal by clicking on the "Past Updates" button in the selected goal      .  "They took me off all my blood pressure medicines but if my health insurance will cover it , I would like a home BP monitor. ' (pt-stated)        [place] (see longitudinal plan of care for additional care plan information)  Current Barriers:  . Care Coordination needs  related to needed DME/Supplies in a patient with HTN, HLD, and PVD and s/p stroke. . Care Coordination needs related to securing a home blood pressure monitor in a patient with HTN, HLD, and PVD and s/p stroke.- spoke with patient to complete follow up assessment, he says he is still receiving home health physical therapy and occupational therapy form [facility], he says he is making slow progress, he reports good medication taking behavior and says his wife helps him with  medication administration, he says he does not take any medication for his blood pressure because they were discontinued when he was in the nursing home  due to hypotension, he says he does not have a home blood pressure monitor but would like one if his health plan covers the cost of the monitor   Nurse Case Manager Clinical Goal(s):  Marland Kitchen Over the next 30-60 days, patient will verbalize understanding of plan to obtain needed DME.  . Over the next 30-60 days, patient will work with care guide and/or  in network DME supplier   to address needs related to DME needs. . Over the next 30-60  days, patient will verbalize understanding of plan for working with CCM team to obtain home blood pressure monitor.   Interventions:  . Inter-disciplinary care team collaboration (see longitudinal plan of care)- referral placed to care guide to assist patient with securing home BP monitor . Collaboration with/confirmation of receipt of DME orders at  in network DME supplier . Collaborated with customer service at [facility] regarding DME benefit coverage  Patient Self Care Activities:  . Patient verbalizes understanding of plan to work with CCM team to investigate securing home blood pressure monitor.  . Unable to self administer medications as prescribed . Unable to perform IADLs independently  Initial goal documentation        The patient verbalized understanding of instructions provided today and declined a print copy of patient instruction materials.   The care management team will reach out to the patient again over the next 30-60 days.   [name] RN, CCM, [facility] RN Care Manager [phone]

## 2019-10-02 NOTE — Chronic Care Management (AMB) (Signed)
Chronic Care Management   Follow Up Note   10/02/2019 Name: Stephen [name] MRN: [id number] DOB: 17-Sep-1950  Referred by: Mitzi Hansen, MD Reason for referral : Chronic Care Management (HTN, HLD, PVD, s/p CVA w/neurodeficits)   Stephen [name] is a 69 y.o. year old male who is a primary care patient of Christian, Lucinda Dell, MD. The CCM team was consulted for assistance with chronic disease management and care coordination needs.    Review of patient status, including review of consultants reports, relevant laboratory and other test results, and collaboration with appropriate care team members and the patient's provider was performed as part of comprehensive patient evaluation and provision of chronic care management services.    SDOH (Social Determinants of Health) assessments performed: No See Care Plan activities for detailed interventions related to [facility])     Outpatient Encounter Medications as of 10/02/2019  Medication Sig  . atorvastatin (LIPITOR) 80 MG tablet TAKE ONE (1) TABLET BY MOUTH EVERY DAY AT SIX IN THE EVENING  . bisacodyl (DULCOLAX) 10 MG suppository Place 10 mg rectally daily as needed for moderate constipation.  . clopidogrel (PLAVIX) 75 MG tablet TAKE 1 TABLET BY MOUTH EVERY DAY  . gabapentin (NEURONTIN) 400 MG capsule Take 1 capsule (400 mg total) by mouth 3 (three) times daily.  . Lidocaine (ASPERCREME LIDOCAINE) 4 % PTCH Apply 1 patch topically daily as needed (Pain).  . magnesium hydroxide (MILK OF MAGNESIA) 400 MG/5ML suspension Take 30 mLs by mouth daily as needed for mild constipation.  . pantoprazole (PROTONIX) 20 MG tablet TAKE ONE (1) TABLET BY MOUTH EVERY DAY  . SODIUM PHOSPHATES RE Place 1 each rectally daily as needed (Constipation).   No facility-administered encounter medications on file as of 10/02/2019.     Objective:   Wt Readings from Last 3 Encounters:  09/23/19 159 lb 4.8 oz (72.3 kg)  09/10/19 153 lb 3.2 oz (69.5 kg)  08/31/19 154 lb 5.1 oz  (70 kg)    Goals Addressed              This Visit's Progress     Patient Stated   .  COMPLETED: "I need some help in the home." (pt-stated)        CARE PLAN ENTRY (see longitudinal plan of care for additional care plan information)  Current Barriers:  . Chronic Disease Management support, education, and care coordination needs related to HTN, HLD, and stroke with neurocognitive deficits - met with patient and his wife [name] and caregiver during clinic visit, wife states she does not live with patient but provides ADL and IADL care from 9a-8p everyday. Patient is alone in his home at night. She is asking for assistance with patient's care in the home. (She fell asleep during CCM RN interview with patient and wife.) Patient states he walks with a cane when outside the house and uses a rolling walker in the house. He says he does not drive. Patient and wife do not think he has ever applied for Medicaid.  . 10/01/19:  Spouse states during SW assessment today that patient's condition as improved and "we don't need help in the home right now"  Clinical Goal(s) related to HTN, HLD, and stroke with neurocognitive deficits:  Over the next 30-90 days, patient will:  . Work with the care management team to address educational, disease management, and care coordination needs  . Begin or continue self health monitoring activities as directed today take medications as directed . Call provider office for  new or worsened signs and symptoms Chest pain and Shortness of breath . Call care management team with questions or concerns- provided patient with [place] [employer] spiral bound calendar and business cards of CCM RN and BSW and disease management education for HTN . Contacted [place] to assist patient with Medicaid application and apply for PCS vs referral to PACE, and complete SDOH screening . Verbalize basic understanding of patient centered plan of care  established today  Interventions related to HTN, HLD, and stroke with neurocognitive deficits . Informed patient and spouse/caregiver that Medicare does not cover cost of long term in-home services . Highly recommended that patient/spouse contact [place] to discuss Medicaid eligibility with caseworker due to more services being covered if patient does in fact qualify . Informed patient/spouse that application process can take up to 90 days and encouraged them to contact DSS as soon as possible in case patient or spouse/caregiver are in need of PCS services in the future . Provided them with contact information for [place] . 10/02/19 Completed goal since wife told CCM BSW on 10/01/19 she no longer wants to pursue help in the home for the patient.   . Patient Self Care Activities related to HTN, HLD, and stroke with neurocognitive deficits:  . Patient is unable to independently self-manage chronic health conditions  Please see past updates related to this goal by clicking on the "Past Updates" button in the selected goal      .  "They took me off all my blood pressure medicines but if my health insurance will cover it , I would like a home BP monitor. ' (pt-stated)        [place] (see longitudinal plan of care for additional care plan information)  Current Barriers:  . Care Coordination needs related to needed DME/Supplies in a patient with HTN, HLD, and PVD and s/p stroke. . Care Coordination needs related to securing a home blood pressure monitor in a patient with HTN, HLD, and PVD and s/p stroke.- spoke with patient to complete follow up assessment, he says he is still receiving home health physical therapy and occupational therapy form [facility], he says he is making slow progress, he reports good medication taking behavior and says his wife helps him with medication administration, he says he does not take any medication for his blood pressure because  they were discontinued when he was in the nursing home  due to hypotension, he says he does not have a home blood pressure monitor but would like one if his health plan covers the cost of the monitor   Nurse Case Manager Clinical Goal(s):  Marland Kitchen Over the next 30-60 days, patient will verbalize understanding of plan to obtain needed DME.  . Over the next 30-60 days, patient will work with care guide and/or  in network DME supplier   to address needs related to DME needs. . Over the next 30-60  days, patient will verbalize understanding of plan for working with CCM team to obtain home blood pressure monitor.   Interventions:  . Inter-disciplinary care team collaboration (see longitudinal plan of care)- referral placed to care guide to assist patient with securing home BP monitor . Collaboration with/confirmation of receipt of DME orders at  in network DME supplier . Collaborated with customer service at [facility] regarding DME benefit coverage  Patient Self Care Activities:  . Patient verbalizes understanding of plan to work with CCM team  to investigate securing home blood pressure monitor.  . Unable to self administer medications as prescribed . Unable to perform IADLs independently  Initial goal documentation         Plan:   The care management team will reach out to the patient again over the next 30-60 days.    [name] RN, CCM, [facility] RN Care Manager [phone]

## 2019-10-02 NOTE — Progress Notes (Addendum)
Internal Medicine Clinic Resident  I have personally reviewed this encounter including the documentation in this note and/or discussed this patient with the care management provider. I will address any urgent items identified by the care management provider and will communicate my actions to the patient's PCP. I have reviewed the patient's CCM visit with my supervising attending, Dr Jimmye Norman.   Jean Rosenthal, MD 10/02/2019  Dorian Pod, MD Internal Medicine

## 2019-10-02 NOTE — Progress Notes (Signed)
Internal Medicine Clinic Attending  CCM services provided by the care management provider and their documentation were discussed with Dr. Eileen Stanford. We reviewed the pertinent findings, urgent action items addressed by the resident and non-urgent items to be addressed by the PCP.  I agree with the assessment, diagnosis, and plan of care documented in the CCM and resident's note.  Axel Filler, MD 10/02/2019

## 2019-10-05 DIAGNOSIS — Z7982 Long term (current) use of aspirin: Secondary | ICD-10-CM | POA: Diagnosis not present

## 2019-10-05 DIAGNOSIS — K219 Gastro-esophageal reflux disease without esophagitis: Secondary | ICD-10-CM | POA: Diagnosis not present

## 2019-10-05 DIAGNOSIS — G629 Polyneuropathy, unspecified: Secondary | ICD-10-CM | POA: Diagnosis not present

## 2019-10-05 DIAGNOSIS — I10 Essential (primary) hypertension: Secondary | ICD-10-CM | POA: Diagnosis not present

## 2019-10-05 DIAGNOSIS — I69351 Hemiplegia and hemiparesis following cerebral infarction affecting right dominant side: Secondary | ICD-10-CM | POA: Diagnosis not present

## 2019-10-05 DIAGNOSIS — I739 Peripheral vascular disease, unspecified: Secondary | ICD-10-CM | POA: Diagnosis not present

## 2019-10-05 DIAGNOSIS — E785 Hyperlipidemia, unspecified: Secondary | ICD-10-CM | POA: Diagnosis not present

## 2019-10-05 DIAGNOSIS — Z9181 History of falling: Secondary | ICD-10-CM | POA: Diagnosis not present

## 2019-10-07 DIAGNOSIS — I739 Peripheral vascular disease, unspecified: Secondary | ICD-10-CM | POA: Diagnosis not present

## 2019-10-07 DIAGNOSIS — I69351 Hemiplegia and hemiparesis following cerebral infarction affecting right dominant side: Secondary | ICD-10-CM | POA: Diagnosis not present

## 2019-10-07 DIAGNOSIS — I10 Essential (primary) hypertension: Secondary | ICD-10-CM | POA: Diagnosis not present

## 2019-10-07 DIAGNOSIS — K219 Gastro-esophageal reflux disease without esophagitis: Secondary | ICD-10-CM | POA: Diagnosis not present

## 2019-10-07 DIAGNOSIS — G629 Polyneuropathy, unspecified: Secondary | ICD-10-CM | POA: Diagnosis not present

## 2019-10-07 DIAGNOSIS — E785 Hyperlipidemia, unspecified: Secondary | ICD-10-CM | POA: Diagnosis not present

## 2019-10-07 DIAGNOSIS — Z7982 Long term (current) use of aspirin: Secondary | ICD-10-CM | POA: Diagnosis not present

## 2019-10-07 DIAGNOSIS — Z9181 History of falling: Secondary | ICD-10-CM | POA: Diagnosis not present

## 2019-10-08 DIAGNOSIS — I10 Essential (primary) hypertension: Secondary | ICD-10-CM | POA: Diagnosis not present

## 2019-10-08 DIAGNOSIS — K219 Gastro-esophageal reflux disease without esophagitis: Secondary | ICD-10-CM | POA: Diagnosis not present

## 2019-10-08 DIAGNOSIS — G629 Polyneuropathy, unspecified: Secondary | ICD-10-CM | POA: Diagnosis not present

## 2019-10-08 DIAGNOSIS — I739 Peripheral vascular disease, unspecified: Secondary | ICD-10-CM | POA: Diagnosis not present

## 2019-10-08 DIAGNOSIS — Z7982 Long term (current) use of aspirin: Secondary | ICD-10-CM | POA: Diagnosis not present

## 2019-10-08 DIAGNOSIS — Z9181 History of falling: Secondary | ICD-10-CM | POA: Diagnosis not present

## 2019-10-08 DIAGNOSIS — I69351 Hemiplegia and hemiparesis following cerebral infarction affecting right dominant side: Secondary | ICD-10-CM | POA: Diagnosis not present

## 2019-10-08 DIAGNOSIS — E785 Hyperlipidemia, unspecified: Secondary | ICD-10-CM | POA: Diagnosis not present

## 2019-10-10 DIAGNOSIS — I639 Cerebral infarction, unspecified: Secondary | ICD-10-CM | POA: Diagnosis not present

## 2019-10-10 DIAGNOSIS — M6281 Muscle weakness (generalized): Secondary | ICD-10-CM | POA: Diagnosis not present

## 2019-10-10 DIAGNOSIS — Z741 Need for assistance with personal care: Secondary | ICD-10-CM | POA: Diagnosis not present

## 2019-10-10 DIAGNOSIS — R2689 Other abnormalities of gait and mobility: Secondary | ICD-10-CM | POA: Diagnosis not present

## 2019-10-12 ENCOUNTER — Telehealth: Payer: Self-pay

## 2019-10-12 DIAGNOSIS — Z7982 Long term (current) use of aspirin: Secondary | ICD-10-CM | POA: Diagnosis not present

## 2019-10-12 DIAGNOSIS — I10 Essential (primary) hypertension: Secondary | ICD-10-CM | POA: Diagnosis not present

## 2019-10-12 DIAGNOSIS — I69351 Hemiplegia and hemiparesis following cerebral infarction affecting right dominant side: Secondary | ICD-10-CM | POA: Diagnosis not present

## 2019-10-12 DIAGNOSIS — Z9181 History of falling: Secondary | ICD-10-CM | POA: Diagnosis not present

## 2019-10-12 DIAGNOSIS — E785 Hyperlipidemia, unspecified: Secondary | ICD-10-CM | POA: Diagnosis not present

## 2019-10-12 DIAGNOSIS — G629 Polyneuropathy, unspecified: Secondary | ICD-10-CM | POA: Diagnosis not present

## 2019-10-12 DIAGNOSIS — I739 Peripheral vascular disease, unspecified: Secondary | ICD-10-CM | POA: Diagnosis not present

## 2019-10-12 DIAGNOSIS — K219 Gastro-esophageal reflux disease without esophagitis: Secondary | ICD-10-CM | POA: Diagnosis not present

## 2019-10-12 NOTE — Telephone Encounter (Signed)
Received TC from [place], PT with [facility] requesting VO to continue PT to work on Editor, commissioning and gait belt. 2 times/wk for 2 weeks VO given Will forward to Dr. Darrick Meigs for agreement or denial. Thank you, Higinio Roger, RN,BSN

## 2019-10-13 NOTE — Telephone Encounter (Signed)
I agree

## 2019-10-14 DIAGNOSIS — I69351 Hemiplegia and hemiparesis following cerebral infarction affecting right dominant side: Secondary | ICD-10-CM | POA: Diagnosis not present

## 2019-10-14 DIAGNOSIS — Z9181 History of falling: Secondary | ICD-10-CM | POA: Diagnosis not present

## 2019-10-14 DIAGNOSIS — Z7982 Long term (current) use of aspirin: Secondary | ICD-10-CM | POA: Diagnosis not present

## 2019-10-14 DIAGNOSIS — I10 Essential (primary) hypertension: Secondary | ICD-10-CM | POA: Diagnosis not present

## 2019-10-14 DIAGNOSIS — K219 Gastro-esophageal reflux disease without esophagitis: Secondary | ICD-10-CM | POA: Diagnosis not present

## 2019-10-14 DIAGNOSIS — G629 Polyneuropathy, unspecified: Secondary | ICD-10-CM | POA: Diagnosis not present

## 2019-10-14 DIAGNOSIS — E785 Hyperlipidemia, unspecified: Secondary | ICD-10-CM | POA: Diagnosis not present

## 2019-10-14 DIAGNOSIS — I739 Peripheral vascular disease, unspecified: Secondary | ICD-10-CM | POA: Diagnosis not present

## 2019-10-15 ENCOUNTER — Ambulatory Visit: Payer: Medicare Other

## 2019-10-15 DIAGNOSIS — E785 Hyperlipidemia, unspecified: Secondary | ICD-10-CM

## 2019-10-15 DIAGNOSIS — Z8673 Personal history of transient ischemic attack (TIA), and cerebral infarction without residual deficits: Secondary | ICD-10-CM

## 2019-10-15 DIAGNOSIS — I10 Essential (primary) hypertension: Secondary | ICD-10-CM

## 2019-10-15 NOTE — Patient Instructions (Signed)
Visit Information  Goals Addressed              This Visit's Progress   .  "I would like assistance with Advance Directives" (pt-stated)        CARE PLAN ENTRY (see longitudinal plan of care for additional care plan information)  Current Barriers:  . Patient and spouse/caregiver do not have Advance Directive documentation   Clinical Social Work Clinical Goal(s):  Marland Kitchen Over the next 60 days, patient will work with SW to address concerns related to completion of Advance Directives  Interventions: . Contacted spouse at scheduled time to review Advance Directive documentation  . Confirmed that patient/spouse received documentation  . Encouraged spouse to thoroughly review documentation with patient prior to next call as she stated today that they have "glanced" at it . Encouraged spouse/patient to be prepared with questions during next scheduled outreach.   Patient Self Care Activities:  Patient is unable to independently self-manage chronic health conditions  Please see past updates related to this goal by clicking on the "Past Updates" button in the selected goal         Patient's spouse/caregiver verbalizes understanding of instructions provided today.   Rescheduled appointment for 10/19/19 at 2:00 PM as patient/spouse have not reviewed documentation and they were not together at time of call.        Ronn Melena, [place] Coordination Social Worker [place] [phone]

## 2019-10-15 NOTE — Chronic Care Management (AMB) (Signed)
  Care Management   Follow Up Note   10/15/2019 Name: Stephen [name] MRN: [id number] DOB: [birth year]  Referred by: Mitzi Hansen, MD Reason for referral : Care Coordination (Advance Directives)   Stephen [name] is a 69 y.o. year old male who is a primary care patient of Christian, Lucinda Dell, MD. The care management team was consulted for assistance with care management and care coordination needs.    Review of patient status, including review of consultants reports, relevant laboratory and other test results, and collaboration with appropriate care team members and the patient's provider was performed as part of comprehensive patient evaluation and provision of chronic care management services.    SDOH (Social Determinants of Health) assessments performed: No See Care Plan activities for detailed interventions related to [facility])     Advanced Directives: See Care Plan and Vynca application for related entries.   Goals Addressed              This Visit's Progress   .  "I would like assistance with Advance Directives" (pt-stated)        CARE PLAN ENTRY (see longitudinal plan of care for additional care plan information)  Current Barriers:  . Patient and spouse/caregiver do not have Advance Directive documentation   Clinical Social Work Clinical Goal(s):  Marland Kitchen Over the next 60 days, patient will work with SW to address concerns related to completion of Advance Directives  Interventions: . Contacted spouse at scheduled time to review Advance Directive documentation  . Confirmed that patient/spouse received documentation  . Encouraged spouse to thoroughly review documentation with patient prior to next call as she stated today that they have "glanced" at it . Encouraged spouse/patient to be prepared with questions during next scheduled outreach.   Patient Self Care Activities:  Patient is unable to independently self-manage chronic health conditions  Please see past updates  related to this goal by clicking on the "Past Updates" button in the selected goal         Rescheduled appointment for 10/19/19 at 2:00 PM as patient/spouse have not reviewed documentation and they were not together at time of call.        Ronn Melena, [place] Coordination Social Worker [place] [phone]

## 2019-10-16 NOTE — Progress Notes (Signed)
Internal Medicine Clinic Resident  I have personally reviewed this encounter including the documentation in this note and/or discussed this patient with the care management provider. I will address any urgent items identified by the care management provider and will communicate my actions to the patient's PCP. I have reviewed the patient's CCM visit with my supervising attending, Dr Hoffman.  Matt Tonya Wantz, MD 10/16/2019   

## 2019-10-19 ENCOUNTER — Ambulatory Visit: Payer: Medicare Other

## 2019-10-19 DIAGNOSIS — Z8673 Personal history of transient ischemic attack (TIA), and cerebral infarction without residual deficits: Secondary | ICD-10-CM

## 2019-10-19 DIAGNOSIS — I10 Essential (primary) hypertension: Secondary | ICD-10-CM

## 2019-10-19 DIAGNOSIS — E785 Hyperlipidemia, unspecified: Secondary | ICD-10-CM

## 2019-10-19 NOTE — Patient Instructions (Signed)
Visit Information  Goals Addressed              This Visit's Progress   .  "I would like assistance with Advance Directives" (Completed)        CARE PLAN ENTRY (see longitudinal plan of care for additional care plan information)  Current Barriers:  . Patient and spouse/caregiver do not have Advance Directive documentation   Clinical Social Work Clinical Goal(s):  Marland Kitchen Over the next 60 days, patient will work with SW to address concerns related to completion of Advance Directives  Interventions: . Talked with patient and spouse today to address questions about documentation and assist with completion . Talked with patient/spouse about the importance of this documentation as they both expressed that they "might" complete it . Offered to schedule office visit in the event they would like to review/complete documents face to face; appointment declined.   . Informed patient/spouse that CCM BSW or Maceo Chaplain can assist with documentation in the future if desired.  .   Patient Self Care Activities:  Patient is unable to independently self-manage chronic health conditions  Please see past updates related to this goal by clicking on the "Past Updates" button in the selected goal         Patient verbalizes understanding of instructions provided today.   The patient has been provided with contact information for the care management team and has been advised to call with any health/community resource related questions or concerns.     Ronn Melena, [place] Coordination Social Worker [place] [phone]

## 2019-10-19 NOTE — Chronic Care Management (AMB) (Signed)
  Care Management   Follow Up Note   10/19/2019 Name: Stephen [name] MRN: [id number] DOB: May 01, 1950  Referred by: Mitzi Hansen, MD Reason for referral : Care Coordination (advance directives)   Jyles Sontag is a 69 y.o. year old male who is a primary care patient of Christian, Lucinda Dell, MD. The care management team was consulted for assistance with care management and care coordination needs.    Review of patient status, including review of consultants reports, relevant laboratory and other test results, and collaboration with appropriate care team members and the patient's provider was performed as part of comprehensive patient evaluation and provision of chronic care management services.    SDOH (Social Determinants of Health) assessments performed: No See Care Plan activities for detailed interventions related to [facility])     Advanced Directives: See Care Plan and Vynca application for related entries.   Goals Addressed              This Visit's Progress   .  "I would like assistance with Advance Directives" (Completed)        CARE PLAN ENTRY (see longitudinal plan of care for additional care plan information)  Current Barriers:  . Patient and spouse/caregiver do not have Advance Directive documentation   Clinical Social Work Clinical Goal(s):  Marland Kitchen Over the next 60 days, patient will work with SW to address concerns related to completion of Advance Directives  Interventions: . Talked with patient and spouse today to address questions about documentation and assist with completion . Talked with patient/spouse about the importance of this documentation as they both expressed that they "might" complete it . Offered to schedule office visit in the event they would like to review/complete documents face to face; appointment declined.   . Informed patient/spouse that CCM BSW or Luana Chaplain can assist with documentation in the future if desired.  .   Patient Self  Care Activities:  Patient is unable to independently self-manage chronic health conditions  Please see past updates related to this goal by clicking on the "Past Updates" button in the selected goal          The patient has been provided with contact information for the care management team and has been advised to call with any health/community resourcerelated questions or concerns.      Ronn Melena, [place] Coordination Social Worker [place] [phone]

## 2019-10-20 DIAGNOSIS — I739 Peripheral vascular disease, unspecified: Secondary | ICD-10-CM | POA: Diagnosis not present

## 2019-10-20 DIAGNOSIS — Z7982 Long term (current) use of aspirin: Secondary | ICD-10-CM | POA: Diagnosis not present

## 2019-10-20 DIAGNOSIS — I10 Essential (primary) hypertension: Secondary | ICD-10-CM | POA: Diagnosis not present

## 2019-10-20 DIAGNOSIS — K219 Gastro-esophageal reflux disease without esophagitis: Secondary | ICD-10-CM | POA: Diagnosis not present

## 2019-10-20 DIAGNOSIS — E785 Hyperlipidemia, unspecified: Secondary | ICD-10-CM | POA: Diagnosis not present

## 2019-10-20 DIAGNOSIS — Z9181 History of falling: Secondary | ICD-10-CM | POA: Diagnosis not present

## 2019-10-20 DIAGNOSIS — G629 Polyneuropathy, unspecified: Secondary | ICD-10-CM | POA: Diagnosis not present

## 2019-10-20 DIAGNOSIS — I69351 Hemiplegia and hemiparesis following cerebral infarction affecting right dominant side: Secondary | ICD-10-CM | POA: Diagnosis not present

## 2019-10-22 ENCOUNTER — Telehealth: Payer: Self-pay

## 2019-10-22 NOTE — Telephone Encounter (Signed)
    MA9/23/2021 1st Attempt  Name: Stephen [name]   MRN: [id number]   DOB: 29-Jul-1950   AGE: 69 y.o.   GENDER: male   PCP Mitzi Hansen, MD.   10/22/19 Spoke with patient to let him know that I was his Care Guide and will be working on his referral for a blood pressure monitor.  Will follow-up with patient in a few days.   Ranika Mcniel, AAS Paralegal, [place] . Embedded Care Coordination [facility]  Care Management  [address], [place] [address] [email or web address]  [phone]   [email or web address]

## 2019-10-23 ENCOUNTER — Telehealth: Payer: Self-pay

## 2019-10-23 ENCOUNTER — Ambulatory Visit: Payer: Medicare Other | Admitting: *Deleted

## 2019-10-23 DIAGNOSIS — G629 Polyneuropathy, unspecified: Secondary | ICD-10-CM | POA: Diagnosis not present

## 2019-10-23 DIAGNOSIS — I10 Essential (primary) hypertension: Secondary | ICD-10-CM | POA: Diagnosis not present

## 2019-10-23 DIAGNOSIS — E785 Hyperlipidemia, unspecified: Secondary | ICD-10-CM

## 2019-10-23 DIAGNOSIS — I739 Peripheral vascular disease, unspecified: Secondary | ICD-10-CM

## 2019-10-23 DIAGNOSIS — K219 Gastro-esophageal reflux disease without esophagitis: Secondary | ICD-10-CM | POA: Diagnosis not present

## 2019-10-23 DIAGNOSIS — Z9181 History of falling: Secondary | ICD-10-CM | POA: Diagnosis not present

## 2019-10-23 DIAGNOSIS — I69351 Hemiplegia and hemiparesis following cerebral infarction affecting right dominant side: Secondary | ICD-10-CM | POA: Diagnosis not present

## 2019-10-23 DIAGNOSIS — Z8673 Personal history of transient ischemic attack (TIA), and cerebral infarction without residual deficits: Secondary | ICD-10-CM

## 2019-10-23 DIAGNOSIS — Z7982 Long term (current) use of aspirin: Secondary | ICD-10-CM | POA: Diagnosis not present

## 2019-10-23 NOTE — Telephone Encounter (Signed)
    MA9/24/2021   Name: Stephen [name]   MRN: [id number]   DOB: 02-01-1950   AGE: 69 y.o.   GENDER: male   PCP Mitzi Hansen, MD.   10/23/19 Unable to leave message  voicemail not set-up.  Will call patient again next week to let him know the blood pressure monitor is covered by [facility] Medicare. [name] will follow-up with the provider about requesting uthorization.     [name], AAS Paralegal, [place] . Embedded Care Coordination [facility] Health  Care Management  [address], [place] [address] [email or web address]  [phone]   [email or web address]

## 2019-10-23 NOTE — Chronic Care Management (AMB) (Signed)
Chronic Care Management   Follow Up Note   10/23/2019 Name: Stephen [name] MRN: [id number] DOB: 04-14-1950  Referred by: Stephen Hansen, MD Reason for referral : Chronic Care Management (HTN, HLD, PVD, s/p CVA with hemiparesis)   [name] is a 69 y.o. year old male who is a primary care patient of Christian, Stephen Dell, MD. The CCM team was consulted for assistance with chronic disease management and care coordination needs.    Review of patient status, including review of consultants reports, relevant laboratory and other test results, and collaboration with appropriate care team members and the patient's provider was performed as part of comprehensive patient evaluation and provision of chronic care management services.    SDOH (Social Determinants of Health) assessments performed: No See Care Plan activities for detailed interventions related to [name])     Outpatient Encounter Medications as of 10/23/2019  Medication Sig   atorvastatin (LIPITOR) 80 MG tablet TAKE ONE (1) TABLET BY MOUTH EVERY DAY AT SIX IN THE EVENING   bisacodyl (DULCOLAX) 10 MG suppository Place 10 mg rectally daily as needed for moderate constipation.   clopidogrel (PLAVIX) 75 MG tablet TAKE 1 TABLET BY MOUTH EVERY DAY   gabapentin (NEURONTIN) 400 MG capsule Take 1 capsule (400 mg total) by mouth 3 (three) times daily.   Lidocaine (ASPERCREME LIDOCAINE) 4 % PTCH Apply 1 patch topically daily as needed (Pain).   magnesium hydroxide (MILK OF MAGNESIA) 400 MG/5ML suspension Take 30 mLs by mouth daily as needed for mild constipation.   pantoprazole (PROTONIX) 20 MG tablet TAKE ONE (1) TABLET BY MOUTH EVERY DAY   SODIUM PHOSPHATES RE Place 1 each rectally daily as needed (Constipation).   No facility-administered encounter medications on file as of 10/23/2019.     Objective:  BP Readings from Last 3 Encounters:  09/23/19 137/66  09/10/19 132/72  08/31/19 (!) 134/77   Wt Readings from Last 3  Encounters:  09/23/19 159 lb 4.8 oz (72.3 kg)  09/10/19 153 lb 3.2 oz (69.5 kg)  08/31/19 154 lb 5.1 oz (70 kg)   Lab Results  Component Value Date   CHOL 124 07/30/2019   HDL 36 (L) 07/30/2019   LDLCALC 74 07/30/2019   TRIG 70 07/30/2019   CHOLHDL 3.4 07/30/2019    Goals Addressed              This Visit's Progress     Patient Stated     "They took me off all my blood pressure medicines but if my health insurance will cover it , I would like a home BP monitor. ' (pt-stated)        [name] (see longitudinal plan of care for additional care plan information)  Current Barriers:   Care Coordination needs related to needed DME/Supplies in a patient with HTN, HLD, and PVD and s/p stroke.  Care Coordination needs related to securing a home blood pressure monitor in a patient with HTN, HLD, and PVD and s/p stroke.- received message from [name] that home blood pressure monitor is covered by patient's health insurance with pre authorization by [name] at [phone].  The contracted medical supply companies are [name] [phone] and [name] [phone].   Nurse Case Manager Clinical Goal(s):   Over the next 30-60 days, patient will verbalize understanding of plan to obtain needed DME.   Over the next 30-60 days, patient will work with care guide and/or  in network DME  supplier   to address needs related to DME needs.  Over the next 30-60  days, patient will verbalize understanding of plan for working with CCM team to obtain home blood pressure monitor.   Interventions:   Inter-disciplinary care team collaboration (see longitudinal plan of care)- messaged provider with request for order for home blood pressure monitor with HTN dx, ICD 10 code, recommended frequency of checks and home reading parameters requiring provider notification  Will Collaborate with/confirmation of receipt of  DME orders at  in network DME supplier  Will collaborate with customer service at [name] to secure home BP monitor  Patient Self Care Activities:   Patient verbalizes understanding of plan to work with CCM team to investigate securing home blood pressure monitor.   Unable to self administer medications as prescribed  Unable to perform IADLs independently  Please see past updates related to this goal by clicking on the "Past Updates" button in the selected goal          Plan:   The care management team will reach out to the patient again over the next 30-60 days.    [name] RN, CCM, [facility] RN Care Manager [phone]

## 2019-10-26 ENCOUNTER — Telehealth: Payer: Self-pay

## 2019-10-26 NOTE — Telephone Encounter (Signed)
    MA9/27/2021 2nd Attempt follow-up call  Name: Stephen [name]   MRN: [id number]   DOB: 07-09-1950   AGE: 69 y.o.   GENDER: male   PCP Mitzi Hansen, MD.   10/26/19 Unable to leave message  voicemail not set-up.  Will call patient again this  week to let him know the blood pressure monitor is covered by [facility] Medicare. [name] will follow-up with the provider about requesting authorization.    Emilya Justen, AAS Paralegal, [place] . Embedded Care Coordination [facility] Health  Care Management  [address], [place] [address] [email or web address]  [phone]   [email or web address]

## 2019-10-27 DIAGNOSIS — Z9181 History of falling: Secondary | ICD-10-CM | POA: Diagnosis not present

## 2019-10-27 DIAGNOSIS — I69351 Hemiplegia and hemiparesis following cerebral infarction affecting right dominant side: Secondary | ICD-10-CM | POA: Diagnosis not present

## 2019-10-27 DIAGNOSIS — I10 Essential (primary) hypertension: Secondary | ICD-10-CM | POA: Diagnosis not present

## 2019-10-27 DIAGNOSIS — Z7982 Long term (current) use of aspirin: Secondary | ICD-10-CM | POA: Diagnosis not present

## 2019-10-27 DIAGNOSIS — K219 Gastro-esophageal reflux disease without esophagitis: Secondary | ICD-10-CM | POA: Diagnosis not present

## 2019-10-27 DIAGNOSIS — I739 Peripheral vascular disease, unspecified: Secondary | ICD-10-CM | POA: Diagnosis not present

## 2019-10-27 DIAGNOSIS — G629 Polyneuropathy, unspecified: Secondary | ICD-10-CM | POA: Diagnosis not present

## 2019-10-27 DIAGNOSIS — E785 Hyperlipidemia, unspecified: Secondary | ICD-10-CM | POA: Diagnosis not present

## 2019-10-28 ENCOUNTER — Other Ambulatory Visit: Payer: Self-pay | Admitting: Internal Medicine

## 2019-10-28 ENCOUNTER — Telehealth: Payer: Self-pay

## 2019-10-28 DIAGNOSIS — K219 Gastro-esophageal reflux disease without esophagitis: Secondary | ICD-10-CM | POA: Diagnosis not present

## 2019-10-28 DIAGNOSIS — Z9181 History of falling: Secondary | ICD-10-CM | POA: Diagnosis not present

## 2019-10-28 DIAGNOSIS — E785 Hyperlipidemia, unspecified: Secondary | ICD-10-CM | POA: Diagnosis not present

## 2019-10-28 DIAGNOSIS — G629 Polyneuropathy, unspecified: Secondary | ICD-10-CM | POA: Diagnosis not present

## 2019-10-28 DIAGNOSIS — I69351 Hemiplegia and hemiparesis following cerebral infarction affecting right dominant side: Secondary | ICD-10-CM | POA: Diagnosis not present

## 2019-10-28 DIAGNOSIS — I10 Essential (primary) hypertension: Secondary | ICD-10-CM | POA: Diagnosis not present

## 2019-10-28 DIAGNOSIS — I739 Peripheral vascular disease, unspecified: Secondary | ICD-10-CM | POA: Diagnosis not present

## 2019-10-28 DIAGNOSIS — Z7982 Long term (current) use of aspirin: Secondary | ICD-10-CM | POA: Diagnosis not present

## 2019-10-28 NOTE — Telephone Encounter (Signed)
° °   MA9/29/2021 3rd Attempt   Name: Stephen [name]    MRN: [id number]    DOB: 08-16-1950    AGE: 69 y.o.    GENDER: male    PCP Mitzi Hansen, MD.   10/28/19 Unable to leave message  voicemail not set-up.  3rd attempt to call patient  to let him know the blood pressure monitor is covered by [facility] Medicare. [name] will follow-up with the provider about requesting authorization. Unable to follow-up with patient after 3 attempts. Closing referral.     Carnie Bruemmer, AAS Paralegal, [place] Management  [address], [place] [address] [email or web address]   [phone]   [email or web address]

## 2019-10-30 ENCOUNTER — Ambulatory Visit: Payer: Medicare Other | Admitting: *Deleted

## 2019-10-30 DIAGNOSIS — Z8673 Personal history of transient ischemic attack (TIA), and cerebral infarction without residual deficits: Secondary | ICD-10-CM

## 2019-10-30 DIAGNOSIS — E785 Hyperlipidemia, unspecified: Secondary | ICD-10-CM

## 2019-10-30 DIAGNOSIS — I10 Essential (primary) hypertension: Secondary | ICD-10-CM

## 2019-10-30 NOTE — Chronic Care Management (AMB) (Signed)
Chronic Care Management   Follow Up Note   10/30/2019 Name: Stephen [name] MRN: [id number] DOB: [birth year]  Referred by: Mitzi Hansen, MD Reason for referral : Chronic Care Management (HTN, HLD, PVD, s/p CVA with hemiparesis)   Stephen [name] is a 69 y.o. year old male who is a primary care patient of Christian, Lucinda Dell, MD. The CCM team was consulted for assistance with chronic disease management and care coordination needs.    Review of patient status, including review of consultants reports, relevant laboratory and other test results, and collaboration with appropriate care team members and the patient's provider was performed as part of comprehensive patient evaluation and provision of chronic care management services.    SDOH (Social Determinants of Health) assessments performed: No See Care Plan activities for detailed interventions related to [facility])     Outpatient Encounter Medications as of 10/30/2019  Medication Sig  . atorvastatin (LIPITOR) 80 MG tablet TAKE ONE (1) TABLET BY MOUTH EVERY DAY AT SIX IN THE EVENING  . clopidogrel (PLAVIX) 75 MG tablet TAKE 1 TABLET BY MOUTH EVERY DAY  . gabapentin (NEURONTIN) 400 MG capsule Take 1 capsule (400 mg total) by mouth 3 (three) times daily.  . bisacodyl (DULCOLAX) 10 MG suppository Place 10 mg rectally daily as needed for moderate constipation. (Patient not taking: Reported on 10/30/2019)  . Lidocaine (ASPERCREME LIDOCAINE) 4 % PTCH Apply 1 patch topically daily as needed (Pain). (Patient not taking: Reported on 10/30/2019)  . magnesium hydroxide (MILK OF MAGNESIA) 400 MG/5ML suspension Take 30 mLs by mouth daily as needed for mild constipation. (Patient not taking: Reported on 10/30/2019)  . pantoprazole (PROTONIX) 20 MG tablet TAKE ONE (1) TABLET BY MOUTH EVERY DAY  . SODIUM PHOSPHATES RE Place 1 each rectally daily as needed (Constipation). (Patient not taking: Reported on 10/30/2019)   No facility-administered encounter  medications on file as of 10/30/2019.     Objective:  BP Readings from Last 3 Encounters:  09/23/19 137/66  09/10/19 132/72  08/31/19 (!) 134/77   Wt Readings from Last 3 Encounters:  09/23/19 159 lb 4.8 oz (72.3 kg)  09/10/19 153 lb 3.2 oz (69.5 kg)  08/31/19 154 lb 5.1 oz (70 kg)   Lab Results  Component Value Date   CHOL 124 07/30/2019   HDL 36 (L) 07/30/2019   LDLCALC 74 07/30/2019   TRIG 70 07/30/2019   CHOLHDL 3.4 07/30/2019    Goals Addressed              This Visit's Progress     Patient Stated   .  "They took me off all my blood pressure medicines but if my health insurance will cover it , I would like a home BP monitor. ' (pt-stated)        [place] (see longitudinal plan of care for additional care plan information)  Current Barriers:  . Care Coordination needs related to needed DME/Supplies in a patient with HTN, HLD, and PVD and s/p stroke. . Care Coordination needs related to securing a home blood pressure monitor in a patient with HTN, HLD, and PVD and s/p stroke.- spoke with patient and complete follow up assessment, patient states he is doing well, reports good medication taking behavior, advised patient that this CCM RN received message from [place] that home blood pressure monitor is covered by patient's health insurance with pre authorization by [place] Medicare at [phone].  The contracted medical supply companies are [employer]  Healthcare [phone] and [facility] [phone].   Nurse Case Manager Clinical Goal(s):  Marland Kitchen Over the next 30-60 days, patient will verbalize understanding of plan to obtain needed DME.  . Over the next 30-60 days, patient will work with care guide and/or  in network DME supplier   to address needs related to DME needs. . Over the next 30-60  days, patient will verbalize understanding of plan for working with CCM team to obtain home blood pressure monitor.    Interventions:  . Inter-disciplinary care team collaboration (see longitudinal plan of care)- messaged provider with request for order for home blood pressure monitor with HTN dx, ICD 10 code, recommended frequency of checks and home reading parameters requiring provider notification. 10/28/19- order for home BP monitor written by Dr. Darrick Meigs . Will collaborate with customer service at [facility] Medicare to secure home BP monitor- this CCM RN was told the DME number for the home BP monitor is needed before prior authorization can be investigated  . Will Collaborate with/confirmation of receipt of DME orders at  in network DME supplier - spoke with Butch Penny at [name] and placed order for home blood pressure monitor, she will contact this CCM RN if the DME item is not covered, if it is covered the monitor will be shipped directly to the patient's home.  . Advised patient of above information  Patient Self Care Activities:  . Patient verbalizes understanding of plan to work with CCM team to investigate securing home blood pressure monitor.  . Unable to self administer medications as prescribed . Unable to perform IADLs independently  Please see past updates related to this goal by clicking on the "Past Updates" button in the selected goal          Plan:   The care management team will reach out to the patient again over the next 30-60 days.    [name] RN, CCM, [facility] RN Care Manager [phone]

## 2019-10-30 NOTE — Progress Notes (Signed)
Internal Medicine Clinic Resident  I have personally reviewed this encounter including the documentation in this note and/or discussed this patient with the care management provider. I will address any urgent items identified by the care management provider and will communicate my actions to the patient's PCP. I have reviewed the patient's CCM visit with my supervising attending, Dr Narendra.  Severa Jeremiah, MD  IMTS PGY-2 10/30/2019    

## 2019-10-30 NOTE — Patient Instructions (Signed)
Visit Information It was nice speaking with you today. I have placed the order for the home BP monitor with the supplier. If the monitor is covered by your insurance plan it will be shipped directly to your home.  Please call me if you have questions about how to use the monitor when you receive it.  Goals Addressed              This Visit's Progress     Patient Stated   .  "They took me off all my blood pressure medicines but if my health insurance will cover it , I would like a home BP monitor. ' (pt-stated)        [place] (see longitudinal plan of care for additional care plan information)  Current Barriers:  . Care Coordination needs related to needed DME/Supplies in a patient with HTN, HLD, and PVD and s/p stroke. . Care Coordination needs related to securing a home blood pressure monitor in a patient with HTN, HLD, and PVD and s/p stroke.- spoke with patient and complete follow up assessment, patient states he is doing well, reports good medication taking behavior, advised patient that this CCM RN received message from [place] that home blood pressure monitor is covered by patient's health insurance with pre authorization by [place] Medicare at [phone].  The contracted medical supply companies are [name] [phone] and [facility] [phone].   Nurse Case Manager Clinical Goal(s):  Marland Kitchen Over the next 30-60 days, patient will verbalize understanding of plan to obtain needed DME.  . Over the next 30-60 days, patient will work with care guide and/or  in network DME supplier   to address needs related to DME needs. . Over the next 30-60  days, patient will verbalize understanding of plan for working with CCM team to obtain home blood pressure monitor.   Interventions:  . Inter-disciplinary care team collaboration (see longitudinal plan of care)- messaged provider with request for order for home blood  pressure monitor with HTN dx, ICD 10 code, recommended frequency of checks and home reading parameters requiring provider notification. 10/28/19- order for home BP monitor written by Dr. Darrick Meigs . Will collaborate with customer service at [facility] Medicare to secure home BP monitor- this CCM RN was told the DME number for the home BP monitor is needed before prior authorization can be investigated  . Will Collaborate with/confirmation of receipt of DME orders at  in network DME supplier - spoke with Butch Penny at [name] and placed order for home blood pressure monitor, she will contact this CCM RN if the DME item is not covered, if it is covered the monitor will be shipped directly to the patient's home.  . Advised patient of above information  Patient Self Care Activities:  . Patient verbalizes understanding of plan to work with CCM team to investigate securing home blood pressure monitor.  . Unable to self administer medications as prescribed . Unable to perform IADLs independently  Please see past updates related to this goal by clicking on the "Past Updates" button in the selected goal         The patient verbalized understanding of instructions provided today and declined a print copy of patient instruction materials.   The care management team will reach out to the patient again over the next 30-60 days.   [name] RN, CCM, [facility] RN Care Manager [phone]

## 2019-11-05 ENCOUNTER — Ambulatory Visit: Payer: Medicare Other | Admitting: *Deleted

## 2019-11-05 DIAGNOSIS — E785 Hyperlipidemia, unspecified: Secondary | ICD-10-CM

## 2019-11-05 DIAGNOSIS — I10 Essential (primary) hypertension: Secondary | ICD-10-CM

## 2019-11-05 DIAGNOSIS — Z8673 Personal history of transient ischemic attack (TIA), and cerebral infarction without residual deficits: Secondary | ICD-10-CM

## 2019-11-05 DIAGNOSIS — I739 Peripheral vascular disease, unspecified: Secondary | ICD-10-CM

## 2019-11-05 NOTE — Chronic Care Management (AMB) (Signed)
Chronic Care Management   Follow Up Note   11/05/2019 Name: Stephen [name] MRN: [id number] DOB: July 28, 1950  Referred by: Mitzi Hansen, MD Reason for referral : Chronic Care Management (HTN, HLD, PVD, s/p CVA with hemiparesis)   Stephen [name] is a 69 y.o. year old male who is a primary care patient of Christian, Lucinda Dell, MD. The CCM team was consulted for assistance with chronic disease management and care coordination needs.    Review of patient status, including review of consultants reports, relevant laboratory and other test results, and collaboration with appropriate care team members and the patient's provider was performed as part of comprehensive patient evaluation and provision of chronic care management services.    SDOH (Social Determinants of Health) assessments performed: No See Care Plan activities for detailed interventions related to [facility])     Outpatient Encounter Medications as of 11/05/2019  Medication Sig  . atorvastatin (LIPITOR) 80 MG tablet TAKE ONE (1) TABLET BY MOUTH EVERY DAY AT SIX IN THE EVENING  . bisacodyl (DULCOLAX) 10 MG suppository Place 10 mg rectally daily as needed for moderate constipation. (Patient not taking: Reported on 10/30/2019)  . clopidogrel (PLAVIX) 75 MG tablet TAKE 1 TABLET BY MOUTH EVERY DAY  . gabapentin (NEURONTIN) 400 MG capsule Take 1 capsule (400 mg total) by mouth 3 (three) times daily.  . Lidocaine (ASPERCREME LIDOCAINE) 4 % PTCH Apply 1 patch topically daily as needed (Pain). (Patient not taking: Reported on 10/30/2019)  . magnesium hydroxide (MILK OF MAGNESIA) 400 MG/5ML suspension Take 30 mLs by mouth daily as needed for mild constipation. (Patient not taking: Reported on 10/30/2019)  . pantoprazole (PROTONIX) 20 MG tablet TAKE ONE (1) TABLET BY MOUTH EVERY DAY  . SODIUM PHOSPHATES RE Place 1 each rectally daily as needed (Constipation). (Patient not taking: Reported on 10/30/2019)   No facility-administered encounter  medications on file as of 11/05/2019.     Objective:  BP Readings from Last 3 Encounters:  09/23/19 137/66  09/10/19 132/72  08/31/19 (!) 134/77   Wt Readings from Last 3 Encounters:  09/23/19 159 lb 4.8 oz (72.3 kg)  09/10/19 153 lb 3.2 oz (69.5 kg)  08/31/19 154 lb 5.1 oz (70 kg)   Lab Results  Component Value Date   CHOL 124 07/30/2019   HDL 36 (L) 07/30/2019   LDLCALC 74 07/30/2019   TRIG 70 07/30/2019   CHOLHDL 3.4 07/30/2019    Goals Addressed              This Visit's Progress     Patient Stated   .  "They took me off all my blood pressure medicines but if my health insurance will cover it , I would like a home BP monitor. ' (pt-stated)        [place] (see longitudinal plan of care for additional care plan information)  Current Barriers:  . Care Coordination needs related to needed DME/Supplies in a patient with HTN, HLD, and PVD and s/p stroke. . Care Coordination needs related to securing a home blood pressure monitor in a patient with HTN, HLD, and PVD and s/p stroke.- received call from Butch Penny at [facility] [phone] stating home blood pressure monitor is not covered by patient's health insurance plan  Nurse Case Manager Clinical Goal(s):  Marland Kitchen Over the next 30-60 days, patient will verbalize understanding of plan to obtain needed DME.  . Over the next 30-60 days, patient will work with care guide and/or  in network DME supplier  to address needs related to DME needs. . Over the next 30-60  days, patient will verbalize understanding of plan for working with CCM team to obtain home blood pressure monitor.   Interventions:  . Inter-disciplinary care team collaboration (see longitudinal plan of care)-  . Will advise patient that home blood pressure monitor is not covered by his health plan and discuss other options for purchasing used monitor during next month's follow up call  Patient Self Care Activities:  . Patient verbalizes understanding  of plan to work with CCM team to investigate securing home blood pressure monitor.  . Unable to self administer medications as prescribed . Unable to perform IADLs independently  Please see past updates related to this goal by clicking on the "Past Updates" button in the selected goal          Plan:   The care management team will reach out to the patient again over the next 30-60 days.    [name] RN, CCM, [facility] RN Care Manager [phone]

## 2019-11-05 NOTE — Progress Notes (Signed)
Internal Medicine Clinic Resident  I have personally reviewed this encounter including the documentation in this note and/or discussed this patient with the care management provider. I will address any urgent items identified by the care management provider and will communicate my actions to the patient's PCP. I have reviewed the patient's CCM visit with my supervising attending, Dr Evette Doffing.  Mosetta Anis, MD 11/05/2019

## 2019-11-06 NOTE — Progress Notes (Signed)
Internal Medicine Clinic Attending  CCM services provided by the care management provider and their documentation were discussed with Dr. Aslam. We reviewed the pertinent findings, urgent action items addressed by the resident and non-urgent items to be addressed by the PCP.  I agree with the assessment, diagnosis, and plan of care documented in the CCM and resident's note.  Nhu Glasby, MD 11/06/2019  

## 2019-11-09 DIAGNOSIS — I639 Cerebral infarction, unspecified: Secondary | ICD-10-CM | POA: Diagnosis not present

## 2019-11-09 DIAGNOSIS — R2689 Other abnormalities of gait and mobility: Secondary | ICD-10-CM | POA: Diagnosis not present

## 2019-11-09 DIAGNOSIS — M6281 Muscle weakness (generalized): Secondary | ICD-10-CM | POA: Diagnosis not present

## 2019-11-09 DIAGNOSIS — Z741 Need for assistance with personal care: Secondary | ICD-10-CM | POA: Diagnosis not present

## 2019-11-10 ENCOUNTER — Other Ambulatory Visit: Payer: Self-pay | Admitting: Adult Health

## 2019-11-10 DIAGNOSIS — K219 Gastro-esophageal reflux disease without esophagitis: Secondary | ICD-10-CM

## 2019-12-01 ENCOUNTER — Telehealth: Payer: Self-pay | Admitting: *Deleted

## 2019-12-01 ENCOUNTER — Telehealth: Payer: Medicare Other

## 2019-12-01 NOTE — Telephone Encounter (Signed)
  Chronic Care Management   Outreach Note  12/01/2019 Name: Stephen [name] MRN: [id number] DOB: 1950/08/31  Referred by: Mitzi Hansen, MD Reason for referral : Chronic Care Management (HTN, HLD, PVD, s/p CVA with hemiparesis)   An unsuccessful telephone outreach was attempted today. Unable to leave message as recording states voice mail box has not been set up yet.The patient was referred to the case management team for assistance with care management and care coordination.   Follow Up Plan: The care management team will reach out to the patient again over the next 7-10 days.   [name] RN, CCM, [facility] RN Care Manager [phone]

## 2019-12-02 ENCOUNTER — Other Ambulatory Visit: Payer: Self-pay | Admitting: Adult Health

## 2019-12-02 DIAGNOSIS — G629 Polyneuropathy, unspecified: Secondary | ICD-10-CM

## 2019-12-09 ENCOUNTER — Ambulatory Visit: Payer: Medicare Other | Admitting: *Deleted

## 2019-12-09 ENCOUNTER — Other Ambulatory Visit: Payer: Self-pay | Admitting: Adult Health

## 2019-12-09 DIAGNOSIS — Z8673 Personal history of transient ischemic attack (TIA), and cerebral infarction without residual deficits: Secondary | ICD-10-CM

## 2019-12-09 DIAGNOSIS — E785 Hyperlipidemia, unspecified: Secondary | ICD-10-CM

## 2019-12-09 DIAGNOSIS — K219 Gastro-esophageal reflux disease without esophagitis: Secondary | ICD-10-CM

## 2019-12-09 DIAGNOSIS — I739 Peripheral vascular disease, unspecified: Secondary | ICD-10-CM

## 2019-12-09 DIAGNOSIS — I10 Essential (primary) hypertension: Secondary | ICD-10-CM

## 2019-12-09 NOTE — Chronic Care Management (AMB) (Signed)
Chronic Care Management   Follow Up Note   12/09/2019 Name: Stephen [name] MRN: [id number] DOB: 03/17/1950  Referred by: Mitzi Hansen, MD Reason for referral : Chronic Care Management (HTN, HLD, PVD, s/p CVA with hemiparesis)   Stephen [name] is a 69 y.o. year old male who is a primary care patient of Christian, Lucinda Dell, MD. The CCM team was consulted for assistance with chronic disease management and care coordination needs.    Review of patient status, including review of consultants reports, relevant laboratory and other test results, and collaboration with appropriate care team members and the patient's provider was performed as part of comprehensive patient evaluation and provision of chronic care management services.    SDOH (Social Determinants of Health) assessments performed: No See Care Plan activities for detailed interventions related to [facility])     Outpatient Encounter Medications as of 12/09/2019  Medication Sig  . atorvastatin (LIPITOR) 80 MG tablet TAKE ONE (1) TABLET BY MOUTH EVERY DAY AT SIX IN THE EVENING  . bisacodyl (DULCOLAX) 10 MG suppository Place 10 mg rectally daily as needed for moderate constipation. (Patient not taking: Reported on 10/30/2019)  . clopidogrel (PLAVIX) 75 MG tablet TAKE 1 TABLET BY MOUTH EVERY DAY  . gabapentin (NEURONTIN) 400 MG capsule Take 1 capsule (400 mg total) by mouth 3 (three) times daily.  . Lidocaine (ASPERCREME LIDOCAINE) 4 % PTCH Apply 1 patch topically daily as needed (Pain). (Patient not taking: Reported on 10/30/2019)  . magnesium hydroxide (MILK OF MAGNESIA) 400 MG/5ML suspension Take 30 mLs by mouth daily as needed for mild constipation. (Patient not taking: Reported on 10/30/2019)  . pantoprazole (PROTONIX) 20 MG tablet TAKE ONE (1) TABLET BY MOUTH EVERY DAY  . SODIUM PHOSPHATES RE Place 1 each rectally daily as needed (Constipation). (Patient not taking: Reported on 10/30/2019)   No facility-administered encounter  medications on file as of 12/09/2019.     Objective:  BP Readings from Last 3 Encounters:  09/23/19 137/66  09/10/19 132/72  08/31/19 (!) 134/77   Lab Results  Component Value Date   CHOL 124 07/30/2019   HDL 36 (L) 07/30/2019   LDLCALC 74 07/30/2019   TRIG 70 07/30/2019   CHOLHDL 3.4 07/30/2019    Wt Readings from Last 3 Encounters:  09/23/19 159 lb 4.8 oz (72.3 kg)  09/10/19 153 lb 3.2 oz (69.5 kg)  08/31/19 154 lb 5.1 oz (70 kg)    Goals Addressed              This Visit's Progress     Patient Stated   .  " I haven't applied for Medicaid' (pt-stated)        CARE PLAN ENTRY (see longitudinal plan of care for additional care plan information)  Current Barriers:  Marland Kitchen Knowledge Deficits related to Medicaid application process  Nurse Case Manager Clinical Goal(s):  Marland Kitchen Over the next 30-60 days, patient will verbalize understanding of plan for CCM RN to mail Medicaid application information to assist him with applying for Medicaid  Interventions:  . Inter-disciplinary care team collaboration (see longitudinal plan of care) . Mailed Medicaid application and [place] phone number to patient home address . Advised patient to review Medicaid application with his wife assisting him in gathering the documentation needed then to call the [place] (number was included in information mailed to patient) to apply . Discussed benefits of Medicaid coverage including covering Medicare coinsurance and [place] should  patient benefit from these services in the future  Patient Self Care Activities:  . Patient verbalizes understanding of plan to apply for Medicaid . Unable to independently apply for Medicaid  Initial goal documentation     .  "They took me off all my blood pressure medicines but if my health insurance will cover it , I would like a home BP monitor. ' (pt-stated)        [place] (see longitudinal plan of care for additional care plan information)  Current Barriers:  . Care Coordination needs related to needed DME/Supplies in a patient with HTN, HLD, and PVD and s/p stroke. . Care Coordination needs related to securing a home blood pressure monitor in a patient with HTN, HLD, and PVD and s/p stroke.- spoke with patient to complete follow up assessment  Nurse Case Manager Clinical Goal(s):  Marland Kitchen Over the next 30-60 days, patient will verbalize understanding of plan to obtain needed DME.  . Over the next 30-60 days, patient will work with care guide and/or  in network DME supplier   to address needs related to DME needs. . Over the next 30-60  days, patient will verbalize understanding of plan for working with CCM team to obtain home blood pressure monitor.   Interventions:  . Inter-disciplinary care team collaboration (see longitudinal plan of care) . Assessed patient's self management skills in regards to HTN, HLD ,PVD . Evaluation of current treatment plan related to HTN, HLD and PVD  and patient's adherence to plan as established by provider. . Reviewed medications with patient and assessed medication taking behavior;  if indicated, discussed importance of medication adherence  . Provided education to patient about basic HTN, HLD and PVD self management strategies . Discussed plans with patient for ongoing care management follow up and ensured patient has contact number for CCM team and clinic . Advised patient that home blood pressure monitor is not covered by his health plan and discussed other options for purchasing used monitor or checking his blood pressure when he has the opportunity when he is in a pharmacy with a BP check station  Patient Self Care Activities:  . Patient verbalizes understanding of plan to work with CCM team to investigate securing home blood pressure monitor.  . Unable to self administer medications as prescribed . Unable to perform  IADLs independently  Please see past updates related to this goal by clicking on the "Past Updates" button in the selected goal          Plan:   The care management team will reach out to the patient again over the next 30-60 days.    [name] RN, CCM, [facility] RN Care Manager [phone]

## 2019-12-09 NOTE — Patient Instructions (Signed)
Visit Information It was nice speaking with you today. I will mail you a Medicaid application. Please ask your wife to help you complete it and then call [place] to apply.  Goals Addressed              This Visit's Progress     Patient Stated   .  " I haven't applied for Medicaid' (pt-stated)        CARE PLAN ENTRY (see longitudinal plan of care for additional care plan information)  Current Barriers:  Marland Kitchen Knowledge Deficits related to Medicaid application process  Nurse Case Manager Clinical Goal(s):  Marland Kitchen Over the next 30-60 days, patient will verbalize understanding of plan for CCM RN to mail Medicaid application information to assist him with applying for Medicaid  Interventions:  . Inter-disciplinary care team collaboration (see longitudinal plan of care) . Mailed Medicaid application and [place] phone number to patient home address . Advised patient to review Medicaid application with his wife assisting him in gathering the documentation needed then to call the [place] (number was included in information mailed to patient) to apply . Discussed benefits of Medicaid coverage including covering Medicare coinsurance and [place] should patient benefit from these services in the future  Patient Self Care Activities:  . Patient verbalizes understanding of plan to apply for Medicaid . Unable to independently apply for Medicaid  Initial goal documentation     .  "They took me off all my blood pressure medicines but if my health insurance will cover it , I would like a home BP monitor. ' (pt-stated)        [place] (see longitudinal plan of care for additional care plan information)  Current Barriers:  . Care Coordination needs related to needed DME/Supplies in a patient with HTN, HLD, and PVD and s/p stroke. . Care Coordination needs related to  securing a home blood pressure monitor in a patient with HTN, HLD, and PVD and s/p stroke.- spoke with patient to complete follow up assessment  Nurse Case Manager Clinical Goal(s):  Marland Kitchen Over the next 30-60 days, patient will verbalize understanding of plan to obtain needed DME.  . Over the next 30-60 days, patient will work with care guide and/or  in network DME supplier   to address needs related to DME needs. . Over the next 30-60  days, patient will verbalize understanding of plan for working with CCM team to obtain home blood pressure monitor.   Interventions:  . Inter-disciplinary care team collaboration (see longitudinal plan of care) . Assessed patient's self management skills in regards to HTN, HLD ,PVD . Evaluation of current treatment plan related to HTN, HLD and PVD  and patient's adherence to plan as established by provider. . Reviewed medications with patient and assessed medication taking behavior;  if indicated, discussed importance of medication adherence  . Provided education to patient about basic HTN, HLD and PVD self management strategies . Discussed plans with patient for ongoing care management follow up and ensured patient has contact number for CCM team and clinic . Advised patient that home blood pressure monitor is not covered by his health plan and discuss other options for purchasing used monitor or checking his blood pressure when he has the opportunity when he is in a pharmacy with a BP check station  Patient Self Care Activities:  . Patient verbalizes understanding of plan to work with  CCM team to investigate securing home blood pressure monitor.  . Unable to self administer medications as prescribed . Unable to perform IADLs independently  Please see past updates related to this goal by clicking on the "Past Updates" button in the selected goal         The patient verbalized understanding of instructions provided today and declined a print copy of patient  instruction materials.   The care management team will reach out to the patient again over the next 30-60 days.   [name] RN, CCM, [facility] RN Care Manager [phone]

## 2019-12-09 NOTE — Progress Notes (Signed)
Internal Medicine Clinic Resident  I have personally reviewed this encounter including the documentation in this note and/or discussed this patient with the care management provider. I will address any urgent items identified by the care management provider and will communicate my actions to the patient's PCP. I have reviewed the patient's CCM visit with my supervising attending, Dr Jimmye Norman.  Andrew Au, MD 12/09/2019

## 2019-12-10 NOTE — Progress Notes (Signed)
Attending attestation: This CCM encounter was reviewed and discussed with Dr. Bridgett Larsson and I agree with documentation. Dorian Pod, MD

## 2020-01-02 ENCOUNTER — Other Ambulatory Visit: Payer: Self-pay | Admitting: Adult Health

## 2020-01-06 ENCOUNTER — Encounter: Payer: Self-pay | Admitting: *Deleted

## 2020-01-06 NOTE — Progress Notes (Unsigned)
Things That May Be Affecting Your Health:  Alcohol  Hearing loss  Pain   x Depression x Home Safety  Sexual Health   Diabetes  Lack of physical activity  Stress  x Difficulty with daily activities x Loneliness  Tiredness   Drug use x Medicines  Tobacco use  x Falls  Motor Vehicle Safety  Weight  x Food choices  Oral Health  Other    YOUR PERSONALIZED HEALTH PLAN : 1. Schedule your next subsequent Medicare Wellness visit in one year 2. Attend all of your regular appointments to address your medical issues 3. Complete the preventative screenings and services   Annual Wellness Visit   Medicare Covered Preventative Screenings and [place] Men and Women Who How Often Need? Date of Last Service Action  Abdominal Aortic Aneurysm Adults with AAA risk factors Once x    Alcohol Misuse and Counseling All Adults Screening once a year if no alcohol misuse. Counseling up to 4 face to face sessions.     Bone Density Measurement  Adults at risk for osteoporosis Once every 2 yrs     Lipid Panel Z13.6 All adults without CV disease Once every 5 yrs     Colorectal Cancer   Stool sample or  Colonoscopy All adults 74 and older   Once every year  Every 10 years     Depression All Adults Once a year x Today   Diabetes Screening Blood glucose, post glucose load, or GTT Z13.1  All adults at risk  Pre-diabetics  Once per year  Twice per year     Diabetes  Self-Management Training All adults Diabetics 10 hrs first year; 2 hours subsequent years. Requires Copay     Glaucoma  Diabetics  Family history of glaucoma  African Americans 52 yrs +  Hispanic Americans 72 yrs + Annually - requires coppay     Hepatitis C Z72.89 or F19.20  High Risk for HCV  Born between 1945 and 1965  Annually  Once     HIV Z11.4 All adults based on risk  Annually btw ages 5 & 44 regardless of risk  Annually > 65 yrs if at increased risk     Lung Cancer Screening Asymptomatic adults  aged 49-77 with 52 pack yr history and current smoker OR quit within the last 15 yrs Annually Must have counseling and shared decision making documentation before first screen X- need follow up for a lung nodule--I can order if he is agreeable    Medical Nutrition Therapy Adults with   Diabetes  Renal disease  Kidney transplant within past 3 yrs 3 hours first year; 2 hours subsequent years x    Obesity and Counseling All adults Screening once a year Counseling if BMI 30 or higher  Today   Tobacco Use Counseling Adults who use tobacco  Up to 8 visits in one year     Vaccines Z23  Hepatitis B  Influenza   Pneumonia  Adults   Once  Once every flu season  Two different vaccines separated by one year x    Next Annual Wellness Visit People with Medicare Every year x Today     Services & Screenings Women Who How Often Need  Date of Last Service Action  Mammogram  Z12.31 Women over 19 One baseline ages 65-39. Annually ager 40 yrs+     Pap tests All women Annually if high risk. Every 2 yrs for normal risk women     Screening  for cervical cancer with   Pap (Z01.419 nl or Z01.411abnl) &  HPV Z11.51 Women aged 4 to 19 Once every 5 yrs     Screening pelvic and breast exams All women Annually if high risk. Every 2 yrs for normal risk women     Sexually Transmitted Diseases  Chlamydia  Gonorrhea  Syphilis All at risk adults Annually for non pregnant females at increased risk         [place] Men Who How Ofter Need  Date of Last Service Action  Prostate Cancer - DRE & PSA Men over 50 Annually.  DRE might require a copay.     Sexually Transmitted Diseases  Syphilis All at risk adults Annually for men at increased risk

## 2020-01-06 NOTE — Progress Notes (Signed)
COMPLETE

## 2020-01-06 NOTE — Progress Notes (Unsigned)

## 2020-01-08 ENCOUNTER — Telehealth: Payer: Medicare Other

## 2020-01-08 ENCOUNTER — Telehealth: Payer: Self-pay | Admitting: *Deleted

## 2020-01-08 NOTE — Telephone Encounter (Signed)
  Chronic Care Management   Outreach Note  01/08/2020 Name: Stephen [name] MRN: [id number] DOB: 1950/07/29  Referred by: Mitzi Hansen, MD Reason for referral : Chronic Care Management (HTN, HLD, PVD, s/p CVA with hemiparesis)   Vertis Scheib is enrolled in a Managed Medicaid Health Plan: No  An unsuccessful telephone outreach was attempted today. The patient was referred to the case management team for assistance with care management and care coordination.   Follow Up Plan:Unable to leave message on patient's contact number as message states voice mailbox has not been set up yet.  The care management team will reach out to the patient again over the next 7-14 days.   [name] RN, CCM, [facility] RN Care Manager [phone]

## 2020-01-14 ENCOUNTER — Telehealth: Payer: Medicare Other

## 2020-01-14 ENCOUNTER — Telehealth: Payer: Self-pay | Admitting: *Deleted

## 2020-01-14 NOTE — Telephone Encounter (Signed)
°  Chronic Care Management   Outreach Note  01/14/2020 Name: Stephen [name] MRN: [id number] DOB: 08/08/1950  Referred by: Mitzi Hansen, MD Reason for referral : Chronic Care Management (HTN, HLD, PVD, s/p CVA with left hemiparesis)   Stephen [name] is enrolled in a Managed Medicaid Health Plan: No  A second unsuccessful telephone outreach was attempted today. The patient was referred to the case management team for assistance with care management and care coordination.   Follow Up Plan: Unable to leave message on patient's contact number as message states voice mailbox has not been set up yet.  The care management team will reach out to the patient again over the next 7-14 days  [name] RN, CCM, [facility] RN Care Manager [phone]

## 2020-01-21 ENCOUNTER — Telehealth: Payer: Medicare Other

## 2020-01-27 ENCOUNTER — Ambulatory Visit: Payer: Medicare Other | Admitting: *Deleted

## 2020-01-27 DIAGNOSIS — Z8673 Personal history of transient ischemic attack (TIA), and cerebral infarction without residual deficits: Secondary | ICD-10-CM

## 2020-01-27 DIAGNOSIS — I10 Essential (primary) hypertension: Secondary | ICD-10-CM

## 2020-01-27 DIAGNOSIS — E785 Hyperlipidemia, unspecified: Secondary | ICD-10-CM

## 2020-01-27 DIAGNOSIS — I739 Peripheral vascular disease, unspecified: Secondary | ICD-10-CM

## 2020-01-27 NOTE — Chronic Care Management (AMB) (Signed)
  Chronic Care Management   Outreach Note  01/27/2020 Name: Stephen [name] MRN: [id number] DOB: [birth year]  Referred by: Elige Radon, MD Reason for referral : Chronic Care Management (HTN, HLD, PVD, s/p CVA with left hemiparesis)   Layden Caterino is enrolled in a Managed Medicaid Health Plan: No  Third unsuccessful telephone outreach was attempted today. The patient was referred to the case management team for assistance with care management and care coordination. The patient's primary care provider has been notified of our unsuccessful attempts to make or maintain contact with the patient. The care management team is pleased to engage with this patient at any time in the future should he/she be interested in assistance from the care management team.   Follow Up Plan: No further follow up required: closing referral as unable to maintain contact with patient.  Cranford Mon RN, CCM, [facility] RN Care Manager [phone]

## 2020-02-01 NOTE — Progress Notes (Signed)
Internal Medicine Clinic Resident  I have personally reviewed this encounter including the documentation in this note and/or discussed this patient with the care management provider. I will address any urgent items identified by the care management provider and will communicate my actions to the patient's PCP. I have reviewed the patient's CCM visit with my supervising attending, Dr Antony Contras.  Quincy Simmonds, MD 02/01/2020

## 2020-02-01 NOTE — Progress Notes (Signed)
Internal Medicine Clinic Attending  CCM services provided by the care management provider and their documentation were discussed with Dr. Elaina Pattee. We reviewed the pertinent findings, urgent action items addressed by the resident and non-urgent items to be addressed by the PCP.  I agree with the assessment, diagnosis, and plan of care documented in the CCM and resident's note.  Reymundo Poll, MD 02/01/2020

## 2020-02-08 ENCOUNTER — Other Ambulatory Visit: Payer: Self-pay | Admitting: Adult Health

## 2020-02-08 DIAGNOSIS — G629 Polyneuropathy, unspecified: Secondary | ICD-10-CM

## 2020-03-29 ENCOUNTER — Other Ambulatory Visit: Payer: Self-pay | Admitting: Adult Health

## 2020-06-01 IMAGING — MR MR LUMBAR SPINE W/O CM
4 of 5 series · 26 of 48 positions shown · non-contrast
Comparison: Lumbar spine MRI 06/27/2004

CLINICAL DATA: Left flank and low back pain

EXAM:
MRI LUMBAR SPINE WITHOUT CONTRAST
TECHNIQUE: Multiplanar, multisequence MR imaging of the lumbar spine was
performed. No intravenous contrast was administered.

[Series 5: T2 · sagittal · 4.0mm · 0.73mm/px · 6 of 16 slices shown (1 of 2)]
[im 1/16]
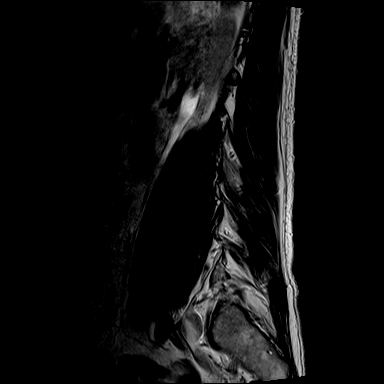
[im 4/16]
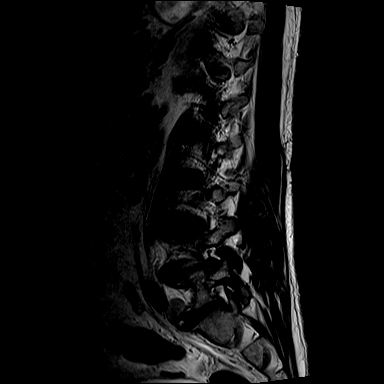
[im 7/16]
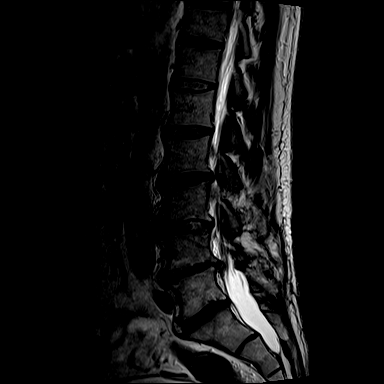
[im 10/16]
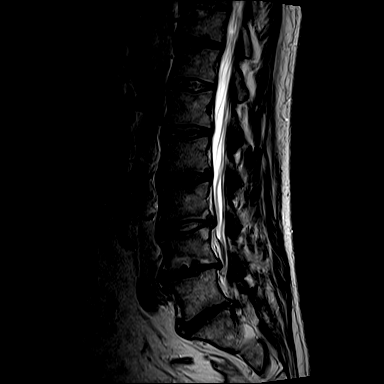
[im 13/16]
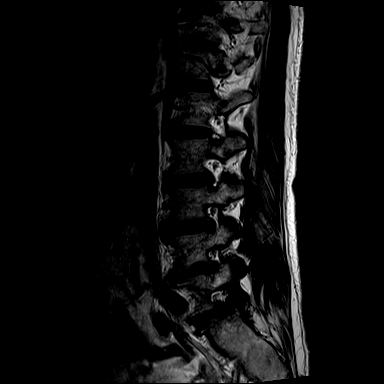
[im 16/16]
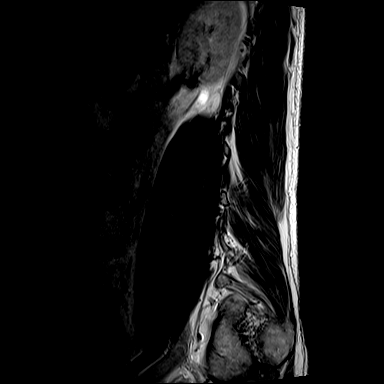

[Series 7: T1 · sagittal · 4.0mm · 0.88mm/px · 7 of 16 slices shown (1 of 2)]
[im 1/16]
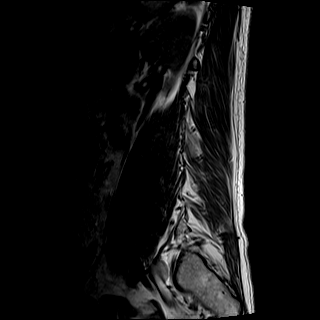
[im 3/16]
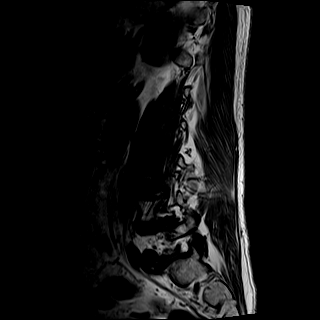
[im 6/16]
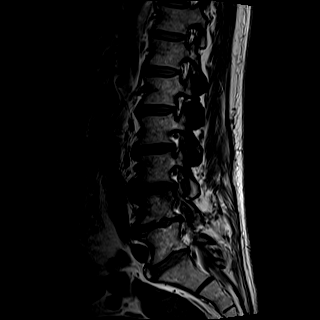
[im 8/16]
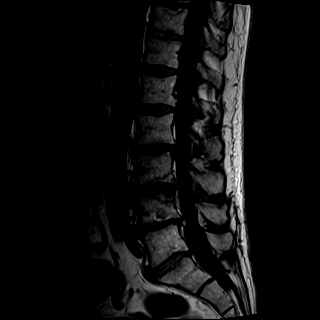
[im 11/16]
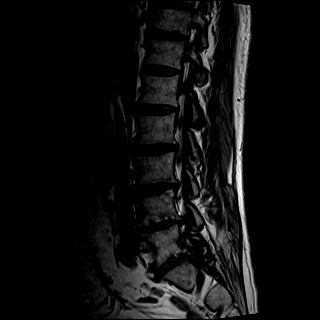
[im 13/16]
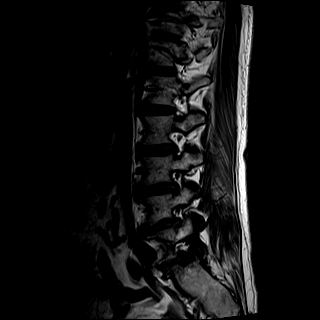
[im 16/16]
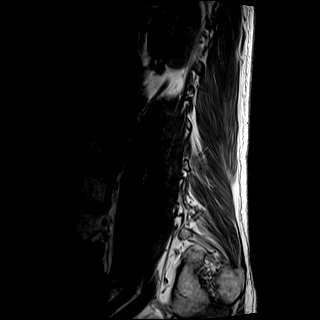

[Series 8: T2 · axial · 4.0mm · 0.57mm/px · z∈[-98,+106]mm · 8 of 32 slices shown (2 of 2)]
[im 1/32]
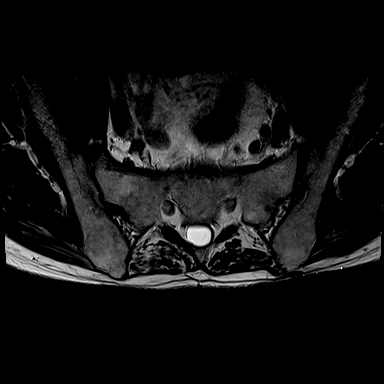
[im 5/32]
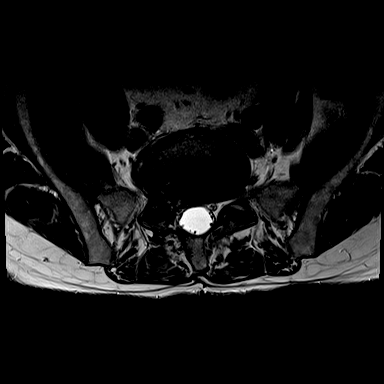
[im 10/32]
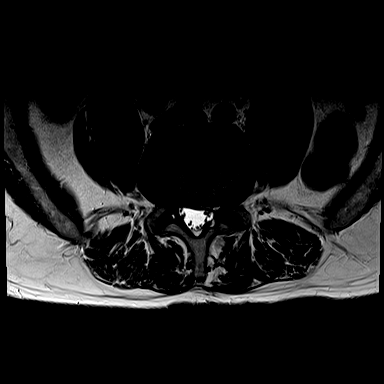
[im 15/32]
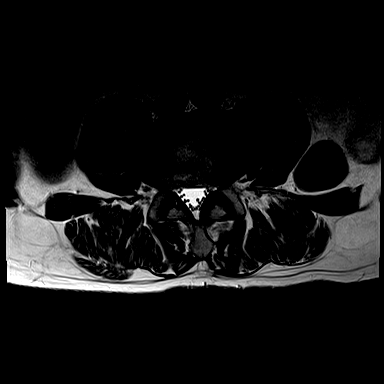
[im 17/32]
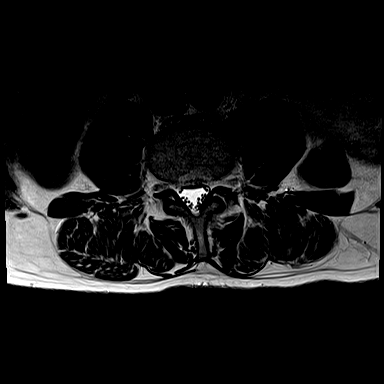
[im 22/32]
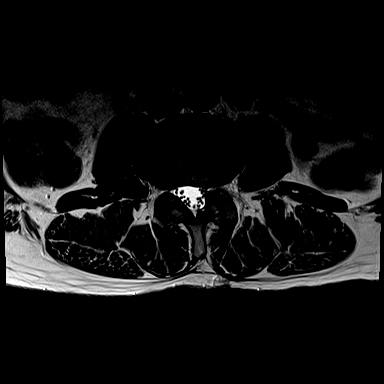
[im 27/32]
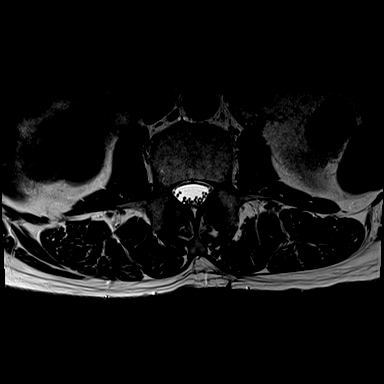
[im 32/32]
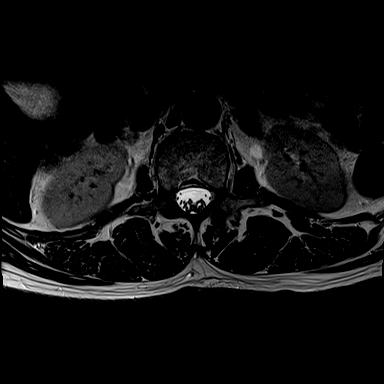

[Series 9: T1 · axial · 4.0mm · 0.34mm/px · z∈[-98,+81]mm · 5 of 32 slices shown (2 of 2)]
[im 1/32]
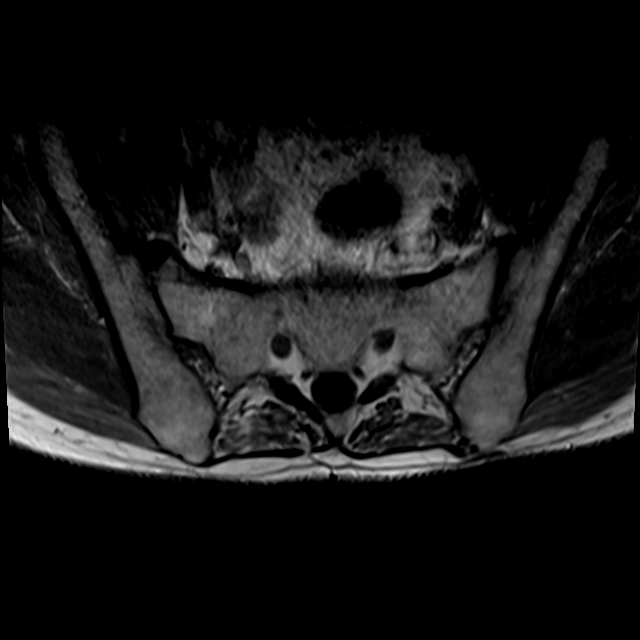
[im 5/32]
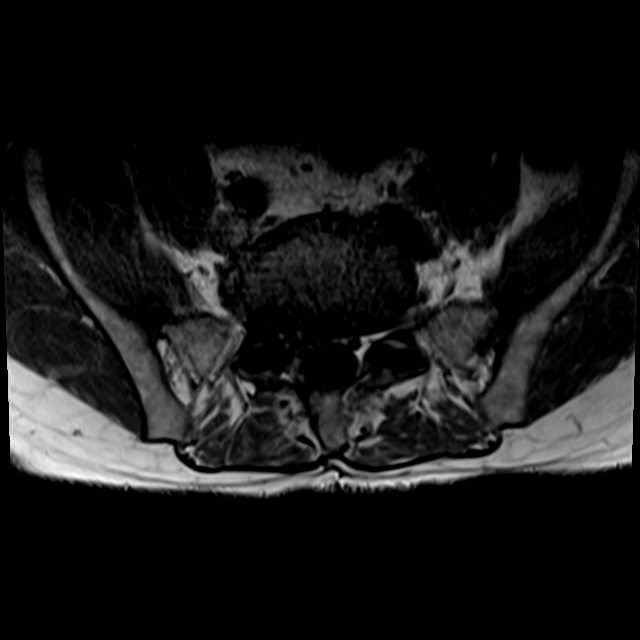
[im 10/32]
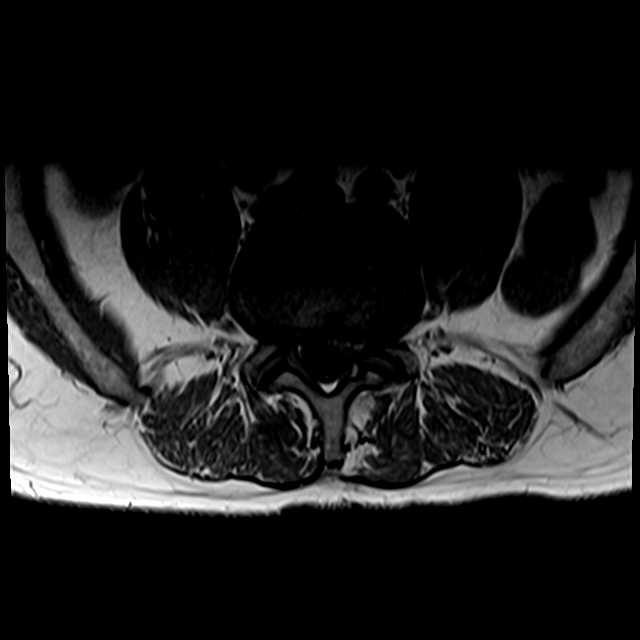
[im 17/32]
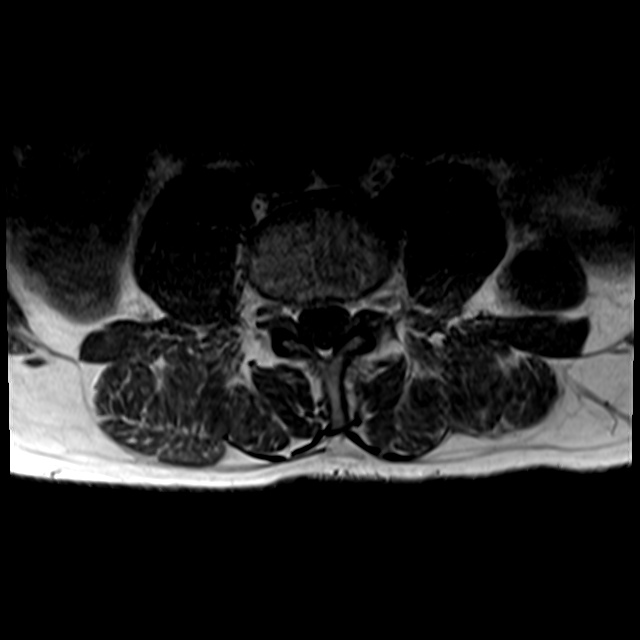
[im 27/32]
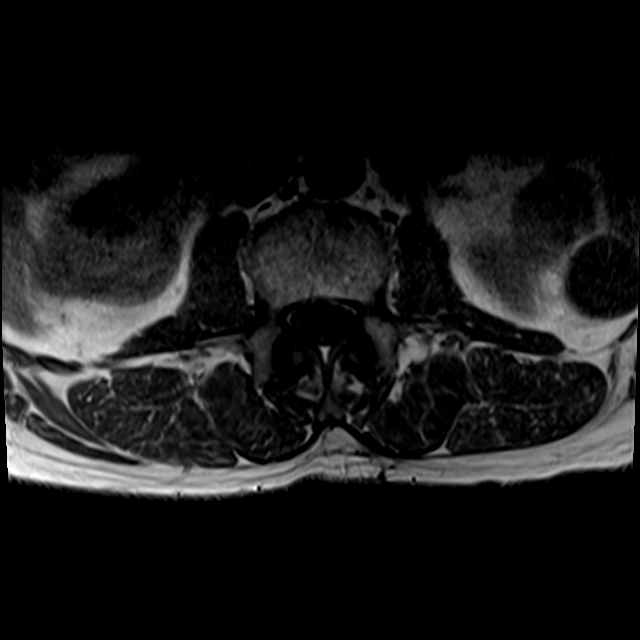

[26 of 48 positions shown; findings below may reference images not displayed]

FINDINGS: Segmentation: Normal. The lowest disc space is considered to be
L5-S1.

Alignment:  Normal

Vertebrae: No acute compression fracture, discitis-osteomyelitis of
focal marrow lesion.

Conus medullaris and cauda equina: The conus medullaris terminates
at the L1 level. The cauda equina and conus medullaris are both
normal.

Paraspinal and other soft tissues: The visualized aorta, IVC and
iliac vessels are normal. The visualized retroperitoneal organs and
paraspinal soft tissues are normal.

Disc levels: Sagittal plane imaging includes the T10-11 disc level
through the upper sacrum, with axial imaging of the L1-2 to L5-S1
disc levels.

T10-11: Normal.

T11-12: Disc desiccation without spinal canal or neural foraminal
stenosis.

T12-L1: Normal.

L1-2: Normal.

L2-3: Left eccentric disc bulge with narrowing of the left lateral
recess. No spinal canal or neural foraminal stenosis.

L3-4: Diffuse disc bulge with mild bilateral lateral recess
narrowing. No spinal canal or neural foraminal stenosis.

L4-5: Disc space narrowing with left eccentric disc bulge and
superimposed left subarticular extrusion with inferior migration,
causing left lateral recess stenosis and displacement of the
descending left L5 nerve root. Mild right and moderate left neural
foraminal stenosis.

L5-S1: Right eccentric disc bulge with endplate spurring. Severe
right neural foraminal stenosis. No spinal canal or left neural
foraminal stenosis. Prior right laminectomy.

The visualized portion of the sacrum is normal.
IMPRESSION: 1. Severe right L5-S1 neural foraminal stenosis secondary to
eccentric disc bulge and endplate osteophyte formation.
2. L4-5 disc bulge with superimposed left subarticular extrusion
causing left lateral recess stenosis and displacement of the
descending left L5 nerve root. There is also mild right and moderate
left foraminal stenosis at this level.

## 2020-06-01 IMAGING — CT CT RENAL STONE PROTOCOL
2 of 4 series · 16 of 46 positions shown, 18 images · non-contrast
Comparison: CT 09/30/2010

CLINICAL DATA: Flank pain

EXAM:
CT ABDOMEN AND PELVIS WITHOUT CONTRAST
TECHNIQUE: Multidetector CT imaging of the abdomen and pelvis was performed
following the standard protocol without IV contrast.

[Series 3: ap without · axial · non-contrast · 0.70mm/px · z∈[-521,-141]mm · 13 of 84 slices shown, 15 images]
[im 4/84  soft-tissue]
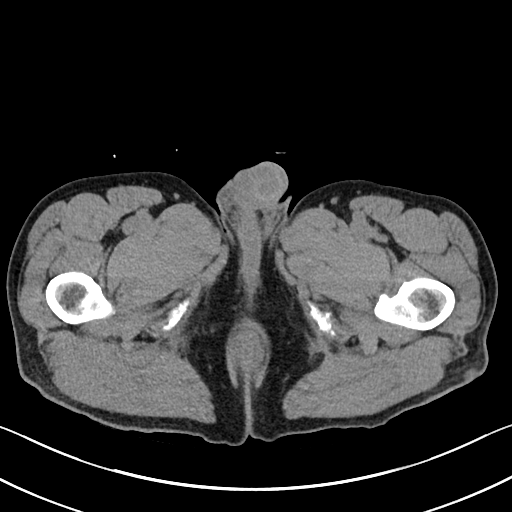
[im 4/84  bone]
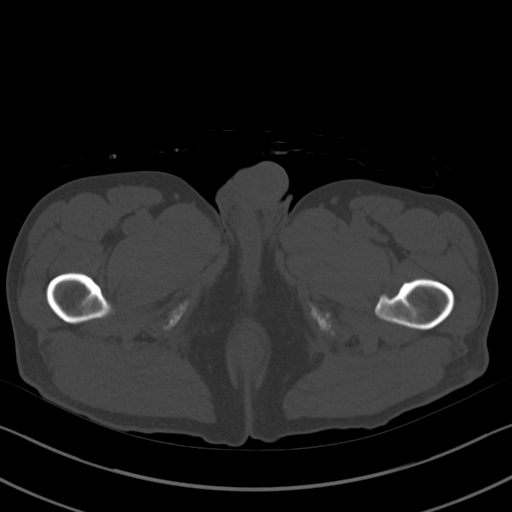
[im 12/84  soft-tissue]
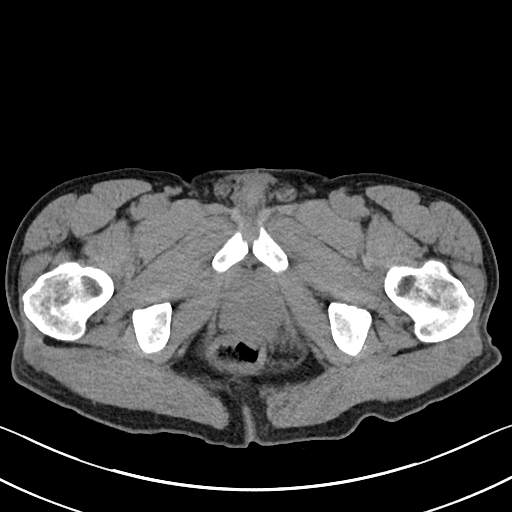
[im 16/84  soft-tissue]
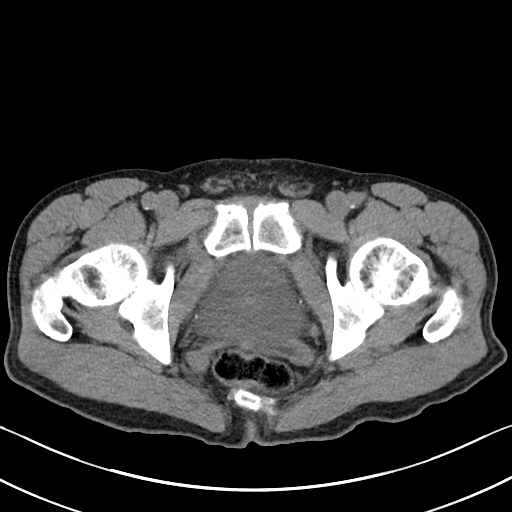
[im 24/84  soft-tissue]
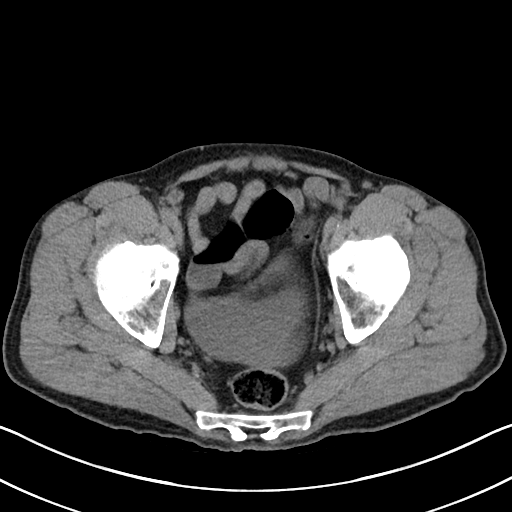
[im 28/84  soft-tissue]
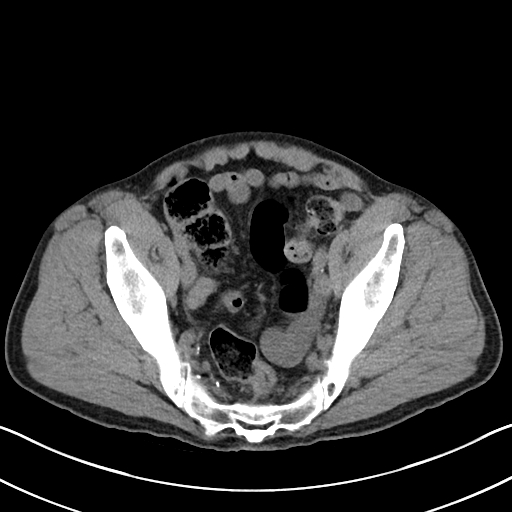
[im 36/84  soft-tissue]
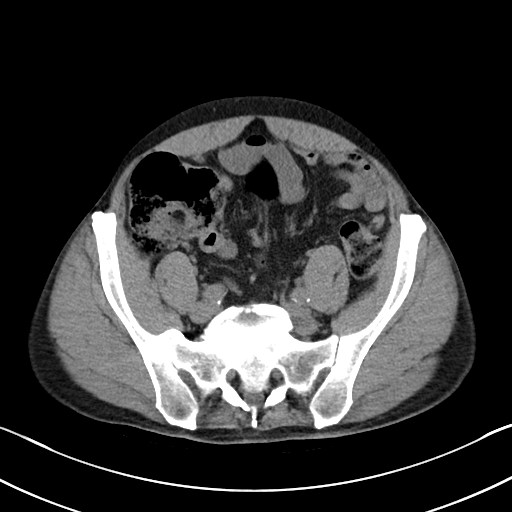
[im 44/84  soft-tissue]
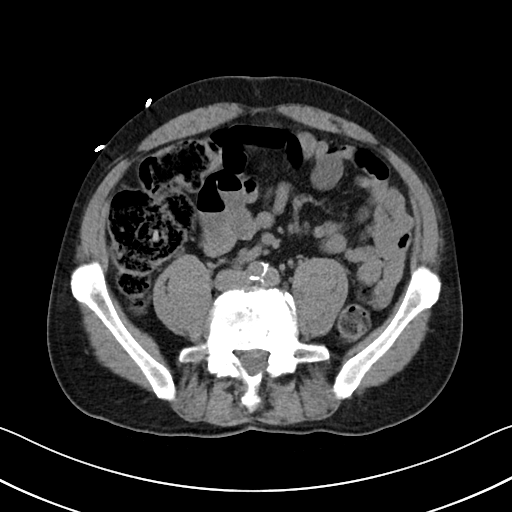
[im 48/84  soft-tissue]
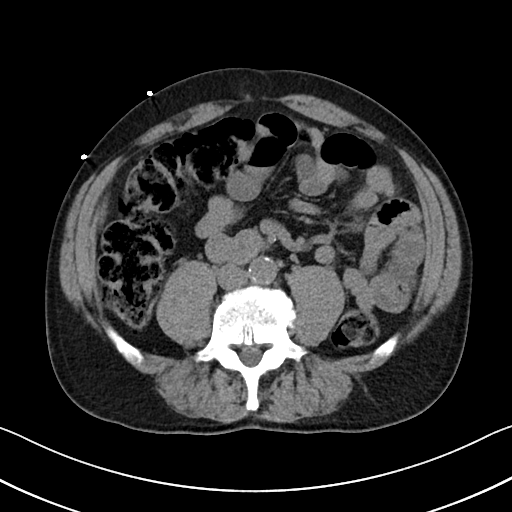
[im 56/84  soft-tissue]
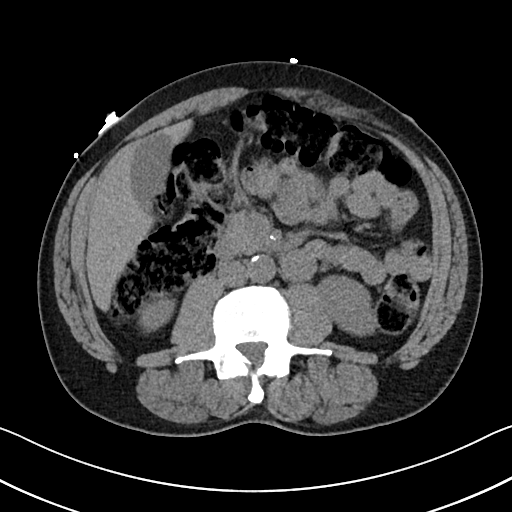
[im 56/84  bone]
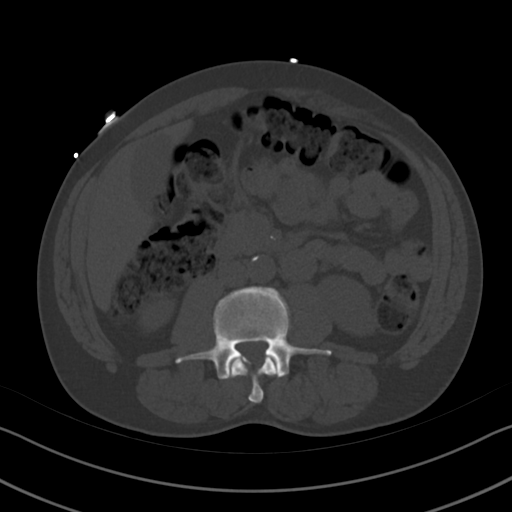
[im 60/84  soft-tissue]
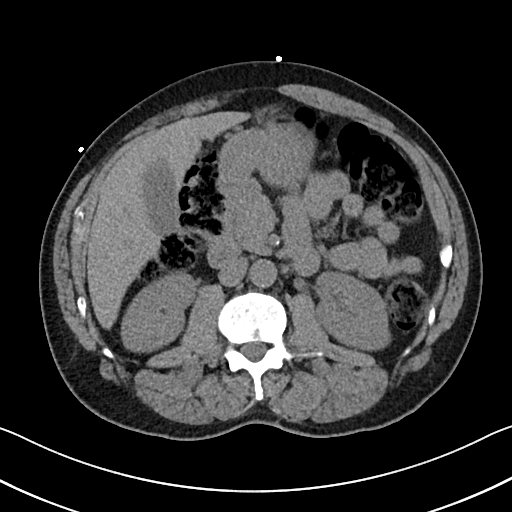
[im 68/84  soft-tissue]
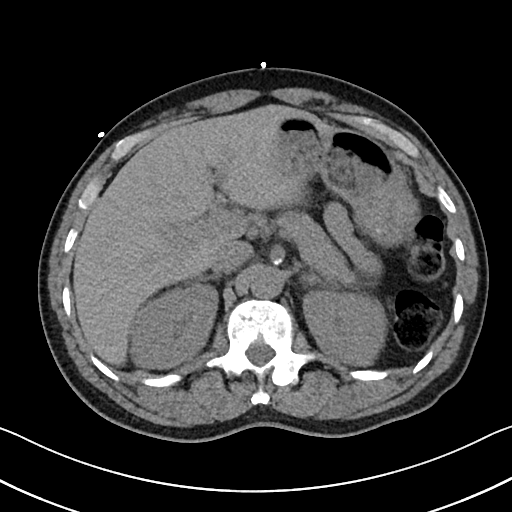
[im 72/84  soft-tissue]
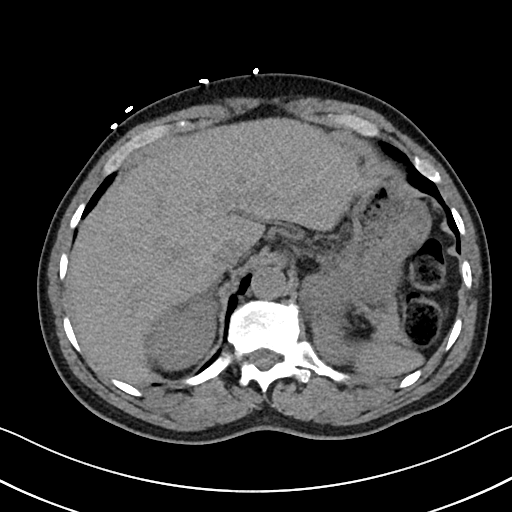
[im 80/84  soft-tissue]
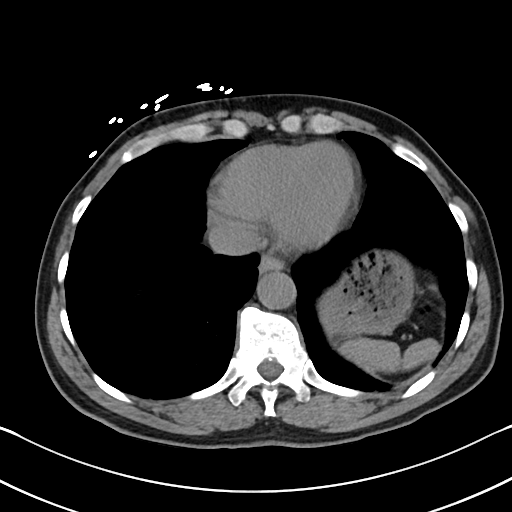

[Series 6: cor · coronal · 0.65mm/px · 3 of 101 slices shown]
[im 34/101  soft-tissue]
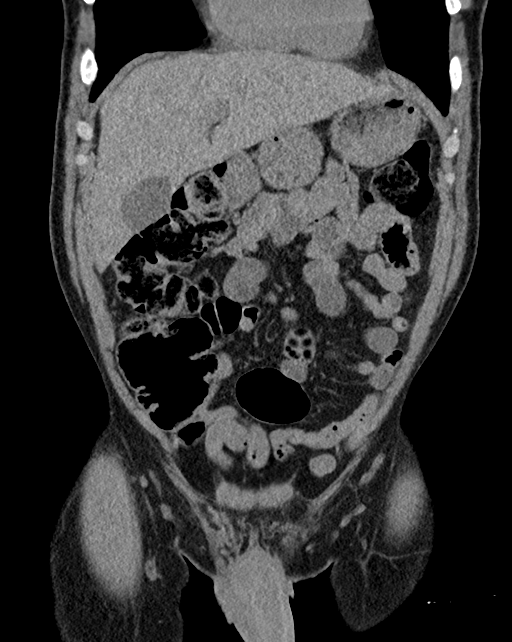
[im 45/101  soft-tissue]
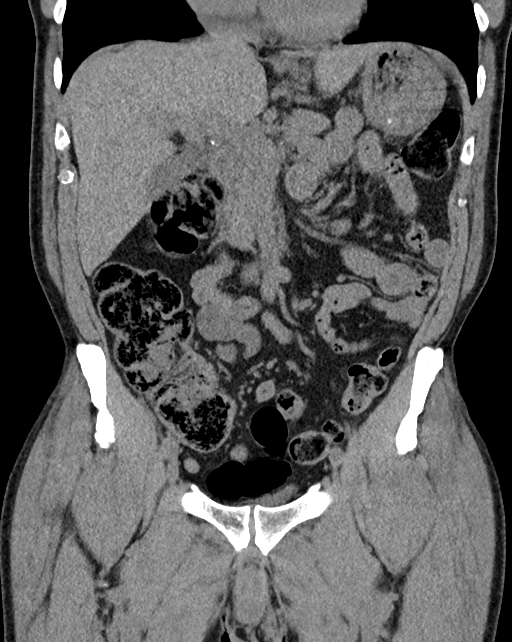
[im 56/101  soft-tissue]
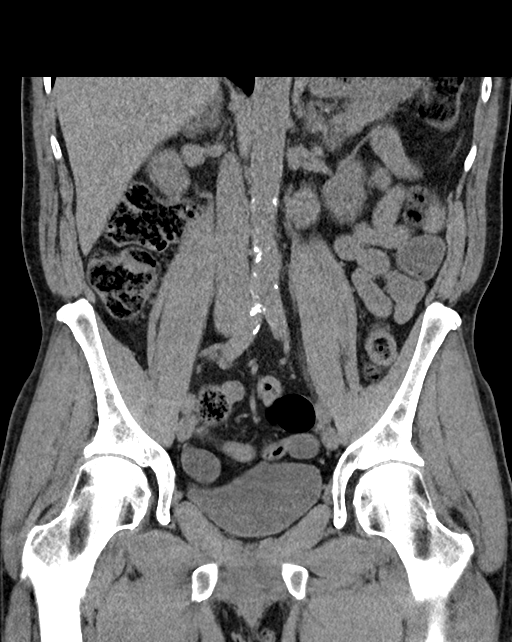

[16 of 46 positions shown; findings below may reference images not displayed]

FINDINGS: Lower chest: Lung bases demonstrate emphysema. No acute
consolidation or effusion. Normal heart size.

Hepatobiliary: No focal liver abnormality is seen. No gallstones,
gallbladder wall thickening, or biliary dilatation.

Pancreas: Unremarkable. No pancreatic ductal dilatation or
surrounding inflammatory changes.

Spleen: Normal in size without focal abnormality.

Adrenals/Urinary Tract: Adrenal glands are unremarkable. Kidneys are
normal, without renal calculi, focal lesion, or hydronephrosis.
Bladder is unremarkable.

Stomach/Bowel: Stomach is within normal limits. No evidence of bowel
wall thickening, distention, or inflammatory changes.

Vascular/Lymphatic: Moderate aortic atherosclerosis. No aneurysm. No
significantly enlarged lymph nodes.

Reproductive: Prostate is unremarkable.

Other: Negative for free air or free fluid. Small fat containing
supraumbilical ventral hernia.

Musculoskeletal: Degenerative changes without acute or suspicious
abnormality
IMPRESSION: 1. Negative for hydronephrosis or ureteral stone.
2. Basilar emphysema
3. Small fat containing supraumbilical ventral hernia

## 2020-06-02 ENCOUNTER — Other Ambulatory Visit: Payer: Self-pay | Admitting: Internal Medicine

## 2020-06-02 DIAGNOSIS — Z8673 Personal history of transient ischemic attack (TIA), and cerebral infarction without residual deficits: Secondary | ICD-10-CM

## 2020-06-02 NOTE — Telephone Encounter (Signed)
Declined refill on hctz as it was stopped previously. Please scheduled patient for regular follow up appointment with PCP next month.

## 2020-07-13 ENCOUNTER — Encounter: Payer: Medicare Other | Admitting: Internal Medicine

## 2020-07-20 ENCOUNTER — Encounter: Payer: Self-pay | Admitting: Internal Medicine

## 2020-07-20 ENCOUNTER — Ambulatory Visit (INDEPENDENT_AMBULATORY_CARE_PROVIDER_SITE_OTHER): Payer: Medicare Other | Admitting: Internal Medicine

## 2020-07-20 ENCOUNTER — Other Ambulatory Visit: Payer: Self-pay

## 2020-07-20 VITALS — BP 153/67 | HR 77 | Temp 98.1°F | Ht 71.0 in | Wt 156.6 lb

## 2020-07-20 DIAGNOSIS — M5442 Lumbago with sciatica, left side: Secondary | ICD-10-CM

## 2020-07-20 DIAGNOSIS — G8929 Other chronic pain: Secondary | ICD-10-CM

## 2020-07-20 DIAGNOSIS — I10 Essential (primary) hypertension: Secondary | ICD-10-CM | POA: Diagnosis not present

## 2020-07-20 DIAGNOSIS — J439 Emphysema, unspecified: Secondary | ICD-10-CM

## 2020-07-20 DIAGNOSIS — Z8673 Personal history of transient ischemic attack (TIA), and cerebral infarction without residual deficits: Secondary | ICD-10-CM | POA: Diagnosis not present

## 2020-07-20 DIAGNOSIS — R911 Solitary pulmonary nodule: Secondary | ICD-10-CM

## 2020-07-20 DIAGNOSIS — Z7189 Other specified counseling: Secondary | ICD-10-CM

## 2020-07-20 DIAGNOSIS — E785 Hyperlipidemia, unspecified: Secondary | ICD-10-CM

## 2020-07-20 DIAGNOSIS — I739 Peripheral vascular disease, unspecified: Secondary | ICD-10-CM | POA: Diagnosis not present

## 2020-07-20 DIAGNOSIS — F321 Major depressive disorder, single episode, moderate: Secondary | ICD-10-CM

## 2020-07-20 DIAGNOSIS — R29818 Other symptoms and signs involving the nervous system: Secondary | ICD-10-CM | POA: Diagnosis not present

## 2020-07-20 DIAGNOSIS — R7303 Prediabetes: Secondary | ICD-10-CM

## 2020-07-20 DIAGNOSIS — R4189 Other symptoms and signs involving cognitive functions and awareness: Secondary | ICD-10-CM

## 2020-07-20 LAB — POCT GLYCOSYLATED HEMOGLOBIN (HGB A1C): Hemoglobin A1C: 5.7 % — AB (ref 4.0–5.6)

## 2020-07-20 LAB — GLUCOSE, CAPILLARY: Glucose-Capillary: 96 mg/dL (ref 70–99)

## 2020-07-20 MED ORDER — AMLODIPINE BESYLATE 10 MG PO TABS: 10.0000 mg | ORAL_TABLET | Freq: Every day | ORAL | 11 refills | Status: AC

## 2020-07-20 MED ORDER — ATORVASTATIN CALCIUM 80 MG PO TABS: 80.0000 mg | ORAL_TABLET | Freq: Every day | ORAL | 3 refills | Status: AC

## 2020-07-20 MED ORDER — UMECLIDINIUM-VILANTEROL 62.5-25 MCG/INH IN AEPB: 1.0000 | INHALATION_SPRAY | Freq: Every day | RESPIRATORY_TRACT | 2 refills | Status: AC

## 2020-07-20 MED ORDER — CLOPIDOGREL BISULFATE 75 MG PO TABS: 1.0000 | ORAL_TABLET | Freq: Every day | ORAL | 3 refills | Status: AC

## 2020-07-20 MED ORDER — DULOXETINE HCL 20 MG PO CPEP: 20.0000 mg | ORAL_CAPSULE | Freq: Every day | ORAL | 2 refills | Status: AC

## 2020-07-20 MED ORDER — GABAPENTIN 400 MG PO CAPS: 400.0000 mg | ORAL_CAPSULE | Freq: Two times a day (BID) | ORAL | 1 refills | Status: AC

## 2020-07-20 NOTE — Progress Notes (Signed)
[name]   Office Visit   Patient ID: Stephen [name], male    DOB: 01/06/1951, 70 y.o.   MRN: [id number]   PCP: Stephen Hansen, MD   Subjective:  Stephen [name] is a 65 y.o. year old male with hypertension, PVD, lung nodules, and a history of multiple CVAs who presents for follow up of chronic medical conditions including hypertension, HLD, hx of recurrent CVA, PVD, vascular dementia, and lung nodule.  His last OV with me was in June of 2021 for evaluation of shortness of breath, chest pain and flank pain and was subsequently referred to the ED.  Interm history He was admitted in July 2021 for stroke recrudescence in the setting of hypotension and was discharged to [facility]. He was discharged from [place] on 08/14/19. He was readmitted on 7/25 for encephalopathy that was thought to be from excessive marijuana use. He returned to [place] at discharge. He was discharged from [place] again on 8/12.  He presents with his ex-wife at today's visit. Please refer to problem based charting for details of assessment and plan.     ACTIVE MEDICATIONS   Outpatient Medications Prior to Visit  Medication Sig Dispense Refill   atorvastatin (LIPITOR) 80 MG tablet TAKE ONE (1) TABLET BY MOUTH EVERY DAY AT SIX IN THE EVENING 90 tablet 4   bisacodyl (DULCOLAX) 10 MG suppository Place 10 mg rectally daily as needed for moderate constipation. (Patient not taking: Reported on 10/30/2019)     clopidogrel (PLAVIX) 75 MG tablet TAKE 1 TABLET BY MOUTH EVERY DAY 90 tablet 4   gabapentin (NEURONTIN) 400 MG capsule Take 1 capsule (400 mg total) by mouth 3 (three) times daily. 90 capsule 0   Lidocaine (ASPERCREME LIDOCAINE) 4 % PTCH Apply 1 patch topically daily as needed (Pain). (Patient not taking: Reported on 10/30/2019) 30 patch 0   magnesium hydroxide (MILK OF MAGNESIA) 400 MG/5ML suspension Take 30 mLs by mouth daily as needed for mild constipation. (Patient not taking: Reported on 10/30/2019)      pantoprazole (PROTONIX) 20 MG tablet TAKE ONE (1) TABLET BY MOUTH EVERY DAY 30 tablet 0   SODIUM PHOSPHATES RE Place 1 each rectally daily as needed (Constipation). (Patient not taking: Reported on 10/30/2019)     No facility-administered medications prior to visit.     Objective:   BP (!) 153/67 (BP Location: Right Arm, Patient Position: Sitting, Cuff Size: Normal)   Pulse 77   Temp 98.1 F (36.7 C) (Oral)   Ht 5\' 11"  (1.803 m)   Wt 156 lb 9.6 oz (71 kg)   SpO2 100%   BMI 21.84 kg/m  Wt Readings from Last 3 Encounters:  07/20/20 156 lb 9.6 oz (71 kg)  09/23/19 159 lb 4.8 oz (72.3 kg)  09/10/19 153 lb 3.2 oz (69.5 kg)   BP Readings from Last 3 Encounters:  07/20/20 (!) 153/67  09/23/19 137/66  09/10/19 132/72   General: chronically ill gentleman in a wheelchair Cardiac: RRR, no LE edema Pulm: lung sounds are diminished throughout Psych: flat affect, became tearful upon talking about disabilities.  Health Maintenance:   Health Maintenance  Topic Date Due   Zoster Vaccines- Shingrix (1 of 2) Never done   COVID-19 Vaccine (3 - Booster for Moderna series) 09/13/2019   INFLUENZA VACCINE  08/29/2020   TETANUS/TDAP  03/13/2023   COLONOSCOPY (Pts 45-12yrs Insurance coverage will need to be confirmed)  08/29/2026   Hepatitis C Screening  Completed   PNA vac Low Risk  Adult  Completed   HPV VACCINES  Aged Out     Assessment & Plan:   Problem List Items Addressed This Visit       Cardiovascular and Mediastinum   Essential hypertension - Primary (Chronic)    Blood pressure is above goal in the office today--153/67. On chart review, it appears to be running consistently >167mmhg at his visits. He had been on amlodipine and triamterene-hctz in the past which had been discontinued due to stroke recrudescence in the setting of hypotension.  Assessment: given his fraility, hx of hypotension, and cerebral vascular disease, I think a blood pressure goal <130 would be ideal but  <140 is also reasonable.  Plan -start amlodipine 10mg  daily -if blood pressure is not at goal on this, I would use caution with other agents that require closer monitoring as his follow up is not great -BMP today -f/u 3 months. He and his wife are aware to let us know sooner if not tolerating amlodipine       Relevant Medications   atorvastatin (LIPITOR) 80 MG tablet   amLODipine (NORVASC) 10 MG tablet   Other Relevant Orders   CMP14 + Anion Gap (Completed)   CBC no Diff (Completed)   Amb Referral to Clinical Pharmacist   Peripheral vascular disease ([place]) (Chronic)    His weakness and residual stroke symptoms are the precluding factors to activity intolerance. He denies claudication symptoms. Denies easy bleeding, bruising, melena, hematochezia -Continue plavix       Relevant Medications   atorvastatin (LIPITOR) 80 MG tablet   amLODipine (NORVASC) 10 MG tablet     Respiratory   Emphysema/COPD ([facility]) (Chronic)    Not currently on inhaler therapy. He and his wife note that he has been having more shortness of breath over the past year.  Smoking hx >100 pack years. Difficult to illicit exactly how much he is currently smoking. When I asked him if he is still smoking, he answered no. I followed this up by asking when he stopped and he answered "7-9pm last night".  Emphysematous changes have been noted on chest CTs. Plan -start anoro -pharmacist consulted for med teaching -continue to encourage smoking cessation       Relevant Medications   umeclidinium-vilanterol (ANORO ELLIPTA) 62.5-25 MCG/INH AEPB   Other Relevant Orders   Amb Referral to Clinical Pharmacist     Nervous and Auditory   Neurocognitive deficits (Chronic)    Since his ex-wife is no longer living with him, he has been managing medications on his own. This has not been going well. He has not been taking any medication in the past year or so.  -referral placed to our pharmacist to assist with med  management/organizational strategies.         Other   Hyperlipidemia (Chronic)    He has not been taking any medication for around year prior to this visit.  Total cholesterol 223, HLD 43, LDL 164. Plan -resume lipitor 80mg  daily -LDL goal <70       Relevant Medications   atorvastatin (LIPITOR) 80 MG tablet   amLODipine (NORVASC) 10 MG tablet   Other Relevant Orders   Lipid panel (Completed)   Chronic back pain s/p L5-S1 laminectomy (Chronic)    Symptoms have been worsening slightly in the past year however has only been taking gabapentin intermittently during this time. -resume gabapentin, use cautiously in the setting of age, fragility -cymbalta also started at today's visit, primarily for depression treatment however may also help  with neuropathy.       Relevant Medications   gabapentin (NEURONTIN) 400 MG capsule   DULoxetine (CYMBALTA) 20 MG capsule   History of CVA (cerebrovascular accident)    He is very frail appearing at today's visit however, based on him and his ex wife's report, it seems that he has had some recovery from his last stroke.  At the time of our last visit, his ex-wife was living with him as a caregiver. It sounds like they are no longer living together today. They report that, since starting to use a walker around 6 months ago, rather than a cane, he has been ambulatory enough to the point that they are no longer residing together. Denies falling in the past 6 months.        Relevant Medications   atorvastatin (LIPITOR) 80 MG tablet   clopidogrel (PLAVIX) 75 MG tablet   Prediabetes    A1C is 5.7 today. Due to fragility, risk of hypoglycemia, and medication compliance issues, will defer pharmaceutical therapy at this time. Continue to encourage lifestyle modifications. -repeat in one year       Relevant Orders   POC Hbg A1C (Completed)   Glucose, capillary (Completed)   Lung nodule    Chest CT ordered at today's visit for lung nodule follow  up.       Relevant Orders   CT Chest Wo Contrast   Major depressive disorder    PHQ 9 score was 10 today. After asking his wife to step out for a few minutes, he reveals to me that he relates the depression to his poor functional status. After his stroke in 2020, he had lost a lot of independence. He continues to struggle with ADLs when he used to be very active.  Sx include troubles sleeping, anhedonia, low energy. Denies SI. Plan -start cymbalta 20mg  -appreciate if pharmacy can follow up in 6-8w and titrate up if needed -otherwise we will re-evaluate at time of follow up in 3 months       Relevant Medications   DULoxetine (CYMBALTA) 20 MG capsule   Other Relevant Orders   Amb Referral to Clinical Pharmacist   Counseling regarding advanced directives and goals of care    Leane Para and his wife recognize that he is chronically ill. While staying at [facility] for rehab, he was DNR. Will try to address [place], including MOST form and AD paperwork, at his next office visit.       Follow up in 3 months (goals) -blood pressure f/u -depression follow up -Assumption   Pt discussed with Dr. Marty Heck, MD Internal Medicine Resident PGY-2 [facility] Pager: [phone] 07/21/2020 2:42 PM

## 2020-07-21 DIAGNOSIS — Z7189 Other specified counseling: Secondary | ICD-10-CM

## 2020-07-21 DIAGNOSIS — I700 Atherosclerosis of aorta: Secondary | ICD-10-CM | POA: Insufficient documentation

## 2020-07-21 DIAGNOSIS — J439 Emphysema, unspecified: Secondary | ICD-10-CM | POA: Insufficient documentation

## 2020-07-21 DIAGNOSIS — F329 Major depressive disorder, single episode, unspecified: Secondary | ICD-10-CM | POA: Insufficient documentation

## 2020-07-21 HISTORY — DX: Other specified counseling: Z71.89

## 2020-07-21 LAB — CMP14 + ANION GAP
ALT: 10 IU/L (ref 0–44)
AST: 13 IU/L (ref 0–40)
Albumin/Globulin Ratio: 1.5 (ref 1.2–2.2)
Albumin: 4.6 g/dL (ref 3.7–4.7)
Alkaline Phosphatase: 78 IU/L (ref 44–121)
Anion Gap: 20 mmol/L — ABNORMAL HIGH (ref 10.0–18.0)
BUN/Creatinine Ratio: 15 (ref 10–24)
BUN: 14 mg/dL (ref 8–27)
Bilirubin Total: 0.3 mg/dL (ref 0.0–1.2)
CO2: 21 mmol/L (ref 20–29)
Calcium: 9.8 mg/dL (ref 8.6–10.2)
Chloride: 99 mmol/L (ref 96–106)
Creatinine, Ser: 0.95 mg/dL (ref 0.76–1.27)
Globulin, Total: 3 g/dL (ref 1.5–4.5)
Glucose: 94 mg/dL (ref 65–99)
Potassium: 4 mmol/L (ref 3.5–5.2)
Sodium: 140 mmol/L (ref 134–144)
Total Protein: 7.6 g/dL (ref 6.0–8.5)
eGFR: 86 mL/min/{1.73_m2} (ref >59)

## 2020-07-21 LAB — CBC
Hematocrit: 43.8 % (ref 37.5–51.0)
Hemoglobin: 14.3 g/dL (ref 13.0–17.7)
MCH: 29.5 pg (ref 26.6–33.0)
MCHC: 32.6 g/dL (ref 31.5–35.7)
MCV: 91 fL (ref 79–97)
Platelets: 219 10*3/uL (ref 150–450)
RBC: 4.84 x10E6/uL (ref 4.14–5.80)
RDW: 15 % (ref 11.6–15.4)
WBC: 8.5 10*3/uL (ref 3.4–10.8)

## 2020-07-21 LAB — LIPID PANEL
Chol/HDL Ratio: 5.2 ratio — ABNORMAL HIGH (ref 0.0–5.0)
Cholesterol, Total: 223 mg/dL — ABNORMAL HIGH (ref 100–199)
HDL: 43 mg/dL (ref >39)
LDL Chol Calc (NIH): 164 mg/dL — ABNORMAL HIGH (ref 0–99)
Triglycerides: 90 mg/dL (ref 0–149)
VLDL Cholesterol Cal: 16 mg/dL (ref 5–40)

## 2020-07-21 NOTE — Assessment & Plan Note (Signed)
PHQ 9 score was 10 today. After asking his wife to step out for a few minutes, he reveals to me that he relates the depression to his poor functional status. After his stroke in 2020, he had lost a lot of independence. He continues to struggle with ADLs when he used to be very active.  Sx include troubles sleeping, anhedonia, low energy. Denies SI. Plan -start cymbalta 20mg  -appreciate if pharmacy can follow up in 6-8w and titrate up if needed -otherwise we will re-evaluate at time of follow up in 3 months

## 2020-07-21 NOTE — Assessment & Plan Note (Signed)
Since his ex-wife is no longer living with him, he has been managing medications on his own. This has not been going well. He has not been taking any medication in the past year or so.  -referral placed to our pharmacist to assist with med management/organizational strategies.

## 2020-07-21 NOTE — Assessment & Plan Note (Signed)
Stephen [name] and his wife recognize that he is chronically ill. While staying at [facility] for rehab, he was DNR. Will try to address [place], including MOST form and AD paperwork, at his next office visit.

## 2020-07-21 NOTE — Assessment & Plan Note (Signed)
He has not been taking any medication for around year prior to this visit.  Total cholesterol 223, HLD 43, LDL 164. Plan -resume lipitor 80mg  daily -LDL goal <70

## 2020-07-21 NOTE — Assessment & Plan Note (Signed)
Symptoms have been worsening slightly in the past year however has only been taking gabapentin intermittently during this time. -resume gabapentin, use cautiously in the setting of age, fragility -cymbalta also started at today's visit, primarily for depression treatment however may also help with neuropathy.

## 2020-07-21 NOTE — Assessment & Plan Note (Addendum)
A1C is 5.7 today. Due to fragility, risk of hypoglycemia, and medication compliance issues, will defer pharmaceutical therapy at this time. Continue to encourage lifestyle modifications. -repeat in one year

## 2020-07-21 NOTE — Assessment & Plan Note (Addendum)
His weakness and residual stroke symptoms are the precluding factors to activity intolerance. He denies claudication symptoms. Denies easy bleeding, bruising, melena, hematochezia -Continue plavix

## 2020-07-21 NOTE — Assessment & Plan Note (Signed)
Chest CT ordered at today's visit for lung nodule follow up.

## 2020-07-21 NOTE — Assessment & Plan Note (Addendum)
Not currently on inhaler therapy. He and his wife note that he has been having more shortness of breath over the past year.  Emphysematous changes have been noted on chest CTs. Smoking hx >100 pack years. Difficult to illicit exactly how much he is currently smoking. When I asked him if he is still smoking, he answered no. I followed this up by asking when he stopped and he answered "7-9pm last night", leading me to think that he is still actively smoking. Counseled on complete smoking cessation. He declines pharmaceutical assistance.   Plan -start anoro -pharmacist consulted for med teaching -continue to encourage smoking cessation

## 2020-07-21 NOTE — Assessment & Plan Note (Signed)
He is very frail appearing at today's visit however, based on him and his ex wife's report, it seems that he has had some recovery from his last stroke.  At the time of our last visit, his ex-wife was living with him as a caregiver. It sounds like they are no longer living together today. They report that, since starting to use a walker around 6 months ago, rather than a cane, he has been ambulatory enough to the point that they are no longer residing together. Denies falling in the past 6 months.

## 2020-07-21 NOTE — Assessment & Plan Note (Addendum)
Blood pressure is above goal in the office today--153/67. On chart review, it appears to be running consistently >160mmhg at his visits. He had been on amlodipine and triamterene-hctz in the past which had been discontinued due to stroke recrudescence in the setting of hypotension.  Assessment: given his fraility, hx of hypotension, and cerebral vascular disease, I think a blood pressure goal <130 would be ideal but <140 is also reasonable.  Plan -start amlodipine 10mg  daily -if blood pressure is not at goal on this, I would use caution with other agents that require closer monitoring as his follow up is not great -BMP today -f/u 3 months. He and his wife are aware to let us know sooner if not tolerating amlodipine

## 2020-08-02 NOTE — Progress Notes (Signed)
Internal Medicine Clinic Attending  Case discussed with Dr. Darrick Meigs  At the time of the visit.  We reviewed the resident's history and exam and pertinent patient test results.  I agree with the assessment, diagnosis, and plan of care documented in the resident's note for this at risk older individual with declining health and function, not taking prescribed regimen which will be further explored.  He will likely benefit from case management referral.

## 2020-08-04 ENCOUNTER — Encounter: Payer: Medicare Other | Admitting: Pharmacist

## 2020-08-11 ENCOUNTER — Encounter: Payer: Medicare Other | Admitting: Pharmacist

## 2020-08-15 ENCOUNTER — Other Ambulatory Visit: Payer: Self-pay

## 2020-08-15 ENCOUNTER — Ambulatory Visit (HOSPITAL_COMMUNITY)
Admission: RE | Admit: 2020-08-15 | Discharge: 2020-08-15 | Disposition: A | Discharge status: Home / Self Care | Payer: Medicare Other | Source: Ambulatory Visit | Attending: Internal Medicine | Admitting: Internal Medicine

## 2020-08-15 DIAGNOSIS — R911 Solitary pulmonary nodule: Secondary | ICD-10-CM | POA: Diagnosis not present

## 2020-08-15 DIAGNOSIS — R918 Other nonspecific abnormal finding of lung field: Secondary | ICD-10-CM | POA: Diagnosis not present

## 2020-08-15 DIAGNOSIS — J439 Emphysema, unspecified: Secondary | ICD-10-CM | POA: Diagnosis not present

## 2020-08-15 DIAGNOSIS — I700 Atherosclerosis of aorta: Secondary | ICD-10-CM | POA: Diagnosis not present

## 2020-08-15 NOTE — Addendum Note (Signed)
Addended by: Hulan Fray on: 08/15/2020 07:38 PM   Modules accepted: Orders

## 2020-10-27 ENCOUNTER — Encounter: Payer: Medicare Other | Admitting: Internal Medicine

## 2020-10-27 ENCOUNTER — Telehealth: Payer: Self-pay | Admitting: *Deleted

## 2020-10-27 NOTE — Assessment & Plan Note (Deleted)
Current medications: Duloxetine (Cymbalta) 20mg  Medication Adherence: Yes []   No []  Adverse medication effects: Yes []  No []  PHQ9 SCORE ONLY 07/20/2020 07/14/2019 07/07/2019  PHQ-9 Total Score 10 0 0    No flowsheet data found. Comments:  Plan

## 2020-10-27 NOTE — Assessment & Plan Note (Deleted)
HYPERTENSION FOLLOW-UP: Current medications: amlodipine 10mg  daily Med Adherence: []  Yes    []  No   Assessment: Blood pressure is ** goal in the office today. BP Readings from Last 3 Encounters:  07/20/20 (!) 153/67  09/23/19 137/66  09/10/19 132/72     Plan -continue current management -follow up 3 mo

## 2020-10-27 NOTE — Assessment & Plan Note (Deleted)
COPD follow up: Current medications: anoro Med Adherence:  []  Yes    [x]  No Symptom control: []  Yes    []  No  # of exacerbations in past 5mo: 0 Smoking history: [x]  Yes    []  No  Currently smoking: []  Yes    []  No  Lung cancer screening in the past 12 months:  [x]  Yes--07/2020 CT for nodule follow up showed stable size    []  No  Comments: review of pharmacy dispense report shows that last anoro fill was 06/2020 for 30d supply  Plan:

## 2020-10-27 NOTE — Telephone Encounter (Signed)
SPOKE WITH PATIENT REGARDING HIS MISSED APPOINTMENT TODAY @ 2:15PM. PATIENT WAS INSTRUCTED TO [facility] [phone] TO RESCHEDULE THIS APPOINTMENT.

## 2020-10-27 NOTE — Progress Notes (Deleted)
Office Visit   Patient ID: Stephen [name], male    DOB: 03/13/1950, 70 y.o.   MRN: [id number]   PCP: Mitzi Hansen, MD   Subjective:  Stephen [name] is a 70 y.o. year old {Gender Description:[id number]} who presents for ***. Please refer to problem based charting for assessment and plan.   ACTIVE MEDICATIONS   Outpatient Medications Prior to Visit  Medication Sig   amLODipine (NORVASC) 10 MG tablet Take 1 tablet (10 mg total) by mouth daily.   atorvastatin (LIPITOR) 80 MG tablet Take 1 tablet (80 mg total) by mouth daily.   clopidogrel (PLAVIX) 75 MG tablet Take 1 tablet (75 mg total) by mouth daily.   DULoxetine (CYMBALTA) 20 MG capsule Take 1 capsule (20 mg total) by mouth daily.   gabapentin (NEURONTIN) 400 MG capsule Take 1 capsule (400 mg total) by mouth 2 (two) times daily.   umeclidinium-vilanterol (ANORO ELLIPTA) 62.5-25 MCG/INH AEPB Inhale 1 puff into the lungs daily at 6 (six) AM.   No facility-administered medications prior to visit.     Objective:   There were no vitals taken for this visit. Wt Readings from Last 3 Encounters:  07/20/20 156 lb 9.6 oz (71 kg)  09/23/19 159 lb 4.8 oz (72.3 kg)  09/10/19 153 lb 3.2 oz (69.5 kg)    BP Readings from Last 3 Encounters:  07/20/20 (!) 153/67  09/23/19 137/66  09/10/19 132/72     Health Maintenance:   Health Maintenance  Topic Date Due   Zoster Vaccines- Shingrix (1 of 2) Never done   COVID-19 Vaccine (3 - Booster for Moderna series) 09/13/2019   INFLUENZA VACCINE  08/29/2020   TETANUS/TDAP  03/13/2023   COLONOSCOPY (Pts 45-61yrs Insurance coverage will need to be confirmed)  08/29/2026   Hepatitis C Screening  Completed   HPV VACCINES  Aged Out    Assessment & Plan:   Problem List Items Addressed This Visit       Cardiovascular and Mediastinum   Essential hypertension (Chronic)    HYPERTENSION FOLLOW-UP: Current medications: amlodipine 10mg  daily Med Adherence: []  Yes    []  No   Assessment:  Blood pressure is ** goal in the office today. BP Readings from Last 3 Encounters:  07/20/20 (!) 153/67  09/23/19 137/66  09/10/19 132/72     Plan -continue current management -follow up 3 mo         Respiratory   Emphysema/COPD ([place]) - Primary (Chronic)    COPD follow up: Current medications: anoro Med Adherence:  []  Yes    [x]  No Symptom control: []  Yes    []  No  # of exacerbations in past 54mo: 0 Smoking history: [x]  Yes    []  No  Currently smoking: []  Yes    []  No  Lung cancer screening in the past 12 months:  [x]  Yes--07/2020 CT for nodule follow up showed stable size    []  No  Comments: review of pharmacy dispense report shows that last anoro fill was 06/2020 for 30d supply  Plan:           Other   Hyperlipidemia (Chronic)   Major depressive disorder (Chronic)    Current medications: Duloxetine (Cymbalta) 20mg  Medication Adherence: Yes []   No []  Adverse medication effects: Yes []  No []  PHQ9 SCORE ONLY 07/20/2020 07/14/2019 07/07/2019  PHQ-9 Total Score 10 0 0    No flowsheet data found. Comments:  Plan         No follow-ups on file.  Pt discussed with ***  Mitzi Hansen, MD Internal Medicine Resident PGY-3 [facility] 10/27/2020 9:01 AM

## 2020-11-14 ENCOUNTER — Encounter: Payer: Medicare Other | Admitting: Internal Medicine

## 2020-11-17 ENCOUNTER — Encounter: Payer: Self-pay | Admitting: Internal Medicine

## 2020-11-17 ENCOUNTER — Ambulatory Visit (INDEPENDENT_AMBULATORY_CARE_PROVIDER_SITE_OTHER): Payer: Medicare Other | Admitting: Internal Medicine

## 2020-11-17 VITALS — BP 157/60 | HR 51 | Temp 98.0°F | Wt 158.0 lb

## 2020-11-17 DIAGNOSIS — I10 Essential (primary) hypertension: Secondary | ICD-10-CM | POA: Diagnosis not present

## 2020-11-17 DIAGNOSIS — Z0000 Encounter for general adult medical examination without abnormal findings: Secondary | ICD-10-CM

## 2020-11-17 DIAGNOSIS — G8929 Other chronic pain: Secondary | ICD-10-CM | POA: Diagnosis not present

## 2020-11-17 DIAGNOSIS — M5442 Lumbago with sciatica, left side: Secondary | ICD-10-CM

## 2020-11-17 DIAGNOSIS — Z23 Encounter for immunization: Secondary | ICD-10-CM | POA: Diagnosis not present

## 2020-11-17 DIAGNOSIS — R4189 Other symptoms and signs involving cognitive functions and awareness: Secondary | ICD-10-CM

## 2020-11-17 DIAGNOSIS — R29818 Other symptoms and signs involving the nervous system: Secondary | ICD-10-CM | POA: Diagnosis not present

## 2020-11-17 DIAGNOSIS — F321 Major depressive disorder, single episode, moderate: Secondary | ICD-10-CM

## 2020-11-17 DIAGNOSIS — E785 Hyperlipidemia, unspecified: Secondary | ICD-10-CM | POA: Diagnosis not present

## 2020-11-17 DIAGNOSIS — F1290 Cannabis use, unspecified, uncomplicated: Secondary | ICD-10-CM | POA: Insufficient documentation

## 2020-11-17 DIAGNOSIS — I739 Peripheral vascular disease, unspecified: Secondary | ICD-10-CM | POA: Diagnosis not present

## 2020-11-17 DIAGNOSIS — J439 Emphysema, unspecified: Secondary | ICD-10-CM

## 2020-11-17 NOTE — Assessment & Plan Note (Signed)
Continue atorvastatin 80

## 2020-11-17 NOTE — Assessment & Plan Note (Signed)
Flu shot today 

## 2020-11-17 NOTE — Assessment & Plan Note (Signed)
Continue cymbalta 20 daily

## 2020-11-17 NOTE — Assessment & Plan Note (Signed)
Continue gabapentin 400 BID and cymbalta 20

## 2020-11-17 NOTE — Assessment & Plan Note (Signed)
Patient has reportedly not taken medications for two weeks, suspect currently elevated BP is related to nonadherence, do not want to increase medication at this time until medication regimen is straightened out. Partly due to difficulty with executive function and memory, partly due to confusion over which medications to take. I have discarded all the incorrect pill bottles brought into today's visit. - continue amlodipine 10

## 2020-11-17 NOTE — Progress Notes (Signed)
   CC: Follow up  HPI:  [name] is a 70 y.o. PMH noted below, who presents to the [facility] with no complaints. To see the management of his acute and chronic conditions, please refer to the A&P note under the encounters tab.   Past Medical History:  Diagnosis Date   Alcohol abuse    Arthropathy    shoulder   Chest pain    Chronic cough    Foot pain    bilateral   Hyperlipidemia    Hypertension    Intermittent claudication (HCC)    bilateral   Latent tuberculosis    Lumbago    Shoulder pain    Review of Systems:  Negative for claudication, shortness of breath, positive for difficulty with memory  Physical Exam: Gen: Frail elderly male in NAD HEENT: normocephalic atraumatic, MMM, neck supple CV: RRR, no m/r/g   Resp: CTAB, normal WOB  GI: soft, nontender MSK: moves all extremities without difficulty Skin:warm and dry, no LE edema. Neuro: confused, answers questions with prompting Psych: flat affect   Assessment & Plan:   See Encounters Tab for problem based charting.  Patient seen with Dr. Philipp Ovens

## 2020-11-17 NOTE — Assessment & Plan Note (Signed)
Patient was prescribed anoro-ellipta inhaler at last visit. However patient has not reportedly been using this

## 2020-11-17 NOTE — Assessment & Plan Note (Addendum)
Patient scored 11/30 on MoCA. Per wife has trouble with iADLs and has difficulty taking medications. Patient likely has not had medications much over the last two weeks. Suspect difficulty with medication non-adherence is related to cognitive deficits. Based on testing patient meets criteria for dementia. Do not think patient is safely managing his medications by himself and he is at great risk for a stroke if he is unable to adhere to his BP medicine and his plavix. He could greatly benefit from home health services - home health referral for medication assistance

## 2020-11-17 NOTE — Assessment & Plan Note (Signed)
Patient meets criteria for dementia but could be complicated by marijuana use. Did not due UDS but suspect patient is using this. Consider discussing substance use at follow up visit.

## 2020-11-17 NOTE — Assessment & Plan Note (Signed)
Patient limited with exercise due to prior residual stroke symptoms. Denis any claudication. Feet are warm and pulses diminished but present today. -continue plavix

## 2020-11-17 NOTE — Patient Instructions (Addendum)
Stephen [name]  It was a pleasure seeing you in the clinic today.   We talked about your memory difficulties. I placed a referral to have home health come assist you with your medications.   Blood pressure Amlopidine 10 mg daily  2. Cholesterol Atorvastatin 80 mg daily  3. Heart disease Plavix 75 daily  4. Mood Cymbalta 20 daily  5. Pain Gabapentin 400 twice daily  6. Inhaler Once daily  Please call our clinic at [phone] if you have any questions or concerns. The best time to call is Monday-Friday from 9am-4pm, but there is someone available 24/7 at the same number. If you need medication refills, please notify your pharmacy one week in advance and they will send [place] a request.   Thank you for letting us take part in your care. We look forward to seeing you next time!

## 2020-12-14 DIAGNOSIS — E785 Hyperlipidemia, unspecified: Secondary | ICD-10-CM | POA: Diagnosis not present

## 2020-12-14 DIAGNOSIS — I10 Essential (primary) hypertension: Secondary | ICD-10-CM | POA: Diagnosis not present

## 2020-12-14 DIAGNOSIS — G3184 Mild cognitive impairment, so stated: Secondary | ICD-10-CM | POA: Diagnosis not present

## 2020-12-14 DIAGNOSIS — M545 Low back pain, unspecified: Secondary | ICD-10-CM | POA: Diagnosis not present

## 2020-12-14 DIAGNOSIS — J449 Chronic obstructive pulmonary disease, unspecified: Secondary | ICD-10-CM | POA: Diagnosis not present

## 2020-12-14 DIAGNOSIS — I739 Peripheral vascular disease, unspecified: Secondary | ICD-10-CM | POA: Diagnosis not present

## 2020-12-14 DIAGNOSIS — Z9114 Patient's other noncompliance with medication regimen: Secondary | ICD-10-CM | POA: Diagnosis not present

## 2021-02-19 NOTE — Assessment & Plan Note (Deleted)
Current medications: amlodpine 10mg  daily Med Adherence: [x]  Yes, congruent with dispense report    []  No Blood pressure is ** goal in the office today. BP Readings from Last 3 Encounters:  11/17/20 (!) 157/60  07/20/20 (!) 153/67  09/23/19 137/66   -continue current management -follow up 3 mo

## 2021-02-19 NOTE — Progress Notes (Deleted)
[place]   Office Visit   Patient ID: Stephen [name], male    DOB: [birth year], 71 y.o.   MRN: [id number]   PCP: Mitzi Hansen, MD   Subjective:  Stephen [name] is a 50 y.o. year old male with hypertension, PVD, lung nodules, and a history of multiple CVAs who presents for follow up of chronic medical conditions including hypertension, HLD, hx of recurrent CVA, PVD, vascular dementia, and lung nodule.  His last OV with me was in June of 2022.  -Blood pressure was 153/67. Amlodipine started -Anoro started -Experiencing depression; PHQ9 score 10--cymbalta started -LDL 164--had not been taking lipitor -Referred to [facility] for med adherence issues -Chest CT ordered for lung nodule follow up -Instructed to follow up in 3 months  Interm history 07/2020 chest CT showed stable nodule size 10/2020 [facility] visit BP remained elevated--157/60--non-adherent to medications Not using Anoro MoCA score 11/30 Home health ordered  Since that office visit, he has been having home health visits, resulting in improvement in medication adherence.       ACTIVE MEDICATIONS   Outpatient Medications Prior to Visit  Medication Sig Dispense Refill   amLODipine (NORVASC) 10 MG tablet Take 1 tablet (10 mg total) by mouth daily. 30 tablet 11   atorvastatin (LIPITOR) 80 MG tablet Take 1 tablet (80 mg total) by mouth daily. 90 tablet 3   clopidogrel (PLAVIX) 75 MG tablet Take 1 tablet (75 mg total) by mouth daily. 90 tablet 3   DULoxetine (CYMBALTA) 20 MG capsule Take 1 capsule (20 mg total) by mouth daily. 90 capsule 2   gabapentin (NEURONTIN) 400 MG capsule Take 1 capsule (400 mg total) by mouth 2 (two) times daily. 180 capsule 1   umeclidinium-vilanterol (ANORO ELLIPTA) 62.5-25 MCG/INH AEPB Inhale 1 puff into the lungs daily at 6 (six) AM. 60 each 2   No facility-administered medications prior to visit.     Objective:   There were no vitals taken for this visit. Wt Readings from Last 3 Encounters:   11/17/20 158 lb (71.7 kg)  07/20/20 156 lb 9.6 oz (71 kg)  09/23/19 159 lb 4.8 oz (72.3 kg)   BP Readings from Last 3 Encounters:  11/17/20 (!) 157/60  07/20/20 (!) 153/67  09/23/19 137/66   General: chronically ill gentleman in a wheelchair Cardiac: RRR, no LE edema Pulm: lung sounds are diminished throughout Psych: flat affect, became tearful upon talking about disabilities.  Health Maintenance:   Health Maintenance  Topic Date Due   Zoster Vaccines- Shingrix (1 of 2) Never done   COVID-19 Vaccine (3 - Booster for Moderna series) 06/08/2019   TETANUS/TDAP  03/13/2023   COLONOSCOPY (Pts 45-17yrs Insurance coverage will need to be confirmed)  08/29/2026   Pneumonia Vaccine 64+ Years old  Completed   INFLUENZA VACCINE  Completed   Hepatitis C Screening  Completed   HPV VACCINES  Aged Out     Assessment & Plan:   Problem List Items Addressed This Visit   None   Follow up in 3 months (goals) -blood pressure f/u -depression follow up -[place]   Pt discussed with Dr. Marty Heck, MD Internal Medicine Resident PGY-2 [facility] Pager: [phone] 02/19/2021 10:29 AM

## 2021-02-20 ENCOUNTER — Encounter: Payer: Medicare Other | Admitting: Internal Medicine

## 2021-02-20 DIAGNOSIS — I10 Essential (primary) hypertension: Secondary | ICD-10-CM

## 2021-04-12 ENCOUNTER — Encounter: Payer: Medicare Other | Admitting: Internal Medicine

## 2021-04-12 ENCOUNTER — Encounter: Payer: Self-pay | Admitting: Internal Medicine

## 2021-05-09 ENCOUNTER — Ambulatory Visit (INDEPENDENT_AMBULATORY_CARE_PROVIDER_SITE_OTHER): Payer: Medicare Other | Admitting: Internal Medicine

## 2021-05-09 VITALS — BP 169/79 | HR 64 | Temp 97.7°F | Ht 65.0 in | Wt 156.4 lb

## 2021-05-09 DIAGNOSIS — G8929 Other chronic pain: Secondary | ICD-10-CM | POA: Diagnosis not present

## 2021-05-09 DIAGNOSIS — M5442 Lumbago with sciatica, left side: Secondary | ICD-10-CM

## 2021-05-09 DIAGNOSIS — I10 Essential (primary) hypertension: Secondary | ICD-10-CM | POA: Diagnosis not present

## 2021-05-09 DIAGNOSIS — M79661 Pain in right lower leg: Secondary | ICD-10-CM | POA: Diagnosis not present

## 2021-05-09 DIAGNOSIS — E785 Hyperlipidemia, unspecified: Secondary | ICD-10-CM | POA: Diagnosis not present

## 2021-05-09 DIAGNOSIS — R7303 Prediabetes: Secondary | ICD-10-CM | POA: Diagnosis not present

## 2021-05-09 LAB — POCT GLYCOSYLATED HEMOGLOBIN (HGB A1C): Hemoglobin A1C: 5.5 % (ref 4.0–5.6)

## 2021-05-09 LAB — GLUCOSE, CAPILLARY: Glucose-Capillary: 87 mg/dL (ref 70–99)

## 2021-05-09 MED ORDER — LOSARTAN POTASSIUM 25 MG PO TABS: 25.0000 mg | ORAL_TABLET | Freq: Every day | ORAL | 11 refills | Status: AC

## 2021-05-09 NOTE — Patient Instructions (Addendum)
Stephen [name], ? ?It was a pleasure to meet you today. I am sorry that you are having some pain in the right side of your body but I am glad that your medications for pain, Cymbalta and Neurontin, have slowly but surely been helping your pain now that you are taking it again. ? ?Your blood pressure is still high so I have sent in a prescription for an additional medication called losartan. You will take this in addition to your amlodipine each day. ? ?I will want you to come back in in 2 weeks to recheck your blood pressure after starting this new medication. ? ?I have also had you stop in the lab for tests today of your cholesterol, HbA1c, and kidney function. I will call you if any of these are abnormal. ? ?Remember: If you have any questions or concerns, please call our clinic at [phone] between 9am-5pm and after hours call [phone] and ask for the internal medicine resident on call. If you feel you are having a medical emergency please call 911. ? ?Farrel Gordon, DO ?

## 2021-05-09 NOTE — Progress Notes (Signed)
? ?  CC: R leg pain, follow-up ? ?HPI: ? ?Stephen [name] is a 71 y.o. male with past medical history as detailed below who presents today for follow-up and with additional complaint of R leg pain. Please see problem-based charting for detailed assessment and plan. ? ?Past Medical History:  ?Diagnosis Date  ? Alcohol abuse   ? Arthropathy   ? shoulder  ? Chest pain   ? Chronic cough   ? Counseling regarding advanced directives and goals of care 07/21/2020  ? Foot pain   ? bilateral  ? Hyperlipidemia   ? Hypertension   ? Intermittent claudication (HCC)   ? bilateral  ? Latent tuberculosis   ? Lumbago   ? Shoulder pain   ? ?Review of Systems:   ?Review of Systems  ?Constitutional:  Negative for chills and fever.  ?Cardiovascular:  Negative for chest pain, palpitations and leg swelling.  ?Musculoskeletal:  Positive for back pain, joint pain and myalgias. Negative for falls.  ?Neurological:  Negative for sensory change and focal weakness.  ? ?Physical Exam: ? ?Vitals:  ? 05/09/21 1451 05/09/21 1514  ?BP: (!) 162/82 (!) 169/79  ?Pulse: 63 64  ?Temp: 97.7 ?F (36.5 ?C)   ?TempSrc: Oral   ?SpO2: 100%   ?Weight: 156 lb 6.4 oz (70.9 kg)   ?Height: '5\' 5"'$  (1.651 m)   ? ?Constitutional:Elderly gentleman in wheelchair, no acute distress. ?Cardio:Regular rate and rhythm. No murmurs, rubs, gallops. ?Pulm:Clear to auscultation bilaterally. Normal work of breathing on room air. ?SAY:[id number] for extremity edema. No deformities noted to extremities or posterior thorax. Point tenderness noted over lateral R posterior thorax without edema or deformities. No spinal tenderness or vertebral step-offs. ?Skin:Warm and dry. ?Neuro: ?Mental Status: ?No signs of aphasia or neglect ?Cranial Nerves: ?V: Facial sensation is symmetric to light touch and temperature. ?VII: Facial movement is symmetric.  ?VIII: Hearing is intact to voice ?XI: Shoulder shrug is symmetric. ?XII: Tongue is midline without atrophy or fasciculations.  ?Motor: Normal  effort thorughout, at least 5/5 bilateral UE, 5/5 bilateral LE ?Sensory: Sensation is grossly intact and equal bilateral UE & LE ?Psych:Pleasant mood and affect. ? ?Assessment & Plan:  ? ?See Encounters Tab for problem based charting. ? ?Patient discussed with Dr. Jimmye Norman ? ? ?

## 2021-05-10 ENCOUNTER — Encounter: Payer: Self-pay | Admitting: Internal Medicine

## 2021-05-10 LAB — CMP14 + ANION GAP
ALT: 16 IU/L (ref 0–44)
AST: 21 IU/L (ref 0–40)
Albumin/Globulin Ratio: 1.6 (ref 1.2–2.2)
Albumin: 4.6 g/dL (ref 3.7–4.7)
Alkaline Phosphatase: 85 IU/L (ref 44–121)
Anion Gap: 18 mmol/L (ref 10.0–18.0)
BUN/Creatinine Ratio: 15 (ref 10–24)
BUN: 15 mg/dL (ref 8–27)
Bilirubin Total: <0.2 mg/dL (ref 0.0–1.2)
CO2: 23 mmol/L (ref 20–29)
Calcium: 9.5 mg/dL (ref 8.6–10.2)
Chloride: 102 mmol/L (ref 96–106)
Creatinine, Ser: 0.99 mg/dL (ref 0.76–1.27)
Globulin, Total: 2.8 g/dL (ref 1.5–4.5)
Glucose: 78 mg/dL (ref 70–99)
Potassium: 3.8 mmol/L (ref 3.5–5.2)
Sodium: 143 mmol/L (ref 134–144)
Total Protein: 7.4 g/dL (ref 6.0–8.5)
eGFR: 81 mL/min/{1.73_m2} (ref >59)

## 2021-05-10 LAB — LIPID PANEL
Chol/HDL Ratio: 3 ratio (ref 0.0–5.0)
Cholesterol, Total: 124 mg/dL (ref 100–199)
HDL: 42 mg/dL (ref >39)
LDL Chol Calc (NIH): 70 mg/dL (ref 0–99)
Triglycerides: 56 mg/dL (ref 0–149)
VLDL Cholesterol Cal: 12 mg/dL (ref 5–40)

## 2021-05-10 NOTE — Assessment & Plan Note (Signed)
Patient has chronic back pain and reports that over the last several months he has had pain on the R side of his body, specifically his leg and some specific points on the lateral L posterior thorax. He denies recent trauma or falls onto the areas affected though his wife does say that he is a high fall risk. He uses a walker at home for ambulatory assistance which helps to decrease his fall frequency and risk. He says that the pain hinders his ability to walk and he cannot walk long distances, however he denies new deficits or weakness to the R side of his body. He denies the pain worsening recently, just that it's bothersome. He was recently not taking duloxetine or gabapentin which he has for his chronic pain and recently refilled this medication. Since restarting his chronic pain management he states that the pain has improved some, not totally, but that there is improvement. ?Assessment: I do not feel that his pain is traumatic or neurological in nature. Physical exam is normal with no signs of deformities, edema, and neurological exam is normal and equal to the L side upper and lower extremities. He is able to stand up from the wheelchair on his own but declines taking steps as he is afraid he will fall without his walker.  ?Plan: Continue duloxetine 20 mg daily and gabapentin 400 mg twice daily. Patient is on a very low dose of duloxetine and has room to increase this dose. I counseled he and his wife that we have room to increase the dose, but that with him having just picked up a 90 day supply, it may not be an affordable option. Will plan to increase dose at next visit if pain control is not optimal. ?

## 2021-05-10 NOTE — Assessment & Plan Note (Signed)
Patient has not had a lipid panel check since June 2022. Current management includes atorvastatin 80 mg daily.  ?Assessment: Recheck lipid panel. ?Plan: Check lipid panel today and continue atorvastatin 80 mg daily. Will make management changes if needed based on these results. ? ?Addendum: LDL right at goal at 70. No management changes at this time. ?

## 2021-05-10 NOTE — Progress Notes (Signed)
Internal Medicine Clinic Attending ? ?Case discussed with Dr. Dean  At the time of the visit.  We reviewed the resident?s history and exam and pertinent patient test results.  I agree with the assessment, diagnosis, and plan of care documented in the resident?s note.  ?

## 2021-05-10 NOTE — Assessment & Plan Note (Signed)
Patient was last assessed for diabetes in June 2022, at which time HbA1c was 5.7%. ?Assessment: Patient is appropriate for recheck of HbA1c today. ?Plan: Check HbA1c. ? ?Addendum: HbA1c 5.5%, making patient no longer in the pre-diabetes range.  ?

## 2021-05-10 NOTE — Assessment & Plan Note (Signed)
Patient reports that he did take BP medication today, amlodipine 10 mg daily. He does not check his BP at home. He had previously not been taking his medication and when asked why, he just chuckled and did not provide an actual reason for not taking the medication. His wife who is present says that he began taking all of his medications again last week when his chronic pain medications were refilled, but otherwise describes him as "stubborn" and not willing to comply all of the time. ?Assessment: BP above goal today, 169/79. Patient would benefit from an additional antihypertensive agent given history of CVA and noted cerebral vascular disease. ?Plan:Will start losartan 25 mg daily and check CMP today. Will have patient return for BP recheck in 2 weeks. ? ?Addendum: CMP WNL. Continue plan as above. ?

## 2021-06-28 DIAGNOSIS — R404 Transient alteration of awareness: Secondary | ICD-10-CM | POA: Diagnosis not present

## 2021-06-28 DIAGNOSIS — I469 Cardiac arrest, cause unspecified: Secondary | ICD-10-CM | POA: Diagnosis not present

## 2021-06-29 DIAGNOSIS — 419620001 Death: Secondary | SNOMED CT | POA: Diagnosis not present

## 2021-06-29 DEATH — deceased

## 2021-07-30 ENCOUNTER — Other Ambulatory Visit: Payer: Self-pay | Admitting: Internal Medicine

## 2021-07-30 DIAGNOSIS — Z8673 Personal history of transient ischemic attack (TIA), and cerebral infarction without residual deficits: Secondary | ICD-10-CM

## 2021-07-30 DIAGNOSIS — F321 Major depressive disorder, single episode, moderate: Secondary | ICD-10-CM
